# Patient Record
Sex: Female | Born: 1940 | Race: Black or African American | Hispanic: No | Smoking: Current some day smoker
Health system: Southern US, Community
[De-identification: ages and names within clinical notes are randomized; demographics above are authoritative.]

## PROBLEM LIST (undated history)

## (undated) DIAGNOSIS — L97509 Non-pressure chronic ulcer of other part of unspecified foot with unspecified severity: Secondary | ICD-10-CM

## (undated) DIAGNOSIS — I10 Essential (primary) hypertension: Secondary | ICD-10-CM

## (undated) DIAGNOSIS — E11621 Type 2 diabetes mellitus with foot ulcer: Secondary | ICD-10-CM

## (undated) DIAGNOSIS — I739 Peripheral vascular disease, unspecified: Secondary | ICD-10-CM

## (undated) DIAGNOSIS — M1990 Unspecified osteoarthritis, unspecified site: Secondary | ICD-10-CM

## (undated) DIAGNOSIS — E119 Type 2 diabetes mellitus without complications: Secondary | ICD-10-CM

## (undated) HISTORY — PX: FOOT SURGERY: SHX648

---

## 2006-10-11 ENCOUNTER — Emergency Department (HOSPITAL_COMMUNITY): Admission: EM | Admit: 2006-10-11 | Discharge: 2006-10-11 | Payer: Self-pay | Admitting: Emergency Medicine

## 2006-12-28 ENCOUNTER — Emergency Department (HOSPITAL_COMMUNITY): Admission: EM | Admit: 2006-12-28 | Discharge: 2006-12-28 | Payer: Self-pay | Admitting: Emergency Medicine

## 2007-06-02 ENCOUNTER — Ambulatory Visit (HOSPITAL_COMMUNITY): Admission: RE | Admit: 2007-06-02 | Discharge: 2007-06-02 | Payer: Self-pay | Admitting: Family Medicine

## 2008-07-16 ENCOUNTER — Ambulatory Visit (HOSPITAL_COMMUNITY): Admission: RE | Admit: 2008-07-16 | Discharge: 2008-07-16 | Payer: Self-pay | Admitting: Family Medicine

## 2008-09-04 ENCOUNTER — Emergency Department (HOSPITAL_COMMUNITY): Admission: EM | Admit: 2008-09-04 | Discharge: 2008-09-04 | Payer: Self-pay | Admitting: Emergency Medicine

## 2009-03-08 ENCOUNTER — Emergency Department (HOSPITAL_COMMUNITY): Admission: EM | Admit: 2009-03-08 | Discharge: 2009-03-08 | Payer: Self-pay | Admitting: Emergency Medicine

## 2010-03-03 ENCOUNTER — Emergency Department (HOSPITAL_COMMUNITY)
Admission: EM | Admit: 2010-03-03 | Discharge: 2010-03-03 | Payer: Self-pay | Source: Home / Self Care | Admitting: Emergency Medicine

## 2010-11-23 LAB — URINALYSIS, ROUTINE W REFLEX MICROSCOPIC
Glucose, UA: NEGATIVE mg/dL
Leukocytes, UA: NEGATIVE
Nitrite: NEGATIVE
Protein, ur: 100 mg/dL — AB
Specific Gravity, Urine: 1.02 (ref 1.005–1.030)
Urobilinogen, UA: 0.2 mg/dL (ref 0.0–1.0)

## 2010-11-23 LAB — BASIC METABOLIC PANEL
BUN: 15 mg/dL (ref 6–23)
Calcium: 8.7 mg/dL (ref 8.4–10.5)
Chloride: 108 mEq/L (ref 96–112)
Creatinine, Ser: 2 mg/dL — ABNORMAL HIGH (ref 0.4–1.2)
GFR calc Af Amer: 30 mL/min — ABNORMAL LOW (ref >60)
GFR calc non Af Amer: 25 mL/min — ABNORMAL LOW (ref >60)
Sodium: 142 mEq/L (ref 135–145)

## 2010-11-23 LAB — CBC
Hemoglobin: 11 g/dL — ABNORMAL LOW (ref 12.0–15.0)
MCH: 29.1 pg (ref 26.0–34.0)
Platelets: 204 10*3/uL (ref 150–400)
RBC: 3.77 MIL/uL — ABNORMAL LOW (ref 3.87–5.11)
WBC: 8.3 10*3/uL (ref 4.0–10.5)

## 2010-11-23 LAB — HEPATIC FUNCTION PANEL
ALT: 27 U/L (ref 0–35)
Albumin: 3.2 g/dL — ABNORMAL LOW (ref 3.5–5.2)
Alkaline Phosphatase: 172 U/L — ABNORMAL HIGH (ref 39–117)
Total Bilirubin: 0.7 mg/dL (ref 0.3–1.2)

## 2010-11-23 LAB — DIFFERENTIAL
Eosinophils Absolute: 0.1 10*3/uL (ref 0.0–0.7)
Eosinophils Relative: 1 % (ref 0–5)
Monocytes Absolute: 0.5 10*3/uL (ref 0.1–1.0)
Neutro Abs: 4.9 10*3/uL (ref 1.7–7.7)
Neutrophils Relative %: 59 % (ref 43–77)

## 2010-11-23 LAB — URINE MICROSCOPIC-ADD ON

## 2010-12-14 LAB — URINALYSIS, ROUTINE W REFLEX MICROSCOPIC
Bilirubin Urine: NEGATIVE
Protein, ur: 100 mg/dL — AB
Specific Gravity, Urine: 1.02 (ref 1.005–1.030)
Urobilinogen, UA: 0.2 mg/dL (ref 0.0–1.0)
pH: 6.5 (ref 5.0–8.0)

## 2010-12-14 LAB — URINE MICROSCOPIC-ADD ON

## 2011-06-12 LAB — URINE MICROSCOPIC-ADD ON

## 2011-06-12 LAB — COMPREHENSIVE METABOLIC PANEL
ALT: 19 U/L (ref 0–35)
Albumin: 3.6 g/dL (ref 3.5–5.2)
Calcium: 9.1 mg/dL (ref 8.4–10.5)
Chloride: 92 mEq/L — ABNORMAL LOW (ref 96–112)
GFR calc Af Amer: 28 mL/min — ABNORMAL LOW (ref >60)
Total Bilirubin: 0.7 mg/dL (ref 0.3–1.2)
Total Protein: 7.4 g/dL (ref 6.0–8.3)

## 2011-06-12 LAB — URINALYSIS, ROUTINE W REFLEX MICROSCOPIC
Bilirubin Urine: NEGATIVE
Ketones, ur: NEGATIVE mg/dL
Leukocytes, UA: NEGATIVE
Nitrite: NEGATIVE
Specific Gravity, Urine: >1.03 — ABNORMAL HIGH (ref 1.005–1.030)
Urobilinogen, UA: 0.2 mg/dL (ref 0.0–1.0)
pH: 5.5 (ref 5.0–8.0)

## 2011-06-12 LAB — DIFFERENTIAL
Lymphocytes Relative: 22 % (ref 12–46)
Monocytes Absolute: 1 10*3/uL (ref 0.1–1.0)
Neutro Abs: 12.2 10*3/uL — ABNORMAL HIGH (ref 1.7–7.7)
Neutrophils Relative %: 72 % (ref 43–77)

## 2011-06-12 LAB — CBC
HCT: 44.1 % (ref 36.0–46.0)
MCHC: 32.2 g/dL (ref 30.0–36.0)
Platelets: 224 10*3/uL (ref 150–400)
RBC: 4.87 MIL/uL (ref 3.87–5.11)
RDW: 14.8 % (ref 11.5–15.5)

## 2011-06-12 LAB — LIPASE, BLOOD: Lipase: 19 U/L (ref 11–59)

## 2012-11-02 ENCOUNTER — Other Ambulatory Visit: Payer: Self-pay | Admitting: Internal Medicine

## 2012-11-02 DIAGNOSIS — Z1231 Encounter for screening mammogram for malignant neoplasm of breast: Secondary | ICD-10-CM

## 2012-11-29 ENCOUNTER — Ambulatory Visit: Payer: Self-pay

## 2013-04-18 ENCOUNTER — Emergency Department (HOSPITAL_COMMUNITY)
Admission: EM | Admit: 2013-04-18 | Discharge: 2013-04-18 | Disposition: A | Discharge status: Home / Self Care | Payer: Medicare HMO | Attending: Emergency Medicine | Admitting: Emergency Medicine

## 2013-04-18 ENCOUNTER — Encounter (HOSPITAL_COMMUNITY): Payer: Self-pay | Admitting: Nurse Practitioner

## 2013-04-18 DIAGNOSIS — E1169 Type 2 diabetes mellitus with other specified complication: Secondary | ICD-10-CM | POA: Insufficient documentation

## 2013-04-18 DIAGNOSIS — E11621 Type 2 diabetes mellitus with foot ulcer: Secondary | ICD-10-CM

## 2013-04-18 DIAGNOSIS — F17200 Nicotine dependence, unspecified, uncomplicated: Secondary | ICD-10-CM | POA: Insufficient documentation

## 2013-04-18 DIAGNOSIS — G8929 Other chronic pain: Secondary | ICD-10-CM | POA: Insufficient documentation

## 2013-04-18 DIAGNOSIS — L97509 Non-pressure chronic ulcer of other part of unspecified foot with unspecified severity: Secondary | ICD-10-CM | POA: Insufficient documentation

## 2013-04-18 DIAGNOSIS — Z79899 Other long term (current) drug therapy: Secondary | ICD-10-CM | POA: Insufficient documentation

## 2013-04-18 HISTORY — DX: Type 2 diabetes mellitus without complications: E11.9

## 2013-04-18 MED ORDER — HYDROCODONE-ACETAMINOPHEN 5-325 MG PO TABS
2.0000 | ORAL_TABLET | Freq: Once | ORAL | Status: AC
  Administered 2013-04-18: 2 via ORAL
  Filled 2013-04-18: qty 2

## 2013-04-18 MED ORDER — HYDROCODONE-ACETAMINOPHEN 5-325 MG PO TABS: 1.0000 | ORAL_TABLET | ORAL | Status: DC | PRN

## 2013-04-18 MED ORDER — CLINDAMYCIN HCL 150 MG PO CAPS: 450.0000 mg | ORAL_CAPSULE | Freq: Three times a day (TID) | ORAL | Status: DC

## 2013-04-18 NOTE — ED Provider Notes (Signed)
CSN: [id number]     Arrival date & time 04/18/13  1401 History  This chart was scribed for non-physician practitioner, Abigail Butts, PA-C working with Stephanie Shipper, MD by Frederich Balding, ED scribe. This patient was seen in room TR08C/TR08C and the patient's care was started at 3:14 PM.   Chief Complaint  Patient presents with  . Foot Pain   The history is provided by the patient. No language interpreter was used.    HPI Comments: Stephanie Olson is a 72 y.o. female with h/o chronic foot pain who presents to the Emergency Department complaining of gradual onset, constant right foot pain that started 2 days ago. Pt also has a draining chronic wound on her right foot with associated mild swelling. Pt [facility]. She has run out of her medication for her chronic pain and normally goes there but they told her that they can't see her anymore. She states she soaked her foot in Epson salt baths with no relief. Pt states she has no other associated symptoms. Pt states she had foot surgery in 1990 and it has had the black coloring on it since then.    Past Medical History  Diagnosis Date  . Diabetes mellitus without complication    Past Surgical History  Procedure Laterality Date  . Foot surgery     History reviewed. No pertinent family history. History  Substance Use Topics  . Smoking status: Current Every Day Smoker    Types: Cigarettes  . Smokeless tobacco: Not on file  . Alcohol Use: No   OB History   Grav Para Term Preterm Abortions TAB SAB Ect Mult Living                 Review of Systems  Constitutional: Negative for fever, diaphoresis, appetite change, fatigue and unexpected weight change.  HENT: Negative for mouth sores and neck stiffness.   Eyes: Negative for visual disturbance.  Respiratory: Negative for cough, chest tightness, shortness of breath and wheezing.   Cardiovascular: Negative for chest pain.  Gastrointestinal: Negative for  nausea, vomiting, abdominal pain, diarrhea and constipation.  Endocrine: Negative for polydipsia, polyphagia and polyuria.  Genitourinary: Negative for dysuria, urgency, frequency and hematuria.  Musculoskeletal: Negative for back pain, joint swelling and arthralgias.  Skin: Positive for wound. Negative for rash.  Allergic/Immunologic: Negative for immunocompromised state.  Neurological: Negative for syncope, light-headedness and headaches.  Hematological: Does not bruise/bleed easily.  Psychiatric/Behavioral: Negative for sleep disturbance. The patient is not nervous/anxious.   All other systems reviewed and are negative.    Allergies  Review of patient's allergies indicates no known allergies.  Home Medications   Current Outpatient Rx  Name  Route  Sig  Dispense  Refill  . ALPRAZolam (XANAX) 0.5 MG tablet   Oral   Take 0.5 mg by mouth 2 (two) times daily.         Marland Kitchen lisinopril (PRINIVIL,ZESTRIL) 10 MG tablet   Oral   Take 10 mg by mouth daily.         Marland Kitchen omeprazole (PRILOSEC) 20 MG capsule   Oral   Take 20 mg by mouth daily.         . pravastatin (PRAVACHOL) 40 MG tablet   Oral   Take 40 mg by mouth daily.         . clindamycin (CLEOCIN) 150 MG capsule   Oral   Take 3 capsules (450 mg total) by mouth 3 (three) times daily.  90 capsule   0   . HYDROcodone-acetaminophen (NORCO/VICODIN) 5-325 MG per tablet   Oral   Take 1 tablet by mouth every 4 (four) hours as needed for pain.   15 tablet   0    BP 148/91  Pulse 104  Temp(Src) 98.3 F (36.8 C) (Oral)  Resp 17  Ht 5\' 9"  (1.753 m)  Wt 180 lb (81.647 kg)  BMI 26.57 kg/m2  SpO2 96%  Physical Exam  Nursing note and vitals reviewed. Constitutional: She appears well-developed and well-nourished. No distress.  Awake, alert, nontoxic appearance  HENT:  Head: Normocephalic and atraumatic.  Mouth/Throat: Oropharynx is clear and moist. No oropharyngeal exudate.  Eyes: Conjunctivae are normal. No scleral  icterus.  Neck: Normal range of motion. Neck supple.  Cardiovascular: Normal rate, regular rhythm, normal heart sounds and intact distal pulses.   No murmur heard. Pulmonary/Chest: Effort normal and breath sounds normal. No respiratory distress. She has no wheezes.  Abdominal: Soft. Bowel sounds are normal. She exhibits no mass. There is no tenderness. There is no rebound and no guarding.  Musculoskeletal: Normal range of motion. She exhibits no edema.  Neurological: She is alert.  Speech is clear and goal oriented Moves extremities without ataxia  Skin: Skin is warm and dry. She is not diaphoretic.  15 cm x 20 cm area eschar. 3 cm x 3 cm area of ulceration with clean base. No bleeding present.   Psychiatric: She has a normal mood and affect.    ED Course   Procedures (including critical care time)  DIAGNOSTIC STUDIES: Oxygen Saturation is 96% on RA, normal by my interpretation.    COORDINATION OF CARE: 3:36 PM-Discussed treatment plan which includes refilling some of her medications with pt at bedside and pt agreed to plan. Advised pt to find a PCP and go to the wound clinic to get her foot looked at.   Labs Reviewed - No data to display No results found. 1. Chronic diabetic ulcer of right foot determined by examination     MDM  Stephanie Olson presents with chronic diabetic foot ulcer which is causing her pain today.  Pt's main concern is that the UC where she receives pain medication.  Pt wound does appear chronic and does not currently appear infected, but is open and draining.  Will give clindamycin and refer to wound clinic.  Pt has also been instructed to use the resource guide to find a PCP.  Pt is alert, nontoxic, nonseptic appearing.  Pt tachycardic at triage, but with normal HR on exam.    I explained the diagnosis and have given explicit precautions to return to the ER including for any other new or worsening symptoms. The patient understands and accepts the medical plan  as it's been dictated and I have answered their questions. Discharge instructions concerning home care and prescriptions have been given. The patient is STABLE and is discharged to home in good condition.    [name], PA-C 04/18/13 [place], PA-C 04/18/13 2212

## 2013-04-18 NOTE — ED Notes (Signed)
Pt reports she has run out of pain medication for her chronic foot pain and normally goes to [facility] but they said they cant see her anymore.

## 2013-04-19 NOTE — ED Provider Notes (Signed)
Medical screening examination/treatment/procedure(s) were performed by non-physician practitioner and as supervising physician I was immediately available for consultation/collaboration.   Osvaldo Shipper, MD 04/19/13 [phone]

## 2013-05-10 ENCOUNTER — Encounter (HOSPITAL_BASED_OUTPATIENT_CLINIC_OR_DEPARTMENT_OTHER): Payer: Medicare HMO

## 2013-08-17 ENCOUNTER — Ambulatory Visit: Payer: Medicare HMO | Admitting: Internal Medicine

## 2013-10-25 ENCOUNTER — Encounter (HOSPITAL_BASED_OUTPATIENT_CLINIC_OR_DEPARTMENT_OTHER): Payer: Medicare HMO | Attending: General Surgery

## 2013-11-10 ENCOUNTER — Encounter (HOSPITAL_BASED_OUTPATIENT_CLINIC_OR_DEPARTMENT_OTHER): Payer: Medicare HMO | Attending: General Surgery

## 2013-11-10 DIAGNOSIS — J449 Chronic obstructive pulmonary disease, unspecified: Secondary | ICD-10-CM | POA: Insufficient documentation

## 2013-11-10 DIAGNOSIS — L97509 Non-pressure chronic ulcer of other part of unspecified foot with unspecified severity: Secondary | ICD-10-CM | POA: Insufficient documentation

## 2013-11-10 DIAGNOSIS — J4489 Other specified chronic obstructive pulmonary disease: Secondary | ICD-10-CM | POA: Insufficient documentation

## 2013-11-10 DIAGNOSIS — E1169 Type 2 diabetes mellitus with other specified complication: Secondary | ICD-10-CM | POA: Insufficient documentation

## 2013-11-10 DIAGNOSIS — I10 Essential (primary) hypertension: Secondary | ICD-10-CM | POA: Insufficient documentation

## 2013-11-11 NOTE — Progress Notes (Signed)
[facility]  NAME:  Stephanie, Olson NO.:  [id number]  MEDICAL RECORD NO.:  [id number]      DATE OF BIRTH:  [birth year]  PHYSICIAN:  Judene Companion, M.D.           VISIT DATE:                                  OFFICE VISIT   HISTORY:  [name] is a 73 year old female, who comes to [place] with a diabetic foot ulcer on her right foot.  It is about 1 cm in diameter. It is classified as a Wagner 2.  This lady has a history of hypertension in the past and she is on lisinopril for this.  She also has a history of asthma, type 2 diabetes, and also has a history of tobacco abuse.  Her medications include lisinopril, Xanax, Prilosec, Lantus insulin, and Advair.  She has had a similar problem in the past, which required a skin graft to her right lateral ankle about 10 years ago.  Today, I debrided callus from the ulcer and we are going to treat it with silver collagen and we also cut some felt strips to offload so that this would help this ulcer to epithelialize.  She will come back here in a week and we gave her enough of the collagen to change the dressing every other day.  Her vital signs when she was here showed a blood pressure of 143/88, respirations 18, temperature 97, and she weighs 180 pounds.  Her blood glucose was 125 today.  DIAGNOSIS:  Diabetic ulcer Wagner 2, right foot; type 2 diabetes; hypertension; and chronic obstructive pulmonary disease.     Judene Companion, M.D.     PP/MEDQ  D:  11/10/2013  T:  11/11/2013  Job:  [id number]

## 2013-11-24 LAB — GLUCOSE, CAPILLARY: Glucose-Capillary: 88 mg/dL (ref 70–99)

## 2014-12-23 ENCOUNTER — Encounter (HOSPITAL_COMMUNITY): Payer: Self-pay | Admitting: Emergency Medicine

## 2014-12-23 ENCOUNTER — Emergency Department (HOSPITAL_COMMUNITY): Payer: Medicare HMO

## 2014-12-23 ENCOUNTER — Emergency Department (HOSPITAL_COMMUNITY)
Admission: EM | Admit: 2014-12-23 | Discharge: 2014-12-23 | Disposition: A | Discharge status: Other Acute Inpatient Hospital | Payer: Medicare HMO | Source: Home / Self Care | Attending: Family Medicine | Admitting: Family Medicine

## 2014-12-23 ENCOUNTER — Encounter (HOSPITAL_COMMUNITY): Payer: Self-pay | Admitting: *Deleted

## 2014-12-23 ENCOUNTER — Emergency Department (HOSPITAL_COMMUNITY)
Admission: EM | Admit: 2014-12-23 | Discharge: 2014-12-23 | Disposition: A | Discharge status: Home / Self Care | Payer: Medicare HMO | Attending: Emergency Medicine | Admitting: Emergency Medicine

## 2014-12-23 DIAGNOSIS — E11621 Type 2 diabetes mellitus with foot ulcer: Secondary | ICD-10-CM | POA: Diagnosis present

## 2014-12-23 DIAGNOSIS — Z720 Tobacco use: Secondary | ICD-10-CM | POA: Insufficient documentation

## 2014-12-23 DIAGNOSIS — Z79899 Other long term (current) drug therapy: Secondary | ICD-10-CM | POA: Insufficient documentation

## 2014-12-23 DIAGNOSIS — L97419 Non-pressure chronic ulcer of right heel and midfoot with unspecified severity: Secondary | ICD-10-CM | POA: Insufficient documentation

## 2014-12-23 DIAGNOSIS — L97519 Non-pressure chronic ulcer of other part of right foot with unspecified severity: Secondary | ICD-10-CM

## 2014-12-23 DIAGNOSIS — Z792 Long term (current) use of antibiotics: Secondary | ICD-10-CM | POA: Insufficient documentation

## 2014-12-23 DIAGNOSIS — L97512 Non-pressure chronic ulcer of other part of right foot with fat layer exposed: Secondary | ICD-10-CM | POA: Diagnosis not present

## 2014-12-23 LAB — CBC WITH DIFFERENTIAL/PLATELET
Basophils Absolute: 0.1 10*3/uL (ref 0.0–0.1)
Basophils Relative: 1 % (ref 0–1)
EOS PCT: 2 % (ref 0–5)
Eosinophils Absolute: 0.2 10*3/uL (ref 0.0–0.7)
HCT: 40.8 % (ref 36.0–46.0)
HEMOGLOBIN: 12.8 g/dL (ref 12.0–15.0)
LYMPHS ABS: 3.8 10*3/uL (ref 0.7–4.0)
Lymphocytes Relative: 44 % (ref 12–46)
MCH: 28.3 pg (ref 26.0–34.0)
MCHC: 31.4 g/dL (ref 30.0–36.0)
MCV: 90.3 fL (ref 78.0–100.0)
Monocytes Absolute: 0.7 10*3/uL (ref 0.1–1.0)
Monocytes Relative: 8 % (ref 3–12)
Neutro Abs: 3.9 10*3/uL (ref 1.7–7.7)
Neutrophils Relative %: 45 % (ref 43–77)
Platelets: 246 10*3/uL (ref 150–400)
RBC: 4.52 MIL/uL (ref 3.87–5.11)
RDW: 16.3 % — ABNORMAL HIGH (ref 11.5–15.5)
WBC: 8.6 10*3/uL (ref 4.0–10.5)

## 2014-12-23 LAB — BASIC METABOLIC PANEL
ANION GAP: 11 (ref 5–15)
BUN: 17 mg/dL (ref 6–23)
CO2: 19 mmol/L (ref 19–32)
Calcium: 8.8 mg/dL (ref 8.4–10.5)
Chloride: 106 mmol/L (ref 96–112)
Creatinine, Ser: 1.71 mg/dL — ABNORMAL HIGH (ref 0.50–1.10)
GFR calc non Af Amer: 29 mL/min — ABNORMAL LOW (ref >90)
GFR, EST AFRICAN AMERICAN: 33 mL/min — AB (ref >90)
Glucose, Bld: 69 mg/dL — ABNORMAL LOW (ref 70–99)
Potassium: 4.8 mmol/L (ref 3.5–5.1)
SODIUM: 136 mmol/L (ref 135–145)

## 2014-12-23 MED ORDER — CLINDAMYCIN HCL 150 MG PO CAPS: 300.0000 mg | ORAL_CAPSULE | Freq: Four times a day (QID) | ORAL | Status: DC

## 2014-12-23 MED ORDER — CLINDAMYCIN HCL 300 MG PO CAPS
300.0000 mg | ORAL_CAPSULE | Freq: Once | ORAL | Status: AC
  Administered 2014-12-23: 300 mg via ORAL
  Filled 2014-12-23: qty 1

## 2014-12-23 MED ORDER — VANCOMYCIN HCL IN DEXTROSE 1-5 GM/200ML-% IV SOLN
1000.0000 mg | INTRAVENOUS | Status: DC
  Filled 2014-12-23: qty 200

## 2014-12-23 NOTE — Progress Notes (Signed)
12/23/2014 1755 Attempted call to pt's number to arrange [facility] # [phone]. Number disconnected. Attempted call to son's number, Lonzo Cloud [phone] and number is non-working number. Attemtped call to son, Montel Clock (Son)# [phone], does not have voice mail. Will try to reach pt on 4/18 to arrange [facility] Jonnie Finner RN CCM Case Mgmt phone [phone]

## 2014-12-23 NOTE — Progress Notes (Signed)
Received request from emergency room to consider patient for admission. Chart reviewed including EDP note with extensive photographs of patient's foot wounds. Laboratory data and x-ray data also reviewed. Discussed at length with my physician who also reviewed the chart. Felt at this juncture patient did not meet criteria for acute admission and called back emergency room physician in charge and recommended orthopedic review/evaluation to determine if orthopedic specific inpatient stay required or if this long-standing chronic wound could be managed in the outpatient setting and therefore allow patient to establish with an orthopedic physician since in review of the chart it appears she has not seen anyone at the wound clinic since March 2015. Patient's current diabetes is under control and no acute internal medicine reason as well to admit the patient.  Erin Hearing, ANP

## 2014-12-23 NOTE — Discharge Instructions (Signed)
Please read and follow all provided instructions.  Your diagnoses today include:  1. Diabetic ulcer of right foot associated with type 2 diabetes mellitus    Tests performed today include:  Blood counts and electrolytes - are okay  X-ray of foot - shows signs of infection in the skin, does not show definite infection in the bone, also hints at severe disease of the blood vessels in your lower leg  Vital signs. See below for your results today.   Medications prescribed:   Clindamycin - antibiotic  You have been prescribed an antibiotic medicine: take the entire course of medicine even if you are feeling better. Stopping early can cause the antibiotic not to work.  Take any prescribed medications only as directed.  Home care instructions:  Follow any educational materials contained in this packet.  Our nurse care manager is assisting you in having home health set up for you to manage your wounds.   Follow-up instructions: Please follow-up with your primary care provider in the next 3 days for further evaluation of your symptoms. Please have them assit you in getting set up with wound care.   You will also need to see a vascular surgeon doctor to evaluate the blood vessels in your legs to make sure your foot is getting enough blood. Please call Dr. Stephens Shire office and have an appointment scheduled.   Return instructions:   Please return to the Emergency Department if you experience worsening symptoms.   Return with worsening redness, drainage from the wound, or fever.   Please return if you have any other emergent concerns.  Additional Information:  Your vital signs today were: BP 129/72 mmHg   Pulse 84   Temp(Src) 98.6 F (37 C) (Oral)   Resp 18   Ht 5\' 9"  (1.753 m)   Wt 188 lb (85.276 kg)   BMI 27.75 kg/m2   SpO2 100% If your blood pressure (BP) was elevated above 135/85 this visit, please have this repeated by your doctor within one month. --------------

## 2014-12-23 NOTE — ED Notes (Signed)
Pt reports foot ulcer to right foot, sees a wound care nurse for this. Pt reports yesterday it got worse. Malodorous. Pt reports she has only been eating ice cream. Pt also states she has been soaking it in epsom salts and alcohol.

## 2014-12-23 NOTE — ED Notes (Signed)
Waiting on med from pharmacy 

## 2014-12-23 NOTE — ED Provider Notes (Signed)
CSN: [id number]     Arrival date & time 12/23/14  1016 History   First MD Initiated Contact with Patient 12/23/14 1036     Chief Complaint  Patient presents with  . Foot Ulcer     (Consider location/radiation/quality/duration/timing/severity/associated sxs/prior Treatment) HPI Comments: Patient with history of diabetes - presents with complaint of right foot ulcerations worsening over the past 4 months, but with acutely worsening pain and malodorous discharge recently. Patient was being seen for this 4 months ago at the wound care clinic (per family although no notes to support this, most previous note is actually from 11/2013, wound was 1cm at that time). She has not had any care for the wound since that time. History of remote skin grafting in this area. Family noticed the extent of the wound last night and encouraged her to go to urgent care today. Patient was then transferred to the emergency department for further evaluation. Patient denies fever, nausea or vomiting. She has decreased appetite. She otherwise denies medical complaints. She states that her foot is tender. She continues to ambulate on the foot. She has kept it wrapped at home. No treatments other than soaking in Epsom salts and alcohol. There has been some drainage.  The history is provided by the patient.    Past Medical History  Diagnosis Date  . Diabetes mellitus without complication    Past Surgical History  Procedure Laterality Date  . Foot surgery     No family history on file. History  Substance Use Topics  . Smoking status: Current Every Day Smoker    Types: Cigarettes  . Smokeless tobacco: Not on file  . Alcohol Use: No   OB History    No data available     Review of Systems  Constitutional: Positive for appetite change. Negative for fever.  HENT: Negative for rhinorrhea and sore throat.   Eyes: Negative for redness.  Respiratory: Negative for cough.   Cardiovascular: Negative for chest pain.    Gastrointestinal: Negative for nausea, vomiting, abdominal pain and diarrhea.  Genitourinary: Negative for dysuria.  Musculoskeletal: Positive for myalgias.  Skin: Positive for color change and wound. Negative for rash.  Neurological: Negative for headaches.      Allergies  Review of patient's allergies indicates no known allergies.  Home Medications   Prior to Admission medications   Medication Sig Start Date End Date Taking? Authorizing Provider  ALPRAZolam Duanne Moron) 0.5 MG tablet Take 0.5 mg by mouth 2 (two) times daily.    Historical Provider, MD  clindamycin (CLEOCIN) 150 MG capsule Take 3 capsules (450 mg total) by mouth 3 (three) times daily. 04/18/13   Hannah Muthersbaugh, PA-C  HYDROcodone-acetaminophen (NORCO/VICODIN) 5-325 MG per tablet Take 1 tablet by mouth every 4 (four) hours as needed for pain. 04/18/13   Hannah Muthersbaugh, PA-C  lisinopril (PRINIVIL,ZESTRIL) 10 MG tablet Take 10 mg by mouth daily.    Historical Provider, MD  omeprazole (PRILOSEC) 20 MG capsule Take 20 mg by mouth daily.    Historical Provider, MD  pravastatin (PRAVACHOL) 40 MG tablet Take 40 mg by mouth daily.    Historical Provider, MD   BP 118/89 mmHg  Pulse 94  Temp(Src) 98.6 F (37 C) (Oral)  Resp 18  Ht 5\' 9"  (1.753 m)  Wt 188 lb (85.276 kg)  BMI 27.75 kg/m2  SpO2 99%   Physical Exam  Constitutional: She appears well-developed and well-nourished.  HENT:  Head: Normocephalic and atraumatic.  Eyes: Conjunctivae are normal. Right eye exhibits  no discharge. Left eye exhibits no discharge.  Neck: Normal range of motion. Neck supple.  Cardiovascular: Normal rate, regular rhythm and normal heart sounds.   Pulmonary/Chest: Effort normal and breath sounds normal.  Abdominal: Soft. There is no tenderness.  Neurological: She is alert.  Skin: Skin is warm and dry.  Large triangular shaped ulceration through the subcutaneous skin and fat into the lateral aspect of the right foot. Malodorous. There  is also a smaller ulceration into the subcutaneous tissue on the sole at the base of 4th and 5th toes. See pictures. No active drainage however there is crusting suggestive of recent purulent drainage. Surrounding areas are mildly warm and erythematous.  Psychiatric: She has a normal mood and affect.  Nursing note and vitals reviewed.              ED Course  Procedures (including critical care time) Labs Review Labs Reviewed  CBC WITH DIFFERENTIAL/PLATELET - Abnormal; Notable for the following:    RDW 16.3 (*)    All other components within normal limits  BASIC METABOLIC PANEL - Abnormal; Notable for the following:    Glucose, Bld 69 (*)    Creatinine, Ser 1.71 (*)    GFR calc non Af Amer 29 (*)    GFR calc Af Amer 33 (*)    All other components within normal limits    Imaging Review Dg Foot Complete Right  12/23/2014   CLINICAL DATA:  74 year old female with a history of ulcer on the plantar surface right heel.  EXAM: RIGHT FOOT COMPLETE - 3+ VIEW  COMPARISON:  None.  FINDINGS: No acute fracture line identified.  Soft tissue swelling with subcutaneous gas along the lateral aspect of the fifth metatarsal. No bony erosion or destructive changes.  Osteopenia.  Degenerative changes of the midfoot, interphalangeal joints, and hindfoot. Calcifications of the posterior tibial artery.  IMPRESSION: No acute bony abnormality, with no erosive changes of the fifth metatarsal to suggest osteomyelitis.  Soft tissue swelling and subcutaneous gas overlying the fifth metatarsal, compatible with history of soft tissue wound/ulcer.  Calcifications of the posterior tibial artery, compatible with infrapopliteal disease. If the patient has not yet been evaluated for claudication/CLI, noninvasive testing with ABI, segmental duplex, and segmental pulse volume recording may be considered as well as office based evaluation to assess for wound healing capability.  Signed,  Dulcy Fanny. Earleen Newport, DO  [facility]  [facility]   Electronically Signed   By: Corrie Mckusick D.O.   On: 12/23/2014 12:20     EKG Interpretation None      11:07 AM Patient seen and examined. Work-up initiated. Discussed with Dr. Mingo Amber who will see.  Vital signs reviewed and are as follows: BP 118/89 mmHg  Pulse 94  Temp(Src) 98.6 F (37 C) (Oral)  Resp 18  Ht 5\' 9"  (1.753 m)  Wt 188 lb (85.276 kg)  BMI 27.75 kg/m2  SpO2 99%  12:51 PM Feel consult for admission is best plan of action to receive IV abx, PAD work-up, re-start wound care. Patient does not reliably follow-up with PCP.   1:16 PM Discussed patient with [facility]. They do not feel that this patient has indications for inpatient treatment. Will start on clinda. Will ask case manager for assistance.   2:18 PM Case manager has seen. Arrangements made for home health for assistance with wound care. Case manager will notify PCP of situation. Will d/c to home on clinda. I will provide referral for  vascular surgery and strongly encourage follow-up with them as well as PCP. Case manager to also contact wound care center on behalf of patient.    MDM   Final diagnoses:  Diabetic ulcer of right foot associated with type 2 diabetes mellitus   Patient with chronic diabetic ulcer. Outpatient follow-up, home health, vascular surgery referral as described as above. If she is compliant, she should be able to have her needs addressed as an outpatient. After discussed with hospitalist, no indications for acute inpatient admission identified. Do not feel that urgent orthopedic consultation is necessary at this time. No indication of acute severe infection but will cover with clindamycin given possible purulent drainage while wound care plans and close follow-up established.     Carlisle Cater, PA-C 12/23/14 [place], MD 12/24/14 [phone]

## 2014-12-23 NOTE — ED Notes (Signed)
Pt  Is  A  Diabetic    Takes  Insulin      She  Has  Skin  Breakdown  And  Ulceration  Of the  r  Foot      For  About  1  Month      She  Is  Awake  And  Alert  And  Oriented     Skin is  Warm  And  Dry   Pt  Is  Sitting  Upright on the  Exam table  Speaking in  Complete  sentances

## 2014-12-23 NOTE — ED Provider Notes (Addendum)
CSN: [id number]     Arrival date & time 12/23/14  0912 History   First MD Initiated Contact with Patient 12/23/14 [phone]     Chief Complaint  Patient presents with  . Foot Problem   (Consider location/radiation/quality/duration/timing/severity/associated sxs/prior Treatment) Patient is a 74 y.o. female presenting with lower extremity pain. The history is provided by the patient and a relative.  Foot Pain This is a chronic problem. The current episode started more than 1 week ago (has had treatments on right foot prev, worsening skin breakdown.). The problem has been gradually worsening. The symptoms are aggravated by smoking.    Past Medical History  Diagnosis Date  . Diabetes mellitus without complication    Past Surgical History  Procedure Laterality Date  . Foot surgery     History reviewed. No pertinent family history. History  Substance Use Topics  . Smoking status: Current Every Day Smoker    Types: Cigarettes  . Smokeless tobacco: Not on file  . Alcohol Use: No   OB History    No data available     Review of Systems  Constitutional: Negative.   Musculoskeletal: Positive for gait problem.  Skin: Positive for wound.    Allergies  Review of patient's allergies indicates no known allergies.  Home Medications   Prior to Admission medications   Medication Sig Start Date End Date Taking? Authorizing Provider  ALPRAZolam Duanne Moron) 0.5 MG tablet Take 0.5 mg by mouth 2 (two) times daily.    Historical Provider, MD  clindamycin (CLEOCIN) 150 MG capsule Take 3 capsules (450 mg total) by mouth 3 (three) times daily. 04/18/13   Hannah Muthersbaugh, PA-C  HYDROcodone-acetaminophen (NORCO/VICODIN) 5-325 MG per tablet Take 1 tablet by mouth every 4 (four) hours as needed for pain. 04/18/13   Hannah Muthersbaugh, PA-C  lisinopril (PRINIVIL,ZESTRIL) 10 MG tablet Take 10 mg by mouth daily.    Historical Provider, MD  omeprazole (PRILOSEC) 20 MG capsule Take 20 mg by mouth daily.     Historical Provider, MD  pravastatin (PRAVACHOL) 40 MG tablet Take 40 mg by mouth daily.    Historical Provider, MD   BP 144/88 mmHg  Pulse 88  Temp(Src) 98.5 F (36.9 C) (Oral)  Resp 18  SpO2 97% Physical Exam  Constitutional: She is oriented to person, place, and time. She appears well-developed and well-nourished. No distress.  Musculoskeletal: She exhibits tenderness.  Neurological: She is alert and oriented to person, place, and time.  Skin:  Open exposed necrotic ulceration on right lat foot, tender, blackened.  Nursing note and vitals reviewed.   ED Course  Procedures (including critical care time) Labs Review Labs Reviewed - No data to display  Imaging Review No results found.   MDM   1. Foot ulcer, right, with fat layer exposed    Sent for chronic foot ulcer and prob osteomyelitis, worsening breakdown.    Billy Fischer, MD 12/23/14 [place], MD 12/23/14 [phone]

## 2014-12-23 NOTE — Progress Notes (Signed)
ANTIBIOTIC CONSULT NOTE - INITIAL  Pharmacy Consult for vancomycin Indication: foot ulcer/infection  No Known Allergies  Patient Measurements: Height: 5\' 9"  (175.3 cm) Weight: 188 lb (85.276 kg) IBW/kg (Calculated) : 66.2 Adjusted Body Weight:   Vital Signs: Temp: 98.6 F (37 C) (04/17 1049) Temp Source: Oral (04/17 1049) BP: 130/82 mmHg (04/17 1252) Pulse Rate: 88 (04/17 1252) Intake/Output from previous day:   Intake/Output from this shift:    Labs:  Recent Labs  12/23/14 1055  WBC 8.6  HGB 12.8  PLT 246  CREATININE 1.71*   Estimated Creatinine Clearance: 34.6 mL/min (by C-G formula based on Cr of 1.71). No results for input(s): VANCOTROUGH, VANCOPEAK, VANCORANDOM, GENTTROUGH, GENTPEAK, GENTRANDOM, TOBRATROUGH, TOBRAPEAK, TOBRARND, AMIKACINPEAK, AMIKACINTROU, AMIKACIN in the last 72 hours.   Microbiology: No results found for this or any previous visit (from the past 720 hour(s)).  Medical History: Past Medical History  Diagnosis Date  . Diabetes mellitus without complication     Medications:  Anti-infectives    Start     Dose/Rate Route Frequency Ordered Stop   12/23/14 1300  vancomycin (VANCOCIN) IVPB 1000 mg/200 mL premix     1,000 mg 200 mL/hr over 60 Minutes Intravenous Every 24 hours 12/23/14 1257       Assessment: 60 yof with a history of DM presented to the ED with a foot ulcer. To start empiric vancomycin for treatment. Scr is elevated at 1.71. Pt s afebrile and WBC is WNL.   Vanc 4/17>>  Goal of Therapy:  Vancomycin trough level 10-15 mcg/ml  Plan:  - Vancomycin 1gm IV Q24H - F/u renal fxn, C&S, clinical status and trough at [place], Rande Lawman 12/23/2014,1:00 PM

## 2014-12-23 NOTE — ED Notes (Signed)
Patient transported to X-ray 

## 2014-12-23 NOTE — ED Notes (Signed)
Pharmacy sent second note about missing med.

## 2014-12-25 ENCOUNTER — Other Ambulatory Visit: Payer: Self-pay

## 2014-12-25 ENCOUNTER — Other Ambulatory Visit: Payer: Self-pay | Admitting: Physician Assistant

## 2014-12-25 DIAGNOSIS — L97519 Non-pressure chronic ulcer of other part of right foot with unspecified severity: Secondary | ICD-10-CM

## 2014-12-31 ENCOUNTER — Other Ambulatory Visit: Payer: Medicare HMO

## 2015-01-07 ENCOUNTER — Other Ambulatory Visit: Payer: Medicare HMO

## 2015-01-10 ENCOUNTER — Ambulatory Visit
Admission: RE | Admit: 2015-01-10 | Discharge: 2015-01-10 | Disposition: A | Discharge status: Home / Self Care | Payer: Medicare HMO | Source: Ambulatory Visit | Attending: Physician Assistant | Admitting: Physician Assistant

## 2015-01-10 DIAGNOSIS — L97519 Non-pressure chronic ulcer of other part of right foot with unspecified severity: Secondary | ICD-10-CM

## 2015-01-11 ENCOUNTER — Encounter: Payer: Self-pay | Admitting: Surgery

## 2015-01-14 ENCOUNTER — Ambulatory Visit (INDEPENDENT_AMBULATORY_CARE_PROVIDER_SITE_OTHER): Payer: Medicare HMO | Admitting: Surgery

## 2015-01-14 ENCOUNTER — Other Ambulatory Visit: Payer: Self-pay

## 2015-01-14 VITALS — BP 147/86 | HR 92 | Ht 69.0 in | Wt 185.0 lb

## 2015-01-14 DIAGNOSIS — L97519 Non-pressure chronic ulcer of other part of right foot with unspecified severity: Secondary | ICD-10-CM | POA: Diagnosis not present

## 2015-01-14 DIAGNOSIS — E11621 Type 2 diabetes mellitus with foot ulcer: Secondary | ICD-10-CM

## 2015-01-14 NOTE — Progress Notes (Signed)
Patient name: Stephanie Olson MRN: [id number] DOB: 12-Apr-1941 Sex: female   Referred by: ER  Reason for referral:  Chief Complaint  Patient presents with  . New Evaluation    HISTORY OF PRESENT ILLNESS: This is a very pleasant 74 year old female who is referred today for evaluation of a diabetic foot ulcer on the right.  The patient has a history of a skin graft to the right foot in the 1990s.  She has developed a ulcer around her skin graft which has been present for approximately 1-2 months.  She is recently seen in the emergency department and started on antibiotics.  She has been getting follow-up with the wound center.  Home health comes out and performs dressing changes 3 times a week.  She states that this area is occasionally painful.  She denies fevers or chills.  The patient is a diabetic.  She does not know her last hemoglobin A1c.  She has not been monitoring her blood sugars.  She suffers from hypercholesterolemia which is managed with a statin.  Her hypertension is treated with multiple medications including an ACE inhibitor.  She is a nonsmoker.  She recently had ultrasound studies of her legs which revealed segmental disease.  Past Medical History  Diagnosis Date  . Diabetes mellitus without complication     Past Surgical History  Procedure Laterality Date  . Foot surgery      History   Social History  . Marital Status: Divorced    Spouse Name: N/A  . Number of Children: N/A  . Years of Education: N/A   Occupational History  . Not on file.   Social History Main Topics  . Smoking status: Current Every Day Smoker    Types: Cigarettes  . Smokeless tobacco: Not on file  . Alcohol Use: No  . Drug Use: No  . Sexual Activity: Not on file   Other Topics Concern  . Not on file   Social History Narrative    No family history on file.  Allergies as of 01/14/2015  . (No Known Allergies)    Current Outpatient Prescriptions on File Prior to Visit    Medication Sig Dispense Refill  . ADVAIR DISKUS 250-50 MCG/DOSE AEPB Inhale 1 puff into the lungs daily as needed.  0  . ALPRAZolam (XANAX) 1 MG tablet Take 1 mg by mouth at bedtime as needed.    Marland Kitchen amLODipine (NORVASC) 5 MG tablet Take 5 mg by mouth daily.  0  . LANTUS 100 UNIT/ML injection Take 35 Units by mouth every morning.  0  . lisinopril (PRINIVIL,ZESTRIL) 10 MG tablet Take 10 mg by mouth daily.    . Omega-3 Fatty Acids (FISH OIL PO) Take 1 capsule by mouth daily.    Marland Kitchen omeprazole (PRILOSEC) 20 MG capsule Take 20 mg by mouth daily.    . pravastatin (PRAVACHOL) 20 MG tablet Take 20 mg by mouth at bedtime.  0  . PROAIR HFA 108 (90 BASE) MCG/ACT inhaler Inhale 2 puffs into the lungs every 4 (four) hours as needed.  0  . clindamycin (CLEOCIN) 150 MG capsule Take 2 capsules (300 mg total) by mouth every 6 (six) hours. (Patient not taking: Reported on 01/14/2015) 56 capsule 0  . VITAMIN D, ERGOCALCIFEROL, PO Take 1 capsule by mouth daily.     No current facility-administered medications on file prior to visit.     REVIEW OF SYSTEMS: Please see history of present illness for pertinent positives and negatives.  All other systems are negative.  PHYSICAL EXAMINATION:  Filed Vitals:   01/14/15 0849  BP: 147/86  Pulse: 92  Height: 5\' 9"  (1.753 m)  Weight: 185 lb (83.915 kg)  SpO2: 100%   Body mass index is 27.31 kg/(m^2). General: The patient appears their stated age.   HEENT:  No gross abnormalities Pulmonary: Respirations are non-labored Abdomen: Soft and non-tender  Musculoskeletal: There are no major deformities.   Neurologic: No focal weakness or paresthesias are detected, Skin: Large defect with no open areas on the lateral malleolus consistent with prior skin graft.  On the lateral heel there is a 2 x 2 open ulcer with granulation tissue.  No significant drainage or erythema. Psychiatric: The patient has normal affect. Cardiovascular: There is a regular rate and rhythm without  significant murmur appreciated.  Pedal pulses are not palpable.  No carotid bruit  Diagnostic Studies: I have reviewed her outside ultrasound studies.  Ankle-brachial indices were 0.89 bilaterally with segmental disease.   Assessment:  Right diabetic foot ulcer Plan: I discussed our treatment options with the patient.  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  Her wound has not healed over a two-month period.  Therefore, I think the next step is to proceed with angiography to better define her anatomy and to proceed with intervention if there is a lesion amenable to treatment.  I discussed the risks and benefits of the procedure including the risk of bleeding from access.  We also discussed the risk of distal embolization.  She understands that if we cannot get this wound to heal that she is at risk for amputation.  I have scheduled her procedure for Tuesday, May 17.  I wanted to start her on a baby aspirin today, however she states that this does not agree with her.     Eldridge Abrahams, M.D. [facility] [place] Office: [phone] Pager:  [phone]

## 2015-01-21 ENCOUNTER — Telehealth: Payer: Self-pay | Admitting: *Deleted

## 2015-01-21 NOTE — Telephone Encounter (Signed)
"   Granddaugher" called to triage re: cancelling Mrs. Lumley's AGM tomorrow with Dr. Trula Slade. I tried to call her back for more information but had to leave a message for her to call me back [phone]). I did call the patient but she seemed confused about the situation and could not answer my questions. I will keep trying to contact Toinette, the granddaughter.

## 2015-01-25 ENCOUNTER — Telehealth: Payer: Self-pay

## 2015-01-29 ENCOUNTER — Other Ambulatory Visit: Payer: Self-pay | Admitting: *Deleted

## 2015-01-29 ENCOUNTER — Encounter (HOSPITAL_COMMUNITY)
Admission: RE | Disposition: A | Discharge status: Home / Self Care | Payer: Medicare HMO | Source: Ambulatory Visit | Attending: Surgery

## 2015-01-29 ENCOUNTER — Ambulatory Visit (HOSPITAL_COMMUNITY)
Admission: RE | Admit: 2015-01-29 | Discharge: 2015-01-30 | Disposition: A | Discharge status: Home / Self Care | Payer: Medicare Other | Source: Ambulatory Visit | Attending: Surgery | Admitting: Surgery

## 2015-01-29 DIAGNOSIS — I70235 Atherosclerosis of native arteries of right leg with ulceration of other part of foot: Secondary | ICD-10-CM | POA: Diagnosis not present

## 2015-01-29 DIAGNOSIS — I70233 Atherosclerosis of native arteries of right leg with ulceration of ankle: Secondary | ICD-10-CM | POA: Diagnosis present

## 2015-01-29 DIAGNOSIS — E780 Pure hypercholesterolemia: Secondary | ICD-10-CM | POA: Diagnosis not present

## 2015-01-29 DIAGNOSIS — I10 Essential (primary) hypertension: Secondary | ICD-10-CM | POA: Insufficient documentation

## 2015-01-29 DIAGNOSIS — I739 Peripheral vascular disease, unspecified: Secondary | ICD-10-CM

## 2015-01-29 DIAGNOSIS — E11621 Type 2 diabetes mellitus with foot ulcer: Secondary | ICD-10-CM | POA: Insufficient documentation

## 2015-01-29 DIAGNOSIS — L97319 Non-pressure chronic ulcer of right ankle with unspecified severity: Secondary | ICD-10-CM | POA: Insufficient documentation

## 2015-01-29 HISTORY — PX: ATHERECTOMY: SHX47

## 2015-01-29 HISTORY — DX: Type 2 diabetes mellitus with foot ulcer: E11.621

## 2015-01-29 HISTORY — PX: PERIPHERAL VASCULAR CATHETERIZATION: SHX172C

## 2015-01-29 HISTORY — PX: ABDOMINAL AORTAGRAM: SHX5706

## 2015-01-29 HISTORY — DX: Unspecified osteoarthritis, unspecified site: M19.90

## 2015-01-29 HISTORY — PX: BALLOON ANGIOPLASTY, ARTERY: SHX564

## 2015-01-29 HISTORY — DX: Non-pressure chronic ulcer of other part of unspecified foot with unspecified severity: L97.509

## 2015-01-29 HISTORY — DX: Peripheral vascular disease, unspecified: I73.9

## 2015-01-29 HISTORY — DX: Essential (primary) hypertension: I10

## 2015-01-29 LAB — POCT I-STAT, CHEM 8
BUN: 22 mg/dL — AB (ref 6–20)
Calcium, Ion: 1.19 mmol/L (ref 1.13–1.30)
Chloride: 108 mmol/L (ref 101–111)
Creatinine, Ser: 1.6 mg/dL — ABNORMAL HIGH (ref 0.44–1.00)
GLUCOSE: 99 mg/dL (ref 65–99)
HEMATOCRIT: 41 % (ref 36.0–46.0)
HEMOGLOBIN: 13.9 g/dL (ref 12.0–15.0)
Potassium: 5.2 mmol/L — ABNORMAL HIGH (ref 3.5–5.1)
SODIUM: 141 mmol/L (ref 135–145)
TCO2: 21 mmol/L (ref 0–100)

## 2015-01-29 LAB — GLUCOSE, CAPILLARY
GLUCOSE-CAPILLARY: 108 mg/dL — AB (ref 65–99)
Glucose-Capillary: 80 mg/dL (ref 65–99)
Glucose-Capillary: 95 mg/dL (ref 65–99)
Glucose-Capillary: 141 mg/dL — ABNORMAL HIGH (ref 65–99)

## 2015-01-29 LAB — POCT ACTIVATED CLOTTING TIME
ACTIVATED CLOTTING TIME: 245 s
ACTIVATED CLOTTING TIME: 177 s
Activated Clotting Time: 282 seconds
Activated Clotting Time: 208 seconds
Activated Clotting Time: 196 seconds

## 2015-01-29 SURGERY — ABDOMINAL AORTOGRAM W/LOWER EXTREMITY
Anesthesia: LOCAL

## 2015-01-29 MED ORDER — LISINOPRIL 10 MG PO TABS
10.0000 mg | ORAL_TABLET | Freq: Every day | ORAL | Status: DC
  Administered 2015-01-30: 10 mg via ORAL
  Filled 2015-01-29: qty 1

## 2015-01-29 MED ORDER — SODIUM CHLORIDE 0.9 % IV SOLN
INTRAVENOUS | Status: DC
  Administered 2015-01-29: 12:00:00 via INTRAVENOUS

## 2015-01-29 MED ORDER — ACETAMINOPHEN 325 MG PO TABS: 325.0000 mg | ORAL_TABLET | ORAL | Status: DC | PRN

## 2015-01-29 MED ORDER — HEPARIN (PORCINE) IN NACL 2-0.9 UNIT/ML-% IJ SOLN
INTRAMUSCULAR | Status: AC
  Filled 2015-01-29: qty 1000

## 2015-01-29 MED ORDER — ALUM & MAG HYDROXIDE-SIMETH 200-200-20 MG/5ML PO SUSP: 15.0000 mL | ORAL | Status: DC | PRN

## 2015-01-29 MED ORDER — HYDROCODONE-ACETAMINOPHEN 10-325 MG PO TABS: 1.0000 | ORAL_TABLET | Freq: Four times a day (QID) | ORAL | Status: DC | PRN

## 2015-01-29 MED ORDER — MIDAZOLAM HCL 2 MG/2ML IJ SOLN
INTRAMUSCULAR | Status: DC | PRN
  Administered 2015-01-29: 1 mg via INTRAVENOUS

## 2015-01-29 MED ORDER — MIDAZOLAM HCL 2 MG/2ML IJ SOLN
INTRAMUSCULAR | Status: AC
  Filled 2015-01-29: qty 2

## 2015-01-29 MED ORDER — LABETALOL HCL 5 MG/ML IV SOLN
10.0000 mg | INTRAVENOUS | Status: DC | PRN
  Filled 2015-01-29: qty 4

## 2015-01-29 MED ORDER — HEPARIN SODIUM (PORCINE) 1000 UNIT/ML IJ SOLN
INTRAMUSCULAR | Status: AC
  Filled 2015-01-29: qty 1

## 2015-01-29 MED ORDER — INSULIN GLARGINE 100 UNIT/ML ~~LOC~~ SOLN
35.0000 [IU] | Freq: Every morning | SUBCUTANEOUS | Status: DC
  Administered 2015-01-30: 35 [IU] via SUBCUTANEOUS
  Filled 2015-01-29: qty 0.35

## 2015-01-29 MED ORDER — PANTOPRAZOLE SODIUM 40 MG PO TBEC
40.0000 mg | DELAYED_RELEASE_TABLET | Freq: Every day | ORAL | Status: DC
  Administered 2015-01-29 – 2015-01-30 (×2): 40 mg via ORAL
  Filled 2015-01-29 (×2): qty 1

## 2015-01-29 MED ORDER — OMEGA-3-ACID ETHYL ESTERS 1 G PO CAPS
1.0000 g | ORAL_CAPSULE | Freq: Every day | ORAL | Status: DC
  Administered 2015-01-30: 1 g via ORAL
  Filled 2015-01-29: qty 1

## 2015-01-29 MED ORDER — MOMETASONE FURO-FORMOTEROL FUM 100-5 MCG/ACT IN AERO
2.0000 | INHALATION_SPRAY | Freq: Two times a day (BID) | RESPIRATORY_TRACT | Status: DC
  Administered 2015-01-29 – 2015-01-30 (×2): 2 via RESPIRATORY_TRACT
  Filled 2015-01-29: qty 8.8

## 2015-01-29 MED ORDER — MIDAZOLAM HCL 2 MG/2ML IJ SOLN
INTRAMUSCULAR | Status: DC | PRN
  Administered 2015-01-29 (×3): 1 mg via INTRAVENOUS

## 2015-01-29 MED ORDER — AMLODIPINE BESYLATE 5 MG PO TABS
5.0000 mg | ORAL_TABLET | Freq: Every day | ORAL | Status: DC
  Administered 2015-01-30: 5 mg via ORAL
  Filled 2015-01-29: qty 1

## 2015-01-29 MED ORDER — ONDANSETRON HCL 4 MG/2ML IJ SOLN
INTRAMUSCULAR | Status: AC
  Filled 2015-01-29: qty 2

## 2015-01-29 MED ORDER — HEPARIN SODIUM (PORCINE) 1000 UNIT/ML IJ SOLN
INTRAMUSCULAR | Status: DC | PRN
  Administered 2015-01-29: 8000 [IU] via INTRAVENOUS

## 2015-01-29 MED ORDER — ONDANSETRON HCL 4 MG/2ML IJ SOLN: 4.0000 mg | Freq: Four times a day (QID) | INTRAMUSCULAR | Status: DC | PRN

## 2015-01-29 MED ORDER — OXYCODONE HCL 5 MG PO TABS
5.0000 mg | ORAL_TABLET | ORAL | Status: DC | PRN
  Administered 2015-01-30: 10 mg via ORAL
  Filled 2015-01-29: qty 2

## 2015-01-29 MED ORDER — SODIUM CHLORIDE 0.9 % IV SOLN
1.0000 mL/kg/h | INTRAVENOUS | Status: DC
  Administered 2015-01-29: 1 mL/kg/h via INTRAVENOUS

## 2015-01-29 MED ORDER — PHENOL 1.4 % MT LIQD
1.0000 | OROMUCOSAL | Status: DC | PRN
  Filled 2015-01-29: qty 177

## 2015-01-29 MED ORDER — FENTANYL CITRATE (PF) 100 MCG/2ML IJ SOLN
INTRAMUSCULAR | Status: AC
  Filled 2015-01-29: qty 2

## 2015-01-29 MED ORDER — FENTANYL CITRATE (PF) 100 MCG/2ML IJ SOLN
INTRAMUSCULAR | Status: DC | PRN
  Administered 2015-01-29: 50 ug via INTRAVENOUS

## 2015-01-29 MED ORDER — PRAVASTATIN SODIUM 40 MG PO TABS
40.0000 mg | ORAL_TABLET | Freq: Every day | ORAL | Status: DC
  Administered 2015-01-29: 40 mg via ORAL
  Filled 2015-01-29 (×2): qty 1

## 2015-01-29 MED ORDER — METOPROLOL TARTRATE 1 MG/ML IV SOLN: 2.0000 mg | INTRAVENOUS | Status: DC | PRN

## 2015-01-29 MED ORDER — ALPRAZOLAM 0.25 MG PO TABS: 1.0000 mg | ORAL_TABLET | Freq: Every evening | ORAL | Status: DC | PRN

## 2015-01-29 MED ORDER — LIDOCAINE HCL (PF) 1 % IJ SOLN
INTRAMUSCULAR | Status: AC
  Filled 2015-01-29: qty 30

## 2015-01-29 MED ORDER — HYDRALAZINE HCL 20 MG/ML IJ SOLN: 5.0000 mg | INTRAMUSCULAR | Status: DC | PRN

## 2015-01-29 MED ORDER — ACETAMINOPHEN 650 MG RE SUPP: 325.0000 mg | RECTAL | Status: DC | PRN

## 2015-01-29 MED ORDER — MORPHINE SULFATE 2 MG/ML IJ SOLN: 2.0000 mg | INTRAMUSCULAR | Status: DC | PRN

## 2015-01-29 MED ORDER — ONDANSETRON HCL 4 MG/2ML IJ SOLN
4.0000 mg | Freq: Four times a day (QID) | INTRAMUSCULAR | Status: DC | PRN
  Administered 2015-01-29: 4 mg via INTRAVENOUS
  Filled 2015-01-29: qty 2

## 2015-01-29 MED ORDER — ACETAMINOPHEN 325 MG PO TABS: 650.0000 mg | ORAL_TABLET | ORAL | Status: DC | PRN

## 2015-01-29 MED ORDER — GUAIFENESIN-DM 100-10 MG/5ML PO SYRP: 15.0000 mL | ORAL_SOLUTION | ORAL | Status: DC | PRN

## 2015-01-29 MED ORDER — FENTANYL CITRATE (PF) 100 MCG/2ML IJ SOLN
INTRAMUSCULAR | Status: DC | PRN
  Administered 2015-01-29 (×3): 25 ug via INTRAVENOUS

## 2015-01-29 MED ORDER — DOCUSATE SODIUM 100 MG PO CAPS
100.0000 mg | ORAL_CAPSULE | Freq: Every day | ORAL | Status: DC
  Filled 2015-01-29: qty 1

## 2015-01-29 MED ORDER — ALBUTEROL SULFATE (2.5 MG/3ML) 0.083% IN NEBU
3.0000 mL | INHALATION_SOLUTION | RESPIRATORY_TRACT | Status: DC | PRN
Start: 2015-01-29 — End: 2015-01-30

## 2015-01-29 MED ORDER — SODIUM CHLORIDE 0.9 % IV SOLN: 1.0000 mL/kg/h | INTRAVENOUS | Status: DC

## 2015-01-29 SURGICAL SUPPLY — 28 items
BALLN ARMADA 4.0X120X150 (BALLOONS) ×2
BALLN LUTONIX 4X150X130 (BALLOONS) ×2
BALLOON ARMADA 4.0X120X150 (BALLOONS) IMPLANT
BALLOON LUTONIX 4X150X130 (BALLOONS) IMPLANT
CATH CROSS OVER TEMPO 5F (CATHETERS) ×1 IMPLANT
CATH CXI SUPP ANG 2.6FR 150CM (MICROCATHETER) ×1 IMPLANT
CATH OMNI FLUSH 5F 65CM (CATHETERS) ×1 IMPLANT
CATH QUICKCROSS SUPP .018X90CM (MICROCATHETER) ×1 IMPLANT
COVER PRB 48X5XTLSCP FOLD TPE (BAG) IMPLANT
COVER PROBE 5X48 (BAG) ×2
DIAMONDBACK CLASSIC OAS 1.5MM (CATHETERS) ×2
DRAPE ZERO GRAVITY STERILE (DRAPES) ×1 IMPLANT
GUIDEWIRE ANGLED .035X150CM (WIRE) ×1 IMPLANT
KIT ENCORE 26 ADVANTAGE (KITS) ×1 IMPLANT
KIT MICROINTRODUCER 5F 7206 (SHEATH) ×1 IMPLANT
KIT PV (KITS) ×2 IMPLANT
SHEATH PINNACLE 5F 10CM (SHEATH) ×1 IMPLANT
SHEATH PINNACLE ST 7F 45CM (SHEATH) ×1 IMPLANT
SHIELD RADPAD SCOOP 12X17 (MISCELLANEOUS) ×1 IMPLANT
SYR MEDRAD MARK V 150ML (SYRINGE) ×1 IMPLANT
SYSTEM DIMNDBCK CLSC OAS 1.5MM (CATHETERS) IMPLANT
TAPE RADIOPAQUE TURBO (MISCELLANEOUS) ×1 IMPLANT
TRANSDUCER W/STOPCOCK (MISCELLANEOUS) ×2 IMPLANT
TRAY PV CATH (CUSTOM PROCEDURE TRAY) ×2 IMPLANT
WIRE BENTSON .035X145CM (WIRE) ×1 IMPLANT
WIRE ROSEN-J .035X180CM (WIRE) ×1 IMPLANT
WIRE SPARTACORE .014X300CM (WIRE) ×1 IMPLANT
WIRE VIPER WIRECTO 0.014 (WIRE) ×1 IMPLANT

## 2015-01-29 NOTE — Interval H&P Note (Signed)
History and Physical Interval Note:  [id number]  Stephanie Olson  has presented today for surgery, with the diagnosis of PVD  The various methods of treatment have been discussed with the patient and family. After consideration of risks, benefits and other options for treatment, the patient has consented to  Procedure(s): Abdominal Aortogram w/Lower Extremity (N/A) as a surgical intervention .  The patient's history has been reviewed, patient examined, no change in status, stable for surgery.  I have reviewed the patient's chart and labs.  Questions were answered to the patient's satisfaction.     Annamarie Major

## 2015-01-29 NOTE — Op Note (Signed)
Patient name: Stephanie Olson MRN: [id number] DOB: 1941-05-03 Sex: female  01/29/2015 Pre-operative Diagnosis: right lower extremity ulcer Post-operative diagnosis:  Same Surgeon:  Durene Cal Procedure Performed:  1.  Ultrasound-guided access, left femoral artery  2.  Abdominal aortogram  3.  Right lower extremity runoff  4.  Atherectomy with drug coated balloon angioplasty, right superficial femoral artery    Indications:  The patient has a nonhealing wound on her right ulcer.  Ultrasound identified decreased blood flow.  She is here today for further evaluation and possible intervention.  Findings: 10 cm segment of diffuse stenosis.  Several tandem 90% lesions were identified.  Treatment was with CSI atherectomy 1.5 classic device, followed by 4 mm drug coated balloon angioplasty.  Residual stenosis was 5%  Procedure:  The patient was identified in the holding area and taken to room 8.  The patient was then placed supine on the table and prepped and draped in the usual sterile fashion.  A time out was called.  Ultrasound was used to evaluate the left common femoral artery.  It was patent .  A digital ultrasound image was acquired.  A micropuncture needle was used to access the left common femoral artery under ultrasound guidance.  An 018 wire was advanced without resistance and a micropuncture sheath was placed.  The 018 wire was removed and a benson wire was placed.  The micropuncture sheath was exchanged for a 5 french sheath.  An omniflush catheter was advanced over the wire to the level of L-1.  An abdominal angiogram was obtained.  Next, using the omniflush catheter and a benson wire, the aortic bifurcation was crossed and the catheter was placed into theright external iliac artery and right runoff was obtained.    Findings:   Aortogram:  No evidence of renal artery stenosis.  The infrarenal abdominal aorta is widely patent.  Bilateral common and external iliac arteries are widely  patent.  Right Lower Extremity:  The right common femoral and profunda femoral artery are patent throughout it's course.  The superficial femoral artery is patent proximally however in the region of the adductor canal there is a section approximately 10-12 centimeters long with multiple tandem lesions of 90%.  The artery is also very small, measuring 4 mm in diameter.  The artery returns normal caliber approximately the above-knee popliteal artery.  The below-knee popliteal artery is patent.  There is two-vessel runoff via the anterior tibial and peroneal artery.  The posterior tibial artery is occluded.  Left Lower Extremity:  Not evaluated  Intervention:  After the above images were acquired, the decision was made to proceed with intervention.  Over a Rosen wire, a 7 French 45 cm bright tip sheath was advanced into the right common femoral artery.  The patient was fully heparinized.  Using a 035 Glidewire and a quick cross catheter, the lesion was successfully crossed, confirmed by contrast injection through the quick cross catheter in the popliteal artery.  I then placed a Viper wire.  The CSI 1.5 classic device was prepared.  I then made a single pass at low medium and high speeds across the lesion in the adductor canal.  Treatment length was 12 cm.  I made a second pass at high speed.  The device was then removed.  I performed angioplasty using a 4 x 1 20  Abbott 014 balloon.  This was taken up to 2 minutes.  Follow-up imaging revealed a nonflow limiting dissection with excellent result, stenosis was  5%.  I then inserted the Lutonix 4x150 drug coated balloon.  There was at least 1 cm overlap of the atherectomized segment, proximal and distal.  The balloon was taken to nominal pressure or profile which was approximately 8 atm.  It was held up for 2 minutes and 30 seconds.  Completion imaging showed widely patent right superficial femoral artery with stenosis of no more than 5%.  I then imaged the runoff  vessels.  These were unchanged to pre-intervention.  At this point the catheters and wires were withdrawn to the left external iliac artery.  The patient be taken the holding area for a sheath pull once her coagulation profile corrects.  There were no immediate complications.  Impression:  #1  No aortoiliac stenosis identified.  #2  Several tandem lesions within a 10-12 centimeters section of the right adductor canal.  This was treated with atherectomy using a CSI classic 1.5 device, followed by drug coated balloon angioplasty using a 4 x 1 50 Lutonix device.  #3  Two-vessel runoff via the anterior tibial and peroneal artery   V. Annamarie Major, M.D. [facility] Office: [phone] Pager:  [phone]

## 2015-01-29 NOTE — Progress Notes (Signed)
Site area: Left FA Site Prior to Removal:  Level ) Pressure Applied For:20 min Manual:yes    Patient Status During Pull:  stable Post Pull Site:  Level 0 Post Pull Instructions Given:  done Post Pull Pulses Present: palpable Dressing Applied: -clear Bedrest begins @ 1850--2250 Comments:

## 2015-01-29 NOTE — Research (Signed)
SAFE Registry Informed Consent   Subject Name: Stephanie Olson  Subject met inclusion and exclusion criteria.  The informed consent form, study requirements and expectations were reviewed with the subject and questions and concerns were addressed prior to the signing of the consent form.  The subject verbalized understanding of the trail requirements.  The subject agreed to participate in the SAFE trial and signed the informed consent.  The informed consent was obtained prior to performance of any protocol-specific procedures for the subject.  A copy of the signed informed consent was given to the subject and a copy was placed in the subject's medical record.    01/29/2015, 12:07  

## 2015-01-29 NOTE — H&P (View-Only) (Signed)
Patient name: Stephanie Olson MRN: [id number] DOB: 09-01-1941 Sex: female   Referred by: ER  Reason for referral:  Chief Complaint  Patient presents with  . New Evaluation    HISTORY OF PRESENT ILLNESS: This is a very pleasant 74 year old female who is referred today for evaluation of a diabetic foot ulcer on the right.  The patient has a history of a skin graft to the right foot in the 1990s.  She has developed a ulcer around her skin graft which has been present for approximately 1-2 months.  She is recently seen in the emergency department and started on antibiotics.  She has been getting follow-up with the wound center.  Home health comes out and performs dressing changes 3 times a week.  She states that this area is occasionally painful.  She denies fevers or chills.  The patient is a diabetic.  She does not know her last hemoglobin A1c.  She has not been monitoring her blood sugars.  She suffers from hypercholesterolemia which is managed with a statin.  Her hypertension is treated with multiple medications including an ACE inhibitor.  She is a nonsmoker.  She recently had ultrasound studies of her legs which revealed segmental disease.  Past Medical History  Diagnosis Date  . Diabetes mellitus without complication     Past Surgical History  Procedure Laterality Date  . Foot surgery      History   Social History  . Marital Status: Divorced    Spouse Name: N/A  . Number of Children: N/A  . Years of Education: N/A   Occupational History  . Not on file.   Social History Main Topics  . Smoking status: Current Every Day Smoker    Types: Cigarettes  . Smokeless tobacco: Not on file  . Alcohol Use: No  . Drug Use: No  . Sexual Activity: Not on file   Other Topics Concern  . Not on file   Social History Narrative    No family history on file.  Allergies as of 01/14/2015  . (No Known Allergies)    Current Outpatient Prescriptions on File Prior to Visit    Medication Sig Dispense Refill  . ADVAIR DISKUS 250-50 MCG/DOSE AEPB Inhale 1 puff into the lungs daily as needed.  0  . ALPRAZolam (XANAX) 1 MG tablet Take 1 mg by mouth at bedtime as needed.    Marland Kitchen amLODipine (NORVASC) 5 MG tablet Take 5 mg by mouth daily.  0  . LANTUS 100 UNIT/ML injection Take 35 Units by mouth every morning.  0  . lisinopril (PRINIVIL,ZESTRIL) 10 MG tablet Take 10 mg by mouth daily.    . Omega-3 Fatty Acids (FISH OIL PO) Take 1 capsule by mouth daily.    Marland Kitchen omeprazole (PRILOSEC) 20 MG capsule Take 20 mg by mouth daily.    . pravastatin (PRAVACHOL) 20 MG tablet Take 20 mg by mouth at bedtime.  0  . PROAIR HFA 108 (90 BASE) MCG/ACT inhaler Inhale 2 puffs into the lungs every 4 (four) hours as needed.  0  . clindamycin (CLEOCIN) 150 MG capsule Take 2 capsules (300 mg total) by mouth every 6 (six) hours. (Patient not taking: Reported on 01/14/2015) 56 capsule 0  . VITAMIN D, ERGOCALCIFEROL, PO Take 1 capsule by mouth daily.     No current facility-administered medications on file prior to visit.     REVIEW OF SYSTEMS: Please see history of present illness for pertinent positives and negatives.  All other systems are negative.  PHYSICAL EXAMINATION:  Filed Vitals:   01/14/15 0849  BP: 147/86  Pulse: 92  Height: 5\' 9"  (1.753 m)  Weight: 185 lb (83.915 kg)  SpO2: 100%   Body mass index is 27.31 kg/(m^2). General: The patient appears their stated age.   HEENT:  No gross abnormalities Pulmonary: Respirations are non-labored Abdomen: Soft and non-tender  Musculoskeletal: There are no major deformities.   Neurologic: No focal weakness or paresthesias are detected, Skin: Large defect with no open areas on the lateral malleolus consistent with prior skin graft.  On the lateral heel there is a 2 x 2 open ulcer with granulation tissue.  No significant drainage or erythema. Psychiatric: The patient has normal affect. Cardiovascular: There is a regular rate and rhythm without  significant murmur appreciated.  Pedal pulses are not palpable.  No carotid bruit  Diagnostic Studies: I have reviewed her outside ultrasound studies.  Ankle-brachial indices were 0.89 bilaterally with segmental disease.   Assessment:  Right diabetic foot ulcer Plan: I discussed our treatment options with the patient.  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  Her wound has not healed over a two-month period.  Therefore, I think the next step is to proceed with angiography to better define her anatomy and to proceed with intervention if there is a lesion amenable to treatment.  I discussed the risks and benefits of the procedure including the risk of bleeding from access.  We also discussed the risk of distal embolization.  She understands that if we cannot get this wound to heal that she is at risk for amputation.  I have scheduled her procedure for Tuesday, May 17.  I wanted to start her on a baby aspirin today, however she states that this does not agree with her.     Eldridge Abrahams, M.D. [facility] [place] Office: [phone] Pager:  [phone]

## 2015-01-29 NOTE — Significant Event (Signed)
Received report from [facility] in Cath lab.  Patient to room at 1915.  Dry dressing in place to left groin no bleeding noted, small area harder to touch at base of dressing.  No bruising noted.  VS on admission to unit:  BP 136/79, HR 97, Temp 98.5 oral, O2 sat 99% on RA.  Patient denies pain, only states that she is "cold"  Bedrest started at 1850, patient instructed to lie flat & keep leg straight for 4 hours.  Bed left in lowest position, pt instructed on how to use call bell and it was left in her left hand.

## 2015-01-30 ENCOUNTER — Encounter (HOSPITAL_COMMUNITY): Payer: Self-pay | Admitting: Surgery

## 2015-01-30 DIAGNOSIS — E11621 Type 2 diabetes mellitus with foot ulcer: Secondary | ICD-10-CM | POA: Diagnosis not present

## 2015-01-30 LAB — GLUCOSE, CAPILLARY
Glucose-Capillary: 180 mg/dL — ABNORMAL HIGH (ref 65–99)
Glucose-Capillary: 190 mg/dL — ABNORMAL HIGH (ref 65–99)
Glucose-Capillary: 165 mg/dL — ABNORMAL HIGH (ref 65–99)

## 2015-01-30 LAB — CLOSTRIDIUM DIFFICILE BY PCR: CDIFFPCR: NEGATIVE

## 2015-01-30 MED FILL — Heparin Sodium (Porcine) 2 Unit/ML in Sodium Chloride 0.9%: INTRAMUSCULAR | Qty: 1000 | Status: AC

## 2015-01-30 MED FILL — Lidocaine HCl Local Preservative Free (PF) Inj 1%: INTRAMUSCULAR | Qty: 30 | Status: AC

## 2015-01-30 NOTE — Progress Notes (Signed)
  Progress Note    01/30/2015 8:37 AM 1 Day Post-Op  Subjective:  No complaints  Tm 99.2 HR 80's-100's NSR [id number] systolic [id number] RA  Filed Vitals:   01/30/15 0633  BP: 157/83  Pulse: 93  Temp: 99.2 F (37.3 C)  Resp: 18    Physical Exam: Cardiac:  regular Lungs:  Non labored Incisions:  Left groin is soft without hematoma Extremities:  Right foot with audible doppler signals right PT/DP; monophasic peroneal doppler signal  CBC    Component Value Date/Time   WBC 8.6 12/23/2014 1055   RBC 4.52 12/23/2014 1055   HGB 13.9 01/29/2015 1152   HCT 41.0 01/29/2015 1152   PLT 246 12/23/2014 1055   MCV 90.3 12/23/2014 1055   MCH 28.3 12/23/2014 1055   MCHC 31.4 12/23/2014 1055   RDW 16.3* 12/23/2014 1055   LYMPHSABS 3.8 12/23/2014 1055   MONOABS 0.7 12/23/2014 1055   EOSABS 0.2 12/23/2014 1055   BASOSABS 0.1 12/23/2014 1055    BMET    Component Value Date/Time   NA 141 01/29/2015 1152   K 5.2* 01/29/2015 1152   CL 108 01/29/2015 1152   CO2 19 12/23/2014 1055   GLUCOSE 99 01/29/2015 1152   BUN 22* 01/29/2015 1152   CREATININE 1.60* 01/29/2015 1152   CALCIUM 8.8 12/23/2014 1055   GFRNONAA 29* 12/23/2014 1055   GFRAA 33* 12/23/2014 1055    INR No results found for: INR   Intake/Output Summary (Last 24 hours) at 01/30/15 0837 Last data filed at 01/29/15 1800  Gross per 24 hour  Intake    240 ml  Output      0 ml  Net    240 ml     Assessment:  73 y.o. female is s/p:  1. Ultrasound-guided access, left femoral artery 2. Abdominal aortogram 3. Right lower extremity runoff 4. Atherectomy with drug coated balloon angioplasty, right superficial femoral artery  1 Day Post-Op  Plan: -pt with audible doppler signals right foot (PT > DP) and palpable left DP -pt with diarrhea with 3 BM's last night-pt placed on precautions and C diff is being sent. -await results of C diff before possible  dc    Leontine Locket, PA-C [facility] [phone] 01/30/2015 8:37 AM    Agree Plan for d/c today Has f/u in 1 month with duplex and ABI (already scheduled)  Annamarie Major

## 2015-01-30 NOTE — Progress Notes (Addendum)
Patient has had 3 bms since onset of shift at 2300. Stools noted to be clear mucoid, with slight brown color. Patient placed on enteric precautions and stool sample sent to test for c. Diff. Will continue to monitor.

## 2015-01-30 NOTE — Progress Notes (Signed)
Pt discharge home with family to transport her home. Pt IV and telemetry removed prior to pt discharge. Pt granddaughter wheeled pt off unit per unit secretary before RN had the chance to go over discharge instructions with pt and family. As RN was busy in a new admit room during time. Granddaughter called x3, unable to reach; pt son [name] called and notified of mother leaving before discharge instructions he said OK. Pt transported off unit via wheelchair with belongings to the side by granddaughter. Francis Gaines Saprina Chuong RN.

## 2015-01-31 NOTE — Discharge Summary (Signed)
[facility] Discharge Summary  Stephanie Olson [id number] 74 y.o. female  [id number]  Admission Date: 01/29/2015  Discharge Date: 01/30/2015  Physician: Stephanie Olson  Admission Diagnosis: PVD, right lower extremity ulcer  HPI:   This is a 74 y.o. female who was referred for evaluation of a diabetic foot ulcer on the right. The patient has a history of a skin graft to the right foot in the 1990s. She has developed a ulcer around her skin graft which has been present for approximately 1-2 months. She is recently seen in the emergency department and started on antibiotics. She has been getting follow-up with the wound center. Home health comes out and performs dressing changes 3 times a week. She states that this area is occasionally painful. She denies fevers or chills.  The patient is a diabetic. She does not know her last hemoglobin A1c. She has not been monitoring her blood sugars.  She suffers from hypercholesterolemia which is managed with a statin. Her hypertension is treated with multiple medications including an ACE inhibitor. She is a nonsmoker. She recently had ultrasound studies of her legs which revealed segmental disease.  Hospital Course:  The patient was admitted to the hospital and taken to the [facility] lab on 01/29/2015 and underwent: 1. Ultrasound-guided access, left femoral artery 2. Abdominal aortogram 3. Right lower extremity runoff 4. Atherectomy with drug coated balloon angioplasty, right superficial femoral artery  The patient tolerated the procedure well and was transported to the recovery room in stable condition.   The patient developed diarrhea and had three bowel movements that evening and was placed on enteric precaution. She was admitted for observation and C. Diff was ordered. Her results came back negative. On POD 1, she had audible doppler signals to her right foot and palpable left dorsalis pedis. Her right groin sheath  site was without hematoma. She was discharged home on POD 1 in good condition. She will follow up in one month with duplex and ABIs.   CBC    Component Value Date/Time   WBC 8.6 12/23/2014 1055   RBC 4.52 12/23/2014 1055   HGB 13.9 01/29/2015 1152   HCT 41.0 01/29/2015 1152   PLT 246 12/23/2014 1055   MCV 90.3 12/23/2014 1055   MCH 28.3 12/23/2014 1055   MCHC 31.4 12/23/2014 1055   RDW 16.3* 12/23/2014 1055   LYMPHSABS 3.8 12/23/2014 1055   MONOABS 0.7 12/23/2014 1055   EOSABS 0.2 12/23/2014 1055   BASOSABS 0.1 12/23/2014 1055    BMET    Component Value Date/Time   NA 141 01/29/2015 1152   K 5.2* 01/29/2015 1152   CL 108 01/29/2015 1152   CO2 19 12/23/2014 1055   GLUCOSE 99 01/29/2015 1152   BUN 22* 01/29/2015 1152   CREATININE 1.60* 01/29/2015 1152   CALCIUM 8.8 12/23/2014 1055   GFRNONAA 29* 12/23/2014 1055   GFRAA 33* 12/23/2014 1055     Discharge Instructions:   The patient is discharged to home with extensive instructions on wound care and progressive ambulation.  They are instructed not to drive or perform any heavy lifting until returning to see the physician in his office.  Discharge Instructions    Call MD for:  redness, tenderness, or signs of infection (pain, swelling, bleeding, redness, odor or green/yellow discharge around incision site)    Complete by:  As directed      Call MD for:  severe or increased pain, loss or decreased feeling  in affected limb(s)  Complete by:  As directed      Call MD for:  temperature >100.5    Complete by:  As directed      Driving Restrictions    Complete by:  As directed   No driving for 24 hours and while taking pain medication.     Lifting restrictions    Complete by:  As directed   No lifting for 2 weeks     Resume previous diet    Complete by:  As directed      may wash over wound with mild soap and water    Complete by:  As directed            Discharge Diagnosis:  PVD  Secondary Diagnosis: Patient  Active Problem List   Diagnosis Date Noted  . PVD (peripheral vascular disease) 01/29/2015   Past Medical History  Diagnosis Date  . Diabetes mellitus without complication   . Peripheral vascular disease   . Hypertension   . Diabetic foot ulcers     RIGHT   . Arthritis        Medication List    STOP taking these medications        clindamycin 150 MG capsule  Commonly known as:  CLEOCIN      TAKE these medications        ADVAIR DISKUS 250-50 MCG/DOSE Aepb  Generic drug:  Fluticasone-Salmeterol  Inhale 1 puff into the lungs daily as needed.     ALPRAZolam 1 MG tablet  Commonly known as:  XANAX  Take 1 mg by mouth at bedtime as needed.     amLODipine 5 MG tablet  Commonly known as:  NORVASC  Take 5 mg by mouth daily.     FISH OIL PO  Take 1 capsule by mouth daily.     HYDROcodone-acetaminophen 10-325 MG per tablet  Commonly known as:  NORCO  Take 1 tablet by mouth every 6 (six) hours as needed.     LANTUS 100 UNIT/ML injection  Generic drug:  insulin glargine  Take 35 Units by mouth every morning.     lisinopril 10 MG tablet  Commonly known as:  PRINIVIL,ZESTRIL  Take 10 mg by mouth daily.     omeprazole 20 MG capsule  Commonly known as:  PRILOSEC  Take 20 mg by mouth daily.     pravastatin 40 MG tablet  Commonly known as:  PRAVACHOL  Take 40 mg by mouth at bedtime.     PROAIR HFA 108 (90 BASE) MCG/ACT inhaler  Generic drug:  albuterol  Inhale 2 puffs into the lungs every 4 (four) hours as needed.     Vitamin B-12 5000 MCG Subl  Place under the tongue.        Disposition: Home  Patient's condition: is Good  Follow up: 1. Dr. Trula Slade in 2 weeks   Stephanie Jock, PA-C [facility] [phone] 01/31/2015  4:32 PM

## 2015-02-01 ENCOUNTER — Telehealth: Payer: Self-pay | Admitting: Surgery

## 2015-02-01 NOTE — Telephone Encounter (Signed)
LM for patient re appts, dpm

## 2015-02-01 NOTE — Telephone Encounter (Signed)
-----   Message from Mena Goes, RN sent at 01/29/2015  4:17 PM EDT ----- Regarding: Schedule   ----- Message -----    From: Serafina Mitchell, MD    Sent: 01/29/2015   3:35 PM      To: [facility] Charge Pool  01/29/2015:  Surgeon:  Annamarie Major Procedure Performed:  1.  Ultrasound-guided access, left femoral artery  2.  Abdominal aortogram  3.  Right lower extremity runoff  4.  Atherectomy with drug coated balloon angioplasty, right superficial femoral artery   Follow-up one month with duplex of the right lower extremity and ankle brachial indices

## 2015-02-05 NOTE — Telephone Encounter (Signed)
See phone message of 01/25/15.  Janathan Bribiesca Eldridge-Lewis, RMA-AMT

## 2015-02-11 ENCOUNTER — Telehealth: Payer: Self-pay

## 2015-02-11 DIAGNOSIS — Z9889 Other specified postprocedural states: Secondary | ICD-10-CM

## 2015-02-11 DIAGNOSIS — L97919 Non-pressure chronic ulcer of unspecified part of right lower leg with unspecified severity: Secondary | ICD-10-CM

## 2015-02-11 NOTE — Telephone Encounter (Signed)
rec'd call from Adv. [place] RN.  Reported that the right foot diabetic ulcer.  Reported it measures 2.5/2.5 cm, and is not improving.  Stated she is using Calcium Alginate, nonadherent dressing and cover dressing over the diabetic ulcer.  Also, voiced concern of right lateral foot having black eschar along the side of the foot.  Stated the pt. has not been followed by her PCP, and hasn't followed-up with Dr. Trula Slade for continued wound management.  Is requesting a Wound Care Ctr. Referral.  Discussed with Dr. Trula Slade.  Reported that the pt. needs to have a right LE Art. Duplex and ABI's, and see the Nurse Practitioner this week.  Will have scheduling call pt. for appt.

## 2015-02-11 NOTE — Telephone Encounter (Signed)
Spoke with patients son, [name] to schedule, dpm

## 2015-02-13 ENCOUNTER — Encounter: Payer: Self-pay | Admitting: Family

## 2015-02-14 ENCOUNTER — Telehealth: Payer: Self-pay | Admitting: Surgery

## 2015-02-14 ENCOUNTER — Ambulatory Visit (INDEPENDENT_AMBULATORY_CARE_PROVIDER_SITE_OTHER): Payer: Self-pay | Admitting: Family

## 2015-02-14 ENCOUNTER — Encounter (HOSPITAL_COMMUNITY): Payer: Medicare HMO

## 2015-02-14 ENCOUNTER — Ambulatory Visit (HOSPITAL_COMMUNITY)
Admission: RE | Admit: 2015-02-14 | Discharge: 2015-02-14 | Disposition: A | Discharge status: Home / Self Care | Payer: Medicare HMO | Source: Ambulatory Visit | Attending: Family | Admitting: Family

## 2015-02-14 ENCOUNTER — Encounter: Payer: Self-pay | Admitting: Family

## 2015-02-14 ENCOUNTER — Ambulatory Visit: Payer: Medicare HMO | Admitting: Family

## 2015-02-14 ENCOUNTER — Ambulatory Visit (INDEPENDENT_AMBULATORY_CARE_PROVIDER_SITE_OTHER)
Admission: RE | Admit: 2015-02-14 | Discharge: 2015-02-14 | Disposition: A | Discharge status: Home / Self Care | Payer: Medicare HMO | Source: Ambulatory Visit | Attending: Family | Admitting: Family

## 2015-02-14 VITALS — BP 106/64 | HR 84 | Resp 16 | Ht 69.0 in | Wt 180.0 lb

## 2015-02-14 DIAGNOSIS — M79672 Pain in left foot: Secondary | ICD-10-CM | POA: Insufficient documentation

## 2015-02-14 DIAGNOSIS — Z9889 Other specified postprocedural states: Secondary | ICD-10-CM

## 2015-02-14 DIAGNOSIS — L97919 Non-pressure chronic ulcer of unspecified part of right lower leg with unspecified severity: Secondary | ICD-10-CM

## 2015-02-14 DIAGNOSIS — I739 Peripheral vascular disease, unspecified: Secondary | ICD-10-CM

## 2015-02-14 DIAGNOSIS — E1151 Type 2 diabetes mellitus with diabetic peripheral angiopathy without gangrene: Secondary | ICD-10-CM

## 2015-02-14 DIAGNOSIS — E1159 Type 2 diabetes mellitus with other circulatory complications: Secondary | ICD-10-CM

## 2015-02-14 NOTE — Telephone Encounter (Signed)
Spoke with pt. Stephanie Olson [facility] appt 03/18/15 at 2:15 pm. [facility] to continue wound care until 03/17/15.  Follow up with [facility] in September. Pt verbalized understanding.

## 2015-02-14 NOTE — Patient Instructions (Addendum)

## 2015-02-14 NOTE — Progress Notes (Signed)
Postoperative Visit   History of Present Illness  Stephanie Olson is a 74 y.o. female patient of Dr. Trula Slade who was initially referred in May 2016 for evaluation of a diabetic foot ulcer on the right. The patient has a history of a skin graft to the right foot in the 1990s. She has developed a ulcer around her skin graft which has been present for approximately 1-2 months at the time of her May 2016 visit with Dr. Trula Slade. She  recently seen in the emergency department and started on antibiotics. She has been getting follow-up with the wound center. Home health comes out and performs dressing changes 3 times a week. She states that this area is occasionally painful. She denies fevers or chills. Pt home phone: [phone] Stephanie Olson, grand daughter - [phone] - accompanies pt to all of her medical appointments.  The patient is a diabetic. She does not know her last hemoglobin A1c. She has not been monitoring her blood sugars.  She suffers from hypercholesterolemia which is managed with a statin. Her hypertension is treated with multiple medications including an ACE inhibitor. She is a nonsmoker. She recently had ultrasound studies of her legs which revealed segmental disease.  She returns today with c/o right foot pain that is no worse than before the atherectomy. She is s/p right SFA atherectomy with angioplasty on 01/29/15.  [place] daughter with pt states that the right foot ulcer is improving since the atherectomy, her grandmother has better sensation in her right foot; also states that [place] advised appointment here for evaluation of right foot ulcer. [place] daughter states that [place] is changing her right foot dressing twice/week. Pt states she ran out of pain medication several days ago, is asking for more; grand daughter states that pt has been taking prescription medication for the pain in her right foot for over a year. She is taking Aleve  instead of ASA as she states that ASA makes her itch. She was taking Plavix before the atherectomy and has not resumed, is not taking any other blood thinners. Pt denies any history of bleeding problems.  [place] daughter states pt is taking an antibiotic for 30 days.   Past Medical History, Past Surgical History, Social History, Family History, Medications, Allergies, and Review of Systems are unchanged from previous evaluation on 01/14/15.  Pt is seated in a wheelchair.   Physical Examination  Filed Vitals:   02/14/15 1109  BP: 106/64  Pulse: 84  Resp: 16  Height: 5\' 9"  (1.753 m)  Weight: 180 lb (81.647 kg)  SpO2: 100%   Body mass index is 26.57 kg/(m^2).  PHYSICAL EXAMINATION: General: The patient appears their stated age.   HEENT:  No gross abnormalities Pulmonary: Respirations are non-labored, occasional dry cough Abdomen: Soft and non-tender. Musculoskeletal: There are no major deformities.   Neurologic: No focal weakness or paresthesias are detected, Skin: Large ulcer lateral aspect right foot, foul smelling, no drainage. No erythema. Psychiatric: The patient is agitated, does not seem to know much about her medical history, grand daughter answers questions. Cardiovascular: There is a regular rate and rhythm.  Vascular: Vessel Right Left  Radial Palpable Palpable  Brachial Palpable Palpable  Aorta Not palpable N/A  Femoral Palpable Not Palpable  Popliteal Not palpable Not palpable  PT Not  Palpable not Palpable  DP 2+ Palpable faintly Palpable    Medical Decision Making  Stephanie Olson is a 74 y.o. female who  Has a history  of a skin graft to the right foot in the 1990s. She has developed a ulcer around her skin graft which has been present for approximately 1-2 months at the time of her May 2016 visit with Dr. Trula Slade. She is s/p right SFA atherectomy with angioplasty on 01/29/15.  [place] daughter with pt states that the right foot ulcer is improving since the  atherectomy, her grandmother has better sensation in her right foot; also states that [place] advised appointment here for evaluation of right foot ulcer. [place] daughter states that [place] is changing her right foot dressing twice/week. Discussed pt HPI, physical exam results with Dr. Oneida Alar.  Spoke with pharmacist at [facility] [address], [place], the pharmacy on file for pt.; Ronalee Belts said that prescription for clopidogrel 75 mg was sent on 01/29/15 by Dr. Trula Slade, then transferred to a [place] on 02/07/15 but has not yet been picked up. [place] daughter informed that pt needs to start taking this medication ASAP and take daily as prescribed to help prevent blockages from getting worse in her arteries.   Pt's grand daughter states that patient's primary care provider had been prescribing patient's analgesics and states that patient has an appointment with her PCP next week.  Continue [place] dressing changes to right foot ulcer until pt can be seen and managed by [facility] wound care center which appears to be mid July 2016. The patient is currently on a statin: pravastatin.   The patient is not currently not on an anti-platelet: she "itches" with ASA use. The patient will be started on Plavix 75 mg PO daily.  Thank you for allowing [place] to participate in this patient's care.  NICKEL, Sharmon Leyden, RN, MSN, FNP-C [facility] [place] Office: [phone]  02/14/2015, 11:40 AM  Clinic MD: Oneida Alar

## 2015-02-15 NOTE — Addendum Note (Signed)
Addended by: Dorthula Rue L on: 02/15/2015 04:09 PM   Modules accepted: Orders

## 2015-02-20 ENCOUNTER — Encounter (HOSPITAL_COMMUNITY): Payer: Medicare HMO

## 2015-02-25 ENCOUNTER — Encounter: Payer: Medicare HMO | Admitting: Surgery

## 2015-02-27 ENCOUNTER — Telehealth: Payer: Self-pay | Admitting: *Deleted

## 2015-02-27 NOTE — Telephone Encounter (Signed)
Called Stephanie Olson for her 30 day follow-up for the [facility]. She states she has been doing well since her intervention in May. She has had no additional procedures or admissions.

## 2015-03-02 ENCOUNTER — Emergency Department (HOSPITAL_COMMUNITY)
Admission: EM | Admit: 2015-03-02 | Discharge: 2015-03-02 | Disposition: A | Discharge status: Home / Self Care | Payer: Medicare PPO | Attending: Emergency Medicine | Admitting: Emergency Medicine

## 2015-03-02 ENCOUNTER — Encounter (HOSPITAL_COMMUNITY): Payer: Self-pay | Admitting: Emergency Medicine

## 2015-03-02 DIAGNOSIS — D649 Anemia, unspecified: Secondary | ICD-10-CM

## 2015-03-02 DIAGNOSIS — E872 Acidosis, unspecified: Secondary | ICD-10-CM

## 2015-03-02 DIAGNOSIS — Z4800 Encounter for change or removal of nonsurgical wound dressing: Secondary | ICD-10-CM | POA: Diagnosis not present

## 2015-03-02 DIAGNOSIS — E11621 Type 2 diabetes mellitus with foot ulcer: Secondary | ICD-10-CM | POA: Insufficient documentation

## 2015-03-02 DIAGNOSIS — L97512 Non-pressure chronic ulcer of other part of right foot with fat layer exposed: Secondary | ICD-10-CM | POA: Diagnosis not present

## 2015-03-02 DIAGNOSIS — M1990 Unspecified osteoarthritis, unspecified site: Secondary | ICD-10-CM | POA: Diagnosis not present

## 2015-03-02 DIAGNOSIS — R7989 Other specified abnormal findings of blood chemistry: Secondary | ICD-10-CM | POA: Diagnosis present

## 2015-03-02 DIAGNOSIS — Z87891 Personal history of nicotine dependence: Secondary | ICD-10-CM | POA: Insufficient documentation

## 2015-03-02 DIAGNOSIS — Z79899 Other long term (current) drug therapy: Secondary | ICD-10-CM | POA: Diagnosis not present

## 2015-03-02 DIAGNOSIS — I10 Essential (primary) hypertension: Secondary | ICD-10-CM | POA: Insufficient documentation

## 2015-03-02 DIAGNOSIS — Z7902 Long term (current) use of antithrombotics/antiplatelets: Secondary | ICD-10-CM | POA: Diagnosis not present

## 2015-03-02 DIAGNOSIS — L97519 Non-pressure chronic ulcer of other part of right foot with unspecified severity: Secondary | ICD-10-CM

## 2015-03-02 LAB — CBC WITH DIFFERENTIAL/PLATELET
Basophils Absolute: 0 10*3/uL (ref 0.0–0.1)
Basophils Relative: 1 % (ref 0–1)
Eosinophils Absolute: 0.2 10*3/uL (ref 0.0–0.7)
Eosinophils Relative: 3 % (ref 0–5)
HEMATOCRIT: 32.5 % — AB (ref 36.0–46.0)
HEMOGLOBIN: 9.9 g/dL — AB (ref 12.0–15.0)
LYMPHS PCT: 41 % (ref 12–46)
Lymphs Abs: 2.6 10*3/uL (ref 0.7–4.0)
MCH: 28.7 pg (ref 26.0–34.0)
MCHC: 30.5 g/dL (ref 30.0–36.0)
MCV: 94.2 fL (ref 78.0–100.0)
Monocytes Absolute: 0.3 10*3/uL (ref 0.1–1.0)
Monocytes Relative: 5 % (ref 3–12)
NEUTROS PCT: 50 % (ref 43–77)
Neutro Abs: 3.3 10*3/uL (ref 1.7–7.7)
PLATELETS: 280 10*3/uL (ref 150–400)
RBC: 3.45 MIL/uL — AB (ref 3.87–5.11)
RDW: 17.3 % — AB (ref 11.5–15.5)
WBC: 6.4 10*3/uL (ref 4.0–10.5)

## 2015-03-02 LAB — COMPREHENSIVE METABOLIC PANEL
ALT: 17 U/L (ref 14–54)
ANION GAP: 8 (ref 5–15)
AST: 22 U/L (ref 15–41)
Albumin: 3.2 g/dL — ABNORMAL LOW (ref 3.5–5.0)
Alkaline Phosphatase: 189 U/L — ABNORMAL HIGH (ref 38–126)
BUN: 17 mg/dL (ref 6–20)
CALCIUM: 7.3 mg/dL — AB (ref 8.9–10.3)
CHLORIDE: 116 mmol/L — AB (ref 101–111)
CO2: 16 mmol/L — ABNORMAL LOW (ref 22–32)
Creatinine, Ser: 1.74 mg/dL — ABNORMAL HIGH (ref 0.44–1.00)
GFR calc non Af Amer: 28 mL/min — ABNORMAL LOW (ref >60)
GFR, EST AFRICAN AMERICAN: 33 mL/min — AB (ref >60)
GLUCOSE: 67 mg/dL (ref 65–99)
POTASSIUM: 5.1 mmol/L (ref 3.5–5.1)
Sodium: 140 mmol/L (ref 135–145)
TOTAL PROTEIN: 6 g/dL — AB (ref 6.5–8.1)
Total Bilirubin: 0.2 mg/dL — ABNORMAL LOW (ref 0.3–1.2)

## 2015-03-02 LAB — MAGNESIUM: Magnesium: 2 mg/dL (ref 1.7–2.4)

## 2015-03-02 LAB — LACTIC ACID, PLASMA: Lactic Acid, Venous: 0.6 mmol/L (ref 0.5–2.0)

## 2015-03-02 MED ORDER — MORPHINE SULFATE 4 MG/ML IJ SOLN
6.0000 mg | Freq: Once | INTRAMUSCULAR | Status: AC
  Administered 2015-03-02: 6 mg via INTRAVENOUS
  Filled 2015-03-02: qty 2

## 2015-03-02 MED ORDER — OXYCODONE-ACETAMINOPHEN 5-325 MG PO TABS
1.0000 | ORAL_TABLET | Freq: Once | ORAL | Status: AC
  Administered 2015-03-02: 1 via ORAL
  Filled 2015-03-02: qty 1

## 2015-03-02 MED ORDER — IBUPROFEN 200 MG PO TABS
600.0000 mg | ORAL_TABLET | Freq: Once | ORAL | Status: AC
  Administered 2015-03-02: 600 mg via ORAL
  Filled 2015-03-02 (×2): qty 1

## 2015-03-02 MED ORDER — SODIUM CHLORIDE 0.9 % IV BOLUS (SEPSIS)
1000.0000 mL | Freq: Once | INTRAVENOUS | Status: AC
  Administered 2015-03-02: 1000 mL via INTRAVENOUS

## 2015-03-02 NOTE — Discharge Instructions (Signed)
Skin Ulcer  A skin ulcer is an open sore that can be shallow or deep. Skin ulcers sometimes become infected and are difficult to treat. It may be 1 month or longer before real healing progress is made.  CAUSES    Injury.   Problems with the veins or arteries.   Diabetes.   Insect bites.   Bedsores.   Inflammatory conditions.  SYMPTOMS    Pain, redness, swelling, and tenderness around the ulcer.   Fever.   Bleeding from the ulcer.   Yellow or clear fluid coming from the ulcer.  DIAGNOSIS   There are many types of skin ulcers. Any open sores will be examined. Certain tests will be done to determine the kind of ulcer you have. The right treatment depends on the type of ulcer you have.  TREATMENT   Treatment is a long-term challenge. It may include:   Wearing an elastic wrap, compression stockings, or gel cast over the ulcer area.   Taking antibiotic medicines or putting antibiotic creams on the affected area if there is an infection.  HOME CARE INSTRUCTIONS   Put on your bandages (dressings), wraps, or casts over the ulcer as directed by your caregiver.   Change all dressings as directed by your caregiver.   Take all medicines as directed by your caregiver.   Keep the affected area clean and dry.   Avoid injuries to the affected area.   Eat a well-balanced, healthy diet that includes plenty of fruit and vegetables.   If you smoke, consider quitting or decreasing the amount of cigarettes you smoke.   Once the ulcer heals, get regular exercise as directed by your caregiver.   Work with your caregiver to make sure your blood pressure, cholesterol, and diabetes are well-controlled.   Keep your skin moisturized. Dry skin can crack and lead to skin ulcers.  SEEK IMMEDIATE MEDICAL CARE IF:    Your pain gets worse.   You have swelling, redness, or fluids around the ulcer.   You have chills.   You have a fever.  MAKE SURE YOU:    Understand these instructions.   Will watch your condition.   Will get  help right away if you are not doing well or get worse.  Document Released: 10/01/2004 Document Revised: 11/16/2011 Document Reviewed: 04/10/2011  ExitCare Patient Information 2015 [employer]. This information is not intended to replace advice given to you by your health care provider. Make sure you discuss any questions you have with your health care provider.

## 2015-03-02 NOTE — ED Notes (Signed)
Pt had blood work done on Thursday and was called on Friday to say that her potassium level was low and that she needed to come to the ED for IV medication. Pt also reports that she has a wound on right foot x 5 months. Pt has a nurse that comes to the house 3 days a week and the nurse is requesting that the Dr look at her wound because it is not getting any better.

## 2015-03-06 NOTE — ED Provider Notes (Signed)
CSN: [id number]     Arrival date & time 03/02/15  [id number] History   First MD Initiated Contact with Patient 03/02/15 [phone]     Chief Complaint  Patient presents with  . Abnormal Lab  . Wound Check     (Consider location/radiation/quality/duration/timing/severity/associated sxs/prior Treatment) HPI   74 year old female presenting to the emergency room after being told she had a low potassium level. Peristalsis was done on routine blood work. Only complaint is of right foot wound which is chronic. Has home nursing come 3 times a week for it. Has also been referred to the wound center but has not had her first appointment yet. She says that this wound is not improving. Denies any acute change in pain, any fevers or chills or any drainage.  Past Medical History  Diagnosis Date  . Diabetes mellitus without complication   . Peripheral vascular disease   . Hypertension   . Diabetic foot ulcers     RIGHT   . Arthritis    Past Surgical History  Procedure Laterality Date  . Foot surgery    . Peripheral vascular catheterization N/A 01/29/2015    Procedure: Abdominal Aortogram w/Lower Extremity;  Surgeon: Serafina Mitchell, MD;  Location: [facility];  Service: Cardiovascular;  Laterality: N/A;  . Abdominal aortagram  01/29/2015  . Atherectomy Right 01/29/2015    FEMORAL ARTERY   . Balloon angioplasty, artery Right 01/29/2015    RT FEMORAL    Family History  Problem Relation Age of Onset  . Hypertension Mother   . Hypertension Father    History  Substance Use Topics  . Smoking status: Former Smoker    Types: Cigarettes    Quit date: 02/13/2014  . Smokeless tobacco: Never Used     Comment: " I quit smoking along time ago "  . Alcohol Use: No   OB History    No data available     Review of Systems  All systems reviewed and negative, other than as noted in HPI.   Allergies  Review of patient's allergies indicates no known allergies.  Home Medications   Prior to Admission  medications   Medication Sig Start Date End Date Taking? Authorizing Provider  ADVAIR DISKUS 250-50 MCG/DOSE AEPB Inhale 1 puff into the lungs daily as needed (Allergies).  11/02/14  Yes Historical Provider, MD  amLODipine (NORVASC) 5 MG tablet Take 5 mg by mouth daily. 11/02/14  Yes Historical Provider, MD  clopidogrel (PLAVIX) 75 MG tablet Take 75 mg by mouth daily.  02/07/15  Yes Historical Provider, MD  Cyanocobalamin (VITAMIN B-12) 5000 MCG SUBL Place 5,000 mcg under the tongue daily.    Yes Historical Provider, MD  LANTUS 100 UNIT/ML injection Take 35 Units by mouth every morning. 12/17/14  Yes Historical Provider, MD  lisinopril (PRINIVIL,ZESTRIL) 10 MG tablet Take 10 mg by mouth daily.   Yes Historical Provider, MD  naproxen sodium (ANAPROX) 220 MG tablet Take 220 mg by mouth 2 (two) times daily as needed (Pain).   Yes Historical Provider, MD  Omega-3 Fatty Acids (FISH OIL PO) Take 1 capsule by mouth daily.   Yes Historical Provider, MD  omeprazole (PRILOSEC) 20 MG capsule Take 20 mg by mouth daily.   Yes Historical Provider, MD  OVER THE COUNTER MEDICATION Place 1 spray into the nose daily as needed (Allergies). OTC nasal spray   Yes Historical Provider, MD  pravastatin (PRAVACHOL) 40 MG tablet Take 40 mg by mouth at bedtime. 12/25/14  Yes Historical  Provider, MD  PROAIR HFA 108 (90 BASE) MCG/ACT inhaler Inhale 2 puffs into the lungs every 4 (four) hours as needed. 11/02/14  Yes Historical Provider, MD  tetrahydrozoline 0.05 % ophthalmic solution Place 1 drop into both eyes 2 (two) times daily as needed (Eye drops).   Yes Historical Provider, MD  ALPRAZolam Duanne Moron) 1 MG tablet Take 1 mg by mouth at bedtime as needed for sleep.  11/02/14   Historical Provider, MD  HYDROcodone-acetaminophen (NORCO) 10-325 MG per tablet Take 1 tablet by mouth every 6 (six) hours as needed.  11/20/14   Historical Provider, MD   BP 145/77 mmHg  Pulse 95  Temp(Src) 98.4 F (36.9 C) (Oral)  Resp 16  Ht 5\' 9"  (1.753  m)  Wt 188 lb (85.276 kg)  BMI 27.75 kg/m2  SpO2 100% Physical Exam  Constitutional: She appears well-developed and well-nourished. No distress.  HENT:  Head: Normocephalic and atraumatic.  Eyes: Conjunctivae are normal. Right eye exhibits no discharge. Left eye exhibits no discharge.  Neck: Neck supple.  Cardiovascular: Normal rate, regular rhythm and normal heart sounds.  Exam reveals no gallop and no friction rub.   No murmur heard. Pulmonary/Chest: Effort normal and breath sounds normal. No respiratory distress.  Abdominal: Soft. She exhibits no distension. There is no tenderness.  Musculoskeletal: She exhibits no edema or tenderness.  Prior grafting? to right lateral foot. Just posterior to this there is an ulceration. No drainage. No surrounding cellulitis.  Neurological: She is alert.  Skin: Skin is warm and dry.  Psychiatric: She has a normal mood and affect. Her behavior is normal. Thought content normal.  Nursing note and vitals reviewed.   ED Course  Procedures (including critical care time) Labs Review Labs Reviewed  COMPREHENSIVE METABOLIC PANEL - Abnormal; Notable for the following:    Chloride 116 (*)    CO2 16 (*)    Creatinine, Ser 1.74 (*)    Calcium 7.3 (*)    Total Protein 6.0 (*)    Albumin 3.2 (*)    Alkaline Phosphatase 189 (*)    Total Bilirubin 0.2 (*)    GFR calc non Af Amer 28 (*)    GFR calc Af Amer 33 (*)    All other components within normal limits  CBC WITH DIFFERENTIAL/PLATELET - Abnormal; Notable for the following:    RBC 3.45 (*)    Hemoglobin 9.9 (*)    HCT 32.5 (*)    RDW 17.3 (*)    All other components within normal limits  MAGNESIUM  LACTIC ACID, PLASMA    Imaging Review No results found.   EKG Interpretation   Date/Time:  Saturday March 02 2015 09:59:04 EDT Ventricular Rate:  92 PR Interval:  205 QRS Duration: 79 QT Interval:  371 QTC Calculation: 459 R Axis:   30 Text Interpretation:  Sinus rhythm Low voltage,  precordial leads ED  PHYSICIAN INTERPRETATION AVAILABLE IN [facility] Confirmed by TEST,  Record ([id number]) on 03/04/2015 7:00:12 AM      MDM   Final diagnoses:  Right foot ulcer  Anemia, unspecified anemia type  Metabolic acidosis        Virgel Manifold, MD 03/06/15 0403

## 2015-03-18 ENCOUNTER — Encounter (HOSPITAL_BASED_OUTPATIENT_CLINIC_OR_DEPARTMENT_OTHER): Payer: Medicare HMO | Attending: Plastic Surgery

## 2015-03-18 DIAGNOSIS — Z794 Long term (current) use of insulin: Secondary | ICD-10-CM | POA: Insufficient documentation

## 2015-03-18 DIAGNOSIS — Z7902 Long term (current) use of antithrombotics/antiplatelets: Secondary | ICD-10-CM | POA: Insufficient documentation

## 2015-03-18 DIAGNOSIS — Z7951 Long term (current) use of inhaled steroids: Secondary | ICD-10-CM | POA: Insufficient documentation

## 2015-03-18 DIAGNOSIS — I10 Essential (primary) hypertension: Secondary | ICD-10-CM | POA: Insufficient documentation

## 2015-03-18 DIAGNOSIS — Z79891 Long term (current) use of opiate analgesic: Secondary | ICD-10-CM | POA: Insufficient documentation

## 2015-03-18 DIAGNOSIS — E11621 Type 2 diabetes mellitus with foot ulcer: Secondary | ICD-10-CM | POA: Insufficient documentation

## 2015-03-18 DIAGNOSIS — L97311 Non-pressure chronic ulcer of right ankle limited to breakdown of skin: Secondary | ICD-10-CM | POA: Insufficient documentation

## 2015-03-18 DIAGNOSIS — Z792 Long term (current) use of antibiotics: Secondary | ICD-10-CM | POA: Insufficient documentation

## 2015-03-18 DIAGNOSIS — Z79899 Other long term (current) drug therapy: Secondary | ICD-10-CM | POA: Insufficient documentation

## 2015-03-18 DIAGNOSIS — E11622 Type 2 diabetes mellitus with other skin ulcer: Secondary | ICD-10-CM | POA: Insufficient documentation

## 2015-03-18 DIAGNOSIS — E1151 Type 2 diabetes mellitus with diabetic peripheral angiopathy without gangrene: Secondary | ICD-10-CM | POA: Insufficient documentation

## 2015-03-18 DIAGNOSIS — M1990 Unspecified osteoarthritis, unspecified site: Secondary | ICD-10-CM | POA: Insufficient documentation

## 2015-03-18 DIAGNOSIS — L97411 Non-pressure chronic ulcer of right heel and midfoot limited to breakdown of skin: Secondary | ICD-10-CM | POA: Insufficient documentation

## 2015-04-03 DIAGNOSIS — L97311 Non-pressure chronic ulcer of right ankle limited to breakdown of skin: Secondary | ICD-10-CM | POA: Diagnosis not present

## 2015-04-03 DIAGNOSIS — Z7951 Long term (current) use of inhaled steroids: Secondary | ICD-10-CM | POA: Diagnosis not present

## 2015-04-03 DIAGNOSIS — L97411 Non-pressure chronic ulcer of right heel and midfoot limited to breakdown of skin: Secondary | ICD-10-CM | POA: Diagnosis not present

## 2015-04-03 DIAGNOSIS — I10 Essential (primary) hypertension: Secondary | ICD-10-CM | POA: Diagnosis not present

## 2015-04-03 DIAGNOSIS — Z792 Long term (current) use of antibiotics: Secondary | ICD-10-CM | POA: Diagnosis not present

## 2015-04-03 DIAGNOSIS — M1990 Unspecified osteoarthritis, unspecified site: Secondary | ICD-10-CM | POA: Diagnosis not present

## 2015-04-03 DIAGNOSIS — Z794 Long term (current) use of insulin: Secondary | ICD-10-CM | POA: Diagnosis not present

## 2015-04-03 DIAGNOSIS — E1151 Type 2 diabetes mellitus with diabetic peripheral angiopathy without gangrene: Secondary | ICD-10-CM | POA: Diagnosis not present

## 2015-04-03 DIAGNOSIS — E11622 Type 2 diabetes mellitus with other skin ulcer: Secondary | ICD-10-CM | POA: Diagnosis not present

## 2015-04-03 DIAGNOSIS — Z7902 Long term (current) use of antithrombotics/antiplatelets: Secondary | ICD-10-CM | POA: Diagnosis not present

## 2015-04-03 DIAGNOSIS — Z79891 Long term (current) use of opiate analgesic: Secondary | ICD-10-CM | POA: Diagnosis not present

## 2015-04-03 DIAGNOSIS — Z79899 Other long term (current) drug therapy: Secondary | ICD-10-CM | POA: Diagnosis not present

## 2015-04-03 DIAGNOSIS — E11621 Type 2 diabetes mellitus with foot ulcer: Secondary | ICD-10-CM | POA: Diagnosis present

## 2015-04-06 ENCOUNTER — Ambulatory Visit (HOSPITAL_COMMUNITY)
Admission: RE | Admit: 2015-04-06 | Discharge: 2015-04-06 | Disposition: A | Discharge status: Home / Self Care | Payer: Medicare HMO | Source: Ambulatory Visit | Attending: Surgery | Admitting: Surgery

## 2015-04-06 ENCOUNTER — Emergency Department (HOSPITAL_COMMUNITY)
Admission: EM | Admit: 2015-04-06 | Discharge: 2015-04-06 | Disposition: A | Discharge status: Home / Self Care | Payer: Medicare HMO

## 2015-04-06 ENCOUNTER — Other Ambulatory Visit: Payer: Self-pay | Admitting: Surgery

## 2015-04-06 DIAGNOSIS — L97511 Non-pressure chronic ulcer of other part of right foot limited to breakdown of skin: Secondary | ICD-10-CM | POA: Insufficient documentation

## 2015-04-06 DIAGNOSIS — M2141 Flat foot [pes planus] (acquired), right foot: Secondary | ICD-10-CM | POA: Diagnosis not present

## 2015-04-06 DIAGNOSIS — M8580 Other specified disorders of bone density and structure, unspecified site: Secondary | ICD-10-CM | POA: Diagnosis not present

## 2015-04-10 ENCOUNTER — Encounter (HOSPITAL_BASED_OUTPATIENT_CLINIC_OR_DEPARTMENT_OTHER): Payer: Medicare HMO | Attending: Surgery

## 2015-04-10 DIAGNOSIS — E11621 Type 2 diabetes mellitus with foot ulcer: Secondary | ICD-10-CM | POA: Insufficient documentation

## 2015-04-10 DIAGNOSIS — M85871 Other specified disorders of bone density and structure, right ankle and foot: Secondary | ICD-10-CM | POA: Insufficient documentation

## 2015-04-10 DIAGNOSIS — L97311 Non-pressure chronic ulcer of right ankle limited to breakdown of skin: Secondary | ICD-10-CM | POA: Insufficient documentation

## 2015-04-10 DIAGNOSIS — I10 Essential (primary) hypertension: Secondary | ICD-10-CM | POA: Insufficient documentation

## 2015-04-10 DIAGNOSIS — L84 Corns and callosities: Secondary | ICD-10-CM | POA: Insufficient documentation

## 2015-04-10 DIAGNOSIS — L97411 Non-pressure chronic ulcer of right heel and midfoot limited to breakdown of skin: Secondary | ICD-10-CM | POA: Insufficient documentation

## 2015-04-10 DIAGNOSIS — M1990 Unspecified osteoarthritis, unspecified site: Secondary | ICD-10-CM | POA: Insufficient documentation

## 2015-04-10 DIAGNOSIS — E11622 Type 2 diabetes mellitus with other skin ulcer: Secondary | ICD-10-CM | POA: Insufficient documentation

## 2015-05-01 DIAGNOSIS — M1990 Unspecified osteoarthritis, unspecified site: Secondary | ICD-10-CM | POA: Diagnosis not present

## 2015-05-01 DIAGNOSIS — L97311 Non-pressure chronic ulcer of right ankle limited to breakdown of skin: Secondary | ICD-10-CM | POA: Diagnosis not present

## 2015-05-01 DIAGNOSIS — M85871 Other specified disorders of bone density and structure, right ankle and foot: Secondary | ICD-10-CM | POA: Diagnosis not present

## 2015-05-01 DIAGNOSIS — E11622 Type 2 diabetes mellitus with other skin ulcer: Secondary | ICD-10-CM | POA: Diagnosis not present

## 2015-05-01 DIAGNOSIS — E11621 Type 2 diabetes mellitus with foot ulcer: Secondary | ICD-10-CM | POA: Diagnosis present

## 2015-05-01 DIAGNOSIS — I10 Essential (primary) hypertension: Secondary | ICD-10-CM | POA: Diagnosis not present

## 2015-05-01 DIAGNOSIS — L84 Corns and callosities: Secondary | ICD-10-CM | POA: Diagnosis not present

## 2015-05-01 DIAGNOSIS — L97411 Non-pressure chronic ulcer of right heel and midfoot limited to breakdown of skin: Secondary | ICD-10-CM | POA: Diagnosis not present

## 2015-05-08 DIAGNOSIS — E11621 Type 2 diabetes mellitus with foot ulcer: Secondary | ICD-10-CM | POA: Diagnosis not present

## 2015-05-15 ENCOUNTER — Encounter (HOSPITAL_BASED_OUTPATIENT_CLINIC_OR_DEPARTMENT_OTHER): Payer: Medicare HMO | Attending: Surgery

## 2015-05-15 DIAGNOSIS — I10 Essential (primary) hypertension: Secondary | ICD-10-CM | POA: Insufficient documentation

## 2015-05-15 DIAGNOSIS — L97412 Non-pressure chronic ulcer of right heel and midfoot with fat layer exposed: Secondary | ICD-10-CM | POA: Diagnosis not present

## 2015-05-15 DIAGNOSIS — L97512 Non-pressure chronic ulcer of other part of right foot with fat layer exposed: Secondary | ICD-10-CM | POA: Insufficient documentation

## 2015-05-15 DIAGNOSIS — M1990 Unspecified osteoarthritis, unspecified site: Secondary | ICD-10-CM | POA: Diagnosis not present

## 2015-05-15 DIAGNOSIS — E11621 Type 2 diabetes mellitus with foot ulcer: Secondary | ICD-10-CM | POA: Insufficient documentation

## 2015-05-15 DIAGNOSIS — I70233 Atherosclerosis of native arteries of right leg with ulceration of ankle: Secondary | ICD-10-CM | POA: Diagnosis not present

## 2015-05-17 ENCOUNTER — Encounter: Payer: Self-pay | Admitting: Surgery

## 2015-05-20 ENCOUNTER — Ambulatory Visit: Payer: Medicare HMO | Admitting: Surgery

## 2015-05-20 ENCOUNTER — Encounter (HOSPITAL_COMMUNITY): Payer: Medicare HMO

## 2015-05-20 ENCOUNTER — Inpatient Hospital Stay (HOSPITAL_COMMUNITY): Admission: RE | Admit: 2015-05-20 | Payer: Medicare HMO | Source: Ambulatory Visit

## 2015-05-22 DIAGNOSIS — L97512 Non-pressure chronic ulcer of other part of right foot with fat layer exposed: Secondary | ICD-10-CM | POA: Diagnosis not present

## 2015-05-24 ENCOUNTER — Ambulatory Visit (INDEPENDENT_AMBULATORY_CARE_PROVIDER_SITE_OTHER)
Admission: RE | Admit: 2015-05-24 | Discharge: 2015-05-24 | Disposition: A | Discharge status: Home / Self Care | Payer: Medicare HMO | Source: Ambulatory Visit | Attending: Vascular Surgery | Admitting: Vascular Surgery

## 2015-05-24 ENCOUNTER — Ambulatory Visit (HOSPITAL_COMMUNITY)
Admission: RE | Admit: 2015-05-24 | Discharge: 2015-05-24 | Disposition: A | Discharge status: Home / Self Care | Payer: Medicare HMO | Source: Ambulatory Visit | Attending: Vascular Surgery | Admitting: Vascular Surgery

## 2015-05-24 DIAGNOSIS — Z9862 Peripheral vascular angioplasty status: Secondary | ICD-10-CM | POA: Insufficient documentation

## 2015-05-24 DIAGNOSIS — I739 Peripheral vascular disease, unspecified: Secondary | ICD-10-CM

## 2015-05-24 DIAGNOSIS — I10 Essential (primary) hypertension: Secondary | ICD-10-CM | POA: Insufficient documentation

## 2015-05-24 DIAGNOSIS — E119 Type 2 diabetes mellitus without complications: Secondary | ICD-10-CM | POA: Insufficient documentation

## 2015-05-24 DIAGNOSIS — Z87891 Personal history of nicotine dependence: Secondary | ICD-10-CM | POA: Insufficient documentation

## 2015-05-29 ENCOUNTER — Encounter: Payer: Self-pay | Admitting: Surgery

## 2015-05-31 ENCOUNTER — Encounter (HOSPITAL_COMMUNITY): Payer: Medicare HMO

## 2015-06-03 ENCOUNTER — Ambulatory Visit (INDEPENDENT_AMBULATORY_CARE_PROVIDER_SITE_OTHER): Payer: Medicare HMO | Admitting: Surgery

## 2015-06-03 ENCOUNTER — Encounter: Payer: Self-pay | Admitting: Surgery

## 2015-06-03 VITALS — BP 122/66 | HR 80 | Temp 98.9°F | Ht 69.0 in | Wt 178.0 lb

## 2015-06-03 DIAGNOSIS — I70299 Other atherosclerosis of native arteries of extremities, unspecified extremity: Secondary | ICD-10-CM

## 2015-06-03 DIAGNOSIS — L97909 Non-pressure chronic ulcer of unspecified part of unspecified lower leg with unspecified severity: Secondary | ICD-10-CM

## 2015-06-03 NOTE — Progress Notes (Signed)
Patient name: Stephanie Olson MRN: [id number] DOB: [birth year] Sex: female     Chief Complaint  Patient presents with  . Re-evaluation    3 month f/u - c/o rt foot pain but has improved since last OV    HISTORY OF PRESENT ILLNESS:  The patient is back for follow-up. On 01/29/2015, she underwent atherectomy and drug coated balloon angioplasty to the right superficial femoral artery.  This was done for a nonhealing wound on her right lateral ankle.  She continues to be evaluated at the wound center with performing weekly wound care.  She still has significant pain in this area.  Past Medical History  Diagnosis Date  . Diabetes mellitus without complication   . Peripheral vascular disease   . Hypertension   . Diabetic foot ulcers     RIGHT   . Arthritis     Past Surgical History  Procedure Laterality Date  . Foot surgery    . Peripheral vascular catheterization N/A 01/29/2015    Procedure: Abdominal Aortogram w/Lower Extremity;  Surgeon: Serafina Mitchell, MD;  Location: [facility];  Service: Cardiovascular;  Laterality: N/A;  . Abdominal aortagram  01/29/2015  . Atherectomy Right 01/29/2015    FEMORAL ARTERY   . Balloon angioplasty, artery Right 01/29/2015    RT FEMORAL     Social History   Social History  . Marital Status: Divorced    Spouse Name: N/A  . Number of Children: N/A  . Years of Education: N/A   Occupational History  . Not on file.   Social History Main Topics  . Smoking status: Former Smoker    Types: Cigarettes    Quit date: 02/13/2014  . Smokeless tobacco: Never Used     Comment: " I quit smoking along time ago "  . Alcohol Use: No  . Drug Use: No  . Sexual Activity: Not on file   Other Topics Concern  . Not on file   Social History Narrative    Family History  Problem Relation Age of Onset  . Hypertension Mother   . Hypertension Father     Allergies as of 06/03/2015  . (No Known Allergies)    Current Outpatient Prescriptions  on File Prior to Visit  Medication Sig Dispense Refill  . ADVAIR DISKUS 250-50 MCG/DOSE AEPB Inhale 1 puff into the lungs daily as needed (Allergies).   0  . ALPRAZolam (XANAX) 1 MG tablet Take 1 mg by mouth at bedtime as needed for sleep.     Marland Kitchen amLODipine (NORVASC) 5 MG tablet Take 5 mg by mouth daily.  0  . LANTUS 100 UNIT/ML injection Take 35 Units by mouth every morning.  0  . naproxen sodium (ANAPROX) 220 MG tablet Take 220 mg by mouth 2 (two) times daily as needed (Pain).    . Omega-3 Fatty Acids (FISH OIL PO) Take 1 capsule by mouth daily.    Marland Kitchen omeprazole (PRILOSEC) 20 MG capsule Take 20 mg by mouth daily.    Marland Kitchen OVER THE COUNTER MEDICATION Place 1 spray into the nose daily as needed (Allergies). OTC nasal spray    . pravastatin (PRAVACHOL) 40 MG tablet Take 40 mg by mouth at bedtime.  0  . PROAIR HFA 108 (90 BASE) MCG/ACT inhaler Inhale 2 puffs into the lungs every 4 (four) hours as needed.  0  . clopidogrel (PLAVIX) 75 MG tablet Take 75 mg by mouth daily.     . Cyanocobalamin (VITAMIN B-12) 5000  MCG SUBL Place 5,000 mcg under the tongue daily.     Marland Kitchen HYDROcodone-acetaminophen (NORCO) 10-325 MG per tablet Take 1 tablet by mouth every 6 (six) hours as needed.   0  . lisinopril (PRINIVIL,ZESTRIL) 10 MG tablet Take 10 mg by mouth daily.    Marland Kitchen tetrahydrozoline 0.05 % ophthalmic solution Place 1 drop into both eyes 2 (two) times daily as needed (Eye drops).     No current facility-administered medications on file prior to visit.     REVIEW OF SYSTEMS:  see history of present illness, otherwise all negative  PHYSICAL EXAMINATION:   Vital signs are  Filed Vitals:   06/03/15 1035  BP: 122/66  Pulse: 80  Temp: 98.9 F (37.2 C)  TempSrc: Oral  Height: 5\' 9"  (1.753 m)  Weight: 178 lb (80.74 kg)  SpO2: 100%   Body mass index is 26.27 kg/(m^2). General: The patient appears their stated age. HEENT:  No gross abnormalities Pulmonary:  Non labored breathing Musculoskeletal: There are no  major deformities. Neurologic: No focal weakness or paresthesias are detected, Skin:  2 x 2 centimeters superficial ulcer with pink granulation tissue at the base.  No significant erythema. Psychiatric: The patient has normal affect. Cardiovascular: There is a regular rate and rhythm without significant murmur appreciated.   Diagnostic Studies  I have reviewed her ultrasound.  This shows a patent right lower extremity with less than 50% stenosis.  Ankle-brachial index is 1.0.  No velocity elevations and treated area.  Assessment:  right lower extremity atherosclerosis with ulcer. Plan:  ultrasound shows that her site of intervention remains widely patent.  She has normal ankle-brachial indices.  She should continue with wound care as this is progressing , however relatively slowly.  The wound actually looks very healthy.  I'm going to have her come back in 6 months for repeat ultrasound to evaluate the patency of her  Intervention.  Eldridge Abrahams, M.D. [facility] Office: [phone] Pager:  [phone]

## 2015-06-05 ENCOUNTER — Telehealth: Payer: Self-pay

## 2015-06-05 DIAGNOSIS — I739 Peripheral vascular disease, unspecified: Secondary | ICD-10-CM

## 2015-06-05 DIAGNOSIS — L97512 Non-pressure chronic ulcer of other part of right foot with fat layer exposed: Secondary | ICD-10-CM | POA: Diagnosis not present

## 2015-06-05 DIAGNOSIS — Z9862 Peripheral vascular angioplasty status: Secondary | ICD-10-CM

## 2015-06-05 MED ORDER — CLOPIDOGREL BISULFATE 75 MG PO TABS: 75.0000 mg | ORAL_TABLET | Freq: Every day | ORAL | Status: DC

## 2015-06-05 NOTE — Telephone Encounter (Signed)
Phone call from [facility] RN.  Reported the pt. was of the understanding that she should be taking a blood thinner.  Noted on progress note of 02/14/15, Plavix was ordered on 5/24 at the [facility] and then the Rx was transferred to [facility], and pt's Grandaughter was made aware of this.  Per Larena Glassman, [facility] RN, the pt. hasn't been taking Plavix, as it never was picked up.   Also, pt's [facility] RN requested the pt's record be edited to reflect change in pharmacy to [facility]; noted this in the EMR.  Advised the [facility] RN, this nurse will send Rx electronically to the [facility].  [place] RN verb. Understanding; stated she will follow-up, and make sure pt. starts taking Plavix 75 mg daily.

## 2015-06-12 ENCOUNTER — Encounter (HOSPITAL_BASED_OUTPATIENT_CLINIC_OR_DEPARTMENT_OTHER): Payer: Medicare HMO | Attending: Surgery

## 2015-06-12 DIAGNOSIS — E11621 Type 2 diabetes mellitus with foot ulcer: Secondary | ICD-10-CM | POA: Insufficient documentation

## 2015-06-12 DIAGNOSIS — E11622 Type 2 diabetes mellitus with other skin ulcer: Secondary | ICD-10-CM | POA: Diagnosis present

## 2015-06-12 DIAGNOSIS — L97311 Non-pressure chronic ulcer of right ankle limited to breakdown of skin: Secondary | ICD-10-CM | POA: Diagnosis not present

## 2015-06-12 DIAGNOSIS — I10 Essential (primary) hypertension: Secondary | ICD-10-CM | POA: Insufficient documentation

## 2015-06-12 DIAGNOSIS — M1990 Unspecified osteoarthritis, unspecified site: Secondary | ICD-10-CM | POA: Insufficient documentation

## 2015-06-12 DIAGNOSIS — L84 Corns and callosities: Secondary | ICD-10-CM | POA: Insufficient documentation

## 2015-06-12 DIAGNOSIS — L97411 Non-pressure chronic ulcer of right heel and midfoot limited to breakdown of skin: Secondary | ICD-10-CM | POA: Insufficient documentation

## 2015-06-19 DIAGNOSIS — E11622 Type 2 diabetes mellitus with other skin ulcer: Secondary | ICD-10-CM | POA: Diagnosis not present

## 2015-07-03 DIAGNOSIS — E11622 Type 2 diabetes mellitus with other skin ulcer: Secondary | ICD-10-CM | POA: Diagnosis not present

## 2015-07-10 ENCOUNTER — Encounter (HOSPITAL_BASED_OUTPATIENT_CLINIC_OR_DEPARTMENT_OTHER): Payer: Medicare HMO | Attending: Surgery

## 2015-07-10 DIAGNOSIS — E11621 Type 2 diabetes mellitus with foot ulcer: Secondary | ICD-10-CM | POA: Diagnosis present

## 2015-07-10 DIAGNOSIS — I70233 Atherosclerosis of native arteries of right leg with ulceration of ankle: Secondary | ICD-10-CM | POA: Insufficient documentation

## 2015-07-10 DIAGNOSIS — L97311 Non-pressure chronic ulcer of right ankle limited to breakdown of skin: Secondary | ICD-10-CM | POA: Insufficient documentation

## 2015-07-10 DIAGNOSIS — M1990 Unspecified osteoarthritis, unspecified site: Secondary | ICD-10-CM | POA: Insufficient documentation

## 2015-07-10 DIAGNOSIS — I10 Essential (primary) hypertension: Secondary | ICD-10-CM | POA: Insufficient documentation

## 2015-07-10 DIAGNOSIS — L97411 Non-pressure chronic ulcer of right heel and midfoot limited to breakdown of skin: Secondary | ICD-10-CM | POA: Diagnosis not present

## 2015-07-17 DIAGNOSIS — E11621 Type 2 diabetes mellitus with foot ulcer: Secondary | ICD-10-CM | POA: Diagnosis not present

## 2015-07-30 ENCOUNTER — Encounter: Payer: Self-pay | Admitting: *Deleted

## 2015-07-30 DIAGNOSIS — Z006 Encounter for examination for normal comparison and control in clinical research program: Secondary | ICD-10-CM

## 2015-07-30 NOTE — Progress Notes (Signed)
Called Stephanie Olson for her 6 month SAFE-DCB registry follow-up. She has had no additional interventions right lower extremity. I will follow-up again in 6 months.

## 2015-07-31 DIAGNOSIS — E11621 Type 2 diabetes mellitus with foot ulcer: Secondary | ICD-10-CM | POA: Diagnosis not present

## 2015-08-14 ENCOUNTER — Encounter (HOSPITAL_BASED_OUTPATIENT_CLINIC_OR_DEPARTMENT_OTHER): Payer: Medicare HMO | Attending: Surgery

## 2015-08-14 DIAGNOSIS — L97311 Non-pressure chronic ulcer of right ankle limited to breakdown of skin: Secondary | ICD-10-CM | POA: Insufficient documentation

## 2015-08-14 DIAGNOSIS — I70233 Atherosclerosis of native arteries of right leg with ulceration of ankle: Secondary | ICD-10-CM | POA: Insufficient documentation

## 2015-08-14 DIAGNOSIS — M1990 Unspecified osteoarthritis, unspecified site: Secondary | ICD-10-CM | POA: Insufficient documentation

## 2015-08-14 DIAGNOSIS — E11621 Type 2 diabetes mellitus with foot ulcer: Secondary | ICD-10-CM | POA: Insufficient documentation

## 2015-08-14 DIAGNOSIS — E11622 Type 2 diabetes mellitus with other skin ulcer: Secondary | ICD-10-CM | POA: Diagnosis not present

## 2015-08-14 DIAGNOSIS — I10 Essential (primary) hypertension: Secondary | ICD-10-CM | POA: Insufficient documentation

## 2015-08-14 DIAGNOSIS — L97421 Non-pressure chronic ulcer of left heel and midfoot limited to breakdown of skin: Secondary | ICD-10-CM | POA: Insufficient documentation

## 2015-09-11 ENCOUNTER — Encounter (HOSPITAL_BASED_OUTPATIENT_CLINIC_OR_DEPARTMENT_OTHER): Payer: Medicare HMO | Attending: Surgery

## 2015-09-11 DIAGNOSIS — M1990 Unspecified osteoarthritis, unspecified site: Secondary | ICD-10-CM | POA: Diagnosis not present

## 2015-09-11 DIAGNOSIS — L97311 Non-pressure chronic ulcer of right ankle limited to breakdown of skin: Secondary | ICD-10-CM | POA: Diagnosis not present

## 2015-09-11 DIAGNOSIS — E11621 Type 2 diabetes mellitus with foot ulcer: Secondary | ICD-10-CM | POA: Diagnosis present

## 2015-09-11 DIAGNOSIS — I10 Essential (primary) hypertension: Secondary | ICD-10-CM | POA: Diagnosis not present

## 2015-09-11 DIAGNOSIS — L97511 Non-pressure chronic ulcer of other part of right foot limited to breakdown of skin: Secondary | ICD-10-CM | POA: Insufficient documentation

## 2015-09-11 DIAGNOSIS — L84 Corns and callosities: Secondary | ICD-10-CM | POA: Insufficient documentation

## 2015-09-11 DIAGNOSIS — I70233 Atherosclerosis of native arteries of right leg with ulceration of ankle: Secondary | ICD-10-CM | POA: Insufficient documentation

## 2015-10-02 ENCOUNTER — Other Ambulatory Visit: Payer: Self-pay | Admitting: Internal Medicine

## 2015-10-02 ENCOUNTER — Ambulatory Visit (HOSPITAL_COMMUNITY)
Admission: RE | Admit: 2015-10-02 | Discharge: 2015-10-02 | Disposition: A | Discharge status: Home / Self Care | Payer: Medicare HMO | Source: Ambulatory Visit | Attending: Internal Medicine | Admitting: Internal Medicine

## 2015-10-02 DIAGNOSIS — L84 Corns and callosities: Secondary | ICD-10-CM | POA: Insufficient documentation

## 2015-10-02 DIAGNOSIS — L97512 Non-pressure chronic ulcer of other part of right foot with fat layer exposed: Secondary | ICD-10-CM | POA: Insufficient documentation

## 2015-10-02 DIAGNOSIS — M869 Osteomyelitis, unspecified: Secondary | ICD-10-CM

## 2015-10-02 DIAGNOSIS — E11621 Type 2 diabetes mellitus with foot ulcer: Secondary | ICD-10-CM | POA: Diagnosis present

## 2015-10-02 DIAGNOSIS — L97412 Non-pressure chronic ulcer of right heel and midfoot with fat layer exposed: Secondary | ICD-10-CM | POA: Diagnosis not present

## 2015-10-02 DIAGNOSIS — I70233 Atherosclerosis of native arteries of right leg with ulceration of ankle: Secondary | ICD-10-CM | POA: Diagnosis not present

## 2015-10-09 ENCOUNTER — Encounter (HOSPITAL_BASED_OUTPATIENT_CLINIC_OR_DEPARTMENT_OTHER): Payer: Medicare HMO | Attending: Surgery

## 2015-10-09 DIAGNOSIS — M1990 Unspecified osteoarthritis, unspecified site: Secondary | ICD-10-CM | POA: Diagnosis not present

## 2015-10-09 DIAGNOSIS — L97512 Non-pressure chronic ulcer of other part of right foot with fat layer exposed: Secondary | ICD-10-CM | POA: Insufficient documentation

## 2015-10-09 DIAGNOSIS — E11621 Type 2 diabetes mellitus with foot ulcer: Secondary | ICD-10-CM | POA: Insufficient documentation

## 2015-10-09 DIAGNOSIS — L84 Corns and callosities: Secondary | ICD-10-CM | POA: Insufficient documentation

## 2015-10-09 DIAGNOSIS — I10 Essential (primary) hypertension: Secondary | ICD-10-CM | POA: Diagnosis not present

## 2015-10-16 ENCOUNTER — Encounter (HOSPITAL_BASED_OUTPATIENT_CLINIC_OR_DEPARTMENT_OTHER): Payer: Medicare HMO

## 2015-10-16 DIAGNOSIS — E11621 Type 2 diabetes mellitus with foot ulcer: Secondary | ICD-10-CM | POA: Diagnosis not present

## 2015-10-30 DIAGNOSIS — E11621 Type 2 diabetes mellitus with foot ulcer: Secondary | ICD-10-CM | POA: Diagnosis not present

## 2015-11-21 ENCOUNTER — Encounter: Payer: Self-pay | Admitting: Podiatry

## 2015-11-21 ENCOUNTER — Ambulatory Visit (INDEPENDENT_AMBULATORY_CARE_PROVIDER_SITE_OTHER): Payer: Medicare HMO | Admitting: Podiatry

## 2015-11-21 VITALS — BP 157/74 | HR 81 | Ht 69.0 in | Wt 188.0 lb

## 2015-11-21 DIAGNOSIS — M79673 Pain in unspecified foot: Secondary | ICD-10-CM

## 2015-11-21 DIAGNOSIS — E11621 Type 2 diabetes mellitus with foot ulcer: Secondary | ICD-10-CM | POA: Diagnosis not present

## 2015-11-21 DIAGNOSIS — E0842 Diabetes mellitus due to underlying condition with diabetic polyneuropathy: Secondary | ICD-10-CM | POA: Diagnosis not present

## 2015-11-21 DIAGNOSIS — B351 Tinea unguium: Secondary | ICD-10-CM | POA: Diagnosis not present

## 2015-11-21 DIAGNOSIS — L97501 Non-pressure chronic ulcer of other part of unspecified foot limited to breakdown of skin: Secondary | ICD-10-CM | POA: Diagnosis not present

## 2015-11-21 DIAGNOSIS — E1140 Type 2 diabetes mellitus with diabetic neuropathy, unspecified: Secondary | ICD-10-CM | POA: Insufficient documentation

## 2015-11-21 DIAGNOSIS — M21961 Unspecified acquired deformity of right lower leg: Secondary | ICD-10-CM

## 2015-11-21 DIAGNOSIS — M216X9 Other acquired deformities of unspecified foot: Secondary | ICD-10-CM | POA: Diagnosis not present

## 2015-11-21 DIAGNOSIS — M21969 Unspecified acquired deformity of unspecified lower leg: Secondary | ICD-10-CM | POA: Insufficient documentation

## 2015-11-21 NOTE — Progress Notes (Signed)
SUBJECTIVE: 75 y.o. year old female presents for diabetic foot care. Patient is ambulatory assisted by wheeled walker. Referred by Dr. Vista Lawman.  Patient stated that she has had foot surgery and skin graft in 1990. She was followed by [facility] foot center up to last 2 weeks ago before she was released to this facility.  Her last blood sugar level was in 90 yesterday.   REVIEW OF SYSTEMS: A comprehensive review of systems was negative except for: Diabetic, history of foot ulcer, and new lesion under right foot.   OBJECTIVE: DERMATOLOGIC EXAMINATION: Localized pre ulcerative callus with intradermal bleeding under 5th MPJ, 2 cm in diameter, breakdown limited to skin. No drainage or associated cellulitis noted.  Hyperpigmented old skin graft visible at lateral proximal 1/2 of right foot covering about 5 x 10 cm. No acute issued with the grafted skin.  VASCULAR EXAMINATION OF LOWER LIMBS: Pedal pulses: All pedal pulses are palpable with normal pulsation.  No edema or erythema noted. NEUROLOGIC EXAMINATION OF THE LOWER LIMBS: Achilles DTR is present and within normal. Failed to response to Monofilament (Semmes-Weinstein 10-gm) sensory testing bilateral. Vibratory sensations(128Hz  turning fork) intact at medial and lateral forefoot bilateral.  Sharp and Dull discriminatory sensations at the plantar ball of hallux is intact bilateral.   MUSCULOSKELETAL EXAMINATION: Plantar flexed 5th Metatarsal right with skin lesion.   ASSESSMENT: 1. Diabetic foot ulcer under 5th MPJ right. 2. S/P skin graft right posterior lateral ankle. 3. Plantar flexed 5th Metatarsal right. 4. Contracted lesser digits bilateral. 5. Onychomycosis x 10.  6. Diabetic Neuropathy bilateral.   PLAN: Ulcerative lesion debrided and padded with 1/4" felt pad. All mycotic nails debrided. Both feet measured for diabetic shoes.

## 2015-11-21 NOTE — Patient Instructions (Signed)
Seen for diabetic foot care. Both feet measured for diabetic shoes. Ulcerative lesion debrided and padded. Return in 2 weeks.

## 2015-11-28 ENCOUNTER — Encounter: Payer: Self-pay | Admitting: Family

## 2015-11-30 ENCOUNTER — Other Ambulatory Visit: Payer: Self-pay | Admitting: *Deleted

## 2015-11-30 DIAGNOSIS — Z95820 Peripheral vascular angioplasty status with implants and grafts: Secondary | ICD-10-CM

## 2015-11-30 DIAGNOSIS — I739 Peripheral vascular disease, unspecified: Secondary | ICD-10-CM

## 2015-12-02 ENCOUNTER — Encounter (HOSPITAL_COMMUNITY): Payer: Medicare HMO

## 2015-12-02 ENCOUNTER — Ambulatory Visit: Payer: Medicare HMO | Admitting: Family

## 2015-12-05 ENCOUNTER — Ambulatory Visit (INDEPENDENT_AMBULATORY_CARE_PROVIDER_SITE_OTHER): Payer: Medicare HMO | Admitting: Podiatry

## 2015-12-05 ENCOUNTER — Encounter: Payer: Self-pay | Admitting: Podiatry

## 2015-12-05 DIAGNOSIS — E0842 Diabetes mellitus due to underlying condition with diabetic polyneuropathy: Secondary | ICD-10-CM

## 2015-12-05 DIAGNOSIS — E11621 Type 2 diabetes mellitus with foot ulcer: Secondary | ICD-10-CM | POA: Diagnosis not present

## 2015-12-05 DIAGNOSIS — L97501 Non-pressure chronic ulcer of other part of unspecified foot limited to breakdown of skin: Secondary | ICD-10-CM | POA: Diagnosis not present

## 2015-12-05 MED ORDER — HYDROCODONE-ACETAMINOPHEN 5-325 MG PO TABS: 1.0000 | ORAL_TABLET | Freq: Four times a day (QID) | ORAL | Status: DC | PRN

## 2015-12-05 NOTE — Progress Notes (Signed)
SUBJECTIVE: 75 y.o. year old female presents for diabetic foot care. Patient is ambulatory assisted by wheeled walker. Referred by Dr. Vista Lawman.  Patient stated that she has had foot surgery and skin graft in 1990. She was followed by [facility] foot center up to last 2 weeks ago before she was released to this facility.  Her last blood sugar level was in 90 yesterday.   REVIEW OF SYSTEMS: A comprehensive review of systems was negative except for: Diabetic, history of foot ulcer, and new lesion under right foot.   OBJECTIVE: DERMATOLOGIC EXAMINATION: Localized pre ulcerative callus with intradermal bleeding under 5th MPJ, 2 cm in diameter, breakdown limited to skin. No drainage or associated cellulitis noted.  Hyperpigmented old skin graft visible at lateral proximal 1/2 of right foot covering about 5 x 10 cm. No acute issued with the grafted skin.  VASCULAR EXAMINATION OF LOWER LIMBS: Pedal pulses: All pedal pulses are palpable with normal pulsation.  No edema or erythema noted. NEUROLOGIC EXAMINATION OF THE LOWER LIMBS: Achilles DTR is present and within normal. Failed to response to Monofilament (Semmes-Weinstein 10-gm) sensory testing bilateral. Vibratory sensations(128Hz  turning fork) intact at medial and lateral forefoot bilateral.  Sharp and Dull discriminatory sensations at the plantar ball of hallux is intact bilateral.   MUSCULOSKELETAL EXAMINATION: Plantar flexed 5th Metatarsal right with skin lesion.   ASSESSMENT: 1. Diabetic foot ulcer under 5th MPJ right. 2. S/P skin graft right posterior lateral ankle. 3. Plantar flexed 5th Metatarsal right. 4. Contracted lesser digits bilateral. 5. Onychomycosis x 10.  6. Diabetic Neuropathy bilateral.   PLAN: Ulcerative lesion debrided and padded with 1/4" felt pad. Diabetic shoes dispensed.  Pain medication prescribed as per request.

## 2015-12-05 NOTE — Patient Instructions (Signed)
Ulcer under the 5th MPJ right is progressing well.  Build up callus debrided. Aperture pad placed with Mefix tape. Diabetic shoes dispensed with instruction. Return in 3 weeks.

## 2015-12-06 ENCOUNTER — Encounter (HOSPITAL_COMMUNITY): Payer: Medicare HMO

## 2015-12-06 ENCOUNTER — Ambulatory Visit: Payer: Medicare HMO | Admitting: Family

## 2015-12-06 ENCOUNTER — Encounter (HOSPITAL_COMMUNITY): Payer: Self-pay

## 2015-12-12 ENCOUNTER — Other Ambulatory Visit: Payer: Self-pay | Admitting: Surgery

## 2015-12-12 DIAGNOSIS — I739 Peripheral vascular disease, unspecified: Secondary | ICD-10-CM

## 2015-12-26 ENCOUNTER — Ambulatory Visit: Payer: Medicare HMO | Admitting: Podiatry

## 2016-03-11 ENCOUNTER — Ambulatory Visit: Payer: Medicare HMO | Admitting: Family

## 2016-03-11 ENCOUNTER — Encounter (HOSPITAL_COMMUNITY): Payer: Medicare HMO

## 2016-03-11 ENCOUNTER — Ambulatory Visit (HOSPITAL_COMMUNITY): Payer: Medicare HMO | Attending: Surgery

## 2016-03-13 ENCOUNTER — Encounter: Payer: Self-pay | Admitting: Family

## 2016-03-19 ENCOUNTER — Ambulatory Visit: Payer: Medicare HMO | Admitting: Family

## 2016-04-07 ENCOUNTER — Telehealth: Payer: Self-pay | Admitting: *Deleted

## 2016-04-07 NOTE — Telephone Encounter (Signed)
Called Stephanie Olson for her 1 year follow-up for the SAFE-DCB trial. She states she has been doing well and has had no additional interventions to her right leg. I stressed the importance of her continuing her follow-up with the Vascular office.

## 2016-06-06 DIAGNOSIS — Z23 Encounter for immunization: Secondary | ICD-10-CM | POA: Diagnosis not present

## 2016-08-07 ENCOUNTER — Other Ambulatory Visit: Payer: Self-pay | Admitting: Surgery

## 2016-08-07 DIAGNOSIS — I739 Peripheral vascular disease, unspecified: Secondary | ICD-10-CM

## 2017-01-14 IMAGING — DX DG FOOT COMPLETE 3+V*R*
3 series · 3 of 3 positions shown · non-contrast
Comparison: None.

CLINICAL DATA: 73-year-old female with a history of ulcer on the
plantar surface right heel.

EXAM:
RIGHT FOOT COMPLETE - 3+ VIEW

[foot ap]
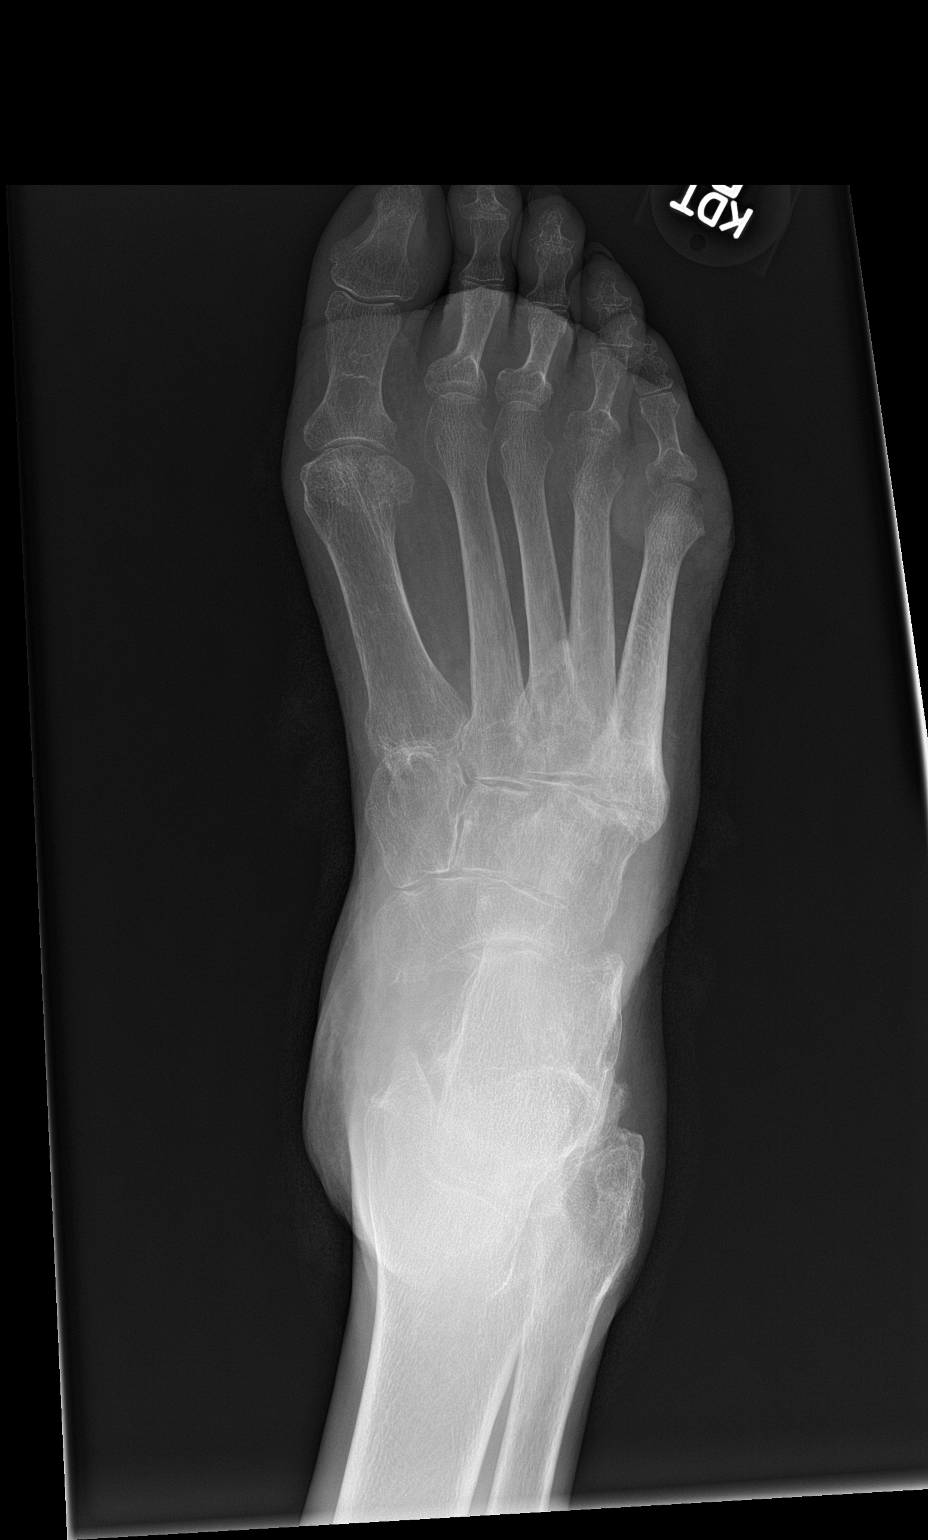

[foot obl]
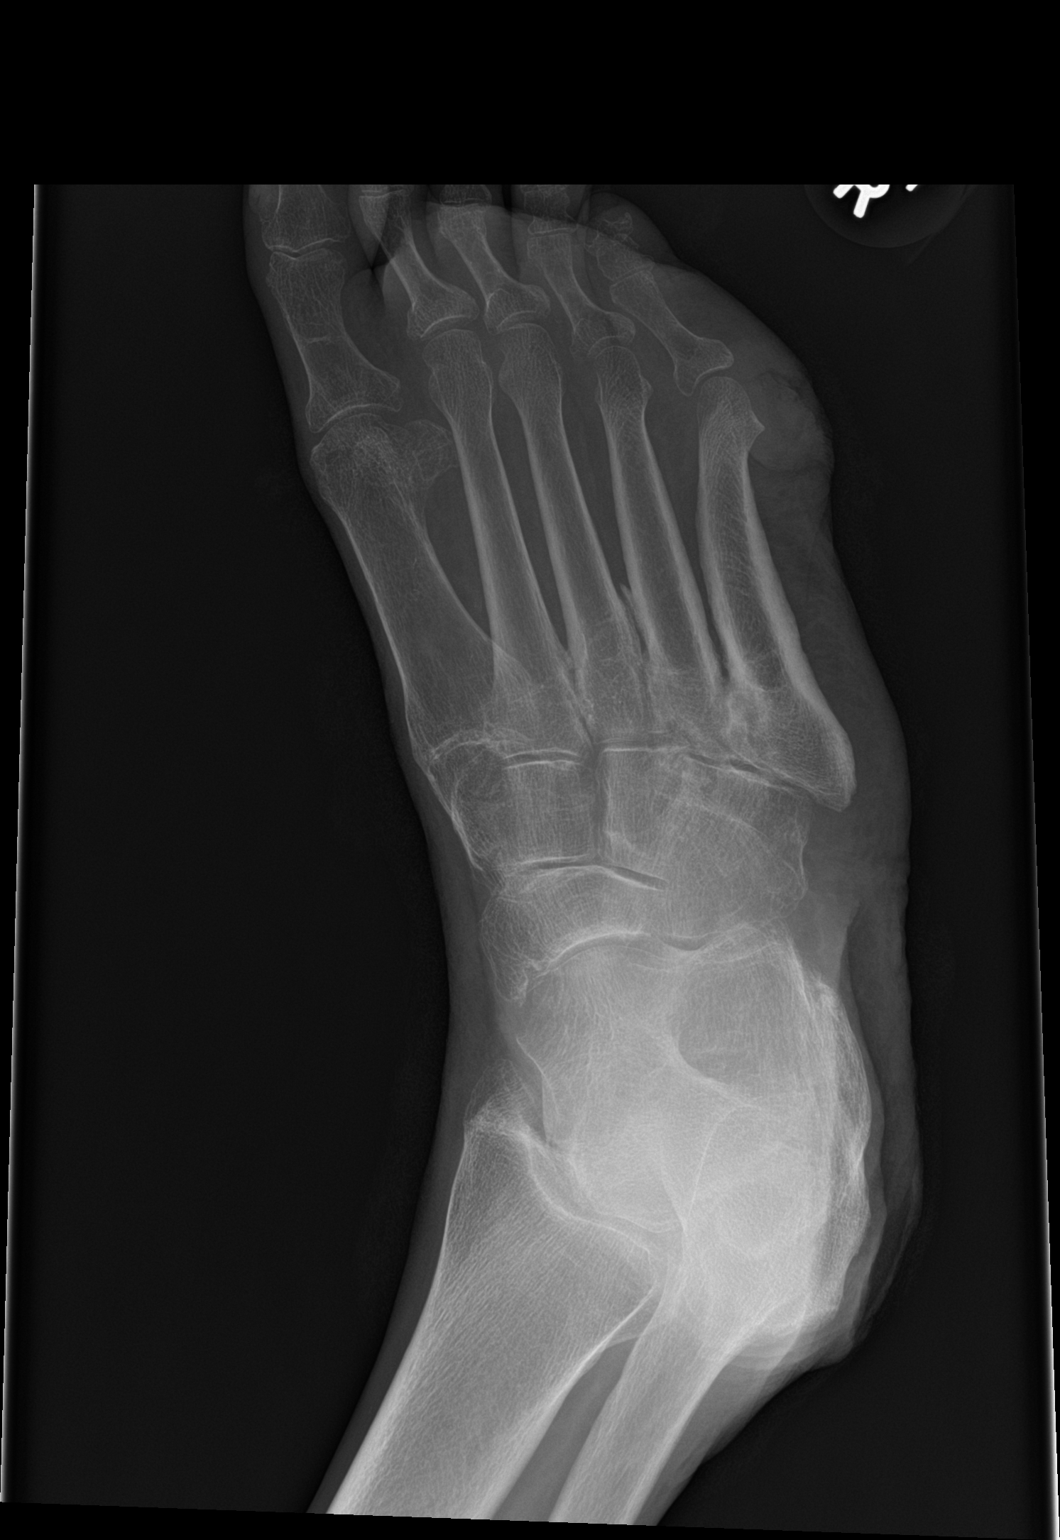

[foot lat]
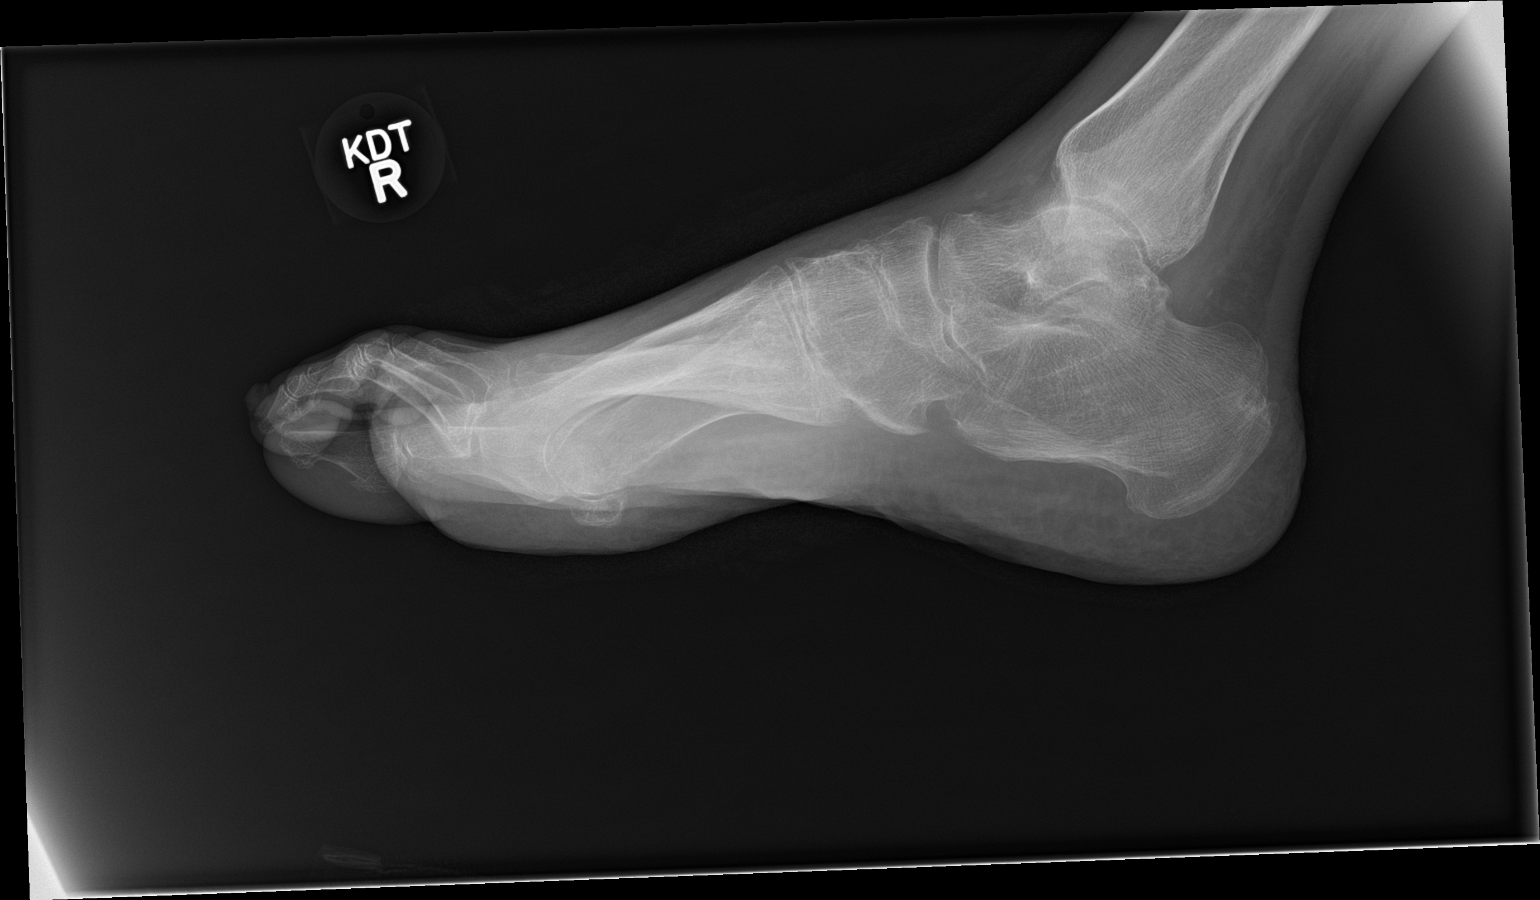

[3 of 3 positions shown; findings below may reference images not displayed]

FINDINGS: No acute fracture line identified.

Soft tissue swelling with subcutaneous gas along the lateral aspect
of the fifth metatarsal. No bony erosion or destructive changes.

Osteopenia.

Degenerative changes of the midfoot, interphalangeal joints, and
hindfoot. Calcifications of the posterior tibial artery.
IMPRESSION: No acute bony abnormality, with no erosive changes of the fifth
metatarsal to suggest osteomyelitis.

Soft tissue swelling and subcutaneous gas overlying the fifth
metatarsal, compatible with history of soft tissue wound/ulcer.

Calcifications of the posterior tibial artery, compatible with
infrapopliteal disease. If the patient has not yet been evaluated
for claudication/CLI, noninvasive testing with ABI, segmental
duplex, and segmental pulse volume recording may be considered as
well as office based evaluation to assess for wound healing
capability.

## 2017-01-22 ENCOUNTER — Telehealth: Payer: Self-pay | Admitting: *Deleted

## 2017-01-22 NOTE — Telephone Encounter (Signed)
Attempted to call [name] for [facility] registry follow-up. Was unable to leave message.

## 2017-01-26 ENCOUNTER — Other Ambulatory Visit: Payer: Self-pay | Admitting: Vascular Surgery

## 2017-01-26 DIAGNOSIS — I739 Peripheral vascular disease, unspecified: Secondary | ICD-10-CM

## 2017-04-12 DIAGNOSIS — N390 Urinary tract infection, site not specified: Secondary | ICD-10-CM | POA: Diagnosis not present

## 2017-04-12 DIAGNOSIS — Z9181 History of falling: Secondary | ICD-10-CM | POA: Diagnosis not present

## 2017-04-12 DIAGNOSIS — L97419 Non-pressure chronic ulcer of right heel and midfoot with unspecified severity: Secondary | ICD-10-CM | POA: Diagnosis not present

## 2017-04-12 DIAGNOSIS — I10 Essential (primary) hypertension: Secondary | ICD-10-CM | POA: Diagnosis not present

## 2017-04-12 DIAGNOSIS — M542 Cervicalgia: Secondary | ICD-10-CM | POA: Diagnosis not present

## 2017-04-12 DIAGNOSIS — M129 Arthropathy, unspecified: Secondary | ICD-10-CM | POA: Diagnosis not present

## 2017-04-12 DIAGNOSIS — R21 Rash and other nonspecific skin eruption: Secondary | ICD-10-CM | POA: Diagnosis not present

## 2017-04-12 DIAGNOSIS — E1122 Type 2 diabetes mellitus with diabetic chronic kidney disease: Secondary | ICD-10-CM | POA: Diagnosis not present

## 2017-04-12 DIAGNOSIS — R55 Syncope and collapse: Secondary | ICD-10-CM | POA: Diagnosis not present

## 2017-04-12 DIAGNOSIS — L97519 Non-pressure chronic ulcer of other part of right foot with unspecified severity: Secondary | ICD-10-CM | POA: Diagnosis not present

## 2017-04-12 DIAGNOSIS — E11621 Type 2 diabetes mellitus with foot ulcer: Secondary | ICD-10-CM | POA: Diagnosis not present

## 2017-04-12 DIAGNOSIS — M6281 Muscle weakness (generalized): Secondary | ICD-10-CM | POA: Diagnosis not present

## 2017-04-12 DIAGNOSIS — E119 Type 2 diabetes mellitus without complications: Secondary | ICD-10-CM | POA: Diagnosis not present

## 2017-04-12 DIAGNOSIS — L97313 Non-pressure chronic ulcer of right ankle with necrosis of muscle: Secondary | ICD-10-CM | POA: Diagnosis not present

## 2017-04-15 DIAGNOSIS — L97419 Non-pressure chronic ulcer of right heel and midfoot with unspecified severity: Secondary | ICD-10-CM | POA: Diagnosis not present

## 2017-04-15 DIAGNOSIS — L97313 Non-pressure chronic ulcer of right ankle with necrosis of muscle: Secondary | ICD-10-CM | POA: Diagnosis not present

## 2017-04-16 DIAGNOSIS — E1122 Type 2 diabetes mellitus with diabetic chronic kidney disease: Secondary | ICD-10-CM | POA: Diagnosis not present

## 2017-04-16 DIAGNOSIS — E11621 Type 2 diabetes mellitus with foot ulcer: Secondary | ICD-10-CM | POA: Diagnosis not present

## 2017-04-16 DIAGNOSIS — M542 Cervicalgia: Secondary | ICD-10-CM | POA: Diagnosis not present

## 2017-04-16 DIAGNOSIS — I10 Essential (primary) hypertension: Secondary | ICD-10-CM | POA: Diagnosis not present

## 2017-04-23 DIAGNOSIS — L97419 Non-pressure chronic ulcer of right heel and midfoot with unspecified severity: Secondary | ICD-10-CM | POA: Diagnosis not present

## 2017-04-28 IMAGING — DX DG FOOT COMPLETE 3+V*R*
3 series · 3 of 3 positions shown · non-contrast
Comparison: Radiograph 12/23/2014

CLINICAL DATA: None. Ulcer of the RIGHT foot. Plantar surface near
the arch.

EXAM:
RIGHT FOOT COMPLETE - 3+ VIEW

[x foot ap right]
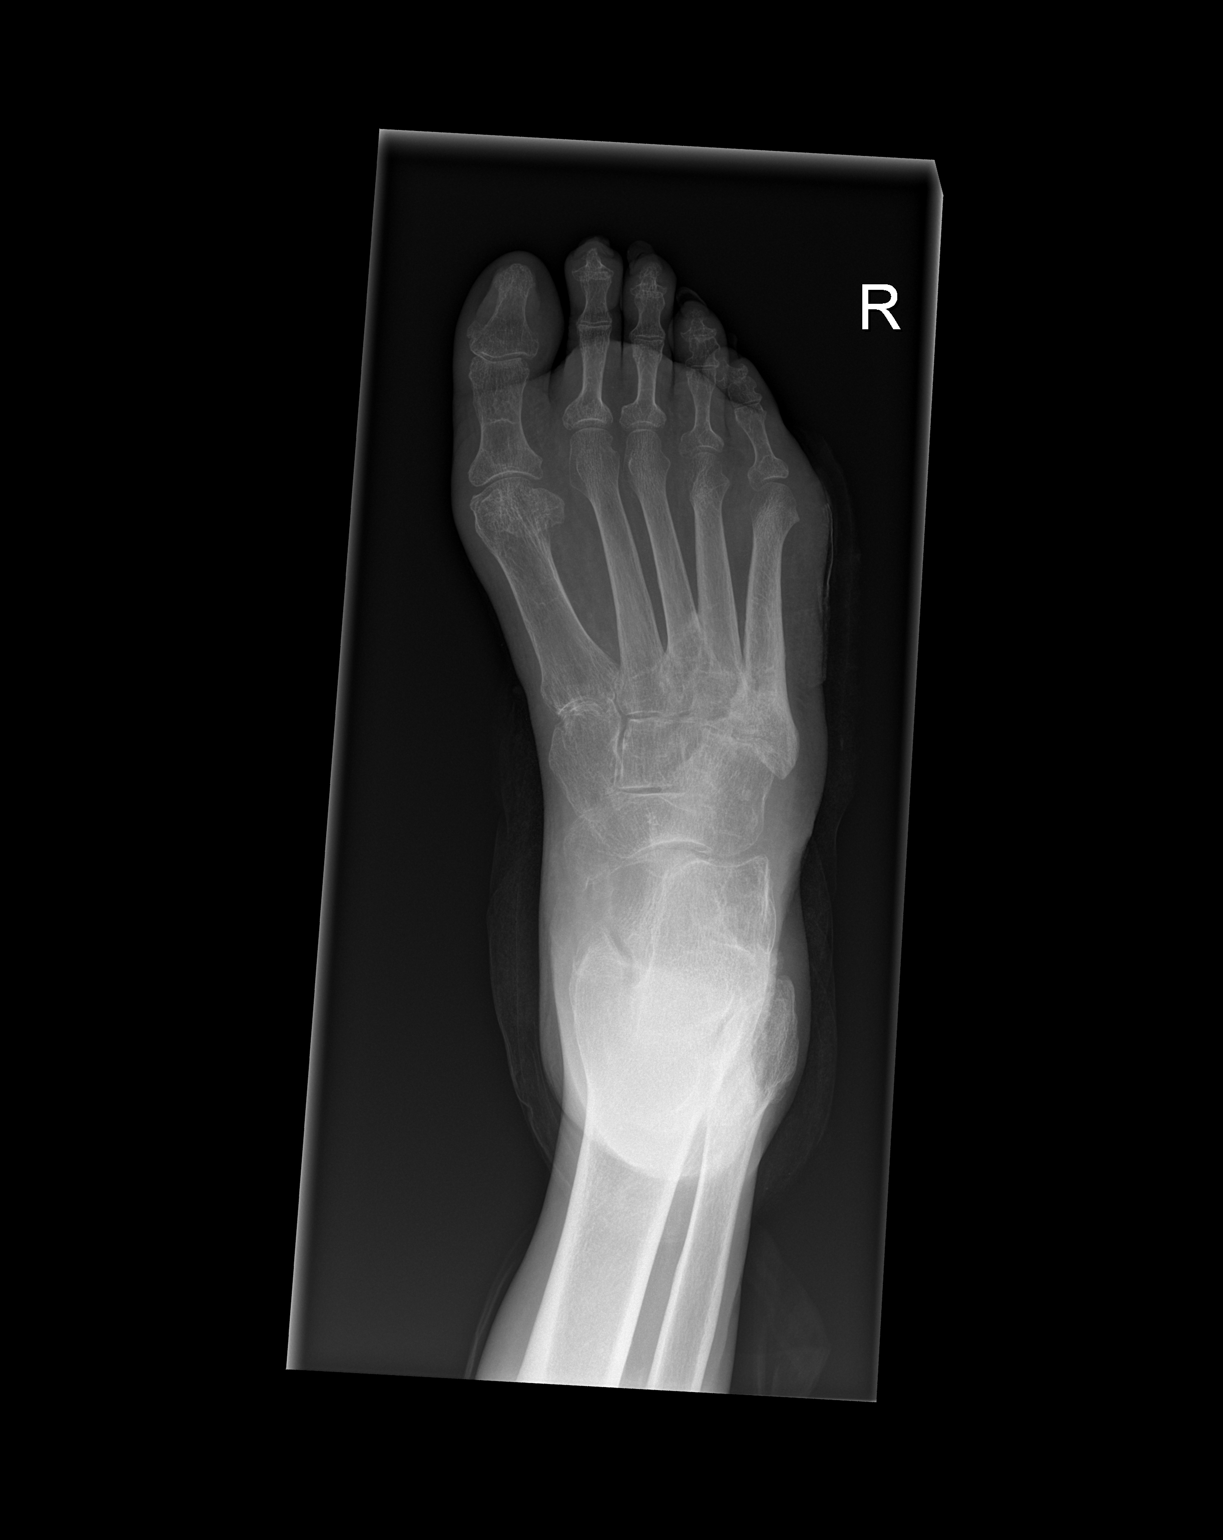

[x foot obl right]
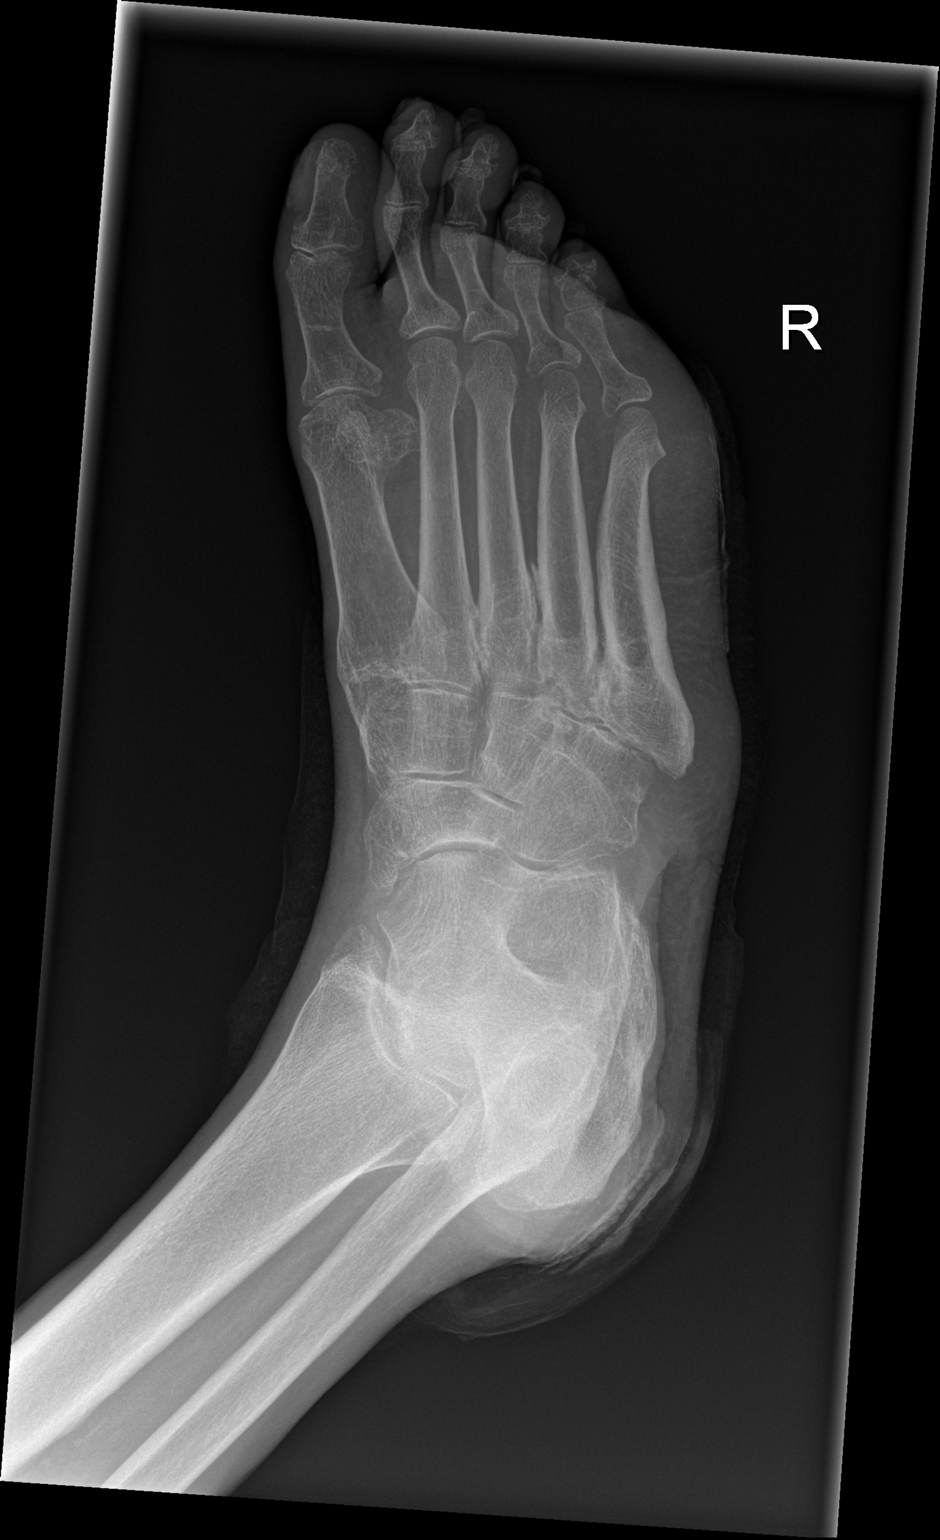

[x foot lat right]
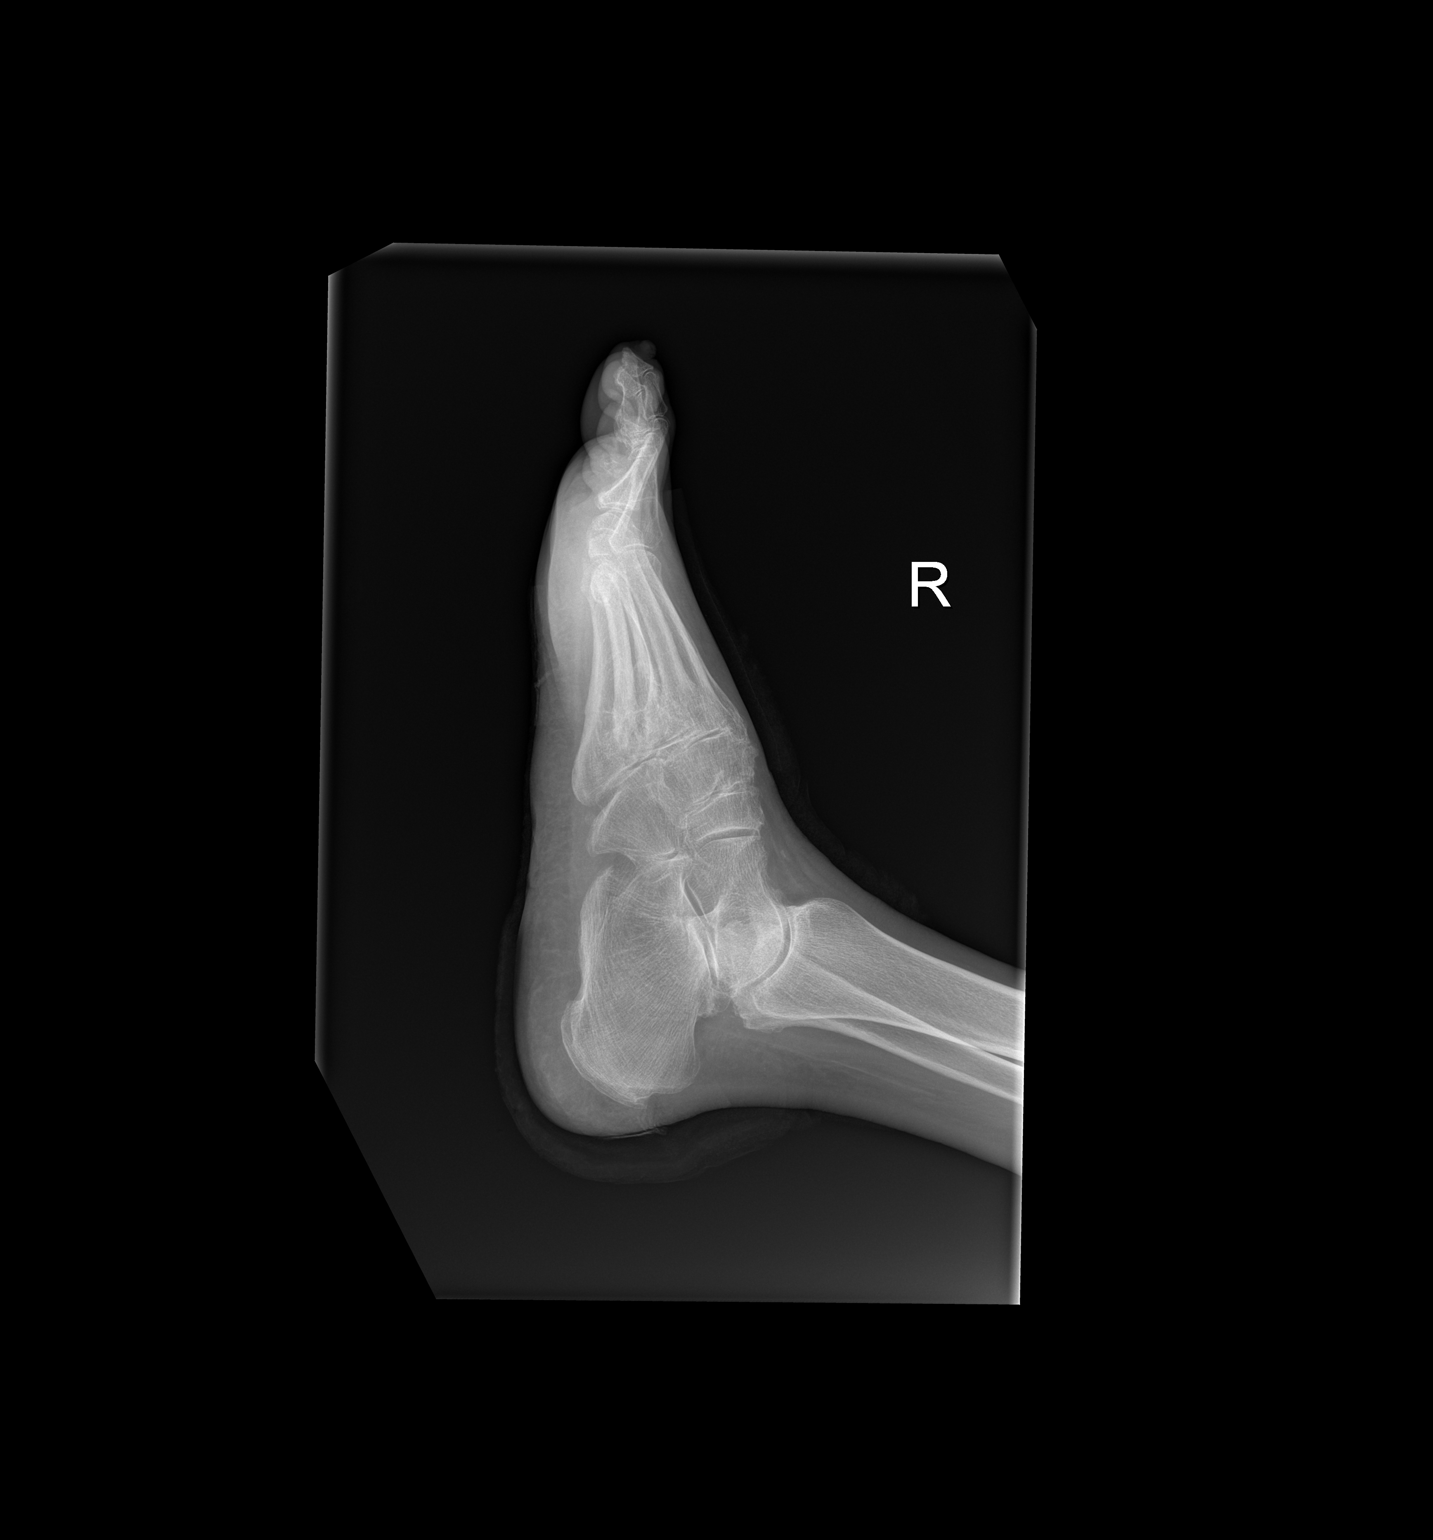

[3 of 3 positions shown; findings below may reference images not displayed]

FINDINGS: No cortical erosion of the metatarsals or phalanges. Bone
demineralization noted. Pes planus deformity.
IMPRESSION: 1. No evidence of osteomyelitis.
2. Osteopenia.

## 2017-04-29 DIAGNOSIS — L97419 Non-pressure chronic ulcer of right heel and midfoot with unspecified severity: Secondary | ICD-10-CM | POA: Diagnosis not present

## 2017-04-30 DIAGNOSIS — R21 Rash and other nonspecific skin eruption: Secondary | ICD-10-CM | POA: Diagnosis not present

## 2017-04-30 DIAGNOSIS — E119 Type 2 diabetes mellitus without complications: Secondary | ICD-10-CM | POA: Diagnosis not present

## 2017-04-30 DIAGNOSIS — M6281 Muscle weakness (generalized): Secondary | ICD-10-CM | POA: Diagnosis not present

## 2017-05-04 DIAGNOSIS — L97519 Non-pressure chronic ulcer of other part of right foot with unspecified severity: Secondary | ICD-10-CM | POA: Diagnosis not present

## 2017-05-04 DIAGNOSIS — Z9181 History of falling: Secondary | ICD-10-CM | POA: Diagnosis not present

## 2017-05-04 DIAGNOSIS — I10 Essential (primary) hypertension: Secondary | ICD-10-CM | POA: Diagnosis not present

## 2017-05-04 DIAGNOSIS — E119 Type 2 diabetes mellitus without complications: Secondary | ICD-10-CM | POA: Diagnosis not present

## 2017-05-05 DIAGNOSIS — E11621 Type 2 diabetes mellitus with foot ulcer: Secondary | ICD-10-CM | POA: Diagnosis not present

## 2017-05-05 DIAGNOSIS — I10 Essential (primary) hypertension: Secondary | ICD-10-CM | POA: Diagnosis not present

## 2017-05-05 DIAGNOSIS — L97412 Non-pressure chronic ulcer of right heel and midfoot with fat layer exposed: Secondary | ICD-10-CM | POA: Diagnosis not present

## 2017-05-05 DIAGNOSIS — E1140 Type 2 diabetes mellitus with diabetic neuropathy, unspecified: Secondary | ICD-10-CM | POA: Diagnosis not present

## 2017-05-05 DIAGNOSIS — L97909 Non-pressure chronic ulcer of unspecified part of unspecified lower leg with unspecified severity: Secondary | ICD-10-CM | POA: Diagnosis not present

## 2017-05-05 DIAGNOSIS — R531 Weakness: Secondary | ICD-10-CM | POA: Diagnosis not present

## 2017-05-05 DIAGNOSIS — L97302 Non-pressure chronic ulcer of unspecified ankle with fat layer exposed: Secondary | ICD-10-CM | POA: Diagnosis not present

## 2017-05-05 DIAGNOSIS — M6281 Muscle weakness (generalized): Secondary | ICD-10-CM | POA: Diagnosis not present

## 2017-05-07 DIAGNOSIS — I10 Essential (primary) hypertension: Secondary | ICD-10-CM | POA: Diagnosis not present

## 2017-05-07 DIAGNOSIS — E1140 Type 2 diabetes mellitus with diabetic neuropathy, unspecified: Secondary | ICD-10-CM | POA: Diagnosis not present

## 2017-05-07 DIAGNOSIS — M6281 Muscle weakness (generalized): Secondary | ICD-10-CM | POA: Diagnosis not present

## 2017-05-07 DIAGNOSIS — L97909 Non-pressure chronic ulcer of unspecified part of unspecified lower leg with unspecified severity: Secondary | ICD-10-CM | POA: Diagnosis not present

## 2017-05-09 DIAGNOSIS — Z4800 Encounter for change or removal of nonsurgical wound dressing: Secondary | ICD-10-CM | POA: Diagnosis not present

## 2017-05-09 DIAGNOSIS — M1990 Unspecified osteoarthritis, unspecified site: Secondary | ICD-10-CM | POA: Diagnosis not present

## 2017-05-09 DIAGNOSIS — L97302 Non-pressure chronic ulcer of unspecified ankle with fat layer exposed: Secondary | ICD-10-CM | POA: Diagnosis not present

## 2017-05-09 DIAGNOSIS — E11621 Type 2 diabetes mellitus with foot ulcer: Secondary | ICD-10-CM | POA: Diagnosis not present

## 2017-05-09 DIAGNOSIS — E1122 Type 2 diabetes mellitus with diabetic chronic kidney disease: Secondary | ICD-10-CM | POA: Diagnosis not present

## 2017-05-09 DIAGNOSIS — Z87440 Personal history of urinary (tract) infections: Secondary | ICD-10-CM | POA: Diagnosis not present

## 2017-05-09 DIAGNOSIS — Z9181 History of falling: Secondary | ICD-10-CM | POA: Diagnosis not present

## 2017-05-09 DIAGNOSIS — R531 Weakness: Secondary | ICD-10-CM | POA: Diagnosis not present

## 2017-05-09 DIAGNOSIS — L97412 Non-pressure chronic ulcer of right heel and midfoot with fat layer exposed: Secondary | ICD-10-CM | POA: Diagnosis not present

## 2017-05-09 DIAGNOSIS — Z79891 Long term (current) use of opiate analgesic: Secondary | ICD-10-CM | POA: Diagnosis not present

## 2017-05-09 DIAGNOSIS — I129 Hypertensive chronic kidney disease with stage 1 through stage 4 chronic kidney disease, or unspecified chronic kidney disease: Secondary | ICD-10-CM | POA: Diagnosis not present

## 2017-05-09 DIAGNOSIS — N183 Chronic kidney disease, stage 3 (moderate): Secondary | ICD-10-CM | POA: Diagnosis not present

## 2017-05-11 DIAGNOSIS — Z87440 Personal history of urinary (tract) infections: Secondary | ICD-10-CM | POA: Diagnosis not present

## 2017-05-11 DIAGNOSIS — I129 Hypertensive chronic kidney disease with stage 1 through stage 4 chronic kidney disease, or unspecified chronic kidney disease: Secondary | ICD-10-CM | POA: Diagnosis not present

## 2017-05-11 DIAGNOSIS — L97412 Non-pressure chronic ulcer of right heel and midfoot with fat layer exposed: Secondary | ICD-10-CM | POA: Diagnosis not present

## 2017-05-11 DIAGNOSIS — E1122 Type 2 diabetes mellitus with diabetic chronic kidney disease: Secondary | ICD-10-CM | POA: Diagnosis not present

## 2017-05-11 DIAGNOSIS — R531 Weakness: Secondary | ICD-10-CM | POA: Diagnosis not present

## 2017-05-11 DIAGNOSIS — M1990 Unspecified osteoarthritis, unspecified site: Secondary | ICD-10-CM | POA: Diagnosis not present

## 2017-05-11 DIAGNOSIS — Z79891 Long term (current) use of opiate analgesic: Secondary | ICD-10-CM | POA: Diagnosis not present

## 2017-05-11 DIAGNOSIS — Z9181 History of falling: Secondary | ICD-10-CM | POA: Diagnosis not present

## 2017-05-11 DIAGNOSIS — Z4800 Encounter for change or removal of nonsurgical wound dressing: Secondary | ICD-10-CM | POA: Diagnosis not present

## 2017-05-11 DIAGNOSIS — E11621 Type 2 diabetes mellitus with foot ulcer: Secondary | ICD-10-CM | POA: Diagnosis not present

## 2017-05-11 DIAGNOSIS — L97302 Non-pressure chronic ulcer of unspecified ankle with fat layer exposed: Secondary | ICD-10-CM | POA: Diagnosis not present

## 2017-05-11 DIAGNOSIS — N183 Chronic kidney disease, stage 3 (moderate): Secondary | ICD-10-CM | POA: Diagnosis not present

## 2017-05-12 DIAGNOSIS — Z9181 History of falling: Secondary | ICD-10-CM | POA: Diagnosis not present

## 2017-05-12 DIAGNOSIS — M1990 Unspecified osteoarthritis, unspecified site: Secondary | ICD-10-CM | POA: Diagnosis not present

## 2017-05-12 DIAGNOSIS — E1122 Type 2 diabetes mellitus with diabetic chronic kidney disease: Secondary | ICD-10-CM | POA: Diagnosis not present

## 2017-05-12 DIAGNOSIS — Z87440 Personal history of urinary (tract) infections: Secondary | ICD-10-CM | POA: Diagnosis not present

## 2017-05-12 DIAGNOSIS — E11621 Type 2 diabetes mellitus with foot ulcer: Secondary | ICD-10-CM | POA: Diagnosis not present

## 2017-05-12 DIAGNOSIS — L97412 Non-pressure chronic ulcer of right heel and midfoot with fat layer exposed: Secondary | ICD-10-CM | POA: Diagnosis not present

## 2017-05-12 DIAGNOSIS — Z4800 Encounter for change or removal of nonsurgical wound dressing: Secondary | ICD-10-CM | POA: Diagnosis not present

## 2017-05-12 DIAGNOSIS — Z79891 Long term (current) use of opiate analgesic: Secondary | ICD-10-CM | POA: Diagnosis not present

## 2017-05-12 DIAGNOSIS — N183 Chronic kidney disease, stage 3 (moderate): Secondary | ICD-10-CM | POA: Diagnosis not present

## 2017-05-12 DIAGNOSIS — I129 Hypertensive chronic kidney disease with stage 1 through stage 4 chronic kidney disease, or unspecified chronic kidney disease: Secondary | ICD-10-CM | POA: Diagnosis not present

## 2017-05-12 DIAGNOSIS — R531 Weakness: Secondary | ICD-10-CM | POA: Diagnosis not present

## 2017-05-12 DIAGNOSIS — L97302 Non-pressure chronic ulcer of unspecified ankle with fat layer exposed: Secondary | ICD-10-CM | POA: Diagnosis not present

## 2017-05-13 DIAGNOSIS — Z4800 Encounter for change or removal of nonsurgical wound dressing: Secondary | ICD-10-CM | POA: Diagnosis not present

## 2017-05-13 DIAGNOSIS — Z87440 Personal history of urinary (tract) infections: Secondary | ICD-10-CM | POA: Diagnosis not present

## 2017-05-13 DIAGNOSIS — R531 Weakness: Secondary | ICD-10-CM | POA: Diagnosis not present

## 2017-05-13 DIAGNOSIS — L97412 Non-pressure chronic ulcer of right heel and midfoot with fat layer exposed: Secondary | ICD-10-CM | POA: Diagnosis not present

## 2017-05-13 DIAGNOSIS — L97302 Non-pressure chronic ulcer of unspecified ankle with fat layer exposed: Secondary | ICD-10-CM | POA: Diagnosis not present

## 2017-05-13 DIAGNOSIS — E11621 Type 2 diabetes mellitus with foot ulcer: Secondary | ICD-10-CM | POA: Diagnosis not present

## 2017-05-13 DIAGNOSIS — N183 Chronic kidney disease, stage 3 (moderate): Secondary | ICD-10-CM | POA: Diagnosis not present

## 2017-05-13 DIAGNOSIS — E1122 Type 2 diabetes mellitus with diabetic chronic kidney disease: Secondary | ICD-10-CM | POA: Diagnosis not present

## 2017-05-13 DIAGNOSIS — Z79891 Long term (current) use of opiate analgesic: Secondary | ICD-10-CM | POA: Diagnosis not present

## 2017-05-13 DIAGNOSIS — I129 Hypertensive chronic kidney disease with stage 1 through stage 4 chronic kidney disease, or unspecified chronic kidney disease: Secondary | ICD-10-CM | POA: Diagnosis not present

## 2017-05-13 DIAGNOSIS — M1990 Unspecified osteoarthritis, unspecified site: Secondary | ICD-10-CM | POA: Diagnosis not present

## 2017-05-13 DIAGNOSIS — Z9181 History of falling: Secondary | ICD-10-CM | POA: Diagnosis not present

## 2017-05-14 DIAGNOSIS — Z79891 Long term (current) use of opiate analgesic: Secondary | ICD-10-CM | POA: Diagnosis not present

## 2017-05-14 DIAGNOSIS — R531 Weakness: Secondary | ICD-10-CM | POA: Diagnosis not present

## 2017-05-14 DIAGNOSIS — L97412 Non-pressure chronic ulcer of right heel and midfoot with fat layer exposed: Secondary | ICD-10-CM | POA: Diagnosis not present

## 2017-05-14 DIAGNOSIS — K5900 Constipation, unspecified: Secondary | ICD-10-CM | POA: Diagnosis not present

## 2017-05-14 DIAGNOSIS — M1990 Unspecified osteoarthritis, unspecified site: Secondary | ICD-10-CM | POA: Diagnosis not present

## 2017-05-14 DIAGNOSIS — L97302 Non-pressure chronic ulcer of unspecified ankle with fat layer exposed: Secondary | ICD-10-CM | POA: Diagnosis not present

## 2017-05-14 DIAGNOSIS — N183 Chronic kidney disease, stage 3 (moderate): Secondary | ICD-10-CM | POA: Diagnosis not present

## 2017-05-14 DIAGNOSIS — L89892 Pressure ulcer of other site, stage 2: Secondary | ICD-10-CM | POA: Diagnosis not present

## 2017-05-14 DIAGNOSIS — E1122 Type 2 diabetes mellitus with diabetic chronic kidney disease: Secondary | ICD-10-CM | POA: Diagnosis not present

## 2017-05-14 DIAGNOSIS — I10 Essential (primary) hypertension: Secondary | ICD-10-CM | POA: Diagnosis not present

## 2017-05-14 DIAGNOSIS — E11621 Type 2 diabetes mellitus with foot ulcer: Secondary | ICD-10-CM | POA: Diagnosis not present

## 2017-05-14 DIAGNOSIS — Z87440 Personal history of urinary (tract) infections: Secondary | ICD-10-CM | POA: Diagnosis not present

## 2017-05-14 DIAGNOSIS — Z4800 Encounter for change or removal of nonsurgical wound dressing: Secondary | ICD-10-CM | POA: Diagnosis not present

## 2017-05-14 DIAGNOSIS — I129 Hypertensive chronic kidney disease with stage 1 through stage 4 chronic kidney disease, or unspecified chronic kidney disease: Secondary | ICD-10-CM | POA: Diagnosis not present

## 2017-05-14 DIAGNOSIS — E119 Type 2 diabetes mellitus without complications: Secondary | ICD-10-CM | POA: Diagnosis not present

## 2017-05-14 DIAGNOSIS — Z9181 History of falling: Secondary | ICD-10-CM | POA: Diagnosis not present

## 2017-05-14 DIAGNOSIS — R112 Nausea with vomiting, unspecified: Secondary | ICD-10-CM | POA: Diagnosis not present

## 2017-05-15 DIAGNOSIS — N183 Chronic kidney disease, stage 3 (moderate): Secondary | ICD-10-CM | POA: Diagnosis not present

## 2017-05-15 DIAGNOSIS — Z79891 Long term (current) use of opiate analgesic: Secondary | ICD-10-CM | POA: Diagnosis not present

## 2017-05-15 DIAGNOSIS — L97412 Non-pressure chronic ulcer of right heel and midfoot with fat layer exposed: Secondary | ICD-10-CM | POA: Diagnosis not present

## 2017-05-15 DIAGNOSIS — L97302 Non-pressure chronic ulcer of unspecified ankle with fat layer exposed: Secondary | ICD-10-CM | POA: Diagnosis not present

## 2017-05-15 DIAGNOSIS — Z4800 Encounter for change or removal of nonsurgical wound dressing: Secondary | ICD-10-CM | POA: Diagnosis not present

## 2017-05-15 DIAGNOSIS — E1122 Type 2 diabetes mellitus with diabetic chronic kidney disease: Secondary | ICD-10-CM | POA: Diagnosis not present

## 2017-05-15 DIAGNOSIS — M1990 Unspecified osteoarthritis, unspecified site: Secondary | ICD-10-CM | POA: Diagnosis not present

## 2017-05-15 DIAGNOSIS — R531 Weakness: Secondary | ICD-10-CM | POA: Diagnosis not present

## 2017-05-15 DIAGNOSIS — Z87440 Personal history of urinary (tract) infections: Secondary | ICD-10-CM | POA: Diagnosis not present

## 2017-05-15 DIAGNOSIS — Z9181 History of falling: Secondary | ICD-10-CM | POA: Diagnosis not present

## 2017-05-15 DIAGNOSIS — I129 Hypertensive chronic kidney disease with stage 1 through stage 4 chronic kidney disease, or unspecified chronic kidney disease: Secondary | ICD-10-CM | POA: Diagnosis not present

## 2017-05-15 DIAGNOSIS — E11621 Type 2 diabetes mellitus with foot ulcer: Secondary | ICD-10-CM | POA: Diagnosis not present

## 2017-05-17 DIAGNOSIS — E1122 Type 2 diabetes mellitus with diabetic chronic kidney disease: Secondary | ICD-10-CM | POA: Diagnosis not present

## 2017-05-17 DIAGNOSIS — E11621 Type 2 diabetes mellitus with foot ulcer: Secondary | ICD-10-CM | POA: Diagnosis not present

## 2017-05-17 DIAGNOSIS — I129 Hypertensive chronic kidney disease with stage 1 through stage 4 chronic kidney disease, or unspecified chronic kidney disease: Secondary | ICD-10-CM | POA: Diagnosis not present

## 2017-05-17 DIAGNOSIS — L97302 Non-pressure chronic ulcer of unspecified ankle with fat layer exposed: Secondary | ICD-10-CM | POA: Diagnosis not present

## 2017-05-17 DIAGNOSIS — L97412 Non-pressure chronic ulcer of right heel and midfoot with fat layer exposed: Secondary | ICD-10-CM | POA: Diagnosis not present

## 2017-05-17 DIAGNOSIS — M1990 Unspecified osteoarthritis, unspecified site: Secondary | ICD-10-CM | POA: Diagnosis not present

## 2017-05-17 DIAGNOSIS — Z9181 History of falling: Secondary | ICD-10-CM | POA: Diagnosis not present

## 2017-05-17 DIAGNOSIS — N183 Chronic kidney disease, stage 3 (moderate): Secondary | ICD-10-CM | POA: Diagnosis not present

## 2017-05-17 DIAGNOSIS — R531 Weakness: Secondary | ICD-10-CM | POA: Diagnosis not present

## 2017-05-17 DIAGNOSIS — Z4800 Encounter for change or removal of nonsurgical wound dressing: Secondary | ICD-10-CM | POA: Diagnosis not present

## 2017-05-17 DIAGNOSIS — Z79891 Long term (current) use of opiate analgesic: Secondary | ICD-10-CM | POA: Diagnosis not present

## 2017-05-17 DIAGNOSIS — Z87440 Personal history of urinary (tract) infections: Secondary | ICD-10-CM | POA: Diagnosis not present

## 2017-05-18 DIAGNOSIS — E1122 Type 2 diabetes mellitus with diabetic chronic kidney disease: Secondary | ICD-10-CM | POA: Diagnosis not present

## 2017-05-18 DIAGNOSIS — M1990 Unspecified osteoarthritis, unspecified site: Secondary | ICD-10-CM | POA: Diagnosis not present

## 2017-05-18 DIAGNOSIS — Z9181 History of falling: Secondary | ICD-10-CM | POA: Diagnosis not present

## 2017-05-18 DIAGNOSIS — Z79891 Long term (current) use of opiate analgesic: Secondary | ICD-10-CM | POA: Diagnosis not present

## 2017-05-18 DIAGNOSIS — R531 Weakness: Secondary | ICD-10-CM | POA: Diagnosis not present

## 2017-05-18 DIAGNOSIS — I129 Hypertensive chronic kidney disease with stage 1 through stage 4 chronic kidney disease, or unspecified chronic kidney disease: Secondary | ICD-10-CM | POA: Diagnosis not present

## 2017-05-18 DIAGNOSIS — E11621 Type 2 diabetes mellitus with foot ulcer: Secondary | ICD-10-CM | POA: Diagnosis not present

## 2017-05-18 DIAGNOSIS — Z4800 Encounter for change or removal of nonsurgical wound dressing: Secondary | ICD-10-CM | POA: Diagnosis not present

## 2017-05-18 DIAGNOSIS — N183 Chronic kidney disease, stage 3 (moderate): Secondary | ICD-10-CM | POA: Diagnosis not present

## 2017-05-18 DIAGNOSIS — Z87440 Personal history of urinary (tract) infections: Secondary | ICD-10-CM | POA: Diagnosis not present

## 2017-05-18 DIAGNOSIS — L97412 Non-pressure chronic ulcer of right heel and midfoot with fat layer exposed: Secondary | ICD-10-CM | POA: Diagnosis not present

## 2017-05-18 DIAGNOSIS — L97302 Non-pressure chronic ulcer of unspecified ankle with fat layer exposed: Secondary | ICD-10-CM | POA: Diagnosis not present

## 2017-05-19 DIAGNOSIS — N183 Chronic kidney disease, stage 3 (moderate): Secondary | ICD-10-CM | POA: Diagnosis not present

## 2017-05-19 DIAGNOSIS — E559 Vitamin D deficiency, unspecified: Secondary | ICD-10-CM | POA: Diagnosis not present

## 2017-05-19 DIAGNOSIS — Z79891 Long term (current) use of opiate analgesic: Secondary | ICD-10-CM | POA: Diagnosis not present

## 2017-05-19 DIAGNOSIS — I129 Hypertensive chronic kidney disease with stage 1 through stage 4 chronic kidney disease, or unspecified chronic kidney disease: Secondary | ICD-10-CM | POA: Diagnosis not present

## 2017-05-19 DIAGNOSIS — L97302 Non-pressure chronic ulcer of unspecified ankle with fat layer exposed: Secondary | ICD-10-CM | POA: Diagnosis not present

## 2017-05-19 DIAGNOSIS — M1990 Unspecified osteoarthritis, unspecified site: Secondary | ICD-10-CM | POA: Diagnosis not present

## 2017-05-19 DIAGNOSIS — Z87440 Personal history of urinary (tract) infections: Secondary | ICD-10-CM | POA: Diagnosis not present

## 2017-05-19 DIAGNOSIS — L97412 Non-pressure chronic ulcer of right heel and midfoot with fat layer exposed: Secondary | ICD-10-CM | POA: Diagnosis not present

## 2017-05-19 DIAGNOSIS — E119 Type 2 diabetes mellitus without complications: Secondary | ICD-10-CM | POA: Diagnosis not present

## 2017-05-19 DIAGNOSIS — I10 Essential (primary) hypertension: Secondary | ICD-10-CM | POA: Diagnosis not present

## 2017-05-19 DIAGNOSIS — Z9181 History of falling: Secondary | ICD-10-CM | POA: Diagnosis not present

## 2017-05-19 DIAGNOSIS — R531 Weakness: Secondary | ICD-10-CM | POA: Diagnosis not present

## 2017-05-19 DIAGNOSIS — Z4800 Encounter for change or removal of nonsurgical wound dressing: Secondary | ICD-10-CM | POA: Diagnosis not present

## 2017-05-19 DIAGNOSIS — E11621 Type 2 diabetes mellitus with foot ulcer: Secondary | ICD-10-CM | POA: Diagnosis not present

## 2017-05-19 DIAGNOSIS — E1122 Type 2 diabetes mellitus with diabetic chronic kidney disease: Secondary | ICD-10-CM | POA: Diagnosis not present

## 2017-05-20 DIAGNOSIS — R531 Weakness: Secondary | ICD-10-CM | POA: Diagnosis not present

## 2017-05-20 DIAGNOSIS — L97302 Non-pressure chronic ulcer of unspecified ankle with fat layer exposed: Secondary | ICD-10-CM | POA: Diagnosis not present

## 2017-05-20 DIAGNOSIS — M1990 Unspecified osteoarthritis, unspecified site: Secondary | ICD-10-CM | POA: Diagnosis not present

## 2017-05-20 DIAGNOSIS — Z9181 History of falling: Secondary | ICD-10-CM | POA: Diagnosis not present

## 2017-05-20 DIAGNOSIS — Z87440 Personal history of urinary (tract) infections: Secondary | ICD-10-CM | POA: Diagnosis not present

## 2017-05-20 DIAGNOSIS — I129 Hypertensive chronic kidney disease with stage 1 through stage 4 chronic kidney disease, or unspecified chronic kidney disease: Secondary | ICD-10-CM | POA: Diagnosis not present

## 2017-05-20 DIAGNOSIS — Z79891 Long term (current) use of opiate analgesic: Secondary | ICD-10-CM | POA: Diagnosis not present

## 2017-05-20 DIAGNOSIS — E11621 Type 2 diabetes mellitus with foot ulcer: Secondary | ICD-10-CM | POA: Diagnosis not present

## 2017-05-20 DIAGNOSIS — Z4800 Encounter for change or removal of nonsurgical wound dressing: Secondary | ICD-10-CM | POA: Diagnosis not present

## 2017-05-20 DIAGNOSIS — N183 Chronic kidney disease, stage 3 (moderate): Secondary | ICD-10-CM | POA: Diagnosis not present

## 2017-05-20 DIAGNOSIS — E1122 Type 2 diabetes mellitus with diabetic chronic kidney disease: Secondary | ICD-10-CM | POA: Diagnosis not present

## 2017-05-20 DIAGNOSIS — L97412 Non-pressure chronic ulcer of right heel and midfoot with fat layer exposed: Secondary | ICD-10-CM | POA: Diagnosis not present

## 2017-05-22 DIAGNOSIS — L97302 Non-pressure chronic ulcer of unspecified ankle with fat layer exposed: Secondary | ICD-10-CM | POA: Diagnosis not present

## 2017-05-22 DIAGNOSIS — R531 Weakness: Secondary | ICD-10-CM | POA: Diagnosis not present

## 2017-05-22 DIAGNOSIS — E1122 Type 2 diabetes mellitus with diabetic chronic kidney disease: Secondary | ICD-10-CM | POA: Diagnosis not present

## 2017-05-22 DIAGNOSIS — E11621 Type 2 diabetes mellitus with foot ulcer: Secondary | ICD-10-CM | POA: Diagnosis not present

## 2017-05-22 DIAGNOSIS — L97412 Non-pressure chronic ulcer of right heel and midfoot with fat layer exposed: Secondary | ICD-10-CM | POA: Diagnosis not present

## 2017-05-22 DIAGNOSIS — M1990 Unspecified osteoarthritis, unspecified site: Secondary | ICD-10-CM | POA: Diagnosis not present

## 2017-05-22 DIAGNOSIS — N183 Chronic kidney disease, stage 3 (moderate): Secondary | ICD-10-CM | POA: Diagnosis not present

## 2017-05-22 DIAGNOSIS — Z9181 History of falling: Secondary | ICD-10-CM | POA: Diagnosis not present

## 2017-05-22 DIAGNOSIS — I129 Hypertensive chronic kidney disease with stage 1 through stage 4 chronic kidney disease, or unspecified chronic kidney disease: Secondary | ICD-10-CM | POA: Diagnosis not present

## 2017-05-22 DIAGNOSIS — Z79891 Long term (current) use of opiate analgesic: Secondary | ICD-10-CM | POA: Diagnosis not present

## 2017-05-22 DIAGNOSIS — Z87440 Personal history of urinary (tract) infections: Secondary | ICD-10-CM | POA: Diagnosis not present

## 2017-05-22 DIAGNOSIS — Z4800 Encounter for change or removal of nonsurgical wound dressing: Secondary | ICD-10-CM | POA: Diagnosis not present

## 2017-05-24 DIAGNOSIS — Z87440 Personal history of urinary (tract) infections: Secondary | ICD-10-CM | POA: Diagnosis not present

## 2017-05-24 DIAGNOSIS — M1990 Unspecified osteoarthritis, unspecified site: Secondary | ICD-10-CM | POA: Diagnosis not present

## 2017-05-24 DIAGNOSIS — E11621 Type 2 diabetes mellitus with foot ulcer: Secondary | ICD-10-CM | POA: Diagnosis not present

## 2017-05-24 DIAGNOSIS — Z79891 Long term (current) use of opiate analgesic: Secondary | ICD-10-CM | POA: Diagnosis not present

## 2017-05-24 DIAGNOSIS — R531 Weakness: Secondary | ICD-10-CM | POA: Diagnosis not present

## 2017-05-24 DIAGNOSIS — N183 Chronic kidney disease, stage 3 (moderate): Secondary | ICD-10-CM | POA: Diagnosis not present

## 2017-05-24 DIAGNOSIS — E1122 Type 2 diabetes mellitus with diabetic chronic kidney disease: Secondary | ICD-10-CM | POA: Diagnosis not present

## 2017-05-24 DIAGNOSIS — E1159 Type 2 diabetes mellitus with other circulatory complications: Secondary | ICD-10-CM | POA: Diagnosis not present

## 2017-05-24 DIAGNOSIS — L97302 Non-pressure chronic ulcer of unspecified ankle with fat layer exposed: Secondary | ICD-10-CM | POA: Diagnosis not present

## 2017-05-24 DIAGNOSIS — L97412 Non-pressure chronic ulcer of right heel and midfoot with fat layer exposed: Secondary | ICD-10-CM | POA: Diagnosis not present

## 2017-05-24 DIAGNOSIS — B351 Tinea unguium: Secondary | ICD-10-CM | POA: Diagnosis not present

## 2017-05-24 DIAGNOSIS — L603 Nail dystrophy: Secondary | ICD-10-CM | POA: Diagnosis not present

## 2017-05-24 DIAGNOSIS — Q845 Enlarged and hypertrophic nails: Secondary | ICD-10-CM | POA: Diagnosis not present

## 2017-05-24 DIAGNOSIS — I129 Hypertensive chronic kidney disease with stage 1 through stage 4 chronic kidney disease, or unspecified chronic kidney disease: Secondary | ICD-10-CM | POA: Diagnosis not present

## 2017-05-24 DIAGNOSIS — Z9181 History of falling: Secondary | ICD-10-CM | POA: Diagnosis not present

## 2017-05-24 DIAGNOSIS — M2010 Hallux valgus (acquired), unspecified foot: Secondary | ICD-10-CM | POA: Diagnosis not present

## 2017-05-24 DIAGNOSIS — Z4800 Encounter for change or removal of nonsurgical wound dressing: Secondary | ICD-10-CM | POA: Diagnosis not present

## 2017-05-26 DIAGNOSIS — L97302 Non-pressure chronic ulcer of unspecified ankle with fat layer exposed: Secondary | ICD-10-CM | POA: Diagnosis not present

## 2017-05-26 DIAGNOSIS — M1990 Unspecified osteoarthritis, unspecified site: Secondary | ICD-10-CM | POA: Diagnosis not present

## 2017-05-26 DIAGNOSIS — R531 Weakness: Secondary | ICD-10-CM | POA: Diagnosis not present

## 2017-05-26 DIAGNOSIS — Z87440 Personal history of urinary (tract) infections: Secondary | ICD-10-CM | POA: Diagnosis not present

## 2017-05-26 DIAGNOSIS — Z9181 History of falling: Secondary | ICD-10-CM | POA: Diagnosis not present

## 2017-05-26 DIAGNOSIS — N183 Chronic kidney disease, stage 3 (moderate): Secondary | ICD-10-CM | POA: Diagnosis not present

## 2017-05-26 DIAGNOSIS — E1122 Type 2 diabetes mellitus with diabetic chronic kidney disease: Secondary | ICD-10-CM | POA: Diagnosis not present

## 2017-05-26 DIAGNOSIS — I129 Hypertensive chronic kidney disease with stage 1 through stage 4 chronic kidney disease, or unspecified chronic kidney disease: Secondary | ICD-10-CM | POA: Diagnosis not present

## 2017-05-26 DIAGNOSIS — Z79891 Long term (current) use of opiate analgesic: Secondary | ICD-10-CM | POA: Diagnosis not present

## 2017-05-26 DIAGNOSIS — L97412 Non-pressure chronic ulcer of right heel and midfoot with fat layer exposed: Secondary | ICD-10-CM | POA: Diagnosis not present

## 2017-05-26 DIAGNOSIS — E11621 Type 2 diabetes mellitus with foot ulcer: Secondary | ICD-10-CM | POA: Diagnosis not present

## 2017-05-26 DIAGNOSIS — Z4800 Encounter for change or removal of nonsurgical wound dressing: Secondary | ICD-10-CM | POA: Diagnosis not present

## 2017-05-28 DIAGNOSIS — L97302 Non-pressure chronic ulcer of unspecified ankle with fat layer exposed: Secondary | ICD-10-CM | POA: Diagnosis not present

## 2017-05-28 DIAGNOSIS — Z9181 History of falling: Secondary | ICD-10-CM | POA: Diagnosis not present

## 2017-05-28 DIAGNOSIS — L97412 Non-pressure chronic ulcer of right heel and midfoot with fat layer exposed: Secondary | ICD-10-CM | POA: Diagnosis not present

## 2017-05-28 DIAGNOSIS — Z87440 Personal history of urinary (tract) infections: Secondary | ICD-10-CM | POA: Diagnosis not present

## 2017-05-28 DIAGNOSIS — Z79891 Long term (current) use of opiate analgesic: Secondary | ICD-10-CM | POA: Diagnosis not present

## 2017-05-28 DIAGNOSIS — R531 Weakness: Secondary | ICD-10-CM | POA: Diagnosis not present

## 2017-05-28 DIAGNOSIS — N183 Chronic kidney disease, stage 3 (moderate): Secondary | ICD-10-CM | POA: Diagnosis not present

## 2017-05-28 DIAGNOSIS — I129 Hypertensive chronic kidney disease with stage 1 through stage 4 chronic kidney disease, or unspecified chronic kidney disease: Secondary | ICD-10-CM | POA: Diagnosis not present

## 2017-05-28 DIAGNOSIS — E11621 Type 2 diabetes mellitus with foot ulcer: Secondary | ICD-10-CM | POA: Diagnosis not present

## 2017-05-28 DIAGNOSIS — Z4800 Encounter for change or removal of nonsurgical wound dressing: Secondary | ICD-10-CM | POA: Diagnosis not present

## 2017-05-28 DIAGNOSIS — E1122 Type 2 diabetes mellitus with diabetic chronic kidney disease: Secondary | ICD-10-CM | POA: Diagnosis not present

## 2017-05-28 DIAGNOSIS — M1990 Unspecified osteoarthritis, unspecified site: Secondary | ICD-10-CM | POA: Diagnosis not present

## 2017-05-30 DIAGNOSIS — L97412 Non-pressure chronic ulcer of right heel and midfoot with fat layer exposed: Secondary | ICD-10-CM | POA: Diagnosis not present

## 2017-05-30 DIAGNOSIS — Z4800 Encounter for change or removal of nonsurgical wound dressing: Secondary | ICD-10-CM | POA: Diagnosis not present

## 2017-05-30 DIAGNOSIS — N183 Chronic kidney disease, stage 3 (moderate): Secondary | ICD-10-CM | POA: Diagnosis not present

## 2017-05-30 DIAGNOSIS — E11621 Type 2 diabetes mellitus with foot ulcer: Secondary | ICD-10-CM | POA: Diagnosis not present

## 2017-05-30 DIAGNOSIS — Z9181 History of falling: Secondary | ICD-10-CM | POA: Diagnosis not present

## 2017-05-30 DIAGNOSIS — I129 Hypertensive chronic kidney disease with stage 1 through stage 4 chronic kidney disease, or unspecified chronic kidney disease: Secondary | ICD-10-CM | POA: Diagnosis not present

## 2017-05-30 DIAGNOSIS — M1990 Unspecified osteoarthritis, unspecified site: Secondary | ICD-10-CM | POA: Diagnosis not present

## 2017-05-30 DIAGNOSIS — L97302 Non-pressure chronic ulcer of unspecified ankle with fat layer exposed: Secondary | ICD-10-CM | POA: Diagnosis not present

## 2017-05-30 DIAGNOSIS — Z87440 Personal history of urinary (tract) infections: Secondary | ICD-10-CM | POA: Diagnosis not present

## 2017-05-30 DIAGNOSIS — E1122 Type 2 diabetes mellitus with diabetic chronic kidney disease: Secondary | ICD-10-CM | POA: Diagnosis not present

## 2017-05-30 DIAGNOSIS — R531 Weakness: Secondary | ICD-10-CM | POA: Diagnosis not present

## 2017-05-30 DIAGNOSIS — Z79891 Long term (current) use of opiate analgesic: Secondary | ICD-10-CM | POA: Diagnosis not present

## 2017-05-31 DIAGNOSIS — Z9181 History of falling: Secondary | ICD-10-CM | POA: Diagnosis not present

## 2017-05-31 DIAGNOSIS — Z4800 Encounter for change or removal of nonsurgical wound dressing: Secondary | ICD-10-CM | POA: Diagnosis not present

## 2017-05-31 DIAGNOSIS — E1122 Type 2 diabetes mellitus with diabetic chronic kidney disease: Secondary | ICD-10-CM | POA: Diagnosis not present

## 2017-05-31 DIAGNOSIS — N183 Chronic kidney disease, stage 3 (moderate): Secondary | ICD-10-CM | POA: Diagnosis not present

## 2017-05-31 DIAGNOSIS — I129 Hypertensive chronic kidney disease with stage 1 through stage 4 chronic kidney disease, or unspecified chronic kidney disease: Secondary | ICD-10-CM | POA: Diagnosis not present

## 2017-05-31 DIAGNOSIS — R531 Weakness: Secondary | ICD-10-CM | POA: Diagnosis not present

## 2017-05-31 DIAGNOSIS — Z79891 Long term (current) use of opiate analgesic: Secondary | ICD-10-CM | POA: Diagnosis not present

## 2017-05-31 DIAGNOSIS — L97412 Non-pressure chronic ulcer of right heel and midfoot with fat layer exposed: Secondary | ICD-10-CM | POA: Diagnosis not present

## 2017-05-31 DIAGNOSIS — Z87440 Personal history of urinary (tract) infections: Secondary | ICD-10-CM | POA: Diagnosis not present

## 2017-05-31 DIAGNOSIS — M1990 Unspecified osteoarthritis, unspecified site: Secondary | ICD-10-CM | POA: Diagnosis not present

## 2017-05-31 DIAGNOSIS — E11621 Type 2 diabetes mellitus with foot ulcer: Secondary | ICD-10-CM | POA: Diagnosis not present

## 2017-05-31 DIAGNOSIS — L97302 Non-pressure chronic ulcer of unspecified ankle with fat layer exposed: Secondary | ICD-10-CM | POA: Diagnosis not present

## 2017-06-01 DIAGNOSIS — L97302 Non-pressure chronic ulcer of unspecified ankle with fat layer exposed: Secondary | ICD-10-CM | POA: Diagnosis not present

## 2017-06-01 DIAGNOSIS — Z79891 Long term (current) use of opiate analgesic: Secondary | ICD-10-CM | POA: Diagnosis not present

## 2017-06-01 DIAGNOSIS — Z9181 History of falling: Secondary | ICD-10-CM | POA: Diagnosis not present

## 2017-06-01 DIAGNOSIS — N183 Chronic kidney disease, stage 3 (moderate): Secondary | ICD-10-CM | POA: Diagnosis not present

## 2017-06-01 DIAGNOSIS — E1122 Type 2 diabetes mellitus with diabetic chronic kidney disease: Secondary | ICD-10-CM | POA: Diagnosis not present

## 2017-06-01 DIAGNOSIS — Z4800 Encounter for change or removal of nonsurgical wound dressing: Secondary | ICD-10-CM | POA: Diagnosis not present

## 2017-06-01 DIAGNOSIS — E11621 Type 2 diabetes mellitus with foot ulcer: Secondary | ICD-10-CM | POA: Diagnosis not present

## 2017-06-01 DIAGNOSIS — Z87440 Personal history of urinary (tract) infections: Secondary | ICD-10-CM | POA: Diagnosis not present

## 2017-06-01 DIAGNOSIS — L97412 Non-pressure chronic ulcer of right heel and midfoot with fat layer exposed: Secondary | ICD-10-CM | POA: Diagnosis not present

## 2017-06-01 DIAGNOSIS — M1990 Unspecified osteoarthritis, unspecified site: Secondary | ICD-10-CM | POA: Diagnosis not present

## 2017-06-01 DIAGNOSIS — R531 Weakness: Secondary | ICD-10-CM | POA: Diagnosis not present

## 2017-06-01 DIAGNOSIS — I129 Hypertensive chronic kidney disease with stage 1 through stage 4 chronic kidney disease, or unspecified chronic kidney disease: Secondary | ICD-10-CM | POA: Diagnosis not present

## 2017-06-02 DIAGNOSIS — L97302 Non-pressure chronic ulcer of unspecified ankle with fat layer exposed: Secondary | ICD-10-CM | POA: Diagnosis not present

## 2017-06-02 DIAGNOSIS — N183 Chronic kidney disease, stage 3 (moderate): Secondary | ICD-10-CM | POA: Diagnosis not present

## 2017-06-02 DIAGNOSIS — L97412 Non-pressure chronic ulcer of right heel and midfoot with fat layer exposed: Secondary | ICD-10-CM | POA: Diagnosis not present

## 2017-06-02 DIAGNOSIS — Z87440 Personal history of urinary (tract) infections: Secondary | ICD-10-CM | POA: Diagnosis not present

## 2017-06-02 DIAGNOSIS — Z4800 Encounter for change or removal of nonsurgical wound dressing: Secondary | ICD-10-CM | POA: Diagnosis not present

## 2017-06-02 DIAGNOSIS — R531 Weakness: Secondary | ICD-10-CM | POA: Diagnosis not present

## 2017-06-02 DIAGNOSIS — Z79891 Long term (current) use of opiate analgesic: Secondary | ICD-10-CM | POA: Diagnosis not present

## 2017-06-02 DIAGNOSIS — Z9181 History of falling: Secondary | ICD-10-CM | POA: Diagnosis not present

## 2017-06-02 DIAGNOSIS — M1990 Unspecified osteoarthritis, unspecified site: Secondary | ICD-10-CM | POA: Diagnosis not present

## 2017-06-02 DIAGNOSIS — E1122 Type 2 diabetes mellitus with diabetic chronic kidney disease: Secondary | ICD-10-CM | POA: Diagnosis not present

## 2017-06-02 DIAGNOSIS — E11621 Type 2 diabetes mellitus with foot ulcer: Secondary | ICD-10-CM | POA: Diagnosis not present

## 2017-06-02 DIAGNOSIS — I129 Hypertensive chronic kidney disease with stage 1 through stage 4 chronic kidney disease, or unspecified chronic kidney disease: Secondary | ICD-10-CM | POA: Diagnosis not present

## 2017-06-03 DIAGNOSIS — Z87440 Personal history of urinary (tract) infections: Secondary | ICD-10-CM | POA: Diagnosis not present

## 2017-06-03 DIAGNOSIS — E1122 Type 2 diabetes mellitus with diabetic chronic kidney disease: Secondary | ICD-10-CM | POA: Diagnosis not present

## 2017-06-03 DIAGNOSIS — Z9181 History of falling: Secondary | ICD-10-CM | POA: Diagnosis not present

## 2017-06-03 DIAGNOSIS — Z79891 Long term (current) use of opiate analgesic: Secondary | ICD-10-CM | POA: Diagnosis not present

## 2017-06-03 DIAGNOSIS — M1990 Unspecified osteoarthritis, unspecified site: Secondary | ICD-10-CM | POA: Diagnosis not present

## 2017-06-03 DIAGNOSIS — E11621 Type 2 diabetes mellitus with foot ulcer: Secondary | ICD-10-CM | POA: Diagnosis not present

## 2017-06-03 DIAGNOSIS — N183 Chronic kidney disease, stage 3 (moderate): Secondary | ICD-10-CM | POA: Diagnosis not present

## 2017-06-03 DIAGNOSIS — L97302 Non-pressure chronic ulcer of unspecified ankle with fat layer exposed: Secondary | ICD-10-CM | POA: Diagnosis not present

## 2017-06-03 DIAGNOSIS — Z4800 Encounter for change or removal of nonsurgical wound dressing: Secondary | ICD-10-CM | POA: Diagnosis not present

## 2017-06-03 DIAGNOSIS — I129 Hypertensive chronic kidney disease with stage 1 through stage 4 chronic kidney disease, or unspecified chronic kidney disease: Secondary | ICD-10-CM | POA: Diagnosis not present

## 2017-06-03 DIAGNOSIS — L97412 Non-pressure chronic ulcer of right heel and midfoot with fat layer exposed: Secondary | ICD-10-CM | POA: Diagnosis not present

## 2017-06-03 DIAGNOSIS — R531 Weakness: Secondary | ICD-10-CM | POA: Diagnosis not present

## 2017-06-04 DIAGNOSIS — M79671 Pain in right foot: Secondary | ICD-10-CM | POA: Diagnosis not present

## 2017-06-04 DIAGNOSIS — Z4800 Encounter for change or removal of nonsurgical wound dressing: Secondary | ICD-10-CM | POA: Diagnosis not present

## 2017-06-04 DIAGNOSIS — I129 Hypertensive chronic kidney disease with stage 1 through stage 4 chronic kidney disease, or unspecified chronic kidney disease: Secondary | ICD-10-CM | POA: Diagnosis not present

## 2017-06-04 DIAGNOSIS — L97302 Non-pressure chronic ulcer of unspecified ankle with fat layer exposed: Secondary | ICD-10-CM | POA: Diagnosis not present

## 2017-06-04 DIAGNOSIS — R531 Weakness: Secondary | ICD-10-CM | POA: Diagnosis not present

## 2017-06-04 DIAGNOSIS — E11621 Type 2 diabetes mellitus with foot ulcer: Secondary | ICD-10-CM | POA: Diagnosis not present

## 2017-06-04 DIAGNOSIS — L97412 Non-pressure chronic ulcer of right heel and midfoot with fat layer exposed: Secondary | ICD-10-CM | POA: Diagnosis not present

## 2017-06-04 DIAGNOSIS — G894 Chronic pain syndrome: Secondary | ICD-10-CM | POA: Diagnosis not present

## 2017-06-04 DIAGNOSIS — M1990 Unspecified osteoarthritis, unspecified site: Secondary | ICD-10-CM | POA: Diagnosis not present

## 2017-06-04 DIAGNOSIS — N183 Chronic kidney disease, stage 3 (moderate): Secondary | ICD-10-CM | POA: Diagnosis not present

## 2017-06-04 DIAGNOSIS — Z87440 Personal history of urinary (tract) infections: Secondary | ICD-10-CM | POA: Diagnosis not present

## 2017-06-04 DIAGNOSIS — E1122 Type 2 diabetes mellitus with diabetic chronic kidney disease: Secondary | ICD-10-CM | POA: Diagnosis not present

## 2017-06-04 DIAGNOSIS — Z9181 History of falling: Secondary | ICD-10-CM | POA: Diagnosis not present

## 2017-06-04 DIAGNOSIS — Z79891 Long term (current) use of opiate analgesic: Secondary | ICD-10-CM | POA: Diagnosis not present

## 2017-06-07 DIAGNOSIS — L97302 Non-pressure chronic ulcer of unspecified ankle with fat layer exposed: Secondary | ICD-10-CM | POA: Diagnosis not present

## 2017-06-07 DIAGNOSIS — I129 Hypertensive chronic kidney disease with stage 1 through stage 4 chronic kidney disease, or unspecified chronic kidney disease: Secondary | ICD-10-CM | POA: Diagnosis not present

## 2017-06-07 DIAGNOSIS — R531 Weakness: Secondary | ICD-10-CM | POA: Diagnosis not present

## 2017-06-07 DIAGNOSIS — M1990 Unspecified osteoarthritis, unspecified site: Secondary | ICD-10-CM | POA: Diagnosis not present

## 2017-06-07 DIAGNOSIS — Z79891 Long term (current) use of opiate analgesic: Secondary | ICD-10-CM | POA: Diagnosis not present

## 2017-06-07 DIAGNOSIS — L97412 Non-pressure chronic ulcer of right heel and midfoot with fat layer exposed: Secondary | ICD-10-CM | POA: Diagnosis not present

## 2017-06-07 DIAGNOSIS — N183 Chronic kidney disease, stage 3 (moderate): Secondary | ICD-10-CM | POA: Diagnosis not present

## 2017-06-07 DIAGNOSIS — E1122 Type 2 diabetes mellitus with diabetic chronic kidney disease: Secondary | ICD-10-CM | POA: Diagnosis not present

## 2017-06-07 DIAGNOSIS — Z87440 Personal history of urinary (tract) infections: Secondary | ICD-10-CM | POA: Diagnosis not present

## 2017-06-07 DIAGNOSIS — Z4800 Encounter for change or removal of nonsurgical wound dressing: Secondary | ICD-10-CM | POA: Diagnosis not present

## 2017-06-07 DIAGNOSIS — Z9181 History of falling: Secondary | ICD-10-CM | POA: Diagnosis not present

## 2017-06-07 DIAGNOSIS — E11621 Type 2 diabetes mellitus with foot ulcer: Secondary | ICD-10-CM | POA: Diagnosis not present

## 2017-06-08 DIAGNOSIS — Z87440 Personal history of urinary (tract) infections: Secondary | ICD-10-CM | POA: Diagnosis not present

## 2017-06-08 DIAGNOSIS — N183 Chronic kidney disease, stage 3 (moderate): Secondary | ICD-10-CM | POA: Diagnosis not present

## 2017-06-08 DIAGNOSIS — Z4800 Encounter for change or removal of nonsurgical wound dressing: Secondary | ICD-10-CM | POA: Diagnosis not present

## 2017-06-08 DIAGNOSIS — L97412 Non-pressure chronic ulcer of right heel and midfoot with fat layer exposed: Secondary | ICD-10-CM | POA: Diagnosis not present

## 2017-06-08 DIAGNOSIS — Z79891 Long term (current) use of opiate analgesic: Secondary | ICD-10-CM | POA: Diagnosis not present

## 2017-06-08 DIAGNOSIS — E11621 Type 2 diabetes mellitus with foot ulcer: Secondary | ICD-10-CM | POA: Diagnosis not present

## 2017-06-08 DIAGNOSIS — L97302 Non-pressure chronic ulcer of unspecified ankle with fat layer exposed: Secondary | ICD-10-CM | POA: Diagnosis not present

## 2017-06-08 DIAGNOSIS — Z9181 History of falling: Secondary | ICD-10-CM | POA: Diagnosis not present

## 2017-06-08 DIAGNOSIS — R531 Weakness: Secondary | ICD-10-CM | POA: Diagnosis not present

## 2017-06-08 DIAGNOSIS — E1122 Type 2 diabetes mellitus with diabetic chronic kidney disease: Secondary | ICD-10-CM | POA: Diagnosis not present

## 2017-06-08 DIAGNOSIS — I129 Hypertensive chronic kidney disease with stage 1 through stage 4 chronic kidney disease, or unspecified chronic kidney disease: Secondary | ICD-10-CM | POA: Diagnosis not present

## 2017-06-08 DIAGNOSIS — M1990 Unspecified osteoarthritis, unspecified site: Secondary | ICD-10-CM | POA: Diagnosis not present

## 2017-06-10 DIAGNOSIS — R531 Weakness: Secondary | ICD-10-CM | POA: Diagnosis not present

## 2017-06-10 DIAGNOSIS — I129 Hypertensive chronic kidney disease with stage 1 through stage 4 chronic kidney disease, or unspecified chronic kidney disease: Secondary | ICD-10-CM | POA: Diagnosis not present

## 2017-06-10 DIAGNOSIS — Z79891 Long term (current) use of opiate analgesic: Secondary | ICD-10-CM | POA: Diagnosis not present

## 2017-06-10 DIAGNOSIS — E11621 Type 2 diabetes mellitus with foot ulcer: Secondary | ICD-10-CM | POA: Diagnosis not present

## 2017-06-10 DIAGNOSIS — M1990 Unspecified osteoarthritis, unspecified site: Secondary | ICD-10-CM | POA: Diagnosis not present

## 2017-06-10 DIAGNOSIS — Z9181 History of falling: Secondary | ICD-10-CM | POA: Diagnosis not present

## 2017-06-10 DIAGNOSIS — L97302 Non-pressure chronic ulcer of unspecified ankle with fat layer exposed: Secondary | ICD-10-CM | POA: Diagnosis not present

## 2017-06-10 DIAGNOSIS — E1122 Type 2 diabetes mellitus with diabetic chronic kidney disease: Secondary | ICD-10-CM | POA: Diagnosis not present

## 2017-06-10 DIAGNOSIS — Z4800 Encounter for change or removal of nonsurgical wound dressing: Secondary | ICD-10-CM | POA: Diagnosis not present

## 2017-06-10 DIAGNOSIS — N183 Chronic kidney disease, stage 3 (moderate): Secondary | ICD-10-CM | POA: Diagnosis not present

## 2017-06-10 DIAGNOSIS — L97412 Non-pressure chronic ulcer of right heel and midfoot with fat layer exposed: Secondary | ICD-10-CM | POA: Diagnosis not present

## 2017-06-10 DIAGNOSIS — Z87440 Personal history of urinary (tract) infections: Secondary | ICD-10-CM | POA: Diagnosis not present

## 2017-06-15 DIAGNOSIS — R531 Weakness: Secondary | ICD-10-CM | POA: Diagnosis not present

## 2017-06-15 DIAGNOSIS — N183 Chronic kidney disease, stage 3 (moderate): Secondary | ICD-10-CM | POA: Diagnosis not present

## 2017-06-15 DIAGNOSIS — Z4800 Encounter for change or removal of nonsurgical wound dressing: Secondary | ICD-10-CM | POA: Diagnosis not present

## 2017-06-15 DIAGNOSIS — E1122 Type 2 diabetes mellitus with diabetic chronic kidney disease: Secondary | ICD-10-CM | POA: Diagnosis not present

## 2017-06-15 DIAGNOSIS — Z87440 Personal history of urinary (tract) infections: Secondary | ICD-10-CM | POA: Diagnosis not present

## 2017-06-15 DIAGNOSIS — L97302 Non-pressure chronic ulcer of unspecified ankle with fat layer exposed: Secondary | ICD-10-CM | POA: Diagnosis not present

## 2017-06-15 DIAGNOSIS — E11621 Type 2 diabetes mellitus with foot ulcer: Secondary | ICD-10-CM | POA: Diagnosis not present

## 2017-06-15 DIAGNOSIS — I129 Hypertensive chronic kidney disease with stage 1 through stage 4 chronic kidney disease, or unspecified chronic kidney disease: Secondary | ICD-10-CM | POA: Diagnosis not present

## 2017-06-15 DIAGNOSIS — Z79891 Long term (current) use of opiate analgesic: Secondary | ICD-10-CM | POA: Diagnosis not present

## 2017-06-15 DIAGNOSIS — M1990 Unspecified osteoarthritis, unspecified site: Secondary | ICD-10-CM | POA: Diagnosis not present

## 2017-06-15 DIAGNOSIS — Z9181 History of falling: Secondary | ICD-10-CM | POA: Diagnosis not present

## 2017-06-15 DIAGNOSIS — L97412 Non-pressure chronic ulcer of right heel and midfoot with fat layer exposed: Secondary | ICD-10-CM | POA: Diagnosis not present

## 2017-06-18 DIAGNOSIS — Z87440 Personal history of urinary (tract) infections: Secondary | ICD-10-CM | POA: Diagnosis not present

## 2017-06-18 DIAGNOSIS — L97412 Non-pressure chronic ulcer of right heel and midfoot with fat layer exposed: Secondary | ICD-10-CM | POA: Diagnosis not present

## 2017-06-18 DIAGNOSIS — Z4800 Encounter for change or removal of nonsurgical wound dressing: Secondary | ICD-10-CM | POA: Diagnosis not present

## 2017-06-18 DIAGNOSIS — N183 Chronic kidney disease, stage 3 (moderate): Secondary | ICD-10-CM | POA: Diagnosis not present

## 2017-06-18 DIAGNOSIS — E11621 Type 2 diabetes mellitus with foot ulcer: Secondary | ICD-10-CM | POA: Diagnosis not present

## 2017-06-18 DIAGNOSIS — M1990 Unspecified osteoarthritis, unspecified site: Secondary | ICD-10-CM | POA: Diagnosis not present

## 2017-06-18 DIAGNOSIS — Z79891 Long term (current) use of opiate analgesic: Secondary | ICD-10-CM | POA: Diagnosis not present

## 2017-06-18 DIAGNOSIS — R531 Weakness: Secondary | ICD-10-CM | POA: Diagnosis not present

## 2017-06-18 DIAGNOSIS — E1122 Type 2 diabetes mellitus with diabetic chronic kidney disease: Secondary | ICD-10-CM | POA: Diagnosis not present

## 2017-06-18 DIAGNOSIS — L97302 Non-pressure chronic ulcer of unspecified ankle with fat layer exposed: Secondary | ICD-10-CM | POA: Diagnosis not present

## 2017-06-18 DIAGNOSIS — Z9181 History of falling: Secondary | ICD-10-CM | POA: Diagnosis not present

## 2017-06-18 DIAGNOSIS — I129 Hypertensive chronic kidney disease with stage 1 through stage 4 chronic kidney disease, or unspecified chronic kidney disease: Secondary | ICD-10-CM | POA: Diagnosis not present

## 2017-06-19 DIAGNOSIS — E1159 Type 2 diabetes mellitus with other circulatory complications: Secondary | ICD-10-CM | POA: Diagnosis not present

## 2017-06-25 DIAGNOSIS — K5900 Constipation, unspecified: Secondary | ICD-10-CM | POA: Diagnosis not present

## 2017-06-25 DIAGNOSIS — L89892 Pressure ulcer of other site, stage 2: Secondary | ICD-10-CM | POA: Diagnosis not present

## 2017-06-25 DIAGNOSIS — R112 Nausea with vomiting, unspecified: Secondary | ICD-10-CM | POA: Diagnosis not present

## 2017-06-25 DIAGNOSIS — I10 Essential (primary) hypertension: Secondary | ICD-10-CM | POA: Diagnosis not present

## 2017-06-25 DIAGNOSIS — E119 Type 2 diabetes mellitus without complications: Secondary | ICD-10-CM | POA: Diagnosis not present

## 2017-07-06 DIAGNOSIS — G894 Chronic pain syndrome: Secondary | ICD-10-CM | POA: Diagnosis not present

## 2017-07-06 DIAGNOSIS — M79671 Pain in right foot: Secondary | ICD-10-CM | POA: Diagnosis not present

## 2017-07-15 DIAGNOSIS — M6281 Muscle weakness (generalized): Secondary | ICD-10-CM | POA: Diagnosis not present

## 2017-07-15 DIAGNOSIS — E119 Type 2 diabetes mellitus without complications: Secondary | ICD-10-CM | POA: Diagnosis not present

## 2017-07-15 DIAGNOSIS — Z87440 Personal history of urinary (tract) infections: Secondary | ICD-10-CM | POA: Diagnosis not present

## 2017-07-15 DIAGNOSIS — R269 Unspecified abnormalities of gait and mobility: Secondary | ICD-10-CM | POA: Diagnosis not present

## 2017-07-15 DIAGNOSIS — Z9181 History of falling: Secondary | ICD-10-CM | POA: Diagnosis not present

## 2017-07-15 DIAGNOSIS — L97519 Non-pressure chronic ulcer of other part of right foot with unspecified severity: Secondary | ICD-10-CM | POA: Diagnosis not present

## 2017-07-19 DIAGNOSIS — Z9181 History of falling: Secondary | ICD-10-CM | POA: Diagnosis not present

## 2017-07-19 DIAGNOSIS — M6281 Muscle weakness (generalized): Secondary | ICD-10-CM | POA: Diagnosis not present

## 2017-07-19 DIAGNOSIS — E119 Type 2 diabetes mellitus without complications: Secondary | ICD-10-CM | POA: Diagnosis not present

## 2017-07-19 DIAGNOSIS — L97519 Non-pressure chronic ulcer of other part of right foot with unspecified severity: Secondary | ICD-10-CM | POA: Diagnosis not present

## 2017-07-19 DIAGNOSIS — Z87440 Personal history of urinary (tract) infections: Secondary | ICD-10-CM | POA: Diagnosis not present

## 2017-07-19 DIAGNOSIS — R269 Unspecified abnormalities of gait and mobility: Secondary | ICD-10-CM | POA: Diagnosis not present

## 2017-07-21 DIAGNOSIS — L97519 Non-pressure chronic ulcer of other part of right foot with unspecified severity: Secondary | ICD-10-CM | POA: Diagnosis not present

## 2017-07-21 DIAGNOSIS — E119 Type 2 diabetes mellitus without complications: Secondary | ICD-10-CM | POA: Diagnosis not present

## 2017-07-21 DIAGNOSIS — M6281 Muscle weakness (generalized): Secondary | ICD-10-CM | POA: Diagnosis not present

## 2017-07-21 DIAGNOSIS — Z9181 History of falling: Secondary | ICD-10-CM | POA: Diagnosis not present

## 2017-07-21 DIAGNOSIS — Z87440 Personal history of urinary (tract) infections: Secondary | ICD-10-CM | POA: Diagnosis not present

## 2017-07-21 DIAGNOSIS — R269 Unspecified abnormalities of gait and mobility: Secondary | ICD-10-CM | POA: Diagnosis not present

## 2017-07-26 DIAGNOSIS — E119 Type 2 diabetes mellitus without complications: Secondary | ICD-10-CM | POA: Diagnosis not present

## 2017-07-26 DIAGNOSIS — M6281 Muscle weakness (generalized): Secondary | ICD-10-CM | POA: Diagnosis not present

## 2017-07-26 DIAGNOSIS — Z9181 History of falling: Secondary | ICD-10-CM | POA: Diagnosis not present

## 2017-07-26 DIAGNOSIS — R269 Unspecified abnormalities of gait and mobility: Secondary | ICD-10-CM | POA: Diagnosis not present

## 2017-07-26 DIAGNOSIS — Z87440 Personal history of urinary (tract) infections: Secondary | ICD-10-CM | POA: Diagnosis not present

## 2017-07-26 DIAGNOSIS — L97519 Non-pressure chronic ulcer of other part of right foot with unspecified severity: Secondary | ICD-10-CM | POA: Diagnosis not present

## 2017-07-28 DIAGNOSIS — Z9181 History of falling: Secondary | ICD-10-CM | POA: Diagnosis not present

## 2017-07-28 DIAGNOSIS — Z87440 Personal history of urinary (tract) infections: Secondary | ICD-10-CM | POA: Diagnosis not present

## 2017-07-28 DIAGNOSIS — M6281 Muscle weakness (generalized): Secondary | ICD-10-CM | POA: Diagnosis not present

## 2017-07-28 DIAGNOSIS — R269 Unspecified abnormalities of gait and mobility: Secondary | ICD-10-CM | POA: Diagnosis not present

## 2017-07-28 DIAGNOSIS — E119 Type 2 diabetes mellitus without complications: Secondary | ICD-10-CM | POA: Diagnosis not present

## 2017-07-28 DIAGNOSIS — L97519 Non-pressure chronic ulcer of other part of right foot with unspecified severity: Secondary | ICD-10-CM | POA: Diagnosis not present

## 2017-07-30 DIAGNOSIS — G894 Chronic pain syndrome: Secondary | ICD-10-CM | POA: Diagnosis not present

## 2017-07-30 DIAGNOSIS — M79671 Pain in right foot: Secondary | ICD-10-CM | POA: Diagnosis not present

## 2017-08-02 DIAGNOSIS — Z9181 History of falling: Secondary | ICD-10-CM | POA: Diagnosis not present

## 2017-08-02 DIAGNOSIS — Z87440 Personal history of urinary (tract) infections: Secondary | ICD-10-CM | POA: Diagnosis not present

## 2017-08-02 DIAGNOSIS — L97519 Non-pressure chronic ulcer of other part of right foot with unspecified severity: Secondary | ICD-10-CM | POA: Diagnosis not present

## 2017-08-02 DIAGNOSIS — R269 Unspecified abnormalities of gait and mobility: Secondary | ICD-10-CM | POA: Diagnosis not present

## 2017-08-02 DIAGNOSIS — E119 Type 2 diabetes mellitus without complications: Secondary | ICD-10-CM | POA: Diagnosis not present

## 2017-08-02 DIAGNOSIS — M6281 Muscle weakness (generalized): Secondary | ICD-10-CM | POA: Diagnosis not present

## 2017-08-04 DIAGNOSIS — E119 Type 2 diabetes mellitus without complications: Secondary | ICD-10-CM | POA: Diagnosis not present

## 2017-08-04 DIAGNOSIS — Z9181 History of falling: Secondary | ICD-10-CM | POA: Diagnosis not present

## 2017-08-04 DIAGNOSIS — M6281 Muscle weakness (generalized): Secondary | ICD-10-CM | POA: Diagnosis not present

## 2017-08-04 DIAGNOSIS — Z87440 Personal history of urinary (tract) infections: Secondary | ICD-10-CM | POA: Diagnosis not present

## 2017-08-04 DIAGNOSIS — R269 Unspecified abnormalities of gait and mobility: Secondary | ICD-10-CM | POA: Diagnosis not present

## 2017-08-04 DIAGNOSIS — L97519 Non-pressure chronic ulcer of other part of right foot with unspecified severity: Secondary | ICD-10-CM | POA: Diagnosis not present

## 2017-08-06 DIAGNOSIS — B379 Candidiasis, unspecified: Secondary | ICD-10-CM | POA: Diagnosis not present

## 2017-08-06 DIAGNOSIS — K5900 Constipation, unspecified: Secondary | ICD-10-CM | POA: Diagnosis not present

## 2017-08-06 DIAGNOSIS — L89892 Pressure ulcer of other site, stage 2: Secondary | ICD-10-CM | POA: Diagnosis not present

## 2017-08-06 DIAGNOSIS — G8929 Other chronic pain: Secondary | ICD-10-CM | POA: Diagnosis not present

## 2017-08-06 DIAGNOSIS — E119 Type 2 diabetes mellitus without complications: Secondary | ICD-10-CM | POA: Diagnosis not present

## 2017-08-10 DIAGNOSIS — E119 Type 2 diabetes mellitus without complications: Secondary | ICD-10-CM | POA: Diagnosis not present

## 2017-08-10 DIAGNOSIS — L97519 Non-pressure chronic ulcer of other part of right foot with unspecified severity: Secondary | ICD-10-CM | POA: Diagnosis not present

## 2017-08-10 DIAGNOSIS — Z9181 History of falling: Secondary | ICD-10-CM | POA: Diagnosis not present

## 2017-08-10 DIAGNOSIS — R269 Unspecified abnormalities of gait and mobility: Secondary | ICD-10-CM | POA: Diagnosis not present

## 2017-08-10 DIAGNOSIS — Z87440 Personal history of urinary (tract) infections: Secondary | ICD-10-CM | POA: Diagnosis not present

## 2017-08-10 DIAGNOSIS — M6281 Muscle weakness (generalized): Secondary | ICD-10-CM | POA: Diagnosis not present

## 2017-08-12 DIAGNOSIS — E119 Type 2 diabetes mellitus without complications: Secondary | ICD-10-CM | POA: Diagnosis not present

## 2017-08-12 DIAGNOSIS — R269 Unspecified abnormalities of gait and mobility: Secondary | ICD-10-CM | POA: Diagnosis not present

## 2017-08-12 DIAGNOSIS — Z87440 Personal history of urinary (tract) infections: Secondary | ICD-10-CM | POA: Diagnosis not present

## 2017-08-12 DIAGNOSIS — L97519 Non-pressure chronic ulcer of other part of right foot with unspecified severity: Secondary | ICD-10-CM | POA: Diagnosis not present

## 2017-08-12 DIAGNOSIS — Z9181 History of falling: Secondary | ICD-10-CM | POA: Diagnosis not present

## 2017-08-12 DIAGNOSIS — M6281 Muscle weakness (generalized): Secondary | ICD-10-CM | POA: Diagnosis not present

## 2017-08-17 DIAGNOSIS — E119 Type 2 diabetes mellitus without complications: Secondary | ICD-10-CM | POA: Diagnosis not present

## 2017-08-17 DIAGNOSIS — L97519 Non-pressure chronic ulcer of other part of right foot with unspecified severity: Secondary | ICD-10-CM | POA: Diagnosis not present

## 2017-08-17 DIAGNOSIS — Z9181 History of falling: Secondary | ICD-10-CM | POA: Diagnosis not present

## 2017-08-17 DIAGNOSIS — M6281 Muscle weakness (generalized): Secondary | ICD-10-CM | POA: Diagnosis not present

## 2017-08-17 DIAGNOSIS — Z87440 Personal history of urinary (tract) infections: Secondary | ICD-10-CM | POA: Diagnosis not present

## 2017-08-17 DIAGNOSIS — R269 Unspecified abnormalities of gait and mobility: Secondary | ICD-10-CM | POA: Diagnosis not present

## 2017-08-19 DIAGNOSIS — Z87440 Personal history of urinary (tract) infections: Secondary | ICD-10-CM | POA: Diagnosis not present

## 2017-08-19 DIAGNOSIS — Z9181 History of falling: Secondary | ICD-10-CM | POA: Diagnosis not present

## 2017-08-19 DIAGNOSIS — E119 Type 2 diabetes mellitus without complications: Secondary | ICD-10-CM | POA: Diagnosis not present

## 2017-08-19 DIAGNOSIS — M6281 Muscle weakness (generalized): Secondary | ICD-10-CM | POA: Diagnosis not present

## 2017-08-19 DIAGNOSIS — L97519 Non-pressure chronic ulcer of other part of right foot with unspecified severity: Secondary | ICD-10-CM | POA: Diagnosis not present

## 2017-08-19 DIAGNOSIS — R269 Unspecified abnormalities of gait and mobility: Secondary | ICD-10-CM | POA: Diagnosis not present

## 2017-08-23 DIAGNOSIS — E119 Type 2 diabetes mellitus without complications: Secondary | ICD-10-CM | POA: Diagnosis not present

## 2017-08-23 DIAGNOSIS — M2010 Hallux valgus (acquired), unspecified foot: Secondary | ICD-10-CM | POA: Diagnosis not present

## 2017-08-23 DIAGNOSIS — B351 Tinea unguium: Secondary | ICD-10-CM | POA: Diagnosis not present

## 2017-08-23 DIAGNOSIS — R600 Localized edema: Secondary | ICD-10-CM | POA: Diagnosis not present

## 2017-08-23 DIAGNOSIS — L603 Nail dystrophy: Secondary | ICD-10-CM | POA: Diagnosis not present

## 2017-09-03 DIAGNOSIS — G894 Chronic pain syndrome: Secondary | ICD-10-CM | POA: Diagnosis not present

## 2017-09-03 DIAGNOSIS — M79671 Pain in right foot: Secondary | ICD-10-CM | POA: Diagnosis not present

## 2017-09-10 DIAGNOSIS — Z0000 Encounter for general adult medical examination without abnormal findings: Secondary | ICD-10-CM | POA: Diagnosis not present

## 2017-09-23 DIAGNOSIS — Z79899 Other long term (current) drug therapy: Secondary | ICD-10-CM | POA: Diagnosis not present

## 2017-10-01 DIAGNOSIS — M79671 Pain in right foot: Secondary | ICD-10-CM | POA: Diagnosis not present

## 2017-10-01 DIAGNOSIS — G894 Chronic pain syndrome: Secondary | ICD-10-CM | POA: Diagnosis not present

## 2017-10-13 ENCOUNTER — Ambulatory Visit (INDEPENDENT_AMBULATORY_CARE_PROVIDER_SITE_OTHER): Payer: Medicare Other | Admitting: Podiatry

## 2017-10-13 ENCOUNTER — Ambulatory Visit (INDEPENDENT_AMBULATORY_CARE_PROVIDER_SITE_OTHER): Payer: Medicare Other

## 2017-10-13 ENCOUNTER — Encounter: Payer: Self-pay | Admitting: Podiatry

## 2017-10-13 DIAGNOSIS — L97401 Non-pressure chronic ulcer of unspecified heel and midfoot limited to breakdown of skin: Secondary | ICD-10-CM | POA: Diagnosis not present

## 2017-10-13 DIAGNOSIS — E0842 Diabetes mellitus due to underlying condition with diabetic polyneuropathy: Secondary | ICD-10-CM

## 2017-10-13 DIAGNOSIS — M79671 Pain in right foot: Secondary | ICD-10-CM

## 2017-10-13 DIAGNOSIS — L84 Corns and callosities: Secondary | ICD-10-CM

## 2017-10-13 DIAGNOSIS — E08621 Diabetes mellitus due to underlying condition with foot ulcer: Secondary | ICD-10-CM | POA: Diagnosis not present

## 2017-10-13 NOTE — Progress Notes (Signed)
   Subjective:    Patient ID: Stephanie Olson, female    DOB: 10-08-1940, 77 y.o.   MRN: [id number]  HPI    Review of Systems  All other systems reviewed and are negative.      Objective:   Physical Exam        Assessment & Plan:

## 2017-10-13 NOTE — Progress Notes (Signed)
Subjective:   Patient ID: Stephanie Olson, female   DOB: 77 y.o.   MRN: [id number]   HPI Patient presents with problems in the outside of the right foot heel and presents with caregiver with patient not being very good historian as to her condition.  Apparently did have a skin graft done in the early 1990s.  Patient does not smoke and likes to be active but is not able to   Review of Systems  All other systems reviewed and are negative.       Objective:  Physical Exam  Constitutional: She appears well-developed and well-nourished.  Cardiovascular: Intact distal pulses.  Pulmonary/Chest: Effort normal.  Musculoskeletal: Normal range of motion.  Neurological: She is alert.  Skin: Skin is warm.  Nursing note and vitals reviewed. Neurovascular status was found to be diminished but intact with diminished sharp dull vibratory and patient was noted to have diminished muscle strength and range of motion.  There is a large graft on the right lateral heel measuring about 8 x 6 cm that is intact and there is a small area of breakdown of tissue plantar proximal to this area measuring about 1 cm x 1 cm that is superficial with no subcutaneous exposure.  Patient does not have any proximal erythema edema or drainage with no odor noted     Assessment:  Appears to be a slight breakdown local in nature with no indications currently of deep breakdown it is not related to the graft     Plan:  H&P education rendered to patient and caregiver and sharp sterile debridement of the tissue was accomplished today.  I then flushed the tissue and applied Iodosorb with sterile dressing and gave instructions on home soaks and Neosporin usage.  This should heal uneventfully but if any issues were to occur patient is to be seen immediately

## 2017-10-22 DIAGNOSIS — E119 Type 2 diabetes mellitus without complications: Secondary | ICD-10-CM | POA: Diagnosis not present

## 2017-10-22 DIAGNOSIS — L84 Corns and callosities: Secondary | ICD-10-CM | POA: Diagnosis not present

## 2017-10-22 DIAGNOSIS — R296 Repeated falls: Secondary | ICD-10-CM | POA: Diagnosis not present

## 2017-10-22 DIAGNOSIS — B379 Candidiasis, unspecified: Secondary | ICD-10-CM | POA: Diagnosis not present

## 2017-10-22 DIAGNOSIS — G8929 Other chronic pain: Secondary | ICD-10-CM | POA: Diagnosis not present

## 2017-10-29 ENCOUNTER — Telehealth: Payer: Self-pay | Admitting: *Deleted

## 2017-10-29 NOTE — Telephone Encounter (Signed)
Stephanie Olson states she would like a stop date for the 10/13/2017 orders.

## 2017-10-29 NOTE — Telephone Encounter (Signed)
Dr. Paulla Dolly states if the area is closed without redness, swelling or drainage the soaks and antibiotic ointment may be stopped. I informed Dyanne Iha of the orders and she request faxed orders to [phone].

## 2017-10-29 NOTE — Telephone Encounter (Signed)
Faxed orders to [facility]: Delcie Roch.

## 2017-11-05 DIAGNOSIS — M79671 Pain in right foot: Secondary | ICD-10-CM | POA: Diagnosis not present

## 2017-11-05 DIAGNOSIS — G894 Chronic pain syndrome: Secondary | ICD-10-CM | POA: Diagnosis not present

## 2017-11-17 ENCOUNTER — Ambulatory Visit (INDEPENDENT_AMBULATORY_CARE_PROVIDER_SITE_OTHER): Payer: Medicare Other | Admitting: Podiatry

## 2017-11-17 ENCOUNTER — Encounter: Payer: Self-pay | Admitting: Podiatry

## 2017-11-17 DIAGNOSIS — E08621 Diabetes mellitus due to underlying condition with foot ulcer: Secondary | ICD-10-CM

## 2017-11-17 DIAGNOSIS — L97401 Non-pressure chronic ulcer of unspecified heel and midfoot limited to breakdown of skin: Secondary | ICD-10-CM | POA: Diagnosis not present

## 2017-11-17 NOTE — Progress Notes (Signed)
Subjective:   Patient ID: Stephanie Olson, female   DOB: 77 y.o.   MRN: [id number]   HPI Patient presents stating she still getting discomfort in the outside of her right foot she wanted it rechecked and she is had a history of 30 years ago of having had a skin graft of the right lateral side of the foot.  Presents with caregiver   ROS      Objective:  Physical Exam  Neurovascular status intact with no other physical changes noted with crusted tissue in the proximal portion where she had the previous skin graft done.  There is an area that could be irritative with sitting or laying down     Assessment:  Localized area with no breakdown of skin no significant nature with no subcutaneous exposure noted and measuring approximately 5 x 5 mm of superficial irritation     Plan:  I debrided the area and did not note any drainage and applied dressing to it to try to dry it out with a small amount of Iodosorb.  I then advised on Epsom salt soaks and patient will be seen back in 4 weeks and may possibly require further grafting of this area if it should not heal.  She will see Dr. March Rummage and I will discuss with him this particular case

## 2017-11-22 DIAGNOSIS — L603 Nail dystrophy: Secondary | ICD-10-CM | POA: Diagnosis not present

## 2017-11-22 DIAGNOSIS — Q845 Enlarged and hypertrophic nails: Secondary | ICD-10-CM | POA: Diagnosis not present

## 2017-11-22 DIAGNOSIS — B351 Tinea unguium: Secondary | ICD-10-CM | POA: Diagnosis not present

## 2017-11-22 DIAGNOSIS — I739 Peripheral vascular disease, unspecified: Secondary | ICD-10-CM | POA: Diagnosis not present

## 2017-11-26 DIAGNOSIS — L84 Corns and callosities: Secondary | ICD-10-CM | POA: Diagnosis not present

## 2017-11-26 DIAGNOSIS — G894 Chronic pain syndrome: Secondary | ICD-10-CM | POA: Diagnosis not present

## 2017-11-26 DIAGNOSIS — R296 Repeated falls: Secondary | ICD-10-CM | POA: Diagnosis not present

## 2017-11-26 DIAGNOSIS — L97509 Non-pressure chronic ulcer of other part of unspecified foot with unspecified severity: Secondary | ICD-10-CM | POA: Diagnosis not present

## 2017-11-26 DIAGNOSIS — I10 Essential (primary) hypertension: Secondary | ICD-10-CM | POA: Diagnosis not present

## 2017-11-30 DIAGNOSIS — E1140 Type 2 diabetes mellitus with diabetic neuropathy, unspecified: Secondary | ICD-10-CM | POA: Diagnosis not present

## 2017-11-30 DIAGNOSIS — I10 Essential (primary) hypertension: Secondary | ICD-10-CM | POA: Diagnosis not present

## 2017-11-30 DIAGNOSIS — M129 Arthropathy, unspecified: Secondary | ICD-10-CM | POA: Diagnosis not present

## 2017-11-30 DIAGNOSIS — S91301A Unspecified open wound, right foot, initial encounter: Secondary | ICD-10-CM | POA: Diagnosis not present

## 2017-11-30 DIAGNOSIS — L97929 Non-pressure chronic ulcer of unspecified part of left lower leg with unspecified severity: Secondary | ICD-10-CM | POA: Diagnosis not present

## 2017-12-03 DIAGNOSIS — M129 Arthropathy, unspecified: Secondary | ICD-10-CM | POA: Diagnosis not present

## 2017-12-03 DIAGNOSIS — I10 Essential (primary) hypertension: Secondary | ICD-10-CM | POA: Diagnosis not present

## 2017-12-03 DIAGNOSIS — L97929 Non-pressure chronic ulcer of unspecified part of left lower leg with unspecified severity: Secondary | ICD-10-CM | POA: Diagnosis not present

## 2017-12-03 DIAGNOSIS — S91301A Unspecified open wound, right foot, initial encounter: Secondary | ICD-10-CM | POA: Diagnosis not present

## 2017-12-03 DIAGNOSIS — E1140 Type 2 diabetes mellitus with diabetic neuropathy, unspecified: Secondary | ICD-10-CM | POA: Diagnosis not present

## 2017-12-07 DIAGNOSIS — L89513 Pressure ulcer of right ankle, stage 3: Secondary | ICD-10-CM | POA: Diagnosis not present

## 2017-12-07 DIAGNOSIS — Z4800 Encounter for change or removal of nonsurgical wound dressing: Secondary | ICD-10-CM | POA: Diagnosis not present

## 2017-12-07 DIAGNOSIS — E1142 Type 2 diabetes mellitus with diabetic polyneuropathy: Secondary | ICD-10-CM | POA: Diagnosis not present

## 2017-12-07 DIAGNOSIS — Z87440 Personal history of urinary (tract) infections: Secondary | ICD-10-CM | POA: Diagnosis not present

## 2017-12-07 DIAGNOSIS — I10 Essential (primary) hypertension: Secondary | ICD-10-CM | POA: Diagnosis not present

## 2017-12-10 DIAGNOSIS — Z4800 Encounter for change or removal of nonsurgical wound dressing: Secondary | ICD-10-CM | POA: Diagnosis not present

## 2017-12-10 DIAGNOSIS — I10 Essential (primary) hypertension: Secondary | ICD-10-CM | POA: Diagnosis not present

## 2017-12-10 DIAGNOSIS — E1142 Type 2 diabetes mellitus with diabetic polyneuropathy: Secondary | ICD-10-CM | POA: Diagnosis not present

## 2017-12-10 DIAGNOSIS — L89513 Pressure ulcer of right ankle, stage 3: Secondary | ICD-10-CM | POA: Diagnosis not present

## 2017-12-10 DIAGNOSIS — Z87440 Personal history of urinary (tract) infections: Secondary | ICD-10-CM | POA: Diagnosis not present

## 2017-12-14 DIAGNOSIS — L89513 Pressure ulcer of right ankle, stage 3: Secondary | ICD-10-CM | POA: Diagnosis not present

## 2017-12-14 DIAGNOSIS — E1142 Type 2 diabetes mellitus with diabetic polyneuropathy: Secondary | ICD-10-CM | POA: Diagnosis not present

## 2017-12-14 DIAGNOSIS — Z79899 Other long term (current) drug therapy: Secondary | ICD-10-CM | POA: Diagnosis not present

## 2017-12-14 DIAGNOSIS — I10 Essential (primary) hypertension: Secondary | ICD-10-CM | POA: Diagnosis not present

## 2017-12-14 DIAGNOSIS — Z87440 Personal history of urinary (tract) infections: Secondary | ICD-10-CM | POA: Diagnosis not present

## 2017-12-14 DIAGNOSIS — Z4800 Encounter for change or removal of nonsurgical wound dressing: Secondary | ICD-10-CM | POA: Diagnosis not present

## 2017-12-16 ENCOUNTER — Ambulatory Visit (INDEPENDENT_AMBULATORY_CARE_PROVIDER_SITE_OTHER): Payer: Medicare Other | Admitting: Podiatry

## 2017-12-16 ENCOUNTER — Ambulatory Visit (INDEPENDENT_AMBULATORY_CARE_PROVIDER_SITE_OTHER): Payer: Medicare Other

## 2017-12-16 DIAGNOSIS — E08621 Diabetes mellitus due to underlying condition with foot ulcer: Secondary | ICD-10-CM

## 2017-12-16 DIAGNOSIS — L97401 Non-pressure chronic ulcer of unspecified heel and midfoot limited to breakdown of skin: Secondary | ICD-10-CM

## 2017-12-17 DIAGNOSIS — L89513 Pressure ulcer of right ankle, stage 3: Secondary | ICD-10-CM | POA: Diagnosis not present

## 2017-12-17 DIAGNOSIS — Z4800 Encounter for change or removal of nonsurgical wound dressing: Secondary | ICD-10-CM | POA: Diagnosis not present

## 2017-12-17 DIAGNOSIS — I10 Essential (primary) hypertension: Secondary | ICD-10-CM | POA: Diagnosis not present

## 2017-12-17 DIAGNOSIS — E1142 Type 2 diabetes mellitus with diabetic polyneuropathy: Secondary | ICD-10-CM | POA: Diagnosis not present

## 2017-12-17 DIAGNOSIS — Z87440 Personal history of urinary (tract) infections: Secondary | ICD-10-CM | POA: Diagnosis not present

## 2017-12-18 DIAGNOSIS — E1142 Type 2 diabetes mellitus with diabetic polyneuropathy: Secondary | ICD-10-CM | POA: Diagnosis not present

## 2017-12-18 DIAGNOSIS — I10 Essential (primary) hypertension: Secondary | ICD-10-CM | POA: Diagnosis not present

## 2017-12-18 DIAGNOSIS — Z87440 Personal history of urinary (tract) infections: Secondary | ICD-10-CM | POA: Diagnosis not present

## 2017-12-18 DIAGNOSIS — Z4800 Encounter for change or removal of nonsurgical wound dressing: Secondary | ICD-10-CM | POA: Diagnosis not present

## 2017-12-18 DIAGNOSIS — L89513 Pressure ulcer of right ankle, stage 3: Secondary | ICD-10-CM | POA: Diagnosis not present

## 2017-12-19 DIAGNOSIS — E1142 Type 2 diabetes mellitus with diabetic polyneuropathy: Secondary | ICD-10-CM | POA: Diagnosis not present

## 2017-12-19 DIAGNOSIS — Z87440 Personal history of urinary (tract) infections: Secondary | ICD-10-CM | POA: Diagnosis not present

## 2017-12-19 DIAGNOSIS — Z4800 Encounter for change or removal of nonsurgical wound dressing: Secondary | ICD-10-CM | POA: Diagnosis not present

## 2017-12-19 DIAGNOSIS — I10 Essential (primary) hypertension: Secondary | ICD-10-CM | POA: Diagnosis not present

## 2017-12-19 DIAGNOSIS — L89513 Pressure ulcer of right ankle, stage 3: Secondary | ICD-10-CM | POA: Diagnosis not present

## 2017-12-20 DIAGNOSIS — E1142 Type 2 diabetes mellitus with diabetic polyneuropathy: Secondary | ICD-10-CM | POA: Diagnosis not present

## 2017-12-20 DIAGNOSIS — I10 Essential (primary) hypertension: Secondary | ICD-10-CM | POA: Diagnosis not present

## 2017-12-20 DIAGNOSIS — L89513 Pressure ulcer of right ankle, stage 3: Secondary | ICD-10-CM | POA: Diagnosis not present

## 2017-12-20 DIAGNOSIS — Z4800 Encounter for change or removal of nonsurgical wound dressing: Secondary | ICD-10-CM | POA: Diagnosis not present

## 2017-12-20 DIAGNOSIS — Z87440 Personal history of urinary (tract) infections: Secondary | ICD-10-CM | POA: Diagnosis not present

## 2017-12-21 DIAGNOSIS — Z4800 Encounter for change or removal of nonsurgical wound dressing: Secondary | ICD-10-CM | POA: Diagnosis not present

## 2017-12-21 DIAGNOSIS — L89513 Pressure ulcer of right ankle, stage 3: Secondary | ICD-10-CM | POA: Diagnosis not present

## 2017-12-21 DIAGNOSIS — I10 Essential (primary) hypertension: Secondary | ICD-10-CM | POA: Diagnosis not present

## 2017-12-21 DIAGNOSIS — Z87440 Personal history of urinary (tract) infections: Secondary | ICD-10-CM | POA: Diagnosis not present

## 2017-12-21 DIAGNOSIS — E1142 Type 2 diabetes mellitus with diabetic polyneuropathy: Secondary | ICD-10-CM | POA: Diagnosis not present

## 2017-12-22 DIAGNOSIS — Z4800 Encounter for change or removal of nonsurgical wound dressing: Secondary | ICD-10-CM | POA: Diagnosis not present

## 2017-12-22 DIAGNOSIS — Z87440 Personal history of urinary (tract) infections: Secondary | ICD-10-CM | POA: Diagnosis not present

## 2017-12-22 DIAGNOSIS — E1142 Type 2 diabetes mellitus with diabetic polyneuropathy: Secondary | ICD-10-CM | POA: Diagnosis not present

## 2017-12-22 DIAGNOSIS — L89513 Pressure ulcer of right ankle, stage 3: Secondary | ICD-10-CM | POA: Diagnosis not present

## 2017-12-22 DIAGNOSIS — I10 Essential (primary) hypertension: Secondary | ICD-10-CM | POA: Diagnosis not present

## 2017-12-23 ENCOUNTER — Ambulatory Visit (INDEPENDENT_AMBULATORY_CARE_PROVIDER_SITE_OTHER): Payer: Medicare Other | Admitting: Podiatry

## 2017-12-23 DIAGNOSIS — L97401 Non-pressure chronic ulcer of unspecified heel and midfoot limited to breakdown of skin: Secondary | ICD-10-CM

## 2017-12-23 DIAGNOSIS — E08621 Diabetes mellitus due to underlying condition with foot ulcer: Secondary | ICD-10-CM

## 2017-12-24 DIAGNOSIS — E1142 Type 2 diabetes mellitus with diabetic polyneuropathy: Secondary | ICD-10-CM | POA: Diagnosis not present

## 2017-12-24 DIAGNOSIS — I10 Essential (primary) hypertension: Secondary | ICD-10-CM | POA: Diagnosis not present

## 2017-12-24 DIAGNOSIS — Z4800 Encounter for change or removal of nonsurgical wound dressing: Secondary | ICD-10-CM | POA: Diagnosis not present

## 2017-12-24 DIAGNOSIS — Z87440 Personal history of urinary (tract) infections: Secondary | ICD-10-CM | POA: Diagnosis not present

## 2017-12-24 DIAGNOSIS — L89513 Pressure ulcer of right ankle, stage 3: Secondary | ICD-10-CM | POA: Diagnosis not present

## 2017-12-25 DIAGNOSIS — I10 Essential (primary) hypertension: Secondary | ICD-10-CM | POA: Diagnosis not present

## 2017-12-25 DIAGNOSIS — Z4800 Encounter for change or removal of nonsurgical wound dressing: Secondary | ICD-10-CM | POA: Diagnosis not present

## 2017-12-25 DIAGNOSIS — E1142 Type 2 diabetes mellitus with diabetic polyneuropathy: Secondary | ICD-10-CM | POA: Diagnosis not present

## 2017-12-25 DIAGNOSIS — Z87440 Personal history of urinary (tract) infections: Secondary | ICD-10-CM | POA: Diagnosis not present

## 2017-12-25 DIAGNOSIS — L89513 Pressure ulcer of right ankle, stage 3: Secondary | ICD-10-CM | POA: Diagnosis not present

## 2017-12-26 DIAGNOSIS — I10 Essential (primary) hypertension: Secondary | ICD-10-CM | POA: Diagnosis not present

## 2017-12-26 DIAGNOSIS — Z4800 Encounter for change or removal of nonsurgical wound dressing: Secondary | ICD-10-CM | POA: Diagnosis not present

## 2017-12-26 DIAGNOSIS — Z87440 Personal history of urinary (tract) infections: Secondary | ICD-10-CM | POA: Diagnosis not present

## 2017-12-26 DIAGNOSIS — L89513 Pressure ulcer of right ankle, stage 3: Secondary | ICD-10-CM | POA: Diagnosis not present

## 2017-12-26 DIAGNOSIS — E1142 Type 2 diabetes mellitus with diabetic polyneuropathy: Secondary | ICD-10-CM | POA: Diagnosis not present

## 2017-12-27 DIAGNOSIS — Z87440 Personal history of urinary (tract) infections: Secondary | ICD-10-CM | POA: Diagnosis not present

## 2017-12-27 DIAGNOSIS — E1142 Type 2 diabetes mellitus with diabetic polyneuropathy: Secondary | ICD-10-CM | POA: Diagnosis not present

## 2017-12-27 DIAGNOSIS — L89513 Pressure ulcer of right ankle, stage 3: Secondary | ICD-10-CM | POA: Diagnosis not present

## 2017-12-27 DIAGNOSIS — I10 Essential (primary) hypertension: Secondary | ICD-10-CM | POA: Diagnosis not present

## 2017-12-27 DIAGNOSIS — Z4800 Encounter for change or removal of nonsurgical wound dressing: Secondary | ICD-10-CM | POA: Diagnosis not present

## 2017-12-28 DIAGNOSIS — Z4800 Encounter for change or removal of nonsurgical wound dressing: Secondary | ICD-10-CM | POA: Diagnosis not present

## 2017-12-28 DIAGNOSIS — Z87440 Personal history of urinary (tract) infections: Secondary | ICD-10-CM | POA: Diagnosis not present

## 2017-12-28 DIAGNOSIS — I10 Essential (primary) hypertension: Secondary | ICD-10-CM | POA: Diagnosis not present

## 2017-12-28 DIAGNOSIS — L89513 Pressure ulcer of right ankle, stage 3: Secondary | ICD-10-CM | POA: Diagnosis not present

## 2017-12-28 DIAGNOSIS — E1142 Type 2 diabetes mellitus with diabetic polyneuropathy: Secondary | ICD-10-CM | POA: Diagnosis not present

## 2017-12-29 DIAGNOSIS — Z87440 Personal history of urinary (tract) infections: Secondary | ICD-10-CM | POA: Diagnosis not present

## 2017-12-29 DIAGNOSIS — L89513 Pressure ulcer of right ankle, stage 3: Secondary | ICD-10-CM | POA: Diagnosis not present

## 2017-12-29 DIAGNOSIS — I10 Essential (primary) hypertension: Secondary | ICD-10-CM | POA: Diagnosis not present

## 2017-12-29 DIAGNOSIS — E1142 Type 2 diabetes mellitus with diabetic polyneuropathy: Secondary | ICD-10-CM | POA: Diagnosis not present

## 2017-12-29 DIAGNOSIS — Z4800 Encounter for change or removal of nonsurgical wound dressing: Secondary | ICD-10-CM | POA: Diagnosis not present

## 2017-12-30 DIAGNOSIS — L89513 Pressure ulcer of right ankle, stage 3: Secondary | ICD-10-CM | POA: Diagnosis not present

## 2017-12-30 DIAGNOSIS — Z4800 Encounter for change or removal of nonsurgical wound dressing: Secondary | ICD-10-CM | POA: Diagnosis not present

## 2017-12-30 DIAGNOSIS — E1142 Type 2 diabetes mellitus with diabetic polyneuropathy: Secondary | ICD-10-CM | POA: Diagnosis not present

## 2017-12-30 DIAGNOSIS — Z87440 Personal history of urinary (tract) infections: Secondary | ICD-10-CM | POA: Diagnosis not present

## 2017-12-30 DIAGNOSIS — I10 Essential (primary) hypertension: Secondary | ICD-10-CM | POA: Diagnosis not present

## 2017-12-31 DIAGNOSIS — L97509 Non-pressure chronic ulcer of other part of unspecified foot with unspecified severity: Secondary | ICD-10-CM | POA: Diagnosis not present

## 2017-12-31 DIAGNOSIS — G894 Chronic pain syndrome: Secondary | ICD-10-CM | POA: Diagnosis not present

## 2017-12-31 DIAGNOSIS — I10 Essential (primary) hypertension: Secondary | ICD-10-CM | POA: Diagnosis not present

## 2017-12-31 DIAGNOSIS — Z87440 Personal history of urinary (tract) infections: Secondary | ICD-10-CM | POA: Diagnosis not present

## 2017-12-31 DIAGNOSIS — E1142 Type 2 diabetes mellitus with diabetic polyneuropathy: Secondary | ICD-10-CM | POA: Diagnosis not present

## 2017-12-31 DIAGNOSIS — Z4800 Encounter for change or removal of nonsurgical wound dressing: Secondary | ICD-10-CM | POA: Diagnosis not present

## 2017-12-31 DIAGNOSIS — R51 Headache: Secondary | ICD-10-CM | POA: Diagnosis not present

## 2017-12-31 DIAGNOSIS — M62838 Other muscle spasm: Secondary | ICD-10-CM | POA: Diagnosis not present

## 2017-12-31 DIAGNOSIS — L89513 Pressure ulcer of right ankle, stage 3: Secondary | ICD-10-CM | POA: Diagnosis not present

## 2018-01-01 DIAGNOSIS — Z87440 Personal history of urinary (tract) infections: Secondary | ICD-10-CM | POA: Diagnosis not present

## 2018-01-01 DIAGNOSIS — Z4800 Encounter for change or removal of nonsurgical wound dressing: Secondary | ICD-10-CM | POA: Diagnosis not present

## 2018-01-01 DIAGNOSIS — E1142 Type 2 diabetes mellitus with diabetic polyneuropathy: Secondary | ICD-10-CM | POA: Diagnosis not present

## 2018-01-01 DIAGNOSIS — L89513 Pressure ulcer of right ankle, stage 3: Secondary | ICD-10-CM | POA: Diagnosis not present

## 2018-01-01 DIAGNOSIS — I10 Essential (primary) hypertension: Secondary | ICD-10-CM | POA: Diagnosis not present

## 2018-01-02 DIAGNOSIS — Z87440 Personal history of urinary (tract) infections: Secondary | ICD-10-CM | POA: Diagnosis not present

## 2018-01-02 DIAGNOSIS — L89513 Pressure ulcer of right ankle, stage 3: Secondary | ICD-10-CM | POA: Diagnosis not present

## 2018-01-02 DIAGNOSIS — Z4800 Encounter for change or removal of nonsurgical wound dressing: Secondary | ICD-10-CM | POA: Diagnosis not present

## 2018-01-02 DIAGNOSIS — E1142 Type 2 diabetes mellitus with diabetic polyneuropathy: Secondary | ICD-10-CM | POA: Diagnosis not present

## 2018-01-02 DIAGNOSIS — I10 Essential (primary) hypertension: Secondary | ICD-10-CM | POA: Diagnosis not present

## 2018-01-03 DIAGNOSIS — L89513 Pressure ulcer of right ankle, stage 3: Secondary | ICD-10-CM | POA: Diagnosis not present

## 2018-01-03 DIAGNOSIS — Z4800 Encounter for change or removal of nonsurgical wound dressing: Secondary | ICD-10-CM | POA: Diagnosis not present

## 2018-01-03 DIAGNOSIS — Z87440 Personal history of urinary (tract) infections: Secondary | ICD-10-CM | POA: Diagnosis not present

## 2018-01-03 DIAGNOSIS — I10 Essential (primary) hypertension: Secondary | ICD-10-CM | POA: Diagnosis not present

## 2018-01-03 DIAGNOSIS — E1142 Type 2 diabetes mellitus with diabetic polyneuropathy: Secondary | ICD-10-CM | POA: Diagnosis not present

## 2018-01-03 NOTE — Progress Notes (Signed)
  Subjective:  Patient ID: Stephanie Olson, female    DOB: March 23, 1941,  MRN: [id number]  No chief complaint on file.  77 y.o. female returns for wound care. No new complaints. Wound doing well.  Objective:  There were no vitals filed for this visit. General AA&O x3. Normal mood and affect.  Vascular Foot warm to touch.  Neurologic Sensation grossly diminished.  Dermatologic (Wound) Wound Location: R ankle lateral Wound Measurement: 3x1 Wound Base: granular Peri-wound: Normal Exudate: None: wound tissue dry  Orthopedic: No pain to palpation either foot.   Assessment & Plan:  Patient was evaluated and treated and all questions answered.  Ulcer R lateral ankle -Debridement as below. -Dressed with silvadene, DSD.  Procedure: Selective Debridement of Wound Rationale: Removal of devitalized tissue from the wound to promote healing.  Pre-Debridement Wound Measurements: 3 cm x 1 cm x 0.1 cm  Post-Debridement Wound Measurements: same as pre-debridement. Type of Debridement: Selective Tissue Removed: Devitalized soft-tissue Instrumentation: 3-0 mm dermal curette Dressing: Dry, sterile, compression dressing. Disposition: Patient tolerated procedure well. Patient to return in 1 week for follow-up.   Return in about 1 week (around 12/30/2017) for Wound Care.

## 2018-01-03 NOTE — Progress Notes (Signed)
  Subjective:  Patient ID: Stephanie Olson, female    DOB: [birth year],  MRN: [id number]  Chief Complaint  Patient presents with  . Foot Ulcer    right lateral ankle and heel - has been soaking in epsom salts and was told to stop   77 y.o. female returns for wound care. Believes the wound to be doing ok.. Denies N/V/F/Ch.  Objective:  There were no vitals filed for this visit. General AA&O x3. Normal mood and affect.  Vascular Foot warm to touch.  Neurologic Sensation grossly diminished.  Dermatologic (Wound) Wound Location: R ankle lateral Wound Measurement: 3x1 Wound Base: mixed fibrotic/granular Peri-wound: Normal Exudate: None: wound tissue dry   Orthopedic: No pain to palpation either foot.   Assessment & Plan:  Patient was evaluated and treated and all questions answered.  Ulcer R lateral ankle -Debridement as below. -Dressed with silvadene, DSD.  Procedure: Excisional Debridement of Wound Rationale: Removal of non-viable soft tissue from the wound to promote healing.  Anesthesia: topical lidocaine cream Pre-Debridement Wound Measurements: 3 cm x 1 cm x 0.1 cm  Post-Debridement Wound Measurements: 3 cm x 1 cm x 0.1 cm  Type of Debridement: Excisional Tissue Removed: Non-viable soft tissue Depth of Debridement: subq Instrumentation: 3-0 mm dermal curette Technique: Sharp excisional debridement to bleeding, viable wound base.  Dressing: Dry, sterile, compression dressing. Disposition: Patient tolerated procedure well. Patient to return in 1 week for follow-up.  Return in about 1 week (around 12/23/2017).

## 2018-01-04 DIAGNOSIS — Z87440 Personal history of urinary (tract) infections: Secondary | ICD-10-CM | POA: Diagnosis not present

## 2018-01-04 DIAGNOSIS — E1142 Type 2 diabetes mellitus with diabetic polyneuropathy: Secondary | ICD-10-CM | POA: Diagnosis not present

## 2018-01-04 DIAGNOSIS — L89513 Pressure ulcer of right ankle, stage 3: Secondary | ICD-10-CM | POA: Diagnosis not present

## 2018-01-04 DIAGNOSIS — I10 Essential (primary) hypertension: Secondary | ICD-10-CM | POA: Diagnosis not present

## 2018-01-04 DIAGNOSIS — Z4800 Encounter for change or removal of nonsurgical wound dressing: Secondary | ICD-10-CM | POA: Diagnosis not present

## 2018-01-05 DIAGNOSIS — I10 Essential (primary) hypertension: Secondary | ICD-10-CM | POA: Diagnosis not present

## 2018-01-05 DIAGNOSIS — Z4800 Encounter for change or removal of nonsurgical wound dressing: Secondary | ICD-10-CM | POA: Diagnosis not present

## 2018-01-05 DIAGNOSIS — Z87440 Personal history of urinary (tract) infections: Secondary | ICD-10-CM | POA: Diagnosis not present

## 2018-01-05 DIAGNOSIS — L89513 Pressure ulcer of right ankle, stage 3: Secondary | ICD-10-CM | POA: Diagnosis not present

## 2018-01-05 DIAGNOSIS — Z993 Dependence on wheelchair: Secondary | ICD-10-CM | POA: Diagnosis not present

## 2018-01-05 DIAGNOSIS — E1142 Type 2 diabetes mellitus with diabetic polyneuropathy: Secondary | ICD-10-CM | POA: Diagnosis not present

## 2018-01-06 ENCOUNTER — Ambulatory Visit (INDEPENDENT_AMBULATORY_CARE_PROVIDER_SITE_OTHER): Payer: Medicare Other | Admitting: Podiatry

## 2018-01-06 DIAGNOSIS — E08621 Diabetes mellitus due to underlying condition with foot ulcer: Secondary | ICD-10-CM | POA: Diagnosis not present

## 2018-01-06 DIAGNOSIS — L97401 Non-pressure chronic ulcer of unspecified heel and midfoot limited to breakdown of skin: Secondary | ICD-10-CM

## 2018-01-06 NOTE — Progress Notes (Signed)
  Subjective:  Patient ID: Stephanie Olson, female    DOB: 1941-02-06,  MRN: [id number]  Chief Complaint  Patient presents with  . Diabetic Ulcer        77 y.o. female returns for wound care.  Believes the wound to be doing okay still receiving Santyl therapy from her facility  Objective:  There were no vitals filed for this visit. General AA&O x3. Normal mood and affect.  Vascular Foot warm to touch.  Neurologic Sensation grossly diminished.  Dermatologic (Wound) Wound Location: R ankle lateral Wound Measurement: 2x1 Wound Base: granular Peri-wound: Normal Exudate: None: wound tissue dry   Orthopedic: No pain to palpation either foot.   Assessment & Plan:  Patient was evaluated and treated and all questions answered.  Ulcer R lateral ankle -No debridement today  -Wound dressed with medihoney and DSD.   -Continue Santyl.  Wound appears to be improving with this regimen  Return in about 2 weeks (around 01/20/2018) for Wound Care.

## 2018-01-07 DIAGNOSIS — Z4800 Encounter for change or removal of nonsurgical wound dressing: Secondary | ICD-10-CM | POA: Diagnosis not present

## 2018-01-07 DIAGNOSIS — L89513 Pressure ulcer of right ankle, stage 3: Secondary | ICD-10-CM | POA: Diagnosis not present

## 2018-01-07 DIAGNOSIS — M79671 Pain in right foot: Secondary | ICD-10-CM | POA: Diagnosis not present

## 2018-01-07 DIAGNOSIS — I10 Essential (primary) hypertension: Secondary | ICD-10-CM | POA: Diagnosis not present

## 2018-01-07 DIAGNOSIS — Z993 Dependence on wheelchair: Secondary | ICD-10-CM | POA: Diagnosis not present

## 2018-01-07 DIAGNOSIS — G894 Chronic pain syndrome: Secondary | ICD-10-CM | POA: Diagnosis not present

## 2018-01-07 DIAGNOSIS — E1142 Type 2 diabetes mellitus with diabetic polyneuropathy: Secondary | ICD-10-CM | POA: Diagnosis not present

## 2018-01-07 DIAGNOSIS — Z87440 Personal history of urinary (tract) infections: Secondary | ICD-10-CM | POA: Diagnosis not present

## 2018-01-08 DIAGNOSIS — E1142 Type 2 diabetes mellitus with diabetic polyneuropathy: Secondary | ICD-10-CM | POA: Diagnosis not present

## 2018-01-08 DIAGNOSIS — Z87440 Personal history of urinary (tract) infections: Secondary | ICD-10-CM | POA: Diagnosis not present

## 2018-01-08 DIAGNOSIS — Z4800 Encounter for change or removal of nonsurgical wound dressing: Secondary | ICD-10-CM | POA: Diagnosis not present

## 2018-01-08 DIAGNOSIS — L89513 Pressure ulcer of right ankle, stage 3: Secondary | ICD-10-CM | POA: Diagnosis not present

## 2018-01-08 DIAGNOSIS — Z993 Dependence on wheelchair: Secondary | ICD-10-CM | POA: Diagnosis not present

## 2018-01-08 DIAGNOSIS — I10 Essential (primary) hypertension: Secondary | ICD-10-CM | POA: Diagnosis not present

## 2018-01-09 DIAGNOSIS — E1142 Type 2 diabetes mellitus with diabetic polyneuropathy: Secondary | ICD-10-CM | POA: Diagnosis not present

## 2018-01-09 DIAGNOSIS — L89513 Pressure ulcer of right ankle, stage 3: Secondary | ICD-10-CM | POA: Diagnosis not present

## 2018-01-09 DIAGNOSIS — Z87440 Personal history of urinary (tract) infections: Secondary | ICD-10-CM | POA: Diagnosis not present

## 2018-01-09 DIAGNOSIS — Z993 Dependence on wheelchair: Secondary | ICD-10-CM | POA: Diagnosis not present

## 2018-01-09 DIAGNOSIS — I10 Essential (primary) hypertension: Secondary | ICD-10-CM | POA: Diagnosis not present

## 2018-01-09 DIAGNOSIS — Z4800 Encounter for change or removal of nonsurgical wound dressing: Secondary | ICD-10-CM | POA: Diagnosis not present

## 2018-01-10 DIAGNOSIS — Z4800 Encounter for change or removal of nonsurgical wound dressing: Secondary | ICD-10-CM | POA: Diagnosis not present

## 2018-01-10 DIAGNOSIS — Z993 Dependence on wheelchair: Secondary | ICD-10-CM | POA: Diagnosis not present

## 2018-01-10 DIAGNOSIS — E1142 Type 2 diabetes mellitus with diabetic polyneuropathy: Secondary | ICD-10-CM | POA: Diagnosis not present

## 2018-01-10 DIAGNOSIS — I10 Essential (primary) hypertension: Secondary | ICD-10-CM | POA: Diagnosis not present

## 2018-01-10 DIAGNOSIS — L89513 Pressure ulcer of right ankle, stage 3: Secondary | ICD-10-CM | POA: Diagnosis not present

## 2018-01-10 DIAGNOSIS — Z87440 Personal history of urinary (tract) infections: Secondary | ICD-10-CM | POA: Diagnosis not present

## 2018-01-12 DIAGNOSIS — Z993 Dependence on wheelchair: Secondary | ICD-10-CM | POA: Diagnosis not present

## 2018-01-12 DIAGNOSIS — Z4800 Encounter for change or removal of nonsurgical wound dressing: Secondary | ICD-10-CM | POA: Diagnosis not present

## 2018-01-12 DIAGNOSIS — Z87440 Personal history of urinary (tract) infections: Secondary | ICD-10-CM | POA: Diagnosis not present

## 2018-01-12 DIAGNOSIS — I10 Essential (primary) hypertension: Secondary | ICD-10-CM | POA: Diagnosis not present

## 2018-01-12 DIAGNOSIS — E1142 Type 2 diabetes mellitus with diabetic polyneuropathy: Secondary | ICD-10-CM | POA: Diagnosis not present

## 2018-01-12 DIAGNOSIS — L89513 Pressure ulcer of right ankle, stage 3: Secondary | ICD-10-CM | POA: Diagnosis not present

## 2018-01-14 DIAGNOSIS — Z87440 Personal history of urinary (tract) infections: Secondary | ICD-10-CM | POA: Diagnosis not present

## 2018-01-14 DIAGNOSIS — Z993 Dependence on wheelchair: Secondary | ICD-10-CM | POA: Diagnosis not present

## 2018-01-14 DIAGNOSIS — E1142 Type 2 diabetes mellitus with diabetic polyneuropathy: Secondary | ICD-10-CM | POA: Diagnosis not present

## 2018-01-14 DIAGNOSIS — L89513 Pressure ulcer of right ankle, stage 3: Secondary | ICD-10-CM | POA: Diagnosis not present

## 2018-01-14 DIAGNOSIS — Z4800 Encounter for change or removal of nonsurgical wound dressing: Secondary | ICD-10-CM | POA: Diagnosis not present

## 2018-01-14 DIAGNOSIS — I10 Essential (primary) hypertension: Secondary | ICD-10-CM | POA: Diagnosis not present

## 2018-01-17 DIAGNOSIS — Z4800 Encounter for change or removal of nonsurgical wound dressing: Secondary | ICD-10-CM | POA: Diagnosis not present

## 2018-01-17 DIAGNOSIS — E1142 Type 2 diabetes mellitus with diabetic polyneuropathy: Secondary | ICD-10-CM | POA: Diagnosis not present

## 2018-01-17 DIAGNOSIS — L89513 Pressure ulcer of right ankle, stage 3: Secondary | ICD-10-CM | POA: Diagnosis not present

## 2018-01-17 DIAGNOSIS — Z87440 Personal history of urinary (tract) infections: Secondary | ICD-10-CM | POA: Diagnosis not present

## 2018-01-17 DIAGNOSIS — I10 Essential (primary) hypertension: Secondary | ICD-10-CM | POA: Diagnosis not present

## 2018-01-17 DIAGNOSIS — Z993 Dependence on wheelchair: Secondary | ICD-10-CM | POA: Diagnosis not present

## 2018-01-19 DIAGNOSIS — L89513 Pressure ulcer of right ankle, stage 3: Secondary | ICD-10-CM | POA: Diagnosis not present

## 2018-01-19 DIAGNOSIS — I10 Essential (primary) hypertension: Secondary | ICD-10-CM | POA: Diagnosis not present

## 2018-01-19 DIAGNOSIS — Z4800 Encounter for change or removal of nonsurgical wound dressing: Secondary | ICD-10-CM | POA: Diagnosis not present

## 2018-01-19 DIAGNOSIS — Z993 Dependence on wheelchair: Secondary | ICD-10-CM | POA: Diagnosis not present

## 2018-01-19 DIAGNOSIS — Z87440 Personal history of urinary (tract) infections: Secondary | ICD-10-CM | POA: Diagnosis not present

## 2018-01-19 DIAGNOSIS — E1142 Type 2 diabetes mellitus with diabetic polyneuropathy: Secondary | ICD-10-CM | POA: Diagnosis not present

## 2018-01-20 ENCOUNTER — Ambulatory Visit: Payer: Medicare Other | Admitting: Podiatry

## 2018-01-21 DIAGNOSIS — Z4800 Encounter for change or removal of nonsurgical wound dressing: Secondary | ICD-10-CM | POA: Diagnosis not present

## 2018-01-21 DIAGNOSIS — Z993 Dependence on wheelchair: Secondary | ICD-10-CM | POA: Diagnosis not present

## 2018-01-21 DIAGNOSIS — Z87440 Personal history of urinary (tract) infections: Secondary | ICD-10-CM | POA: Diagnosis not present

## 2018-01-21 DIAGNOSIS — I10 Essential (primary) hypertension: Secondary | ICD-10-CM | POA: Diagnosis not present

## 2018-01-21 DIAGNOSIS — E1142 Type 2 diabetes mellitus with diabetic polyneuropathy: Secondary | ICD-10-CM | POA: Diagnosis not present

## 2018-01-21 DIAGNOSIS — L89513 Pressure ulcer of right ankle, stage 3: Secondary | ICD-10-CM | POA: Diagnosis not present

## 2018-01-24 DIAGNOSIS — I10 Essential (primary) hypertension: Secondary | ICD-10-CM | POA: Diagnosis not present

## 2018-01-24 DIAGNOSIS — L89513 Pressure ulcer of right ankle, stage 3: Secondary | ICD-10-CM | POA: Diagnosis not present

## 2018-01-24 DIAGNOSIS — Z4800 Encounter for change or removal of nonsurgical wound dressing: Secondary | ICD-10-CM | POA: Diagnosis not present

## 2018-01-24 DIAGNOSIS — Z993 Dependence on wheelchair: Secondary | ICD-10-CM | POA: Diagnosis not present

## 2018-01-24 DIAGNOSIS — E1142 Type 2 diabetes mellitus with diabetic polyneuropathy: Secondary | ICD-10-CM | POA: Diagnosis not present

## 2018-01-24 DIAGNOSIS — Z87440 Personal history of urinary (tract) infections: Secondary | ICD-10-CM | POA: Diagnosis not present

## 2018-01-26 DIAGNOSIS — I10 Essential (primary) hypertension: Secondary | ICD-10-CM | POA: Diagnosis not present

## 2018-01-26 DIAGNOSIS — Z87440 Personal history of urinary (tract) infections: Secondary | ICD-10-CM | POA: Diagnosis not present

## 2018-01-26 DIAGNOSIS — L89513 Pressure ulcer of right ankle, stage 3: Secondary | ICD-10-CM | POA: Diagnosis not present

## 2018-01-26 DIAGNOSIS — Z993 Dependence on wheelchair: Secondary | ICD-10-CM | POA: Diagnosis not present

## 2018-01-26 DIAGNOSIS — E1142 Type 2 diabetes mellitus with diabetic polyneuropathy: Secondary | ICD-10-CM | POA: Diagnosis not present

## 2018-01-26 DIAGNOSIS — Z4800 Encounter for change or removal of nonsurgical wound dressing: Secondary | ICD-10-CM | POA: Diagnosis not present

## 2018-01-28 DIAGNOSIS — E119 Type 2 diabetes mellitus without complications: Secondary | ICD-10-CM | POA: Diagnosis not present

## 2018-01-28 DIAGNOSIS — Z87440 Personal history of urinary (tract) infections: Secondary | ICD-10-CM | POA: Diagnosis not present

## 2018-01-28 DIAGNOSIS — Z993 Dependence on wheelchair: Secondary | ICD-10-CM | POA: Diagnosis not present

## 2018-01-28 DIAGNOSIS — K5900 Constipation, unspecified: Secondary | ICD-10-CM | POA: Diagnosis not present

## 2018-01-28 DIAGNOSIS — I10 Essential (primary) hypertension: Secondary | ICD-10-CM | POA: Diagnosis not present

## 2018-01-28 DIAGNOSIS — E1142 Type 2 diabetes mellitus with diabetic polyneuropathy: Secondary | ICD-10-CM | POA: Diagnosis not present

## 2018-01-28 DIAGNOSIS — G894 Chronic pain syndrome: Secondary | ICD-10-CM | POA: Diagnosis not present

## 2018-01-28 DIAGNOSIS — R112 Nausea with vomiting, unspecified: Secondary | ICD-10-CM | POA: Diagnosis not present

## 2018-01-28 DIAGNOSIS — Z4800 Encounter for change or removal of nonsurgical wound dressing: Secondary | ICD-10-CM | POA: Diagnosis not present

## 2018-01-28 DIAGNOSIS — R51 Headache: Secondary | ICD-10-CM | POA: Diagnosis not present

## 2018-01-28 DIAGNOSIS — L89513 Pressure ulcer of right ankle, stage 3: Secondary | ICD-10-CM | POA: Diagnosis not present

## 2018-01-30 DIAGNOSIS — E1142 Type 2 diabetes mellitus with diabetic polyneuropathy: Secondary | ICD-10-CM | POA: Diagnosis not present

## 2018-01-30 DIAGNOSIS — I10 Essential (primary) hypertension: Secondary | ICD-10-CM | POA: Diagnosis not present

## 2018-01-30 DIAGNOSIS — L89513 Pressure ulcer of right ankle, stage 3: Secondary | ICD-10-CM | POA: Diagnosis not present

## 2018-01-30 DIAGNOSIS — Z993 Dependence on wheelchair: Secondary | ICD-10-CM | POA: Diagnosis not present

## 2018-01-30 DIAGNOSIS — Z87440 Personal history of urinary (tract) infections: Secondary | ICD-10-CM | POA: Diagnosis not present

## 2018-01-30 DIAGNOSIS — Z4800 Encounter for change or removal of nonsurgical wound dressing: Secondary | ICD-10-CM | POA: Diagnosis not present

## 2018-02-02 DIAGNOSIS — Z4800 Encounter for change or removal of nonsurgical wound dressing: Secondary | ICD-10-CM | POA: Diagnosis not present

## 2018-02-02 DIAGNOSIS — Z993 Dependence on wheelchair: Secondary | ICD-10-CM | POA: Diagnosis not present

## 2018-02-02 DIAGNOSIS — L89513 Pressure ulcer of right ankle, stage 3: Secondary | ICD-10-CM | POA: Diagnosis not present

## 2018-02-02 DIAGNOSIS — I10 Essential (primary) hypertension: Secondary | ICD-10-CM | POA: Diagnosis not present

## 2018-02-02 DIAGNOSIS — Z87440 Personal history of urinary (tract) infections: Secondary | ICD-10-CM | POA: Diagnosis not present

## 2018-02-02 DIAGNOSIS — E1142 Type 2 diabetes mellitus with diabetic polyneuropathy: Secondary | ICD-10-CM | POA: Diagnosis not present

## 2018-02-04 DIAGNOSIS — R112 Nausea with vomiting, unspecified: Secondary | ICD-10-CM | POA: Diagnosis not present

## 2018-02-04 DIAGNOSIS — K5900 Constipation, unspecified: Secondary | ICD-10-CM | POA: Diagnosis not present

## 2018-02-04 DIAGNOSIS — Z993 Dependence on wheelchair: Secondary | ICD-10-CM | POA: Diagnosis not present

## 2018-02-04 DIAGNOSIS — R51 Headache: Secondary | ICD-10-CM | POA: Diagnosis not present

## 2018-02-04 DIAGNOSIS — E119 Type 2 diabetes mellitus without complications: Secondary | ICD-10-CM | POA: Diagnosis not present

## 2018-02-04 DIAGNOSIS — Z4800 Encounter for change or removal of nonsurgical wound dressing: Secondary | ICD-10-CM | POA: Diagnosis not present

## 2018-02-04 DIAGNOSIS — E1142 Type 2 diabetes mellitus with diabetic polyneuropathy: Secondary | ICD-10-CM | POA: Diagnosis not present

## 2018-02-04 DIAGNOSIS — I10 Essential (primary) hypertension: Secondary | ICD-10-CM | POA: Diagnosis not present

## 2018-02-04 DIAGNOSIS — L89513 Pressure ulcer of right ankle, stage 3: Secondary | ICD-10-CM | POA: Diagnosis not present

## 2018-02-04 DIAGNOSIS — Z87440 Personal history of urinary (tract) infections: Secondary | ICD-10-CM | POA: Diagnosis not present

## 2018-02-04 DIAGNOSIS — G894 Chronic pain syndrome: Secondary | ICD-10-CM | POA: Diagnosis not present

## 2018-02-07 DIAGNOSIS — I10 Essential (primary) hypertension: Secondary | ICD-10-CM | POA: Diagnosis not present

## 2018-02-07 DIAGNOSIS — Z87440 Personal history of urinary (tract) infections: Secondary | ICD-10-CM | POA: Diagnosis not present

## 2018-02-07 DIAGNOSIS — Z4800 Encounter for change or removal of nonsurgical wound dressing: Secondary | ICD-10-CM | POA: Diagnosis not present

## 2018-02-07 DIAGNOSIS — L89513 Pressure ulcer of right ankle, stage 3: Secondary | ICD-10-CM | POA: Diagnosis not present

## 2018-02-07 DIAGNOSIS — E1142 Type 2 diabetes mellitus with diabetic polyneuropathy: Secondary | ICD-10-CM | POA: Diagnosis not present

## 2018-02-07 DIAGNOSIS — Z993 Dependence on wheelchair: Secondary | ICD-10-CM | POA: Diagnosis not present

## 2018-02-09 DIAGNOSIS — E1142 Type 2 diabetes mellitus with diabetic polyneuropathy: Secondary | ICD-10-CM | POA: Diagnosis not present

## 2018-02-09 DIAGNOSIS — Z87440 Personal history of urinary (tract) infections: Secondary | ICD-10-CM | POA: Diagnosis not present

## 2018-02-09 DIAGNOSIS — Z4800 Encounter for change or removal of nonsurgical wound dressing: Secondary | ICD-10-CM | POA: Diagnosis not present

## 2018-02-09 DIAGNOSIS — L89513 Pressure ulcer of right ankle, stage 3: Secondary | ICD-10-CM | POA: Diagnosis not present

## 2018-02-09 DIAGNOSIS — I10 Essential (primary) hypertension: Secondary | ICD-10-CM | POA: Diagnosis not present

## 2018-02-09 DIAGNOSIS — Z993 Dependence on wheelchair: Secondary | ICD-10-CM | POA: Diagnosis not present

## 2018-02-11 DIAGNOSIS — I10 Essential (primary) hypertension: Secondary | ICD-10-CM | POA: Diagnosis not present

## 2018-02-11 DIAGNOSIS — L89513 Pressure ulcer of right ankle, stage 3: Secondary | ICD-10-CM | POA: Diagnosis not present

## 2018-02-11 DIAGNOSIS — Z4800 Encounter for change or removal of nonsurgical wound dressing: Secondary | ICD-10-CM | POA: Diagnosis not present

## 2018-02-11 DIAGNOSIS — Z993 Dependence on wheelchair: Secondary | ICD-10-CM | POA: Diagnosis not present

## 2018-02-11 DIAGNOSIS — E1142 Type 2 diabetes mellitus with diabetic polyneuropathy: Secondary | ICD-10-CM | POA: Diagnosis not present

## 2018-02-11 DIAGNOSIS — Z87440 Personal history of urinary (tract) infections: Secondary | ICD-10-CM | POA: Diagnosis not present

## 2018-02-14 DIAGNOSIS — L89513 Pressure ulcer of right ankle, stage 3: Secondary | ICD-10-CM | POA: Diagnosis not present

## 2018-02-14 DIAGNOSIS — Z993 Dependence on wheelchair: Secondary | ICD-10-CM | POA: Diagnosis not present

## 2018-02-14 DIAGNOSIS — B351 Tinea unguium: Secondary | ICD-10-CM | POA: Diagnosis not present

## 2018-02-14 DIAGNOSIS — E119 Type 2 diabetes mellitus without complications: Secondary | ICD-10-CM | POA: Diagnosis not present

## 2018-02-14 DIAGNOSIS — Z4800 Encounter for change or removal of nonsurgical wound dressing: Secondary | ICD-10-CM | POA: Diagnosis not present

## 2018-02-14 DIAGNOSIS — I10 Essential (primary) hypertension: Secondary | ICD-10-CM | POA: Diagnosis not present

## 2018-02-14 DIAGNOSIS — I739 Peripheral vascular disease, unspecified: Secondary | ICD-10-CM | POA: Diagnosis not present

## 2018-02-14 DIAGNOSIS — E1142 Type 2 diabetes mellitus with diabetic polyneuropathy: Secondary | ICD-10-CM | POA: Diagnosis not present

## 2018-02-14 DIAGNOSIS — Z87440 Personal history of urinary (tract) infections: Secondary | ICD-10-CM | POA: Diagnosis not present

## 2018-02-14 DIAGNOSIS — L603 Nail dystrophy: Secondary | ICD-10-CM | POA: Diagnosis not present

## 2018-02-16 DIAGNOSIS — Z87440 Personal history of urinary (tract) infections: Secondary | ICD-10-CM | POA: Diagnosis not present

## 2018-02-16 DIAGNOSIS — I10 Essential (primary) hypertension: Secondary | ICD-10-CM | POA: Diagnosis not present

## 2018-02-16 DIAGNOSIS — L89513 Pressure ulcer of right ankle, stage 3: Secondary | ICD-10-CM | POA: Diagnosis not present

## 2018-02-16 DIAGNOSIS — Z4800 Encounter for change or removal of nonsurgical wound dressing: Secondary | ICD-10-CM | POA: Diagnosis not present

## 2018-02-16 DIAGNOSIS — Z993 Dependence on wheelchair: Secondary | ICD-10-CM | POA: Diagnosis not present

## 2018-02-16 DIAGNOSIS — E1142 Type 2 diabetes mellitus with diabetic polyneuropathy: Secondary | ICD-10-CM | POA: Diagnosis not present

## 2018-02-18 DIAGNOSIS — Z993 Dependence on wheelchair: Secondary | ICD-10-CM | POA: Diagnosis not present

## 2018-02-18 DIAGNOSIS — L89513 Pressure ulcer of right ankle, stage 3: Secondary | ICD-10-CM | POA: Diagnosis not present

## 2018-02-18 DIAGNOSIS — E1142 Type 2 diabetes mellitus with diabetic polyneuropathy: Secondary | ICD-10-CM | POA: Diagnosis not present

## 2018-02-18 DIAGNOSIS — I10 Essential (primary) hypertension: Secondary | ICD-10-CM | POA: Diagnosis not present

## 2018-02-18 DIAGNOSIS — Z87440 Personal history of urinary (tract) infections: Secondary | ICD-10-CM | POA: Diagnosis not present

## 2018-02-18 DIAGNOSIS — Z4800 Encounter for change or removal of nonsurgical wound dressing: Secondary | ICD-10-CM | POA: Diagnosis not present

## 2018-02-21 DIAGNOSIS — E1142 Type 2 diabetes mellitus with diabetic polyneuropathy: Secondary | ICD-10-CM | POA: Diagnosis not present

## 2018-02-21 DIAGNOSIS — Z993 Dependence on wheelchair: Secondary | ICD-10-CM | POA: Diagnosis not present

## 2018-02-21 DIAGNOSIS — L89513 Pressure ulcer of right ankle, stage 3: Secondary | ICD-10-CM | POA: Diagnosis not present

## 2018-02-21 DIAGNOSIS — Z4800 Encounter for change or removal of nonsurgical wound dressing: Secondary | ICD-10-CM | POA: Diagnosis not present

## 2018-02-21 DIAGNOSIS — Z87440 Personal history of urinary (tract) infections: Secondary | ICD-10-CM | POA: Diagnosis not present

## 2018-02-21 DIAGNOSIS — I10 Essential (primary) hypertension: Secondary | ICD-10-CM | POA: Diagnosis not present

## 2018-02-23 DIAGNOSIS — Z87440 Personal history of urinary (tract) infections: Secondary | ICD-10-CM | POA: Diagnosis not present

## 2018-02-23 DIAGNOSIS — I10 Essential (primary) hypertension: Secondary | ICD-10-CM | POA: Diagnosis not present

## 2018-02-23 DIAGNOSIS — Z993 Dependence on wheelchair: Secondary | ICD-10-CM | POA: Diagnosis not present

## 2018-02-23 DIAGNOSIS — L89513 Pressure ulcer of right ankle, stage 3: Secondary | ICD-10-CM | POA: Diagnosis not present

## 2018-02-23 DIAGNOSIS — Z4800 Encounter for change or removal of nonsurgical wound dressing: Secondary | ICD-10-CM | POA: Diagnosis not present

## 2018-02-23 DIAGNOSIS — E119 Type 2 diabetes mellitus without complications: Secondary | ICD-10-CM | POA: Diagnosis not present

## 2018-02-23 DIAGNOSIS — E1142 Type 2 diabetes mellitus with diabetic polyneuropathy: Secondary | ICD-10-CM | POA: Diagnosis not present

## 2018-02-25 DIAGNOSIS — L89513 Pressure ulcer of right ankle, stage 3: Secondary | ICD-10-CM | POA: Diagnosis not present

## 2018-02-25 DIAGNOSIS — Z993 Dependence on wheelchair: Secondary | ICD-10-CM | POA: Diagnosis not present

## 2018-02-25 DIAGNOSIS — I10 Essential (primary) hypertension: Secondary | ICD-10-CM | POA: Diagnosis not present

## 2018-02-25 DIAGNOSIS — Z4800 Encounter for change or removal of nonsurgical wound dressing: Secondary | ICD-10-CM | POA: Diagnosis not present

## 2018-02-25 DIAGNOSIS — Z87440 Personal history of urinary (tract) infections: Secondary | ICD-10-CM | POA: Diagnosis not present

## 2018-02-25 DIAGNOSIS — K5900 Constipation, unspecified: Secondary | ICD-10-CM | POA: Diagnosis not present

## 2018-02-25 DIAGNOSIS — R51 Headache: Secondary | ICD-10-CM | POA: Diagnosis not present

## 2018-02-25 DIAGNOSIS — M62838 Other muscle spasm: Secondary | ICD-10-CM | POA: Diagnosis not present

## 2018-02-25 DIAGNOSIS — E1142 Type 2 diabetes mellitus with diabetic polyneuropathy: Secondary | ICD-10-CM | POA: Diagnosis not present

## 2018-02-25 DIAGNOSIS — R112 Nausea with vomiting, unspecified: Secondary | ICD-10-CM | POA: Diagnosis not present

## 2018-02-28 DIAGNOSIS — Z87440 Personal history of urinary (tract) infections: Secondary | ICD-10-CM | POA: Diagnosis not present

## 2018-02-28 DIAGNOSIS — Z993 Dependence on wheelchair: Secondary | ICD-10-CM | POA: Diagnosis not present

## 2018-02-28 DIAGNOSIS — L89513 Pressure ulcer of right ankle, stage 3: Secondary | ICD-10-CM | POA: Diagnosis not present

## 2018-02-28 DIAGNOSIS — E1142 Type 2 diabetes mellitus with diabetic polyneuropathy: Secondary | ICD-10-CM | POA: Diagnosis not present

## 2018-02-28 DIAGNOSIS — Z4800 Encounter for change or removal of nonsurgical wound dressing: Secondary | ICD-10-CM | POA: Diagnosis not present

## 2018-02-28 DIAGNOSIS — I10 Essential (primary) hypertension: Secondary | ICD-10-CM | POA: Diagnosis not present

## 2018-03-02 DIAGNOSIS — Z4800 Encounter for change or removal of nonsurgical wound dressing: Secondary | ICD-10-CM | POA: Diagnosis not present

## 2018-03-02 DIAGNOSIS — Z87440 Personal history of urinary (tract) infections: Secondary | ICD-10-CM | POA: Diagnosis not present

## 2018-03-02 DIAGNOSIS — L89513 Pressure ulcer of right ankle, stage 3: Secondary | ICD-10-CM | POA: Diagnosis not present

## 2018-03-02 DIAGNOSIS — Z993 Dependence on wheelchair: Secondary | ICD-10-CM | POA: Diagnosis not present

## 2018-03-02 DIAGNOSIS — E1142 Type 2 diabetes mellitus with diabetic polyneuropathy: Secondary | ICD-10-CM | POA: Diagnosis not present

## 2018-03-02 DIAGNOSIS — I10 Essential (primary) hypertension: Secondary | ICD-10-CM | POA: Diagnosis not present

## 2018-03-04 DIAGNOSIS — Z87440 Personal history of urinary (tract) infections: Secondary | ICD-10-CM | POA: Diagnosis not present

## 2018-03-04 DIAGNOSIS — Z79899 Other long term (current) drug therapy: Secondary | ICD-10-CM | POA: Diagnosis not present

## 2018-03-04 DIAGNOSIS — L89513 Pressure ulcer of right ankle, stage 3: Secondary | ICD-10-CM | POA: Diagnosis not present

## 2018-03-04 DIAGNOSIS — E1142 Type 2 diabetes mellitus with diabetic polyneuropathy: Secondary | ICD-10-CM | POA: Diagnosis not present

## 2018-03-04 DIAGNOSIS — M79671 Pain in right foot: Secondary | ICD-10-CM | POA: Diagnosis not present

## 2018-03-04 DIAGNOSIS — Z993 Dependence on wheelchair: Secondary | ICD-10-CM | POA: Diagnosis not present

## 2018-03-04 DIAGNOSIS — I10 Essential (primary) hypertension: Secondary | ICD-10-CM | POA: Diagnosis not present

## 2018-03-04 DIAGNOSIS — G894 Chronic pain syndrome: Secondary | ICD-10-CM | POA: Diagnosis not present

## 2018-03-04 DIAGNOSIS — Z4800 Encounter for change or removal of nonsurgical wound dressing: Secondary | ICD-10-CM | POA: Diagnosis not present

## 2018-03-07 DIAGNOSIS — Z87440 Personal history of urinary (tract) infections: Secondary | ICD-10-CM | POA: Diagnosis not present

## 2018-03-07 DIAGNOSIS — Z993 Dependence on wheelchair: Secondary | ICD-10-CM | POA: Diagnosis not present

## 2018-03-07 DIAGNOSIS — Z4800 Encounter for change or removal of nonsurgical wound dressing: Secondary | ICD-10-CM | POA: Diagnosis not present

## 2018-03-07 DIAGNOSIS — E1142 Type 2 diabetes mellitus with diabetic polyneuropathy: Secondary | ICD-10-CM | POA: Diagnosis not present

## 2018-03-07 DIAGNOSIS — L89513 Pressure ulcer of right ankle, stage 3: Secondary | ICD-10-CM | POA: Diagnosis not present

## 2018-03-07 DIAGNOSIS — I10 Essential (primary) hypertension: Secondary | ICD-10-CM | POA: Diagnosis not present

## 2018-03-09 DIAGNOSIS — Z87440 Personal history of urinary (tract) infections: Secondary | ICD-10-CM | POA: Diagnosis not present

## 2018-03-09 DIAGNOSIS — L89513 Pressure ulcer of right ankle, stage 3: Secondary | ICD-10-CM | POA: Diagnosis not present

## 2018-03-09 DIAGNOSIS — I10 Essential (primary) hypertension: Secondary | ICD-10-CM | POA: Diagnosis not present

## 2018-03-09 DIAGNOSIS — Z993 Dependence on wheelchair: Secondary | ICD-10-CM | POA: Diagnosis not present

## 2018-03-09 DIAGNOSIS — E1142 Type 2 diabetes mellitus with diabetic polyneuropathy: Secondary | ICD-10-CM | POA: Diagnosis not present

## 2018-03-09 DIAGNOSIS — Z4800 Encounter for change or removal of nonsurgical wound dressing: Secondary | ICD-10-CM | POA: Diagnosis not present

## 2018-03-11 DIAGNOSIS — Z87440 Personal history of urinary (tract) infections: Secondary | ICD-10-CM | POA: Diagnosis not present

## 2018-03-11 DIAGNOSIS — I10 Essential (primary) hypertension: Secondary | ICD-10-CM | POA: Diagnosis not present

## 2018-03-11 DIAGNOSIS — Z993 Dependence on wheelchair: Secondary | ICD-10-CM | POA: Diagnosis not present

## 2018-03-11 DIAGNOSIS — E1142 Type 2 diabetes mellitus with diabetic polyneuropathy: Secondary | ICD-10-CM | POA: Diagnosis not present

## 2018-03-11 DIAGNOSIS — L89513 Pressure ulcer of right ankle, stage 3: Secondary | ICD-10-CM | POA: Diagnosis not present

## 2018-03-11 DIAGNOSIS — Z4800 Encounter for change or removal of nonsurgical wound dressing: Secondary | ICD-10-CM | POA: Diagnosis not present

## 2018-03-14 DIAGNOSIS — Z993 Dependence on wheelchair: Secondary | ICD-10-CM | POA: Diagnosis not present

## 2018-03-14 DIAGNOSIS — Z87440 Personal history of urinary (tract) infections: Secondary | ICD-10-CM | POA: Diagnosis not present

## 2018-03-14 DIAGNOSIS — L89513 Pressure ulcer of right ankle, stage 3: Secondary | ICD-10-CM | POA: Diagnosis not present

## 2018-03-14 DIAGNOSIS — Z4800 Encounter for change or removal of nonsurgical wound dressing: Secondary | ICD-10-CM | POA: Diagnosis not present

## 2018-03-14 DIAGNOSIS — E1142 Type 2 diabetes mellitus with diabetic polyneuropathy: Secondary | ICD-10-CM | POA: Diagnosis not present

## 2018-03-14 DIAGNOSIS — I10 Essential (primary) hypertension: Secondary | ICD-10-CM | POA: Diagnosis not present

## 2018-03-16 DIAGNOSIS — L89513 Pressure ulcer of right ankle, stage 3: Secondary | ICD-10-CM | POA: Diagnosis not present

## 2018-03-16 DIAGNOSIS — I10 Essential (primary) hypertension: Secondary | ICD-10-CM | POA: Diagnosis not present

## 2018-03-16 DIAGNOSIS — Z87440 Personal history of urinary (tract) infections: Secondary | ICD-10-CM | POA: Diagnosis not present

## 2018-03-16 DIAGNOSIS — Z993 Dependence on wheelchair: Secondary | ICD-10-CM | POA: Diagnosis not present

## 2018-03-16 DIAGNOSIS — Z4800 Encounter for change or removal of nonsurgical wound dressing: Secondary | ICD-10-CM | POA: Diagnosis not present

## 2018-03-16 DIAGNOSIS — E1142 Type 2 diabetes mellitus with diabetic polyneuropathy: Secondary | ICD-10-CM | POA: Diagnosis not present

## 2018-03-18 DIAGNOSIS — I10 Essential (primary) hypertension: Secondary | ICD-10-CM | POA: Diagnosis not present

## 2018-03-18 DIAGNOSIS — Z4800 Encounter for change or removal of nonsurgical wound dressing: Secondary | ICD-10-CM | POA: Diagnosis not present

## 2018-03-18 DIAGNOSIS — Z87440 Personal history of urinary (tract) infections: Secondary | ICD-10-CM | POA: Diagnosis not present

## 2018-03-18 DIAGNOSIS — L89513 Pressure ulcer of right ankle, stage 3: Secondary | ICD-10-CM | POA: Diagnosis not present

## 2018-03-18 DIAGNOSIS — Z993 Dependence on wheelchair: Secondary | ICD-10-CM | POA: Diagnosis not present

## 2018-03-18 DIAGNOSIS — E1142 Type 2 diabetes mellitus with diabetic polyneuropathy: Secondary | ICD-10-CM | POA: Diagnosis not present

## 2018-03-23 DIAGNOSIS — Z4800 Encounter for change or removal of nonsurgical wound dressing: Secondary | ICD-10-CM | POA: Diagnosis not present

## 2018-03-23 DIAGNOSIS — E1142 Type 2 diabetes mellitus with diabetic polyneuropathy: Secondary | ICD-10-CM | POA: Diagnosis not present

## 2018-03-23 DIAGNOSIS — Z87440 Personal history of urinary (tract) infections: Secondary | ICD-10-CM | POA: Diagnosis not present

## 2018-03-23 DIAGNOSIS — L89513 Pressure ulcer of right ankle, stage 3: Secondary | ICD-10-CM | POA: Diagnosis not present

## 2018-03-23 DIAGNOSIS — I10 Essential (primary) hypertension: Secondary | ICD-10-CM | POA: Diagnosis not present

## 2018-03-23 DIAGNOSIS — Z993 Dependence on wheelchair: Secondary | ICD-10-CM | POA: Diagnosis not present

## 2018-03-28 DIAGNOSIS — Z4800 Encounter for change or removal of nonsurgical wound dressing: Secondary | ICD-10-CM | POA: Diagnosis not present

## 2018-03-28 DIAGNOSIS — E1142 Type 2 diabetes mellitus with diabetic polyneuropathy: Secondary | ICD-10-CM | POA: Diagnosis not present

## 2018-03-28 DIAGNOSIS — L89513 Pressure ulcer of right ankle, stage 3: Secondary | ICD-10-CM | POA: Diagnosis not present

## 2018-03-28 DIAGNOSIS — Z87440 Personal history of urinary (tract) infections: Secondary | ICD-10-CM | POA: Diagnosis not present

## 2018-03-28 DIAGNOSIS — I10 Essential (primary) hypertension: Secondary | ICD-10-CM | POA: Diagnosis not present

## 2018-03-28 DIAGNOSIS — Z993 Dependence on wheelchair: Secondary | ICD-10-CM | POA: Diagnosis not present

## 2018-04-08 DIAGNOSIS — M79671 Pain in right foot: Secondary | ICD-10-CM | POA: Diagnosis not present

## 2018-04-08 DIAGNOSIS — G894 Chronic pain syndrome: Secondary | ICD-10-CM | POA: Diagnosis not present

## 2018-04-11 ENCOUNTER — Emergency Department (HOSPITAL_COMMUNITY): Payer: Medicare Other

## 2018-04-11 ENCOUNTER — Other Ambulatory Visit: Payer: Self-pay

## 2018-04-11 ENCOUNTER — Encounter (HOSPITAL_COMMUNITY): Payer: Self-pay

## 2018-04-11 ENCOUNTER — Emergency Department (HOSPITAL_COMMUNITY)
Admission: EM | Admit: 2018-04-11 | Discharge: 2018-04-12 | Disposition: A | Discharge status: Home / Self Care | Payer: Medicare Other | Attending: Emergency Medicine | Admitting: Emergency Medicine

## 2018-04-11 DIAGNOSIS — Y939 Activity, unspecified: Secondary | ICD-10-CM | POA: Insufficient documentation

## 2018-04-11 DIAGNOSIS — S0990XA Unspecified injury of head, initial encounter: Secondary | ICD-10-CM | POA: Diagnosis not present

## 2018-04-11 DIAGNOSIS — Y999 Unspecified external cause status: Secondary | ICD-10-CM | POA: Insufficient documentation

## 2018-04-11 DIAGNOSIS — Z7902 Long term (current) use of antithrombotics/antiplatelets: Secondary | ICD-10-CM | POA: Insufficient documentation

## 2018-04-11 DIAGNOSIS — R630 Anorexia: Secondary | ICD-10-CM

## 2018-04-11 DIAGNOSIS — Z79899 Other long term (current) drug therapy: Secondary | ICD-10-CM | POA: Diagnosis not present

## 2018-04-11 DIAGNOSIS — I10 Essential (primary) hypertension: Secondary | ICD-10-CM | POA: Insufficient documentation

## 2018-04-11 DIAGNOSIS — W06XXXA Fall from bed, initial encounter: Secondary | ICD-10-CM | POA: Diagnosis not present

## 2018-04-11 DIAGNOSIS — Y92122 Bedroom in nursing home as the place of occurrence of the external cause: Secondary | ICD-10-CM | POA: Insufficient documentation

## 2018-04-11 DIAGNOSIS — E1140 Type 2 diabetes mellitus with diabetic neuropathy, unspecified: Secondary | ICD-10-CM | POA: Insufficient documentation

## 2018-04-11 DIAGNOSIS — J9811 Atelectasis: Secondary | ICD-10-CM | POA: Diagnosis not present

## 2018-04-11 DIAGNOSIS — R5383 Other fatigue: Secondary | ICD-10-CM | POA: Diagnosis not present

## 2018-04-11 DIAGNOSIS — F17210 Nicotine dependence, cigarettes, uncomplicated: Secondary | ICD-10-CM | POA: Diagnosis not present

## 2018-04-11 DIAGNOSIS — R531 Weakness: Secondary | ICD-10-CM | POA: Diagnosis not present

## 2018-04-11 DIAGNOSIS — R627 Adult failure to thrive: Secondary | ICD-10-CM | POA: Diagnosis not present

## 2018-04-11 LAB — COMPREHENSIVE METABOLIC PANEL
ALBUMIN: 3.1 g/dL — AB (ref 3.5–5.0)
ALK PHOS: 110 U/L (ref 38–126)
ALT: 13 U/L (ref 0–44)
AST: 24 U/L (ref 15–41)
Anion gap: 7 (ref 5–15)
BILIRUBIN TOTAL: 0.3 mg/dL (ref 0.3–1.2)
BUN: 31 mg/dL — ABNORMAL HIGH (ref 8–23)
CALCIUM: 8.5 mg/dL — AB (ref 8.9–10.3)
CO2: 24 mmol/L (ref 22–32)
CREATININE: 1.52 mg/dL — AB (ref 0.44–1.00)
Chloride: 106 mmol/L (ref 98–111)
GFR calc non Af Amer: 32 mL/min — ABNORMAL LOW (ref >60)
GFR, EST AFRICAN AMERICAN: 38 mL/min — AB (ref >60)
GLUCOSE: 137 mg/dL — AB (ref 70–99)
Potassium: 5.1 mmol/L (ref 3.5–5.1)
Sodium: 137 mmol/L (ref 135–145)
TOTAL PROTEIN: 6.7 g/dL (ref 6.5–8.1)

## 2018-04-11 LAB — CBC WITH DIFFERENTIAL/PLATELET
Basophils Absolute: 0 10*3/uL (ref 0.0–0.1)
Basophils Relative: 0 %
EOS PCT: 0 %
Eosinophils Absolute: 0 10*3/uL (ref 0.0–0.7)
HCT: 36.6 % (ref 36.0–46.0)
Hemoglobin: 11.2 g/dL — ABNORMAL LOW (ref 12.0–15.0)
LYMPHS ABS: 4.2 10*3/uL — AB (ref 0.7–4.0)
Lymphocytes Relative: 44 %
MCH: 27.1 pg (ref 26.0–34.0)
MCHC: 30.6 g/dL (ref 30.0–36.0)
MCV: 88.4 fL (ref 78.0–100.0)
MONOS PCT: 7 %
Monocytes Absolute: 0.7 10*3/uL (ref 0.1–1.0)
Neutro Abs: 4.7 10*3/uL (ref 1.7–7.7)
Neutrophils Relative %: 49 %
Platelets: 335 10*3/uL (ref 150–400)
RBC: 4.14 MIL/uL (ref 3.87–5.11)
RDW: 17.3 % — ABNORMAL HIGH (ref 11.5–15.5)
WBC: 9.7 10*3/uL (ref 4.0–10.5)

## 2018-04-11 LAB — URINALYSIS, ROUTINE W REFLEX MICROSCOPIC
BACTERIA UA: NONE SEEN
BILIRUBIN URINE: NEGATIVE
GLUCOSE, UA: NEGATIVE mg/dL
Hgb urine dipstick: NEGATIVE
Ketones, ur: 5 mg/dL — AB
LEUKOCYTES UA: NEGATIVE
NITRITE: NEGATIVE
Protein, ur: 100 mg/dL — AB
SPECIFIC GRAVITY, URINE: 1.024 (ref 1.005–1.030)
pH: 5 (ref 5.0–8.0)

## 2018-04-11 LAB — I-STAT TROPONIN, ED: TROPONIN I, POC: 0.02 ng/mL (ref 0.00–0.08)

## 2018-04-11 LAB — LIPASE, BLOOD: Lipase: 23 U/L (ref 11–51)

## 2018-04-11 MED ORDER — SODIUM CHLORIDE 0.9 % IV BOLUS
1000.0000 mL | Freq: Once | INTRAVENOUS | Status: AC
  Administered 2018-04-11: 1000 mL via INTRAVENOUS

## 2018-04-11 NOTE — Discharge Instructions (Addendum)
You were evaluated in the emergency department for decreased appetite.  You had blood work chest x-ray urinalysis and CAT scan of your head that did not show an obvious cause of your symptoms.  Please follow-up with your primary care doctor.

## 2018-04-11 NOTE — ED Notes (Signed)
[facility] called to transport pt 

## 2018-04-11 NOTE — ED Notes (Signed)
Bed: SW97 Expected date:  Expected time:  Means of arrival:  Comments: 77 yo FTT

## 2018-04-11 NOTE — ED Triage Notes (Signed)
Arrived via [facility] for not eating and weight loss.

## 2018-04-11 NOTE — ED Provider Notes (Signed)
[place] DEPT Provider Note   CSN: [id number] Arrival date & time: 04/11/18  1550     History   Chief Complaint Chief Complaint  Patient presents with  . Failure To Thrive    HPI Stephanie Olson is a 77 y.o. female.  She is transferred here from [facility] for evaluation of weight loss and decreased appetite over the last few weeks.  She states she has been there for about 10 months.  She does not like the food there.  She said she is been losing weight but is unable to provide a number.  She said she fell out of bed a week ago and struck her head and still has some head pain.  She was not evaluated for this and refused transport at that time.  She denies any chest pain or shortness of breath or abdominal pain.  No diarrhea constipation or urinary symptoms.  No numbness or focal weakness.  The history is provided by the patient.  Weakness  Primary symptoms include no focal weakness, no speech change, no visual change, no auditory change. This is a new problem. The current episode started more than 1 week ago. The problem has not changed since onset.There was no focality noted. There has been no fever. Associated symptoms include headaches. Pertinent negatives include no shortness of breath, no chest pain, no vomiting, no altered mental status and no confusion. There were no medications administered prior to arrival. Associated medical issues include trauma.    Past Medical History:  Diagnosis Date  . Arthritis   . Diabetes mellitus without complication ([place])   . Diabetic foot ulcers (HCC)    RIGHT   . Hypertension   . Peripheral vascular disease [facility])     Patient Active Problem List   Diagnosis Date Noted  . Diabetic neuropathy ([place]) 11/21/2015  . 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/2015  . Diabetic ulcer of foot, limited to breakdown of skin ([place]) 11/21/2015  . Foot pain, left 02/14/2015  . PVD (peripheral vascular disease) ([place]) 01/29/2015    Past  Surgical History:  Procedure Laterality Date  . ABDOMINAL AORTAGRAM  01/29/2015  . ATHERECTOMY Right 01/29/2015   FEMORAL ARTERY   . BALLOON ANGIOPLASTY, ARTERY Right 01/29/2015   RT FEMORAL   . FOOT SURGERY    . PERIPHERAL VASCULAR CATHETERIZATION N/A 01/29/2015   Procedure: Abdominal Aortogram w/Lower Extremity;  Surgeon: Serafina Mitchell, MD;  Location: [place] CV LAB;  Service: Cardiovascular;  Laterality: N/A;     OB History   None      Home Medications    Prior to Admission medications   Medication Sig Start Date End Date Taking? Authorizing Provider  ADVAIR DISKUS 250-50 MCG/DOSE AEPB Inhale 1 puff into the lungs daily as needed (Allergies).  11/02/14   [provider]  ALPRAZolam Duanne Moron) 1 MG tablet Take 1 mg by mouth at bedtime as needed for sleep.  11/02/14   [provider]  amLODipine (NORVASC) 5 MG tablet Take 5 mg by mouth daily. 11/02/14   [provider]  BISACODYL PO Take 10 mg by mouth as needed.    [provider]  clopidogrel (PLAVIX) 75 MG tablet TAKE 1 TABLET (75 MG TOTAL) BY MOUTH DAILY. 01/26/17   Angelia Mould, MD  Cyanocobalamin (VITAMIN B-12) 5000 MCG SUBL Place 5,000 mcg under the tongue daily.     [provider]  cyclobenzaprine (FLEXERIL) 5 MG tablet Take 5 mg by mouth 3 (three) times daily as  needed for muscle spasms.    [provider]  HYDROcodone-acetaminophen (NORCO/VICODIN) 5-325 MG tablet Take 1 tablet by mouth every 6 (six) hours as needed for moderate pain.    [provider]  LANTUS 100 UNIT/ML injection Take 35 Units by mouth every morning. 12/17/14   [provider]  lisinopril (PRINIVIL,ZESTRIL) 10 MG tablet Take 10 mg by mouth daily.    [provider]  naproxen sodium (ANAPROX) 220 MG tablet Take 220 mg by mouth 2 (two) times daily as needed (Pain).    [provider]  Omega-3 Fatty Acids (FISH OIL PO) Take 1 capsule by mouth daily.    [provider]  omeprazole (PRILOSEC) 20 MG capsule Take 20 mg by mouth daily.    [provider]  OVER THE COUNTER MEDICATION Place 1 spray into the nose daily as needed (Allergies). OTC nasal spray    [provider]  pravastatin (PRAVACHOL) 40 MG tablet Take 40 mg by mouth at bedtime. 12/25/14   [provider]  PROAIR HFA 108 (90 BASE) MCG/ACT inhaler Inhale 2 puffs into the lungs every 4 (four) hours as needed. 11/02/14   [provider]  sertraline (ZOLOFT) 50 MG tablet Take 50 mg by mouth daily.    [provider]  tetrahydrozoline 0.05 % ophthalmic solution Place 1 drop into both eyes 2 (two) times daily as needed (Eye drops).    [provider]  vitamin C (ASCORBIC ACID) 500 MG tablet Take 500 mg by mouth daily.    [provider]  Vitamin D, Cholecalciferol, 400 UNITS TABS Take by mouth daily.    [provider]    Family History Family History  Problem Relation Age of Onset  . Hypertension Mother   . Hypertension Father     Social History Social History   Tobacco Use  . Smoking status: Current Some Day Smoker    Types: Cigarettes    Last attempt to quit: 02/13/2014    Years since quitting: 4.1  . Smokeless tobacco: Never Used  . Tobacco comment: " I quit smoking along time ago "  Substance Use Topics  . Alcohol use: No    Alcohol/week: 0.0 oz  . Drug use: No     Allergies   Patient has no known allergies.   Review of Systems Review of Systems  Constitutional: Positive for appetite change and fatigue. Negative for fever.  HENT: Negative for sore throat.   Eyes: Negative for visual disturbance.  Respiratory: Negative for shortness of breath.   Cardiovascular: Negative for chest pain.  Gastrointestinal: Negative for abdominal pain and vomiting.  Genitourinary: Negative for dysuria and frequency.  Musculoskeletal: Negative for back pain and neck pain.  Skin: Negative for rash.  Neurological:  Positive for weakness and headaches. Negative for speech change and focal weakness.  Psychiatric/Behavioral: Negative for confusion.     Physical Exam Updated Vital Signs BP 111/71 (BP Location: Left Arm)   Pulse (!) 101   Temp 98.7 F (37.1 C) (Oral)   Resp 18   SpO2 98%   Physical Exam  Constitutional: She appears well-developed and well-nourished. No distress.  HENT:  Head: Normocephalic and atraumatic.  Mouth/Throat: No oropharyngeal exudate.  Eyes: Conjunctivae are normal.  Neck: Neck supple.  Cardiovascular: Regular rhythm, normal heart sounds and intact distal pulses. Tachycardia present.  No murmur heard. Pulmonary/Chest: Effort normal and breath sounds normal. No respiratory distress.  Abdominal: Soft. She exhibits no mass. There is no tenderness.  There is no guarding.  Musculoskeletal: Normal range of motion. She exhibits no edema, tenderness or deformity.  Neurological: She is alert.  Skin: Skin is warm and dry.  Psychiatric: She has a normal mood and affect.  Nursing note and vitals reviewed.    ED Treatments / Results  Labs (all labs ordered are listed, but only abnormal results are displayed) Labs Reviewed  COMPREHENSIVE METABOLIC PANEL - Abnormal; Notable for the following components:      Result Value   Glucose, Bld 137 (*)    BUN 31 (*)    Creatinine, Ser 1.52 (*)    Calcium 8.5 (*)    Albumin 3.1 (*)    GFR calc non Af Amer 32 (*)    GFR calc Af Amer 38 (*)    All other components within normal limits  CBC WITH DIFFERENTIAL/PLATELET - Abnormal; Notable for the following components:   Hemoglobin 11.2 (*)    RDW 17.3 (*)    Lymphs Abs 4.2 (*)    All other components within normal limits  URINALYSIS, ROUTINE W REFLEX MICROSCOPIC - Abnormal; Notable for the following components:   Ketones, ur 5 (*)    Protein, ur 100 (*)    All other components within normal limits  LIPASE, BLOOD  I-STAT TROPONIN, ED    EKG None  Radiology Dg Chest 2  View  Result Date: 04/11/2018 CLINICAL DATA:  Weakness. EXAM: CHEST - 2 VIEW COMPARISON:  None. FINDINGS: The heart size and mediastinal contours are within normal limits. Both lungs are clear except for a tiny area of atelectasis at the left base laterally. No effusions. No acute bone abnormality. 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 IMPRESSION: Minimal atelectasis at the left lung base. Electronically Signed   By: Lorriane Shire M.D.   On: 04/11/2018 17:01   Ct Head Wo Contrast  Result Date: 04/11/2018 CLINICAL DATA:  Fall with head trauma EXAM: CT HEAD WITHOUT CONTRAST TECHNIQUE: Contiguous axial images were obtained from the base of the skull through the vertex without intravenous contrast. COMPARISON:  None. FINDINGS: Brain: There is no mass, hemorrhage or extra-axial collection. The size and configuration of the ventricles and extra-axial CSF spaces are normal. There is an old left basal ganglia lacunar infarct. There is hypoattenuation of the periventricular white matter, most commonly indicating chronic ischemic microangiopathy. Vascular: No abnormal hyperdensity of the major intracranial arteries or dural venous sinuses. No intracranial atherosclerosis. Skull: The visualized skull base, calvarium and extracranial soft tissues are normal. Sinuses/Orbits: No fluid levels or advanced mucosal thickening of the visualized paranasal sinuses. No mastoid or middle ear effusion. The orbits are normal. IMPRESSION: 1. No acute intracranial abnormality. 2. 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 Electronically Signed   By: Ulyses Jarred M.D.   On: 04/11/2018 17:18    Procedures Procedures (including critical care time)  Medications Ordered in ED Medications  sodium chloride 0.9 % bolus 1,000 mL (has no administration in time range)     Initial Impression / Assessment and Plan / ED Course  I  have reviewed the triage vital signs and the nursing notes.  Pertinent labs & imaging results that were available during my care of the patient were reviewed by me and considered in my medical decision making (see chart for details).  Clinical Course as of Apr 12 1925  Mon Apr 11, 3520  [id number] 77 year old female sent in from  her facility here for evaluation of decreased appetite weight loss and weakness.  Patient states she just has no appetite.  There was a remote fall but she was not imaged for so I think is reasonable to get a head CT and make sure she does not have a subdural.  Is also mildly tachycardic here in a little dehydrated.  She is getting some screening labs EKG chest x-ray urinalysis.  IV fluids.   [MB]  0076 EKG is normal sinus rhythm with a rate of 97.  No acute ST-T changes and normal intervals.   [MB]  2263 Patient's work-up here has been fairly unremarkable.  Her creatinine is elevated but is better than her baseline.  She has no white count and no obvious urine infection, pneumonia or anything acute on the CT head.   [MB]  3354 I do not see any obvious reason the patient needs to be admitted to the hospital so she will be returned to her facility.   [MB]    Clinical Course User Index [MB] Hayden Rasmussen, MD      Final Clinical Impressions(s) / ED Diagnoses   Final diagnoses:  Failure to thrive in adult  Decreased appetite    ED Discharge Orders    None       Hayden Rasmussen, MD 04/12/18 [id number]

## 2018-04-12 DIAGNOSIS — Z7401 Bed confinement status: Secondary | ICD-10-CM | POA: Diagnosis not present

## 2018-04-12 DIAGNOSIS — M2550 Pain in unspecified joint: Secondary | ICD-10-CM | POA: Diagnosis not present

## 2018-04-12 NOTE — ED Notes (Signed)
Called [facility] again to check on patient, said she was second on the list

## 2018-04-12 NOTE — ED Notes (Signed)
Recalled [facility], pt was taken off list somehow, and now put her back on the list to go back home to [facility]

## 2018-04-15 DIAGNOSIS — R112 Nausea with vomiting, unspecified: Secondary | ICD-10-CM | POA: Diagnosis not present

## 2018-04-15 DIAGNOSIS — R51 Headache: Secondary | ICD-10-CM | POA: Diagnosis not present

## 2018-04-15 DIAGNOSIS — G894 Chronic pain syndrome: Secondary | ICD-10-CM | POA: Diagnosis not present

## 2018-04-15 DIAGNOSIS — L97509 Non-pressure chronic ulcer of other part of unspecified foot with unspecified severity: Secondary | ICD-10-CM | POA: Diagnosis not present

## 2018-04-15 DIAGNOSIS — I10 Essential (primary) hypertension: Secondary | ICD-10-CM | POA: Diagnosis not present

## 2018-04-17 DIAGNOSIS — R627 Adult failure to thrive: Secondary | ICD-10-CM | POA: Diagnosis not present

## 2018-04-17 DIAGNOSIS — M129 Arthropathy, unspecified: Secondary | ICD-10-CM | POA: Diagnosis not present

## 2018-04-17 DIAGNOSIS — I10 Essential (primary) hypertension: Secondary | ICD-10-CM | POA: Diagnosis not present

## 2018-04-17 DIAGNOSIS — E119 Type 2 diabetes mellitus without complications: Secondary | ICD-10-CM | POA: Diagnosis not present

## 2018-04-17 DIAGNOSIS — M6281 Muscle weakness (generalized): Secondary | ICD-10-CM | POA: Diagnosis not present

## 2018-04-17 DIAGNOSIS — Z9181 History of falling: Secondary | ICD-10-CM | POA: Diagnosis not present

## 2018-04-18 DIAGNOSIS — R627 Adult failure to thrive: Secondary | ICD-10-CM | POA: Diagnosis not present

## 2018-04-18 DIAGNOSIS — M6281 Muscle weakness (generalized): Secondary | ICD-10-CM | POA: Diagnosis not present

## 2018-04-18 DIAGNOSIS — Z9181 History of falling: Secondary | ICD-10-CM | POA: Diagnosis not present

## 2018-04-18 DIAGNOSIS — E119 Type 2 diabetes mellitus without complications: Secondary | ICD-10-CM | POA: Diagnosis not present

## 2018-04-18 DIAGNOSIS — I10 Essential (primary) hypertension: Secondary | ICD-10-CM | POA: Diagnosis not present

## 2018-04-18 DIAGNOSIS — M129 Arthropathy, unspecified: Secondary | ICD-10-CM | POA: Diagnosis not present

## 2018-04-21 DIAGNOSIS — R627 Adult failure to thrive: Secondary | ICD-10-CM | POA: Diagnosis not present

## 2018-04-21 DIAGNOSIS — E119 Type 2 diabetes mellitus without complications: Secondary | ICD-10-CM | POA: Diagnosis not present

## 2018-04-21 DIAGNOSIS — Z9181 History of falling: Secondary | ICD-10-CM | POA: Diagnosis not present

## 2018-04-21 DIAGNOSIS — M129 Arthropathy, unspecified: Secondary | ICD-10-CM | POA: Diagnosis not present

## 2018-04-21 DIAGNOSIS — M6281 Muscle weakness (generalized): Secondary | ICD-10-CM | POA: Diagnosis not present

## 2018-04-21 DIAGNOSIS — I10 Essential (primary) hypertension: Secondary | ICD-10-CM | POA: Diagnosis not present

## 2018-04-22 ENCOUNTER — Other Ambulatory Visit: Payer: Self-pay

## 2018-04-22 ENCOUNTER — Emergency Department (HOSPITAL_COMMUNITY): Payer: Medicare Other

## 2018-04-22 ENCOUNTER — Inpatient Hospital Stay (HOSPITAL_COMMUNITY): Payer: Medicare Other

## 2018-04-22 ENCOUNTER — Encounter (HOSPITAL_COMMUNITY): Payer: Self-pay | Admitting: *Deleted

## 2018-04-22 ENCOUNTER — Inpatient Hospital Stay (HOSPITAL_COMMUNITY)
Admission: EM | Admit: 2018-04-22 | Discharge: 2018-04-27 | DRG: 682 | Disposition: A | Discharge status: Skilled Nursing Facility | Payer: Medicare Other | Attending: Family Medicine | Admitting: Family Medicine

## 2018-04-22 DIAGNOSIS — I739 Peripheral vascular disease, unspecified: Secondary | ICD-10-CM

## 2018-04-22 DIAGNOSIS — E872 Acidosis, unspecified: Secondary | ICD-10-CM

## 2018-04-22 DIAGNOSIS — Z681 Body mass index (BMI) 19 or less, adult: Secondary | ICD-10-CM | POA: Diagnosis not present

## 2018-04-22 DIAGNOSIS — R5383 Other fatigue: Secondary | ICD-10-CM | POA: Diagnosis not present

## 2018-04-22 DIAGNOSIS — Z7401 Bed confinement status: Secondary | ICD-10-CM | POA: Diagnosis not present

## 2018-04-22 DIAGNOSIS — J690 Pneumonitis due to inhalation of food and vomit: Secondary | ICD-10-CM | POA: Diagnosis not present

## 2018-04-22 DIAGNOSIS — N390 Urinary tract infection, site not specified: Secondary | ICD-10-CM | POA: Diagnosis present

## 2018-04-22 DIAGNOSIS — R339 Retention of urine, unspecified: Secondary | ICD-10-CM

## 2018-04-22 DIAGNOSIS — L8990 Pressure ulcer of unspecified site, unspecified stage: Secondary | ICD-10-CM

## 2018-04-22 DIAGNOSIS — N1330 Unspecified hydronephrosis: Secondary | ICD-10-CM | POA: Diagnosis present

## 2018-04-22 DIAGNOSIS — E440 Moderate protein-calorie malnutrition: Secondary | ICD-10-CM | POA: Diagnosis present

## 2018-04-22 DIAGNOSIS — J189 Pneumonia, unspecified organism: Secondary | ICD-10-CM | POA: Diagnosis not present

## 2018-04-22 DIAGNOSIS — R110 Nausea: Secondary | ICD-10-CM | POA: Diagnosis not present

## 2018-04-22 DIAGNOSIS — L89152 Pressure ulcer of sacral region, stage 2: Secondary | ICD-10-CM | POA: Diagnosis not present

## 2018-04-22 DIAGNOSIS — Z79890 Hormone replacement therapy (postmenopausal): Secondary | ICD-10-CM | POA: Diagnosis not present

## 2018-04-22 DIAGNOSIS — N179 Acute kidney failure, unspecified: Secondary | ICD-10-CM | POA: Diagnosis not present

## 2018-04-22 DIAGNOSIS — Z79899 Other long term (current) drug therapy: Secondary | ICD-10-CM

## 2018-04-22 DIAGNOSIS — M2550 Pain in unspecified joint: Secondary | ICD-10-CM | POA: Diagnosis not present

## 2018-04-22 DIAGNOSIS — D649 Anemia, unspecified: Secondary | ICD-10-CM | POA: Diagnosis not present

## 2018-04-22 DIAGNOSIS — K8020 Calculus of gallbladder without cholecystitis without obstruction: Secondary | ICD-10-CM | POA: Diagnosis not present

## 2018-04-22 DIAGNOSIS — Z7902 Long term (current) use of antithrombotics/antiplatelets: Secondary | ICD-10-CM

## 2018-04-22 DIAGNOSIS — F17210 Nicotine dependence, cigarettes, uncomplicated: Secondary | ICD-10-CM | POA: Diagnosis present

## 2018-04-22 DIAGNOSIS — E875 Hyperkalemia: Secondary | ICD-10-CM

## 2018-04-22 DIAGNOSIS — E1151 Type 2 diabetes mellitus with diabetic peripheral angiopathy without gangrene: Secondary | ICD-10-CM | POA: Diagnosis not present

## 2018-04-22 DIAGNOSIS — N319 Neuromuscular dysfunction of bladder, unspecified: Secondary | ICD-10-CM | POA: Diagnosis present

## 2018-04-22 DIAGNOSIS — R627 Adult failure to thrive: Secondary | ICD-10-CM | POA: Diagnosis not present

## 2018-04-22 DIAGNOSIS — N189 Chronic kidney disease, unspecified: Secondary | ICD-10-CM

## 2018-04-22 DIAGNOSIS — L89510 Pressure ulcer of right ankle, unstageable: Secondary | ICD-10-CM | POA: Diagnosis not present

## 2018-04-22 DIAGNOSIS — R531 Weakness: Secondary | ICD-10-CM

## 2018-04-22 DIAGNOSIS — E0842 Diabetes mellitus due to underlying condition with diabetic polyneuropathy: Secondary | ICD-10-CM

## 2018-04-22 DIAGNOSIS — E1122 Type 2 diabetes mellitus with diabetic chronic kidney disease: Secondary | ICD-10-CM | POA: Diagnosis not present

## 2018-04-22 DIAGNOSIS — T17908A Unspecified foreign body in respiratory tract, part unspecified causing other injury, initial encounter: Secondary | ICD-10-CM | POA: Diagnosis not present

## 2018-04-22 DIAGNOSIS — R338 Other retention of urine: Secondary | ICD-10-CM

## 2018-04-22 DIAGNOSIS — R630 Anorexia: Secondary | ICD-10-CM | POA: Diagnosis not present

## 2018-04-22 DIAGNOSIS — D638 Anemia in other chronic diseases classified elsewhere: Secondary | ICD-10-CM | POA: Diagnosis present

## 2018-04-22 DIAGNOSIS — N183 Chronic kidney disease, stage 3 (moderate): Secondary | ICD-10-CM

## 2018-04-22 DIAGNOSIS — E1140 Type 2 diabetes mellitus with diabetic neuropathy, unspecified: Secondary | ICD-10-CM | POA: Diagnosis present

## 2018-04-22 DIAGNOSIS — I10 Essential (primary) hypertension: Secondary | ICD-10-CM

## 2018-04-22 DIAGNOSIS — J9811 Atelectasis: Secondary | ICD-10-CM | POA: Diagnosis not present

## 2018-04-22 DIAGNOSIS — R1111 Vomiting without nausea: Secondary | ICD-10-CM | POA: Diagnosis not present

## 2018-04-22 DIAGNOSIS — I129 Hypertensive chronic kidney disease with stage 1 through stage 4 chronic kidney disease, or unspecified chronic kidney disease: Secondary | ICD-10-CM | POA: Diagnosis present

## 2018-04-22 DIAGNOSIS — R000 Tachycardia, unspecified: Secondary | ICD-10-CM | POA: Diagnosis not present

## 2018-04-22 DIAGNOSIS — B961 Klebsiella pneumoniae [K. pneumoniae] as the cause of diseases classified elsewhere: Secondary | ICD-10-CM | POA: Diagnosis present

## 2018-04-22 DIAGNOSIS — N138 Other obstructive and reflux uropathy: Secondary | ICD-10-CM | POA: Diagnosis present

## 2018-04-22 LAB — I-STAT TROPONIN, ED: Troponin i, poc: 0.02 ng/mL (ref 0.00–0.08)

## 2018-04-22 LAB — COMPREHENSIVE METABOLIC PANEL
ALBUMIN: 2.6 g/dL — AB (ref 3.5–5.0)
ALK PHOS: 80 U/L (ref 38–126)
ALT: 13 U/L (ref 0–44)
AST: 15 U/L (ref 15–41)
Anion gap: 15 (ref 5–15)
BUN: 109 mg/dL — ABNORMAL HIGH (ref 8–23)
CALCIUM: 8.2 mg/dL — AB (ref 8.9–10.3)
CO2: 15 mmol/L — ABNORMAL LOW (ref 22–32)
Chloride: 106 mmol/L (ref 98–111)
Creatinine, Ser: 8.15 mg/dL — ABNORMAL HIGH (ref 0.44–1.00)
GFR, EST AFRICAN AMERICAN: 5 mL/min — AB (ref >60)
GFR, EST NON AFRICAN AMERICAN: 4 mL/min — AB (ref >60)
GLUCOSE: 92 mg/dL (ref 70–99)
POTASSIUM: 5.8 mmol/L — AB (ref 3.5–5.1)
Sodium: 136 mmol/L (ref 135–145)
Total Bilirubin: 0.6 mg/dL (ref 0.3–1.2)
Total Protein: 6.4 g/dL — ABNORMAL LOW (ref 6.5–8.1)

## 2018-04-22 LAB — URINALYSIS, ROUTINE W REFLEX MICROSCOPIC
Bilirubin Urine: NEGATIVE
GLUCOSE, UA: NEGATIVE mg/dL
Ketones, ur: NEGATIVE mg/dL
NITRITE: NEGATIVE
PH: 5 (ref 5.0–8.0)
Protein, ur: 100 mg/dL — AB
Renal Epithelial: 11
SPECIFIC GRAVITY, URINE: 1.013 (ref 1.005–1.030)

## 2018-04-22 LAB — CBC WITH DIFFERENTIAL/PLATELET
BASOS PCT: 0 %
Basophils Absolute: 0 10*3/uL (ref 0.0–0.1)
Eosinophils Absolute: 0 10*3/uL (ref 0.0–0.7)
Eosinophils Relative: 0 %
HEMATOCRIT: 32.6 % — AB (ref 36.0–46.0)
HEMOGLOBIN: 10.2 g/dL — AB (ref 12.0–15.0)
LYMPHS ABS: 2.8 10*3/uL (ref 0.7–4.0)
Lymphocytes Relative: 22 %
MCH: 27.3 pg (ref 26.0–34.0)
MCHC: 31.3 g/dL (ref 30.0–36.0)
MCV: 87.2 fL (ref 78.0–100.0)
MONO ABS: 1.1 10*3/uL — AB (ref 0.1–1.0)
MONOS PCT: 9 %
NEUTROS ABS: 8.7 10*3/uL — AB (ref 1.7–7.7)
NEUTROS PCT: 69 %
Platelets: 455 10*3/uL — ABNORMAL HIGH (ref 150–400)
RBC: 3.74 MIL/uL — ABNORMAL LOW (ref 3.87–5.11)
RDW: 18.7 % — AB (ref 11.5–15.5)
WBC: 12.7 10*3/uL — ABNORMAL HIGH (ref 4.0–10.5)

## 2018-04-22 LAB — I-STAT CHEM 8, ED
BUN: 100 mg/dL — ABNORMAL HIGH (ref 8–23)
CHLORIDE: 108 mmol/L (ref 98–111)
Calcium, Ion: 1.05 mmol/L — ABNORMAL LOW (ref 1.15–1.40)
Creatinine, Ser: 8.3 mg/dL — ABNORMAL HIGH (ref 0.44–1.00)
GLUCOSE: 90 mg/dL (ref 70–99)
HEMATOCRIT: 32 % — AB (ref 36.0–46.0)
HEMOGLOBIN: 10.9 g/dL — AB (ref 12.0–15.0)
POTASSIUM: 5.7 mmol/L — AB (ref 3.5–5.1)
SODIUM: 133 mmol/L — AB (ref 135–145)
TCO2: 16 mmol/L — ABNORMAL LOW (ref 22–32)

## 2018-04-22 LAB — STREP PNEUMONIAE URINARY ANTIGEN: STREP PNEUMO URINARY ANTIGEN: NEGATIVE

## 2018-04-22 LAB — I-STAT CG4 LACTIC ACID, ED: LACTIC ACID, VENOUS: 0.55 mmol/L (ref 0.5–1.9)

## 2018-04-22 LAB — MAGNESIUM: Magnesium: 2.5 mg/dL — ABNORMAL HIGH (ref 1.7–2.4)

## 2018-04-22 LAB — CBG MONITORING, ED: GLUCOSE-CAPILLARY: 104 mg/dL — AB (ref 70–99)

## 2018-04-22 LAB — PHOSPHORUS: Phosphorus: 6.1 mg/dL — ABNORMAL HIGH (ref 2.5–4.6)

## 2018-04-22 MED ORDER — INSULIN ASPART 100 UNIT/ML ~~LOC~~ SOLN
0.0000 [IU] | Freq: Three times a day (TID) | SUBCUTANEOUS | Status: DC
  Administered 2018-04-23 (×2): 2 [IU] via SUBCUTANEOUS
  Administered 2018-04-24 – 2018-04-25 (×2): 1 [IU] via SUBCUTANEOUS
  Administered 2018-04-26: 2 [IU] via SUBCUTANEOUS

## 2018-04-22 MED ORDER — SERTRALINE HCL 50 MG PO TABS
75.0000 mg | ORAL_TABLET | Freq: Every day | ORAL | Status: DC
  Administered 2018-04-23 – 2018-04-27 (×5): 75 mg via ORAL
  Filled 2018-04-22 (×5): qty 2

## 2018-04-22 MED ORDER — VANCOMYCIN HCL IN DEXTROSE 1-5 GM/200ML-% IV SOLN
1000.0000 mg | Freq: Once | INTRAVENOUS | Status: AC
  Administered 2018-04-22: 1000 mg via INTRAVENOUS
  Filled 2018-04-22: qty 200

## 2018-04-22 MED ORDER — AMLODIPINE BESYLATE 5 MG PO TABS
5.0000 mg | ORAL_TABLET | Freq: Every day | ORAL | Status: DC
  Administered 2018-04-23: 5 mg via ORAL
  Filled 2018-04-22 (×2): qty 1

## 2018-04-22 MED ORDER — ONDANSETRON HCL 4 MG/2ML IJ SOLN: 4.0000 mg | Freq: Four times a day (QID) | INTRAMUSCULAR | Status: DC | PRN

## 2018-04-22 MED ORDER — SODIUM CHLORIDE 0.9 % IV SOLN
INTRAVENOUS | Status: AC
  Administered 2018-04-22 – 2018-04-23 (×2): via INTRAVENOUS

## 2018-04-22 MED ORDER — INSULIN ASPART 100 UNIT/ML ~~LOC~~ SOLN: 0.0000 [IU] | Freq: Three times a day (TID) | SUBCUTANEOUS | Status: DC

## 2018-04-22 MED ORDER — MIRTAZAPINE 7.5 MG PO TABS
7.5000 mg | ORAL_TABLET | Freq: Every day | ORAL | Status: DC
  Administered 2018-04-22 – 2018-04-27 (×6): 7.5 mg via ORAL
  Filled 2018-04-22 (×6): qty 1

## 2018-04-22 MED ORDER — ENSURE ENLIVE PO LIQD
237.0000 mL | Freq: Two times a day (BID) | ORAL | Status: DC
  Administered 2018-04-23 – 2018-04-27 (×4): 237 mL via ORAL

## 2018-04-22 MED ORDER — SODIUM BICARBONATE 8.4 % IV SOLN
50.0000 meq | Freq: Once | INTRAVENOUS | Status: AC
  Administered 2018-04-22: 50 meq via INTRAVENOUS
  Filled 2018-04-22: qty 50

## 2018-04-22 MED ORDER — PIPERACILLIN-TAZOBACTAM IN DEX 2-0.25 GM/50ML IV SOLN
2.2500 g | Freq: Three times a day (TID) | INTRAVENOUS | Status: DC
  Administered 2018-04-22: 2.25 g via INTRAVENOUS
  Filled 2018-04-22 (×4): qty 50

## 2018-04-22 MED ORDER — SODIUM CHLORIDE 0.9 % IV SOLN
1.0000 g | Freq: Once | INTRAVENOUS | Status: AC
  Administered 2018-04-22: 1 g via INTRAVENOUS
  Filled 2018-04-22: qty 1

## 2018-04-22 MED ORDER — ACETAMINOPHEN 325 MG PO TABS
650.0000 mg | ORAL_TABLET | Freq: Four times a day (QID) | ORAL | Status: DC | PRN
  Administered 2018-04-26 – 2018-04-27 (×3): 650 mg via ORAL
  Filled 2018-04-22 (×3): qty 2

## 2018-04-22 MED ORDER — ACETAMINOPHEN 650 MG RE SUPP: 650.0000 mg | Freq: Four times a day (QID) | RECTAL | Status: DC | PRN

## 2018-04-22 MED ORDER — ONDANSETRON HCL 4 MG PO TABS: 4.0000 mg | ORAL_TABLET | Freq: Four times a day (QID) | ORAL | Status: DC | PRN

## 2018-04-22 MED ORDER — VITAMINS A & D EX OINT
TOPICAL_OINTMENT | CUTANEOUS | Status: AC
  Administered 2018-04-22: 22:00:00
  Filled 2018-04-22: qty 5

## 2018-04-22 MED ORDER — SODIUM CHLORIDE 0.9 % IV SOLN
1.0000 g | Freq: Once | INTRAVENOUS | Status: AC
  Administered 2018-04-22: 1 g via INTRAVENOUS
  Filled 2018-04-22: qty 10

## 2018-04-22 MED ORDER — BISACODYL 10 MG RE SUPP: 10.0000 mg | Freq: Every day | RECTAL | Status: DC | PRN

## 2018-04-22 MED ORDER — HEPARIN SODIUM (PORCINE) 5000 UNIT/ML IJ SOLN
5000.0000 [IU] | Freq: Three times a day (TID) | INTRAMUSCULAR | Status: DC
  Administered 2018-04-22 – 2018-04-27 (×14): 5000 [IU] via SUBCUTANEOUS
  Filled 2018-04-22 (×15): qty 1

## 2018-04-22 MED ORDER — INSULIN ASPART 100 UNIT/ML ~~LOC~~ SOLN: 0.0000 [IU] | Freq: Every day | SUBCUTANEOUS | Status: DC

## 2018-04-22 MED ORDER — ONDANSETRON HCL 4 MG/2ML IJ SOLN
4.0000 mg | Freq: Once | INTRAMUSCULAR | Status: AC
  Administered 2018-04-22: 4 mg via INTRAVENOUS
  Filled 2018-04-22: qty 2

## 2018-04-22 MED ORDER — SODIUM CHLORIDE 0.9 % IV BOLUS
1000.0000 mL | Freq: Once | INTRAVENOUS | Status: AC
  Administered 2018-04-22: 1000 mL via INTRAVENOUS

## 2018-04-22 MED ORDER — HYDROCODONE-ACETAMINOPHEN 5-325 MG PO TABS
1.0000 | ORAL_TABLET | Freq: Four times a day (QID) | ORAL | Status: DC | PRN
  Administered 2018-04-22 – 2018-04-27 (×8): 1 via ORAL
  Filled 2018-04-22 (×8): qty 1

## 2018-04-22 NOTE — ED Notes (Signed)
Bed: GT36 Expected date:  Expected time:  Means of arrival:  Comments: 77 yo weakness, anorexia

## 2018-04-22 NOTE — H&P (Signed)
History and Physical    Stephanie Olson TOI:[id number] DOB: 01-17-1941 DOA: 04/22/2018  PCP: Benito Mccreedy, MD   Patient coming from: [facility]  Chief Complaint: Poor Appetite, Generalized Weakness and Fatigue  HPI: Stephanie Olson is a 77 y.o. female with medical history significant of HTN, DM, PVD, Normocytic Anemia and other comorbids who presents with along with generalized weakness and fatigue.  Patient has been feeling more nauseous and vomiting and refusing to eat for the last week and states that since she fell last Friday she has not been well.  Denies any fevers or chills.  Her main complaint is her very poor appetite.  Denies chest pain, lightheadedness or dizziness.  Did admit to having some lower abdominal pain.  Also stated that she is has extreme difficulty urinating and states that she has not been urinating as much.  No other concerns or complaints at this time [facility] was called to admit this patient for generalized weakness and fatigue along with acute kidney injury on chronic kidney disease.  ED Course: The ED basic blood work was done patient was given 1 L normal saline bolus along with IV Zofran and started on IV vancomycin IV cefepime for suspected aspiration pneumonia.  Review of Systems: As per HPI otherwise 10 point review of systems negative.   Past Medical History:  Diagnosis Date  . Arthritis   . Diabetes mellitus without complication ([place])   . Diabetic foot ulcers (HCC)    RIGHT   . Hypertension   . Peripheral vascular disease [facility])    Past Surgical History:  Procedure Laterality Date  . ABDOMINAL AORTAGRAM  01/29/2015  . ATHERECTOMY Right 01/29/2015   FEMORAL ARTERY   . BALLOON ANGIOPLASTY, ARTERY Right 01/29/2015   RT FEMORAL   . FOOT SURGERY    . PERIPHERAL VASCULAR CATHETERIZATION N/A 01/29/2015   Procedure: Abdominal Aortogram w/Lower Extremity;  Surgeon: Serafina Mitchell, MD;  Location: [place] [facility];  Service: Cardiovascular;  Laterality:  N/A;   SOCIAL HISTORY  reports that she has been smoking cigarettes. She has never used smokeless tobacco. She reports that she does not drink alcohol or use drugs.  ALLERGIES No Known Allergies  Family History  Problem Relation Age of Onset  . Hypertension Mother   . Hypertension Father    Prior to Admission medications   Medication Sig Start Date End Date Taking? Authorizing Provider  acetaminophen (TYLENOL) 500 MG tablet Take 500 mg by mouth every 6 (six) hours as needed.   Yes [provider]  amLODipine (NORVASC) 5 MG tablet Take 5 mg by mouth daily. 11/02/14  Yes [provider]  BISACODYL PO Take 10 mg by mouth as needed (constipation).    Yes [provider]  cyclobenzaprine (FLEXERIL) 5 MG tablet Take 5 mg by mouth 3 (three) times daily as needed for muscle spasms.   Yes [provider]  ENSURE PLUS (ENSURE PLUS) LIQD Take 237 mLs by mouth 2 (two) times daily.   Yes [provider]  HYDROcodone-acetaminophen (NORCO/VICODIN) 5-325 MG tablet Take 1 tablet by mouth every 6 (six) hours as needed for moderate pain.   Yes [provider]  Melatonin 3 MG TABS Take 3 mg by mouth at bedtime.    Yes [provider]  mirtazapine (REMERON) 7.5 MG tablet Take 7.5 mg by mouth at bedtime.   Yes [provider]  promethazine (PHENERGAN) 25 MG tablet Take 25 mg by mouth every 6 (six) hours as needed  for nausea or vomiting.   Yes [provider]  sertraline (ZOLOFT) 50 MG tablet Take 75 mg by mouth daily.    Yes [provider]  clopidogrel (PLAVIX) 75 MG tablet TAKE 1 TABLET (75 MG TOTAL) BY MOUTH DAILY. Patient not taking: Reported on 04/11/2018 01/26/17   Angelia Mould, MD   Physical Exam: Vitals:   04/22/18 1340 04/22/18 1341 04/22/18 1600 04/22/18 1700  BP: 108/72  131/71 119/74  Pulse: (!) 109  (!) 104 (!) 104  Resp: 15  15 15   Temp: 98.3 F (36.8 C)     TempSrc: Oral     SpO2: 100%  98%  99%  Weight:  61.2 kg    Height:  5\' 9"  (1.753 m)     Constitutional: Thin AAF in NAD and appears calm and complaining of poor Appetite  Eyes: PERRL, lids and conjunctivae normal, sclerae anicteric  ENMT: External Ears, Nose appear normal. Grossly normal hearing. Mucous membranes are slightly dry. Neck: Appears normal, supple, no cervical masses, normal ROM, no appreciable thyromegaly, no JVD Respiratory: Diminished to auscultation bilaterally more on the Left than Right, no wheezing, rales, rhonchi or crackles. Normal respiratory effort and patient is not tachypenic. No accessory muscle use.  Cardiovascular: Tachycardic Rate but Regular Rhythm, no murmurs / rubs / gallops. S1 and S2 auscultated. No extremity edema.  Abdomen: Soft, Tender in the lower abdomen, mildly distended. No masses palpated. No appreciable hepatosplenomegaly. Bowel sounds positive x 4 GU: Deferred. Foley Catheter in place Musculoskeletal: No clubbing / cyanosis of digits/nails. Good ROM, no contractures.  Skin: No rashes, lesions, ulcers on a limited skin evaluation. No induration; Warm and dry.  Neurologic: CN 2-12 grossly intact with no focal deficits.  Romberg sign and cerebellar reflexes not assessed.  Psychiatric: Normal judgment and insight. Alert and oriented x 2. Pleasant mood and appropriate affect.   Labs on Admission: I have personally reviewed following labs and imaging studies  CBC: Recent Labs  Lab 04/22/18 1453 04/22/18 1502  WBC 12.7*  --   NEUTROABS 8.7*  --   HGB 10.2* 10.9*  HCT 32.6* 32.0*  MCV 87.2  --   PLT 455*  --    Basic Metabolic Panel: Recent Labs  Lab 04/22/18 1453 04/22/18 1502  NA 136 133*  K 5.8* 5.7*  CL 106 108  CO2 15*  --   GLUCOSE 92 90  BUN 109* 100*  CREATININE 8.15* 8.30*  CALCIUM 8.2*  --    GFR: Estimated Creatinine Clearance: 5.7 mL/min (A) (by C-G formula based on SCr of 8.3 mg/dL (H)). Liver Function Tests: Recent Labs  Lab 04/22/18 1453  AST 15    ALT 13  ALKPHOS 80  BILITOT 0.6  PROT 6.4*  ALBUMIN 2.6*   No results for input(s): LIPASE, AMYLASE in the last 168 hours. No results for input(s): AMMONIA in the last 168 hours. Coagulation Profile: No results for input(s): INR, PROTIME in the last 168 hours. Cardiac Enzymes: No results for input(s): CKTOTAL, CKMB, CKMBINDEX, TROPONINI in the last 168 hours. BNP (last 3 results) No results for input(s): PROBNP in the last 8760 hours. HbA1C: No results for input(s): HGBA1C in the last 72 hours. CBG: Recent Labs  Lab 04/22/18 1744  GLUCAP 104*   Lipid Profile: No results for input(s): CHOL, HDL, LDLCALC, TRIG, CHOLHDL, LDLDIRECT in the last 72 hours. Thyroid Function Tests: No results for input(s): TSH, T4TOTAL, FREET4, T3FREE, THYROIDAB in the last 72 hours. Anemia Panel: No results  for input(s): VITAMINB12, FOLATE, FERRITIN, TIBC, IRON, RETICCTPCT in the last 72 hours. Urine analysis:    Component Value Date/Time   COLORURINE YELLOW 04/22/2018 1329   APPEARANCEUR CLEAR 04/22/2018 1329   LABSPEC 1.013 04/22/2018 1329   PHURINE 5.0 04/22/2018 1329   GLUCOSEU NEGATIVE 04/22/2018 1329   HGBUR MODERATE (A) 04/22/2018 1329   BILIRUBINUR NEGATIVE 04/22/2018 1329   KETONESUR NEGATIVE 04/22/2018 1329   PROTEINUR 100 (A) 04/22/2018 1329   UROBILINOGEN 0.2 03/03/2010 0808   NITRITE NEGATIVE 04/22/2018 1329   LEUKOCYTESUR LARGE (A) 04/22/2018 1329   Sepsis Labs: !!!!!!!!!!!!!!!!!!!!!!!!!!!!!!!!!!!!!!!!!!!! @LABRCNTIP (procalcitonin:4,lacticidven:4) )No results found for this or any previous visit (from the past 240 hour(s)).   Radiological Exams on Admission: Ct Abdomen Pelvis Wo Contrast  Result Date: 04/22/2018 CLINICAL DATA:  Weakness, lethargy and decreased appetite. Nausea. Remote abdominal surgery, unspecified. EXAM: CT ABDOMEN AND PELVIS WITHOUT CONTRAST TECHNIQUE: Multidetector CT imaging of the abdomen and pelvis was performed following the standard protocol without  IV contrast. COMPARISON:  CT 03/03/2010. FINDINGS: Lower chest: There are streaky left of the right lower lobe airspace opacities which could reflect atelectasis, although are suspicious for possible aspiration. There is a small left pleural effusion. No significant pericardial fluid. There is aortic and coronary artery atherosclerosis. Hepatobiliary: No focal hepatic abnormality is identified on noncontrast imaging. There is no significant biliary dilatation. The gallbladder is significantly distended, similar to the previous study. There is no evidence gallbladder wall thickening or surrounding inflammation. Multiple dependent small gallstones are present in the gallbladder lumen. Pancreas: Atrophied without focal abnormality, ductal dilatation or surrounding inflammation. Spleen: Normal in size. Small calcified granuloma. No other focal abnormality. Adrenals/Urinary Tract: Stable mild thickening of both adrenal glands without focal nodule. Both kidneys demonstrate mild cortical thinning and at least moderate hydronephrosis and hydroureter, worse on the right. There is mild asymmetric perinephric soft tissue stranding on the right. No ureteral calculus or focal mass lesion identified. The urinary bladder is markedly distended without apparent focal abnormality. Stomach/Bowel: No evidence of bowel wall thickening, distention or surrounding inflammatory change. Mild proximal colonic diverticulosis. The appendix appears normal. Vascular/Lymphatic: There are no enlarged abdominal or pelvic lymph nodes. Aortic and branch vessel atherosclerosis. Reproductive: The uterus and ovaries appear normal. No adnexal mass. Other: No evidence of abdominal wall mass or hernia. No ascites. Musculoskeletal: No acute or significant osseous findings. T11 Schmorl's node and lower lumbar spondylosis noted. IMPRESSION: 1. Markedly distended urinary bladder, suggesting possible neurogenic bladder versus bladder outlet obstruction.  Associated bilateral hydronephrosis and hydroureter, likely secondary to the bladder distention. Consider Foley catheter placement. Follow-up renal ultrasound recommended after voiding or bladder catheterization to document decompression of the bilateral collecting systems. No urinary tract calculi seen. 2. Gallbladder hydrops with dependent stones, similar to previous studies. No gallbladder wall thickening or pericholecystic inflammation identified. 3. Patchy left-greater-than-right basilar airspace opacities suspicious for aspiration. Chest radiographs are pending. 4. Coronary and Aortic Atherosclerosis (ICD10-I70.0). Electronically Signed   By: Richardean Sale M.D.   On: 04/22/2018 14:28   Dg Chest 2 View  Result Date: 04/22/2018 CLINICAL DATA:  Lethargy and nausea EXAM: CHEST - 2 VIEW COMPARISON:  April 11, 2018 FINDINGS: There is atelectatic change in the left base. The lungs elsewhere are clear. Heart size and pulmonary vascularity are normal. No adenopathy. No bone lesions. IMPRESSION: Left base atelectasis. Earliest changes of pneumonia in this area cannot be excluded. Lungs elsewhere clear. Heart size normal. No evident adenopathy. Electronically Signed   By: Lowella Grip III M.D.  On: 04/22/2018 14:29   EKG: Independently reviewed. Showed Sinus Tachycardia but no Twave Tenting. Rate was 107 and no evidence of ST Elevation on my interpretation  Assessment/Plan Active Problems:   PVD (peripheral vascular disease) (HCC)   Diabetic neuropathy (HCC)   Renal failure (ARF), acute on chronic (HCC)   Essential hypertension   Normocytic anemia   Metabolic acidosis, normal anion gap (NAG)   Hyperkalemia   Aspiration into airway   Generalized weakness   Lethargy   Poor appetite   Failure to thrive in adult  Generalized Weakness, Increased Lethargy, Poor Appetite and Failure to Thrive in an Adult -Admit to Inpatient Telemetry -Given 1 L normal saline bolus along with 1 amp of sodium  bicarb in the ED -Continue with normal saline 75 mL's per hour for next day -PT/OT to evaluate and treat -Obtain nutritionist consultation -Recently [place] last week -Treat any infection and likely patient has an aspiration pneumonia so is placed on IV Zosyn -Follow Blood Cx -Follow Urinalysis and Urine Cx -May Try Appetite stimulants   AKI on CKD Stage 3 -Likley secondary obstructive or bladder outlet obstructive uropathy -Patient's Baseline Cr is around 1.7-2 -Admission BUN/Cr was 100/8.30 -Avoid Nephrotoxic Medications -CT Scan of the Abdomen and Pelvis showed Markedly distended urinary bladder, suggesting possible neurogenic bladder versus bladder outlet obstruction. Associated bilateral hydronephrosis and hydroureter, likely secondary to the bladder distention. No urinary tract calculi seen. -Obtain Renal U/S to see if Hydronephrosis is improving -EDP Discussed Case with Urology and Nephrology who both recommended placing Foley and Observing for Cr Improvement -If patient fails to Improve with Foley Decompression will consult Nephrology formally.   Likely Aspiration PNA -Given IV Vancomycin and Cefepime in the ED -CT Scan of the Abdomen revealed the lower chest finding of Patchy left-greater-than-right basilar airspace opacities suspicious for aspiration -CXR showed Left base atelectasis. Earliest changes of pneumonia in this area cannot be excluded. Lungs elsewhere clear. Heart size normal. No evident adenopathy. 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 Inpatient status is judged to be reasonable and necessary in order to provide the required intensity of service to ensure the patient's safety. The patient's presenting symptoms, physical exam findings, and initial radiographic and laboratory data in the context of their chronic comorbidities is felt to place them at high risk for further clinical deterioration. Furthermore, it is not anticipated that the patient will be medically stable for discharge from the hospital within 2 midnights of admission. The  following factors support the patient status of inpatient.   " The patient's presenting symptoms include poor po intake and Lower Abdominal Pain. " The worrisome physical exam findings include mild tenderness over the bladder. " The initial radiographic and laboratory data are worrisome because of concern for bladder outlet obstruction. " The chronic co-morbidities include HTN, DM2, HLD. PVD, Anemia.  * I certify that at the point of admission it is my clinical judgment that the patient will require inpatient hospital care spanning beyond 2 midnights from the point of admission due to high intensity of service, high risk for further deterioration and high frequency of surveillance required.Kerney Elbe, D.O. [facility] Pager [phone]  If  7PM-7AM, please contact night-coverage [email or web address] Password [facility]  04/22/2018, 6:21 PM

## 2018-04-22 NOTE — Progress Notes (Signed)
Pharmacy Antibiotic Note  Stephanie Olson is a 77 y.o. female admitted on 04/22/2018 with aspiration pneumonia.  Pharmacy has been consulted for Zosyn dosing.  Plan: 1) Per current CrCl, will start Zosyn 2.25g IV q8 2) Monitor renal function closely  Height: 5\' 9"  (175.3 cm) Weight: 135 lb (61.2 kg) IBW/kg (Calculated) : 66.2  Temp (24hrs), Avg:98.3 F (36.8 C), Min:98.3 F (36.8 C), Max:98.3 F (36.8 C)  Recent Labs  Lab 04/22/18 1453 04/22/18 1501 04/22/18 1502  WBC 12.7*  --   --   CREATININE 8.15*  --  8.30*  LATICACIDVEN  --  0.55  --     Estimated Creatinine Clearance: 5.7 mL/min (A) (by C-G formula based on SCr of 8.3 mg/dL (H)).    No Known Allergies   Thank you for allowing pharmacy to be a part of this patient's care.  [name] 04/22/2018 5:01 PM

## 2018-04-22 NOTE — ED Triage Notes (Signed)
Patient arrives via EMS from [facility] with c/o increased weakness, lethargy, and refusing to eat or drink. Patient denies chest pain. Patient reports nausea.   Bp: 112/72 HR: 108 Spo2: 98 Cbg: 89

## 2018-04-22 NOTE — ED Notes (Signed)
ED TO INPATIENT HANDOFF REPORT  Name/Age/Gender Stephanie Olson 77 y.o. female  Code Status Code Status History    Date Active Date Inactive Code Status Order ID Comments User Context   01/29/2015 2016 01/30/2015 2121 Full Code [id number]  Serafina Mitchell, MD Inpatient   01/29/2015 1733 01/29/2015 2016 Full Code [id number]  Angelia Mould, MD Inpatient    Advance Directive Documentation     Most Recent Value  Type of Advance Directive  Healthcare Power of Attorney  Pre-existing out of facility DNR order (yellow form or pink MOST form)  -  "MOST" Form in Place?  -      Home/SNF/Other Skilled nursing facility  Chief Complaint weakness  Level of Care/Admitting Diagnosis ED Disposition    ED Disposition Condition [place]: [place] [100102]  Level of Care: Telemetry [5]  Admit to tele based on following criteria: Monitor QTC interval  Admit to tele based on following criteria: Monitor for Ischemic changes  Diagnosis: Renal failure (ARF), acute on chronic [facility]) [194174]  Admitting Physician: [place], [place] [[id number]]  Attending Physician: Raiford Noble LATIF [[id number]]  Estimated length of stay: past midnight tomorrow  Certification:: I certify this patient will need inpatient services for at least 2 midnights  PT Class (Do Not Modify): Inpatient [101]  PT Acc Code (Do Not Modify): Private [1]       Medical History Past Medical History:  Diagnosis Date  . Arthritis   . Diabetes mellitus without complication ([place])   . Diabetic foot ulcers (HCC)    RIGHT   . Hypertension   . Peripheral vascular disease ([place])     Allergies No Known Allergies  IV Location/Drains/Wounds Patient Lines/Drains/Airways Status   Active Line/Drains/Airways    Name:   Placement date:   Placement time:   Site:   Days:   Peripheral IV 04/22/18 Left Arm   04/22/18    1450    Arm   less than 1   Peripheral IV 04/22/18 Right Hand   04/22/18     1612    Hand   less than 1   Urethral Catheter  RN Double-lumen 16 Fr.   04/22/18    1536    Double-lumen   less than 1          Labs/Imaging Results for orders placed or performed during the hospital encounter of 04/22/18 (from the past 48 hour(s))  Urinalysis, Routine w reflex microscopic     Status: Abnormal   Collection Time: 04/22/18  1:29 PM  Result Value Ref Range   Color, Urine YELLOW YELLOW   APPearance CLEAR CLEAR   Specific Gravity, Urine 1.013 1.005 - 1.030   pH 5.0 5.0 - 8.0   Glucose, UA NEGATIVE NEGATIVE mg/dL   Hgb urine dipstick MODERATE (A) NEGATIVE   Bilirubin Urine NEGATIVE NEGATIVE   Ketones, ur NEGATIVE NEGATIVE mg/dL   Protein, ur 100 (A) NEGATIVE mg/dL   Nitrite NEGATIVE NEGATIVE   Leukocytes, UA LARGE (A) NEGATIVE   RBC / HPF 11-20 0 - 5 RBC/hpf   WBC, UA >50 (H) 0 - 5 WBC/hpf   Bacteria, UA FEW (A) NONE SEEN   Squamous Epithelial / LPF 0-5 0 - 5   Renal Epithelial 11    Oval Fat Body PRESENT     Comment: Performed at [facility], [place] [address] [place], [place] [address]  CBC with Differential/Platelet     Status: Abnormal  Collection Time: 04/22/18  2:53 PM  Result Value Ref Range   WBC 12.7 (H) 4.0 - 10.5 K/uL   RBC 3.74 (L) 3.87 - 5.11 MIL/uL   Hemoglobin 10.2 (L) 12.0 - 15.0 g/dL   HCT 32.6 (L) 36.0 - 46.0 %   MCV 87.2 78.0 - 100.0 fL   MCH 27.3 26.0 - 34.0 pg   MCHC 31.3 30.0 - 36.0 g/dL   RDW 18.7 (H) 11.5 - 15.5 %   Platelets 455 (H) 150 - 400 K/uL   Neutrophils Relative % 69 %   Neutro Abs 8.7 (H) 1.7 - 7.7 K/uL   Lymphocytes Relative 22 %   Lymphs Abs 2.8 0.7 - 4.0 K/uL   Monocytes Relative 9 %   Monocytes Absolute 1.1 (H) 0.1 - 1.0 K/uL   Eosinophils Relative 0 %   Eosinophils Absolute 0.0 0.0 - 0.7 K/uL   Basophils Relative 0 %   Basophils Absolute 0.0 0.0 - 0.1 K/uL    Comment: Performed at [facility], [place] [address], [place], [place] [address]  Comprehensive metabolic panel      Status: Abnormal   Collection Time: 04/22/18  2:53 PM  Result Value Ref Range   Sodium 136 135 - 145 mmol/L   Potassium 5.8 (H) 3.5 - 5.1 mmol/L   Chloride 106 98 - 111 mmol/L   CO2 15 (L) 22 - 32 mmol/L   Glucose, Bld 92 70 - 99 mg/dL   BUN 109 (H) 8 - 23 mg/dL    Comment: RESULTS CONFIRMED BY MANUAL DILUTION   Creatinine, Ser 8.15 (H) 0.44 - 1.00 mg/dL   Calcium 8.2 (L) 8.9 - 10.3 mg/dL   Total Protein 6.4 (L) 6.5 - 8.1 g/dL   Albumin 2.6 (L) 3.5 - 5.0 g/dL   AST 15 15 - 41 U/L   ALT 13 0 - 44 U/L   Alkaline Phosphatase 80 38 - 126 U/L   Total Bilirubin 0.6 0.3 - 1.2 mg/dL   GFR calc non Af Amer 4 (L) >60 mL/min   GFR calc Af Amer 5 (L) >60 mL/min    Comment: (NOTE) The eGFR has been calculated using the CKD EPI equation. This calculation has not been validated in all clinical situations. eGFR's persistently <60 mL/min signify possible Chronic Kidney Disease.    Anion gap 15 5 - 15    Comment: Performed at [facility], [place] [address] [place], [place] [address]  I-stat troponin, ED     Status: None   Collection Time: 04/22/18  3:00 PM  Result Value Ref Range   Troponin i, poc 0.02 0.00 - 0.08 ng/mL   Comment 3            Comment: Due to the release kinetics of cTnI, a negative result within the first hours of the onset of symptoms does not rule out myocardial infarction with certainty. If myocardial infarction is still suspected, repeat the test at appropriate intervals.   I-Stat CG4 Lactic Acid, ED     Status: None   Collection Time: 04/22/18  3:01 PM  Result Value Ref Range   Lactic Acid, Venous 0.55 0.5 - 1.9 mmol/L  I-stat chem 8, ed     Status: Abnormal   Collection Time: 04/22/18  3:02 PM  Result Value Ref Range   Sodium 133 (L) 135 - 145 mmol/L   Potassium 5.7 (H) 3.5 - 5.1 mmol/L   Chloride 108 98 - 111 mmol/L   BUN 100 (  H) 8 - 23 mg/dL   Creatinine, Ser 8.30 (H) 0.44 - 1.00 mg/dL   Glucose, Bld 90 70 - 99 mg/dL   Calcium, Ion 1.05  (L) 1.15 - 1.40 mmol/L   TCO2 16 (L) 22 - 32 mmol/L   Hemoglobin 10.9 (L) 12.0 - 15.0 g/dL   HCT 32.0 (L) 36.0 - 46.0 %   Ct Abdomen Pelvis Wo Contrast  Result Date: 04/22/2018 CLINICAL DATA:  Weakness, lethargy and decreased appetite. Nausea. Remote abdominal surgery, unspecified. EXAM: CT ABDOMEN AND PELVIS WITHOUT CONTRAST TECHNIQUE: Multidetector CT imaging of the abdomen and pelvis was performed following the standard protocol without IV contrast. COMPARISON:  CT 03/03/2010. FINDINGS: Lower chest: There are streaky left of the right lower lobe airspace opacities which could reflect atelectasis, although are suspicious for possible aspiration. There is a small left pleural effusion. No significant pericardial fluid. There is aortic and coronary artery atherosclerosis. Hepatobiliary: No focal hepatic abnormality is identified on noncontrast imaging. There is no significant biliary dilatation. The gallbladder is significantly distended, similar to the previous study. There is no evidence gallbladder wall thickening or surrounding inflammation. Multiple dependent small gallstones are present in the gallbladder lumen. Pancreas: Atrophied without focal abnormality, ductal dilatation or surrounding inflammation. Spleen: Normal in size. Small calcified granuloma. No other focal abnormality. Adrenals/Urinary Tract: Stable mild thickening of both adrenal glands without focal nodule. Both kidneys demonstrate mild cortical thinning and at least moderate hydronephrosis and hydroureter, worse on the right. There is mild asymmetric perinephric soft tissue stranding on the right. No ureteral calculus or focal mass lesion identified. The urinary bladder is markedly distended without apparent focal abnormality. Stomach/Bowel: No evidence of bowel wall thickening, distention or surrounding inflammatory change. Mild proximal colonic diverticulosis. The appendix appears normal. Vascular/Lymphatic: There are no enlarged  abdominal or pelvic lymph nodes. Aortic and branch vessel atherosclerosis. Reproductive: The uterus and ovaries appear normal. No adnexal mass. Other: No evidence of abdominal wall mass or hernia. No ascites. Musculoskeletal: No acute or significant osseous findings. T11 Schmorl's node and lower lumbar spondylosis noted. IMPRESSION: 1. Markedly distended urinary bladder, suggesting possible neurogenic bladder versus bladder outlet obstruction. Associated bilateral hydronephrosis and hydroureter, likely secondary to the bladder distention. Consider Foley catheter placement. Follow-up renal ultrasound recommended after voiding or bladder catheterization to document decompression of the bilateral collecting systems. No urinary tract calculi seen. 2. Gallbladder hydrops with dependent stones, similar to previous studies. No gallbladder wall thickening or pericholecystic inflammation identified. 3. Patchy left-greater-than-right basilar airspace opacities suspicious for aspiration. Chest radiographs are pending. 4. Coronary and Aortic Atherosclerosis (ICD10-I70.0). Electronically Signed   By: Richardean Sale M.D.   On: 04/22/2018 14:28   Dg Chest 2 View  Result Date: 04/22/2018 CLINICAL DATA:  Lethargy and nausea EXAM: CHEST - 2 VIEW COMPARISON:  April 11, 2018 FINDINGS: There is atelectatic change in the left base. The lungs elsewhere are clear. Heart size and pulmonary vascularity are normal. No adenopathy. No bone lesions. IMPRESSION: Left base atelectasis. Earliest changes of pneumonia in this area cannot be excluded. Lungs elsewhere clear. Heart size normal. No evident adenopathy. Electronically Signed   By: Lowella Grip III M.D.   On: 04/22/2018 14:29    Pending Labs Unresulted Labs (From admission, onward)    Start     Ordered   04/22/18 1439  Blood culture (routine x 2)  BLOOD CULTURE X 2,   STAT     04/22/18 1438   04/22/18 1329  Urine culture  STAT,  STAT     04/22/18 1328   Signed and Held   HIV antibody (Routine Screening)  Once,   R     Signed and Held   Signed and Held  Culture, sputum-assessment  Once,   R     Signed and Held   Signed and Held  Gram stain  Once,   R     Signed and Held   Signed and Held  Strep pneumoniae urinary antigen  Once,   R     Signed and Held   Visual merchandiser and Held  Magnesium  Add-on,   R     Signed and Held   Signed and Held  Phosphorus  Add-on,   R     Signed and Held   Signed and Held  TSH  Tomorrow morning,   R     Signed and Held   Signed and Held  Hemoglobin A1c  Tomorrow morning,   R     Signed and Held   Signed and Held  Comprehensive metabolic panel  Tomorrow morning,   R     Signed and Held   Signed and Held  CBC  Tomorrow morning,   R     Signed and Held   Signed and Held  Legionella Pneumophila Serogp 1 Ur Ag  Once,   R     Signed and Held          Vitals/Pain Today's Vitals   04/22/18 1340 04/22/18 1341 04/22/18 1600 04/22/18 1700  BP: 108/72  131/71 119/74  Pulse: (!) 109  (!) 104 (!) 104  Resp: _0 Temp: 98.3 F (36.8 C)     TempSrc: Oral     SpO2: 100%  98% 99%  Weight:  61.2 kg    Height:  5' 9" (1.753 m)    PainSc:  0-No pain      Isolation Precautions No active isolations  Medications Medications  insulin aspart (novoLOG) injection 0-9 Units (has no administration in time range)  piperacillin-tazobactam (ZOSYN) IVPB 2.25 g (has no administration in time range)  sodium chloride 0.9 % bolus 1,000 mL (0 mLs Intravenous Stopped 04/22/18 1549)  ondansetron (ZOFRAN) injection 4 mg (4 mg Intravenous Given 04/22/18 1500)  vancomycin (VANCOCIN) IVPB 1000 mg/200 mL premix (0 mg Intravenous Stopped 04/22/18 1655)  ceFEPIme (MAXIPIME) 1 g in sodium chloride 0.9 % 100 mL IVPB (0 g Intravenous Stopped 04/22/18 1725)  sodium bicarbonate injection 50 mEq (50 mEq Intravenous Given 04/22/18 1544)  calcium gluconate 1 g in sodium chloride 0.9 % 100 mL IVPB (0 g Intravenous Stopped 04/22/18 1635)    Mobility walks with  device \

## 2018-04-22 NOTE — ED Provider Notes (Signed)
[facility] DEPT Provider Note   CSN: [id number] Arrival date & time: 04/22/18  1323     History   Chief Complaint Chief Complaint  Patient presents with  . Weakness  . Fatigue    HPI Stephanie Olson is a 77 y.o. female history of diabetes, hypertension, here presenting with weakness, failure to thrive.  Patient currently resides at [facility].  Patient was noted to have decrease intake and states that she has been nauseated and refusing to eat or drink for the last week or so.  Patient states that she had one episode of vomiting several days ago.  Patient was seen in the ED about 10 days ago after a fall and had a unremarkable CT head and labs showed baseline renal failure with a creatinine of 1.7.  Urinalysis at that time was unremarkable.  Patient was noted to have poor appetite but no fevers. Denies any further falls since presenting to the ED last week.   The history is provided by the patient and the EMS personnel.   Level V caveat- condition of patient   Past Medical History:  Diagnosis Date  . Arthritis   . Diabetes mellitus without complication ([place])   . Diabetic foot ulcers (HCC)    RIGHT   . Hypertension   . Peripheral vascular disease [facility])     Patient Active Problem List   Diagnosis Date Noted  . Diabetic neuropathy ([place]) 11/21/2015  . 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/2015  . Diabetic ulcer of foot, limited to breakdown of skin ([place]) 11/21/2015  . Foot pain, left 02/14/2015  . PVD (peripheral vascular disease) ([place]) 01/29/2015    Past Surgical History:  Procedure Laterality Date  . ABDOMINAL AORTAGRAM  01/29/2015  . ATHERECTOMY Right 01/29/2015   FEMORAL ARTERY   . BALLOON ANGIOPLASTY, ARTERY Right 01/29/2015   RT FEMORAL   . FOOT SURGERY    . PERIPHERAL VASCULAR CATHETERIZATION N/A 01/29/2015   Procedure: Abdominal Aortogram w/Lower Extremity;  Surgeon: Serafina Mitchell, MD;  Location: [facility] CV LAB;  Service:  Cardiovascular;  Laterality: N/A;     OB History   None      Home Medications    Prior to Admission medications   Medication Sig Start Date End Date Taking? Authorizing Provider  acetaminophen (TYLENOL) 500 MG tablet Take 500 mg by mouth every 6 (six) hours as needed.   Yes [provider]  amLODipine (NORVASC) 5 MG tablet Take 5 mg by mouth daily. 11/02/14  Yes [provider]  BISACODYL PO Take 10 mg by mouth as needed (constipation).    Yes [provider]  cyclobenzaprine (FLEXERIL) 5 MG tablet Take 5 mg by mouth 3 (three) times daily as needed for muscle spasms.   Yes [provider]  ENSURE PLUS (ENSURE PLUS) LIQD Take 237 mLs by mouth 2 (two) times daily.   Yes [provider]  HYDROcodone-acetaminophen (NORCO/VICODIN) 5-325 MG tablet Take 1 tablet by mouth every 6 (six) hours as needed for moderate pain.   Yes [provider]  Melatonin 3 MG TABS Take 3 mg by mouth at bedtime.    Yes [provider]  mirtazapine (REMERON) 7.5 MG tablet Take 7.5 mg by mouth at bedtime.   Yes [provider]  promethazine (PHENERGAN) 25 MG tablet Take 25 mg by mouth every 6 (six) hours as needed for nausea or vomiting.   Yes [provider]  sertraline (ZOLOFT) 50 MG tablet Take 75 mg  by mouth daily.    Yes [provider]  clopidogrel (PLAVIX) 75 MG tablet TAKE 1 TABLET (75 MG TOTAL) BY MOUTH DAILY. Patient not taking: Reported on 04/11/2018 01/26/17   Angelia Mould, MD    Family History Family History  Problem Relation Age of Onset  . Hypertension Mother   . Hypertension Father     Social History Social History   Tobacco Use  . Smoking status: Current Some Day Smoker    Types: Cigarettes    Last attempt to quit: 02/13/2014    Years since quitting: 4.1  . Smokeless tobacco: Never Used  . Tobacco comment: " I quit smoking along time ago "  Substance Use Topics  . Alcohol use: No     Alcohol/week: 0.0 standard drinks  . Drug use: No     Allergies   Patient has no known allergies.   Review of Systems Review of Systems  Gastrointestinal: Positive for nausea.  Neurological: Positive for weakness.  All other systems reviewed and are negative.    Physical Exam Updated Vital Signs BP 108/72 (BP Location: Right Arm)   Pulse (!) 109   Temp 98.3 F (36.8 C) (Oral)   Resp 15   Ht 5\' 9"  (1.753 m)   Wt 61.2 kg   SpO2 100%   BMI 19.94 kg/m   Physical Exam  Constitutional:  Chronically ill   HENT:  Head: Normocephalic.  MM dry   Eyes: Pupils are equal, round, and reactive to light. EOM are normal.  Neck: Normal range of motion. Neck supple.  Cardiovascular: Normal rate, regular rhythm and normal heart sounds.  Pulmonary/Chest: Effort normal and breath sounds normal. No stridor. No respiratory distress. She has no wheezes.  Abdominal: Soft. Bowel sounds are normal. She exhibits no distension. There is no tenderness.  Musculoskeletal: Normal range of motion.  Neurological: She is alert.  Demented, moving all extremities. Nonfocal neuro exam   Skin: Skin is warm.  Psychiatric: She has a normal mood and affect.  Nursing note and vitals reviewed.    ED Treatments / Results  Labs (all labs ordered are listed, but only abnormal results are displayed) Labs Reviewed  CBC WITH DIFFERENTIAL/PLATELET - Abnormal; Notable for the following components:      Result Value   WBC 12.7 (*)    RBC 3.74 (*)    Hemoglobin 10.2 (*)    HCT 32.6 (*)    RDW 18.7 (*)    Platelets 455 (*)    Neutro Abs 8.7 (*)    Monocytes Absolute 1.1 (*)    All other components within normal limits  I-STAT CHEM 8, ED - Abnormal; Notable for the following components:   Sodium 133 (*)    Potassium 5.7 (*)    BUN 100 (*)    Creatinine, Ser 8.30 (*)    Calcium, Ion 1.05 (*)    TCO2 16 (*)    Hemoglobin 10.9 (*)    HCT 32.0 (*)    All other components within normal limits  URINE  CULTURE  CULTURE, BLOOD (ROUTINE X 2)  CULTURE, BLOOD (ROUTINE X 2)  COMPREHENSIVE METABOLIC PANEL  URINALYSIS, ROUTINE W REFLEX MICROSCOPIC  I-STAT TROPONIN, ED  I-STAT CG4 LACTIC ACID, ED    EKG None  Radiology Ct Abdomen Pelvis Wo Contrast  Result Date: 04/22/2018 CLINICAL DATA:  Weakness, lethargy and decreased appetite. Nausea. Remote abdominal surgery, unspecified. EXAM: CT ABDOMEN AND PELVIS WITHOUT CONTRAST TECHNIQUE: Multidetector CT imaging of the abdomen and  pelvis was performed following the standard protocol without IV contrast. COMPARISON:  CT 03/03/2010. FINDINGS: Lower chest: There are streaky left of the right lower lobe airspace opacities which could reflect atelectasis, although are suspicious for possible aspiration. There is a small left pleural effusion. No significant pericardial fluid. There is aortic and coronary artery atherosclerosis. Hepatobiliary: No focal hepatic abnormality is identified on noncontrast imaging. There is no significant biliary dilatation. The gallbladder is significantly distended, similar to the previous study. There is no evidence gallbladder wall thickening or surrounding inflammation. Multiple dependent small gallstones are present in the gallbladder lumen. Pancreas: Atrophied without focal abnormality, ductal dilatation or surrounding inflammation. Spleen: Normal in size. Small calcified granuloma. No other focal abnormality. Adrenals/Urinary Tract: Stable mild thickening of both adrenal glands without focal nodule. Both kidneys demonstrate mild cortical thinning and at least moderate hydronephrosis and hydroureter, worse on the right. There is mild asymmetric perinephric soft tissue stranding on the right. No ureteral calculus or focal mass lesion identified. The urinary bladder is markedly distended without apparent focal abnormality. Stomach/Bowel: No evidence of bowel wall thickening, distention or surrounding inflammatory change. Mild proximal  colonic diverticulosis. The appendix appears normal. Vascular/Lymphatic: There are no enlarged abdominal or pelvic lymph nodes. Aortic and branch vessel atherosclerosis. Reproductive: The uterus and ovaries appear normal. No adnexal mass. Other: No evidence of abdominal wall mass or hernia. No ascites. Musculoskeletal: No acute or significant osseous findings. T11 Schmorl's node and lower lumbar spondylosis noted. IMPRESSION: 1. Markedly distended urinary bladder, suggesting possible neurogenic bladder versus bladder outlet obstruction. Associated bilateral hydronephrosis and hydroureter, likely secondary to the bladder distention. Consider Foley catheter placement. Follow-up renal ultrasound recommended after voiding or bladder catheterization to document decompression of the bilateral collecting systems. No urinary tract calculi seen. 2. Gallbladder hydrops with dependent stones, similar to previous studies. No gallbladder wall thickening or pericholecystic inflammation identified. 3. Patchy left-greater-than-right basilar airspace opacities suspicious for aspiration. Chest radiographs are pending. 4. Coronary and Aortic Atherosclerosis (ICD10-I70.0). Electronically Signed   By: Richardean Sale M.D.   On: 04/22/2018 14:28   Dg Chest 2 View  Result Date: 04/22/2018 CLINICAL DATA:  Lethargy and nausea EXAM: CHEST - 2 VIEW COMPARISON:  April 11, 2018 FINDINGS: There is atelectatic change in the left base. The lungs elsewhere are clear. Heart size and pulmonary vascularity are normal. No adenopathy. No bone lesions. IMPRESSION: Left base atelectasis. Earliest changes of pneumonia in this area cannot be excluded. Lungs elsewhere clear. Heart size normal. No evident adenopathy. Electronically Signed   By: Lowella Grip III M.D.   On: 04/22/2018 14:29    Procedures Procedures (including critical care time)  Angiocath insertion Performed by: Wandra Arthurs  Consent: Verbal consent obtained. Risks and  benefits: risks, benefits and alternatives were discussed Time out: Immediately prior to procedure a "time out" was called to verify the correct patient, procedure, equipment, support staff and site/side marked as required.  Preparation: Patient was prepped and draped in the usual sterile fashion.  Vein Location: L hand  Gauge: 20 gauge   Normal blood return and flush without difficulty Patient tolerance: Patient tolerated the procedure well with no immediate complications.  CRITICAL CARE Performed by: Wandra Arthurs   Total critical care time: 30 minutes  Critical care time was exclusive of separately billable procedures and treating other patients.  Critical care was necessary to treat or prevent imminent or life-threatening deterioration.  Critical care was time spent personally by me on the following activities: development  of treatment plan with patient and/or surrogate as well as nursing, discussions with consultants, evaluation of patient's response to treatment, examination of patient, obtaining history from patient or surrogate, ordering and performing treatments and interventions, ordering and review of laboratory studies, ordering and review of radiographic studies, pulse oximetry and re-evaluation of patient's condition.     Medications Ordered in ED Medications  sodium chloride 0.9 % bolus 1,000 mL (1,000 mLs Intravenous New Bag/Given 04/22/18 1451)  ondansetron (ZOFRAN) injection 4 mg (has no administration in time range)  vancomycin (VANCOCIN) IVPB 1000 mg/200 mL premix (has no administration in time range)  ceFEPIme (MAXIPIME) 1 g in sodium chloride 0.9 % 100 mL IVPB (has no administration in time range)     Initial Impression / Assessment and Plan / ED Course  I have reviewed the triage vital signs and the nursing notes.  Pertinent labs & imaging results that were available during my care of the patient were reviewed by me and considered in my medical decision  making (see chart for details).     Stephanie Olson is a 77 y.o. female here with nausea, weakness, failure to thrive. Will repeat labs, UA. Will get CT ab/pel.   3:15 pm WBC 13. K 5.7. Cr 8.3. CT showed bilateral pneumonias as well has distended bladder and bilateral hydro. 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 I called Dr. Tresa Moore from urology, who agreed with foley and no urologic intervention currently. Nurse placed foley and around 1 L came out. I consulted nephrology, Dr. Jimmy Footman, who recommend foley and hydration and transfer to [facility] only if renal failure doesn't improve with IVF. Of note, patient has pneumonia and I ordered vanc/cefepime per HCAP protocol. Held 30 cc/kg bolus since she has new onset renal failure. WBC nl, lactate nl. 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    Final Clinical Impressions(s) / ED Diagnoses   Final diagnoses:  None    ED Discharge Orders    None       Drenda Freeze, MD 04/22/18 1527

## 2018-04-23 ENCOUNTER — Inpatient Hospital Stay (HOSPITAL_COMMUNITY): Payer: Medicare Other

## 2018-04-23 ENCOUNTER — Encounter (HOSPITAL_COMMUNITY): Payer: Self-pay | Admitting: *Deleted

## 2018-04-23 DIAGNOSIS — R338 Other retention of urine: Secondary | ICD-10-CM

## 2018-04-23 DIAGNOSIS — L8990 Pressure ulcer of unspecified site, unspecified stage: Secondary | ICD-10-CM

## 2018-04-23 DIAGNOSIS — N179 Acute kidney failure, unspecified: Secondary | ICD-10-CM

## 2018-04-23 DIAGNOSIS — R339 Retention of urine, unspecified: Secondary | ICD-10-CM

## 2018-04-23 LAB — CBC
HEMATOCRIT: 34.1 % — AB (ref 36.0–46.0)
HEMOGLOBIN: 10.8 g/dL — AB (ref 12.0–15.0)
MCH: 27.5 pg (ref 26.0–34.0)
MCHC: 31.7 g/dL (ref 30.0–36.0)
MCV: 86.8 fL (ref 78.0–100.0)
Platelets: 408 10*3/uL — ABNORMAL HIGH (ref 150–400)
RBC: 3.93 MIL/uL (ref 3.87–5.11)
RDW: 18.6 % — ABNORMAL HIGH (ref 11.5–15.5)
WBC: 9.9 10*3/uL (ref 4.0–10.5)

## 2018-04-23 LAB — BLOOD CULTURE ID PANEL (REFLEXED)
Acinetobacter baumannii: NOT DETECTED
Candida albicans: NOT DETECTED
Candida glabrata: NOT DETECTED
Candida krusei: NOT DETECTED
Candida parapsilosis: NOT DETECTED
Candida tropicalis: NOT DETECTED
ENTEROCOCCUS SPECIES: NOT DETECTED
Enterobacter cloacae complex: NOT DETECTED
Enterobacteriaceae species: NOT DETECTED
Escherichia coli: NOT DETECTED
Haemophilus influenzae: NOT DETECTED
Klebsiella oxytoca: NOT DETECTED
Klebsiella pneumoniae: NOT DETECTED
LISTERIA MONOCYTOGENES: NOT DETECTED
Methicillin resistance: DETECTED — AB
NEISSERIA MENINGITIDIS: NOT DETECTED
PROTEUS SPECIES: NOT DETECTED
Pseudomonas aeruginosa: NOT DETECTED
SERRATIA MARCESCENS: NOT DETECTED
STAPHYLOCOCCUS AUREUS BCID: NOT DETECTED
STAPHYLOCOCCUS SPECIES: DETECTED — AB
STREPTOCOCCUS AGALACTIAE: NOT DETECTED
STREPTOCOCCUS SPECIES: NOT DETECTED
Streptococcus pneumoniae: NOT DETECTED
Streptococcus pyogenes: NOT DETECTED

## 2018-04-23 LAB — COMPREHENSIVE METABOLIC PANEL
ALBUMIN: 2.7 g/dL — AB (ref 3.5–5.0)
ALT: 14 U/L (ref 0–44)
ANION GAP: 9 (ref 5–15)
AST: 15 U/L (ref 15–41)
Alkaline Phosphatase: 82 U/L (ref 38–126)
BILIRUBIN TOTAL: 0.5 mg/dL (ref 0.3–1.2)
BUN: 90 mg/dL — ABNORMAL HIGH (ref 8–23)
CHLORIDE: 107 mmol/L (ref 98–111)
CO2: 21 mmol/L — ABNORMAL LOW (ref 22–32)
Calcium: 8.3 mg/dL — ABNORMAL LOW (ref 8.9–10.3)
Creatinine, Ser: 5.92 mg/dL — ABNORMAL HIGH (ref 0.44–1.00)
GFR calc Af Amer: 7 mL/min — ABNORMAL LOW (ref >60)
GFR, EST NON AFRICAN AMERICAN: 6 mL/min — AB (ref >60)
GLUCOSE: 152 mg/dL — AB (ref 70–99)
Potassium: 5.6 mmol/L — ABNORMAL HIGH (ref 3.5–5.1)
Sodium: 137 mmol/L (ref 135–145)
TOTAL PROTEIN: 6.5 g/dL (ref 6.5–8.1)

## 2018-04-23 LAB — HEMOGLOBIN A1C
Hgb A1c MFr Bld: 6.8 % — ABNORMAL HIGH (ref 4.8–5.6)
MEAN PLASMA GLUCOSE: 148.46 mg/dL

## 2018-04-23 LAB — TSH: TSH: 0.945 u[IU]/mL (ref 0.350–4.500)

## 2018-04-23 LAB — LEGIONELLA PNEUMOPHILA SEROGP 1 UR AG: L. pneumophila Serogp 1 Ur Ag: NEGATIVE

## 2018-04-23 LAB — GLUCOSE, CAPILLARY
GLUCOSE-CAPILLARY: 99 mg/dL (ref 70–99)
GLUCOSE-CAPILLARY: 141 mg/dL — AB (ref 70–99)
GLUCOSE-CAPILLARY: 99 mg/dL (ref 70–99)
Glucose-Capillary: 183 mg/dL — ABNORMAL HIGH (ref 70–99)
Glucose-Capillary: 186 mg/dL — ABNORMAL HIGH (ref 70–99)

## 2018-04-23 MED ORDER — CLOPIDOGREL BISULFATE 75 MG PO TABS
75.0000 mg | ORAL_TABLET | Freq: Every day | ORAL | Status: DC
  Administered 2018-04-23 – 2018-04-24 (×2): 75 mg via ORAL
  Filled 2018-04-23 (×3): qty 1

## 2018-04-23 MED ORDER — SODIUM POLYSTYRENE SULFONATE 15 GM/60ML PO SUSP
30.0000 g | Freq: Once | ORAL | Status: AC
  Administered 2018-04-23: 30 g via ORAL
  Filled 2018-04-23 (×2): qty 120

## 2018-04-23 NOTE — Progress Notes (Signed)
PHARMACY - PHYSICIAN COMMUNICATION CRITICAL VALUE ALERT - BLOOD CULTURE IDENTIFICATION (BCID)  Results for orders placed or performed during the hospital encounter of 04/22/18  Blood Culture ID Panel (Reflexed) (Collected: 04/22/2018  3:35 PM)  Result Value Ref Range   Enterococcus species NOT DETECTED NOT DETECTED   Listeria monocytogenes NOT DETECTED NOT DETECTED   Staphylococcus species DETECTED (A) NOT DETECTED   Staphylococcus aureus NOT DETECTED NOT DETECTED   Methicillin resistance DETECTED (A) NOT DETECTED   Streptococcus species NOT DETECTED NOT DETECTED   Streptococcus agalactiae NOT DETECTED NOT DETECTED   Streptococcus pneumoniae NOT DETECTED NOT DETECTED   Streptococcus pyogenes NOT DETECTED NOT DETECTED   Acinetobacter baumannii NOT DETECTED NOT DETECTED   Enterobacteriaceae species NOT DETECTED NOT DETECTED   Enterobacter cloacae complex NOT DETECTED NOT DETECTED   Escherichia coli NOT DETECTED NOT DETECTED   Klebsiella oxytoca NOT DETECTED NOT DETECTED   Klebsiella pneumoniae NOT DETECTED NOT DETECTED   Proteus species NOT DETECTED NOT DETECTED   Serratia marcescens NOT DETECTED NOT DETECTED   Haemophilus influenzae NOT DETECTED NOT DETECTED   Neisseria meningitidis NOT DETECTED NOT DETECTED   Pseudomonas aeruginosa NOT DETECTED NOT DETECTED   Candida albicans NOT DETECTED NOT DETECTED   Candida glabrata NOT DETECTED NOT DETECTED   Candida krusei NOT DETECTED NOT DETECTED   Candida parapsilosis NOT DETECTED NOT DETECTED   Candida tropicalis NOT DETECTED NOT DETECTED    Name of physician (or Provider) Contacted: Dr Johnette Abraham. Mathews  Currently on no antibiotics  Changes to prescribed antibiotics required: no changes at this time; MD will continue to monitor patient  Everette Rank, PharmD 04/23/2018  5:45 PM

## 2018-04-23 NOTE — Progress Notes (Signed)
PROGRESS NOTE    Stephanie Olson  HCW:[id number] DOB: 20-Feb-1941 DOA: 04/22/2018 PCP: Benito Mccreedy, MD  Brief Narrative 77 y.o. female with medical history significant of HTN, DM, PVD, Normocytic Anemia and other comorbids who presents with along with generalized weakness and fatigue.  Patient has been feeling more nauseous and vomiting and refusing to eat for the last week and states that since she fell last Friday she has not been well.  Denies any fevers or chills.  Her main complaint is her very poor appetite.  Denies chest pain, lightheadedness or dizziness.  Did admit to having some lower abdominal pain.  Also stated that she is has extreme difficulty urinating and states that she has not been urinating as much.  No other concerns or complaints at this time [facility] was called to admit this patient for generalized weakness and fatigue along with acute kidney injury on chronic kidney disease.  ED Course: The ED basic blood work was done patient was given 1 L normal saline bolus along with IV Zofran and started on IV vancomycin IV cefepime for suspected aspiration pneumonia  Assessment & Plan:   Active Problems:   PVD (peripheral vascular disease) (HCC)   Diabetic neuropathy (HCC)   Renal failure (ARF), acute on chronic (HCC)   Essential hypertension   Normocytic anemia   Metabolic acidosis, normal anion gap (NAG)   Hyperkalemia   Aspiration into airway   Generalized weakness   Lethargy   Poor appetite   Failure to thrive in adult   Pressure injury of skin  1] AKI on CKD stage III-secondary to obstructive neuropathy versus neurogenic bladder renal functions improving after Foley catheter was placed creatinine down to 5.92 from 8.0 yesterday.  Potassium 5.6.  Patient is awake alert oriented to the place.  Renal ultrasound bilateral hydronephrosis appears improved following Foley catheter placement of the kidneys demonstrate cortical thinning.  CT scan done prior to the ultrasound  showed markedly distended urinary bladder suggesting possible neurogenic bladder or bladder outlet obstruction.  Monitor renal functions closely.  2] hypertension continue amlodipine.  3]?  Aspiration pneumonia patient is on room air saturation above 92% with no complaints or symptoms of cough shortness of breath at this time.  No fever chills reported.will dc zosyn and monitor.  4] peripheral vascular disease restart Plavix.   DVT prophylaxis: Subcu heparin Code Status: Full code Family Communication none Disposition Plan: TBD Consultants: None   Procedures none Antimicrobials: None  Subjective: Resting in bed was sleeping when I walked in the room but is able to wake her up answer my questions appropriately.  Objective: Vitals:   04/22/18 1700 04/22/18 1837 04/22/18 2140 04/23/18 0630  BP: 119/74 127/85 127/83 134/85  Pulse: (!) 104 (!) 113 (!) 112 92  Resp: 15 16 16 16   Temp:   98.1 F (36.7 C) 97.7 F (36.5 C)  TempSrc:   Oral Oral  SpO2: 99% 95% 99% 98%  Weight:      Height:        Intake/Output Summary (Last 24 hours) at 04/23/2018 1118 Last data filed at 04/23/2018 0649 Gross per 24 hour  Intake -  Output 3675 ml  Net -3675 ml   Filed Weights   04/22/18 1341  Weight: 61.2 kg    Examination:  General exam: Appears calm and comfortable  Respiratory system: Clear to auscultation. Respiratory effort normal. Cardiovascular system: S1 & S2 heard, RRR. No JVD, murmurs, rubs, gallops or clicks. No pedal edema. Gastrointestinal system: Abdomen  is nondistended, soft and nontender. No organomegaly or masses felt. Normal bowel sounds heard. Central nervous system: Alert and oriented. No focal neurological deficits. Extremities: Symmetric 5 x 5 power. Skin: No rashes, lesions or ulcers Psychiatry: Judgement and insight appear normal. Mood & affect appropriate.     Data Reviewed: I have personally reviewed following labs and imaging studies  CBC: Recent Labs    Lab Apr 28, 2018 1453 28-Apr-2018 1502 04/23/18 0418  WBC 12.7*  --  9.9  NEUTROABS 8.7*  --   --   HGB 10.2* 10.9* 10.8*  HCT 32.6* 32.0* 34.1*  MCV 87.2  --  86.8  PLT 455*  --  387*   Basic Metabolic Panel: Recent Labs  Lab April 28, 2018 1451 Apr 28, 2018 1453 28-Apr-2018 1502 04/23/18 0418  NA  --  136 133* 137  K  --  5.8* 5.7* 5.6*  CL  --  106 108 107  CO2  --  15*  --  21*  GLUCOSE  --  92 90 152*  BUN  --  109* 100* 90*  CREATININE  --  8.15* 8.30* 5.92*  CALCIUM  --  8.2*  --  8.3*  MG 2.5*  --   --   --   PHOS 6.1*  --   --   --    GFR: Estimated Creatinine Clearance: 7.9 mL/min (A) (by C-G formula based on SCr of 5.92 mg/dL (H)). Liver Function Tests: Recent Labs  Lab 28-Apr-2018 1453 04/23/18 0418  AST 15 15  ALT 13 14  ALKPHOS 80 82  BILITOT 0.6 0.5  PROT 6.4* 6.5  ALBUMIN 2.6* 2.7*   No results for input(s): LIPASE, AMYLASE in the last 168 hours. No results for input(s): AMMONIA in the last 168 hours. Coagulation Profile: No results for input(s): INR, PROTIME in the last 168 hours. Cardiac Enzymes: No results for input(s): CKTOTAL, CKMB, CKMBINDEX, TROPONINI in the last 168 hours. BNP (last 3 results) No results for input(s): PROBNP in the last 8760 hours. HbA1C: Recent Labs    04/23/18 0418  HGBA1C 6.8*   CBG: Recent Labs  Lab April 28, 2018 1744 04/23/18 0931  GLUCAP 104* 99   Lipid Profile: No results for input(s): CHOL, HDL, LDLCALC, TRIG, CHOLHDL, LDLDIRECT in the last 72 hours. Thyroid Function Tests: Recent Labs    04/23/18 0418  TSH 0.945   Anemia Panel: No results for input(s): VITAMINB12, FOLATE, FERRITIN, TIBC, IRON, RETICCTPCT in the last 72 hours. Sepsis Labs: Recent Labs  Lab 2018-04-28 1501  LATICACIDVEN 0.55    No results found for this or any previous visit (from the past 240 hour(s)).       Radiology Studies: Ct Abdomen Pelvis Wo Contrast  Result Date: 04/28/18 CLINICAL DATA:  Weakness, lethargy and decreased appetite.  Nausea. Remote abdominal surgery, unspecified. EXAM: CT ABDOMEN AND PELVIS WITHOUT CONTRAST TECHNIQUE: Multidetector CT imaging of the abdomen and pelvis was performed following the standard protocol without IV contrast. COMPARISON:  CT 03/03/2010. FINDINGS: Lower chest: There are streaky left of the right lower lobe airspace opacities which could reflect atelectasis, although are suspicious for possible aspiration. There is a small left pleural effusion. No significant pericardial fluid. There is aortic and coronary artery atherosclerosis. Hepatobiliary: No focal hepatic abnormality is identified on noncontrast imaging. There is no significant biliary dilatation. The gallbladder is significantly distended, similar to the previous study. There is no evidence gallbladder wall thickening or surrounding inflammation. Multiple dependent small gallstones are present in the gallbladder lumen. Pancreas: Atrophied without focal  abnormality, ductal dilatation or surrounding inflammation. Spleen: Normal in size. Small calcified granuloma. No other focal abnormality. Adrenals/Urinary Tract: Stable mild thickening of both adrenal glands without focal nodule. Both kidneys demonstrate mild cortical thinning and at least moderate hydronephrosis and hydroureter, worse on the right. There is mild asymmetric perinephric soft tissue stranding on the right. No ureteral calculus or focal mass lesion identified. The urinary bladder is markedly distended without apparent focal abnormality. Stomach/Bowel: No evidence of bowel wall thickening, distention or surrounding inflammatory change. Mild proximal colonic diverticulosis. The appendix appears normal. Vascular/Lymphatic: There are no enlarged abdominal or pelvic lymph nodes. Aortic and branch vessel atherosclerosis. Reproductive: The uterus and ovaries appear normal. No adnexal mass. Other: No evidence of abdominal wall mass or hernia. No ascites. Musculoskeletal: No acute or  significant osseous findings. T11 Schmorl's node and lower lumbar spondylosis noted. IMPRESSION: 1. Markedly distended urinary bladder, suggesting possible neurogenic bladder versus bladder outlet obstruction. Associated bilateral hydronephrosis and hydroureter, likely secondary to the bladder distention. Consider Foley catheter placement. Follow-up renal ultrasound recommended after voiding or bladder catheterization to document decompression of the bilateral collecting systems. No urinary tract calculi seen. 2. Gallbladder hydrops with dependent stones, similar to previous studies. No gallbladder wall thickening or pericholecystic inflammation identified. 3. Patchy left-greater-than-right basilar airspace opacities suspicious for aspiration. Chest radiographs are pending. 4. Coronary and Aortic Atherosclerosis (ICD10-I70.0). Electronically Signed   By: Richardean Sale M.D.   On: 04/22/2018 14:28   Dg Chest 2 View  Result Date: 04/23/2018 CLINICAL DATA:  Pneumonia EXAM: CHEST - 2 VIEW COMPARISON:  04/22/2018 FINDINGS: Similar mild bandlike opacity in the left lower lobe over the diaphragm more compatible with atelectasis. Right lung remains clear. No significant airspace opacity, collapse or consolidation. No enlarging effusion or pneumothorax. Trachea is midline. Normal heart size and vascularity. Degenerative changes of the shoulders. IMPRESSION: Left basilar atelectasis. Electronically Signed   By: Jerilynn Mages.  Shick M.D.   On: 04/23/2018 10:25   Dg Chest 2 View  Result Date: 04/22/2018 CLINICAL DATA:  Lethargy and nausea EXAM: CHEST - 2 VIEW COMPARISON:  April 11, 2018 FINDINGS: There is atelectatic change in the left base. The lungs elsewhere are clear. Heart size and pulmonary vascularity are normal. No adenopathy. No bone lesions. IMPRESSION: Left base atelectasis. Earliest changes of pneumonia in this area cannot be excluded. Lungs elsewhere clear. Heart size normal. No evident adenopathy. Electronically  Signed   By: Lowella Grip III M.D.   On: 04/22/2018 14:29   US Renal  Result Date: 04/22/2018 CLINICAL DATA:  Acute kidney injury. EXAM: RENAL / URINARY TRACT ULTRASOUND COMPLETE COMPARISON:  CT 04/22/2018. FINDINGS: Right Kidney: Length: 9.9 cm. Mild renal cortical thinning without focal abnormality. There is moderate right-sided hydronephrosis and proximal hydroureter, probably mildly improved from the earlier CT. Left Kidney: Length: 9.9 cm. Mild renal cortical thinning without focal cortical abnormality. There is mild left-sided hydronephrosis and proximal hydroureter, probably mildly improved from the earlier CT. Bladder: Foley catheter has been placed since the CT with decompression of the bladder. IMPRESSION: 1. The bilateral hydronephrosis appears improved following Foley catheter placement. 2. Both kidneys demonstrate cortical thinning. Electronically Signed   By: Richardean Sale M.D.   On: 04/22/2018 19:25        Scheduled Meds: . amLODipine  5 mg Oral Daily  . feeding supplement (ENSURE ENLIVE)  237 mL Oral BID BM  . heparin  5,000 Units Subcutaneous Q8H  . insulin aspart  0-9 Units Subcutaneous TID WC  .  mirtazapine  7.5 mg Oral QHS  . sertraline  75 mg Oral Daily  . sodium polystyrene  30 g Oral Once   Continuous Infusions: . sodium chloride 75 mL/hr at 04/22/18 1858  . piperacillin-tazobactam (ZOSYN)  IV 2.25 g (04/22/18 2239)     LOS: 1 day     Georgette Shell, MD [facility]  If 7PM-7AM, please contact night-coverage [email or web address] Password [email or web address] 04/23/2018, 11:18 AM

## 2018-04-23 NOTE — Progress Notes (Signed)
PT Cancellation Note  Patient Details Name: Stephanie Olson MRN: [id number] DOB: June 07, 1941   Cancelled Treatment:    Reason Eval/Treat Not Completed: Medical issues which prohibited therapy Patient with ongoing high K+ (5.6 at time of chart review), which is outside of safe limitations as set by [facility] rehabilitation guidelines. Hold for now, plan to check back as schedule allows and patient is medically appropriate.   Deniece Ree PT, DPT, CBIS  Supplemental Physical Therapist [facility]   Pager [phone]

## 2018-04-24 LAB — BASIC METABOLIC PANEL
Anion gap: 9 (ref 5–15)
BUN: 76 mg/dL — AB (ref 8–23)
CO2: 22 mmol/L (ref 22–32)
CREATININE: 4.01 mg/dL — AB (ref 0.44–1.00)
Calcium: 7.9 mg/dL — ABNORMAL LOW (ref 8.9–10.3)
Chloride: 110 mmol/L (ref 98–111)
GFR calc Af Amer: 12 mL/min — ABNORMAL LOW (ref >60)
GFR, EST NON AFRICAN AMERICAN: 10 mL/min — AB (ref >60)
Glucose, Bld: 88 mg/dL (ref 70–99)
Potassium: 5.4 mmol/L — ABNORMAL HIGH (ref 3.5–5.1)
SODIUM: 141 mmol/L (ref 135–145)

## 2018-04-24 LAB — URINE CULTURE: Culture: 50000 — AB

## 2018-04-24 LAB — GLUCOSE, CAPILLARY
GLUCOSE-CAPILLARY: 92 mg/dL (ref 70–99)
GLUCOSE-CAPILLARY: 144 mg/dL — AB (ref 70–99)
GLUCOSE-CAPILLARY: 66 mg/dL — AB (ref 70–99)
GLUCOSE-CAPILLARY: 111 mg/dL — AB (ref 70–99)
Glucose-Capillary: 79 mg/dL (ref 70–99)

## 2018-04-24 MED ORDER — SODIUM CHLORIDE 0.9 % IV SOLN
INTRAVENOUS | Status: DC
  Administered 2018-04-24 – 2018-04-27 (×7): via INTRAVENOUS

## 2018-04-24 NOTE — Progress Notes (Signed)
PROGRESS NOTE    Stephanie Olson  OEV:[id number] DOB: [birth year] DOA: 04/22/2018 PCP: Benito Mccreedy, MD  Brief Narrative: 77 y.o.femalewith medical history significant ofHTN, DM, PVD, Normocytic Anemia and other comorbids who presents withalong with generalized weakness and fatigue. Patient has been feeling more nauseous and vomiting and refusing to eat for the last week and states that since she fell last Friday she has not been well. Denies any fevers or chills. Her main complaint is her very poor appetite. Denies chest pain, lightheadedness or dizziness. Did admit to having some lower abdominal pain. Also stated that she is has extreme difficulty urinating and states that she has not been urinating as much. No other concerns or complaints at this time [facility]H was called to admit this patient for generalized weakness and fatigue along with acute kidney injury on chronic kidney disease.  ED Course:The ED basic blood work was done patient was given 1 L normal saline bolus along with IV Zofran and started on IV vancomycin IV cefepime for suspected aspiration pneumonia   Assessment & Plan:   Active Problems:   PVD (peripheral vascular disease) (HCC)   Diabetic neuropathy (HCC)   Renal failure (ARF), acute on chronic (HCC)   Essential hypertension   Normocytic anemia   Metabolic acidosis, normal anion gap (NAG)   Hyperkalemia   Aspiration into airway   Generalized weakness   Lethargy   Poor appetite   Failure to thrive in adult   Pressure injury of skin   Acute renal failure (HCC)   Urinary retention   AKI (acute kidney injury) ([place])  1] AKI on CKD stage III-secondary to urinary retention  neurogenic bladder renal functions improving after Foley catheter was placed creatinine down to 4.01 from 8.0 yesterday.  Potassium 5.4.  Patient is awake alert oriented to the place.  Renal ultrasound bilateral hydronephrosis appears improved following Foley catheter placement of the  kidneys demonstrate cortical thinning.  CT scan done prior to the ultrasound showed markedly distended urinary bladder suggesting possible neurogenic bladder or bladder outlet obstruction.  Monitor renal functions closely.  Urine output was over 1600 cc in the last 24 hours.  2] hypertension blood pressure soft DC Norvasc start normal saline at 75 cc an hour.  3]?  Aspiration pneumonia patient is on room air saturation above 92% with no complaints or symptoms of cough shortness of breath at this time.  No fever chills reported.will dc zosyn and monitor.1/2 blood culture coag neg staph.  4] peripheral vascular disease restart Plavix.    DVT prophylaxis:heparin Code Status full Family Communication none  Disposition Plan:tbd   Consultants: none Procedures:none Antimicrobials:none  Subjective: Resting in bed No acute distress. complains of decreased appetite no nausea vomiting or diarrhea or abdominal pain reported.  No chest pain shortness of breath or cough.  Objective: Vitals:   04/23/18 1503 04/23/18 2210 04/24/18 0527 04/24/18 0529  BP: 95/68 92/63  (!) 97/57  Pulse: (!) 105 (!) 106  (!) 102  Resp: 18 10  (!) 7  Temp:  98.7 F (37.1 C)  98.8 F (37.1 C)  TempSrc:  Oral  Oral  SpO2: 96% 98%  99%  Weight:   61.3 kg   Height:        Intake/Output Summary (Last 24 hours) at 04/24/2018 1029 Last data filed at 04/24/2018 0527 Gross per 24 hour  Intake -  Output 1625 ml  Net -1625 ml   Filed Weights   04/22/18 1341 04/24/18 0527  Weight: 61.2  kg 61.3 kg    Examination:  General exam: Appears calm and comfortable  Respiratory system: Clear to auscultation. Respiratory effort normal. Cardiovascular system: S1 & S2 heard, RRR. No JVD, murmurs, rubs, gallops or clicks. No pedal edema. Gastrointestinal system: Abdomen is nondistended, soft and nontender. No organomegaly or masses felt. Normal bowel sounds heard. Central nervous system: Alert and oriented. No focal  neurological deficits. Extremities: Symmetric 5 x 5 power. Skin: No rashes, lesions or ulcers Psychiatry: Judgement and insight appear normal. Mood & affect appropriate.     Data Reviewed: I have personally reviewed following labs and imaging studies  CBC: Recent Labs  Lab 04/22/18 1453 04/22/18 1502 04/23/18 0418  WBC 12.7*  --  9.9  NEUTROABS 8.7*  --   --   HGB 10.2* 10.9* 10.8*  HCT 32.6* 32.0* 34.1*  MCV 87.2  --  86.8  PLT 455*  --  419*   Basic Metabolic Panel: Recent Labs  Lab 04/22/18 1451 04/22/18 1453 04/22/18 1502 04/23/18 0418 04/24/18 0413  NA  --  136 133* 137 141  K  --  5.8* 5.7* 5.6* 5.4*  CL  --  106 108 107 110  CO2  --  15*  --  21* 22  GLUCOSE  --  92 90 152* 88  BUN  --  109* 100* 90* 76*  CREATININE  --  8.15* 8.30* 5.92* 4.01*  CALCIUM  --  8.2*  --  8.3* 7.9*  MG 2.5*  --   --   --   --   PHOS 6.1*  --   --   --   --    GFR: Estimated Creatinine Clearance: 11.7 mL/min (A) (by C-G formula based on SCr of 4.01 mg/dL (H)). Liver Function Tests: Recent Labs  Lab 04/22/18 1453 04/23/18 0418  AST 15 15  ALT 13 14  ALKPHOS 80 82  BILITOT 0.6 0.5  PROT 6.4* 6.5  ALBUMIN 2.6* 2.7*   No results for input(s): LIPASE, AMYLASE in the last 168 hours. No results for input(s): AMMONIA in the last 168 hours. Coagulation Profile: No results for input(s): INR, PROTIME in the last 168 hours. Cardiac Enzymes: No results for input(s): CKTOTAL, CKMB, CKMBINDEX, TROPONINI in the last 168 hours. BNP (last 3 results) No results for input(s): PROBNP in the last 8760 hours. HbA1C: Recent Labs    04/23/18 0418  HGBA1C 6.8*   CBG: Recent Labs  Lab 04/23/18 1211 04/23/18 1329 04/23/18 1738 04/23/18 2307 04/24/18 0807  GLUCAP 183* 141* 186* 99 79   Lipid Profile: No results for input(s): CHOL, HDL, LDLCALC, TRIG, CHOLHDL, LDLDIRECT in the last 72 hours. Thyroid Function Tests: Recent Labs    04/23/18 0418  TSH 0.945   Anemia Panel: No  results for input(s): VITAMINB12, FOLATE, FERRITIN, TIBC, IRON, RETICCTPCT in the last 72 hours. Sepsis Labs: Recent Labs  Lab 04/22/18 1501  LATICACIDVEN 0.55    Recent Results (from the past 240 hour(s))  Urine culture     Status: Abnormal   Collection Time: 04/22/18  1:29 PM  Result Value Ref Range Status   Specimen Description   Final    URINE, RANDOM Performed at [facility], [place] [address] [place], [place] [address]    Special Requests   Final    NONE Performed at [facility], [place] [address] [place], [place] [address]    Culture 50,000 COLONIES/mL KLEBSIELLA PNEUMONIAE (A)  Final   Report Status 04/24/2018 FINAL  Final   Organism ID, Bacteria KLEBSIELLA PNEUMONIAE (A)  Final      Susceptibility   Klebsiella pneumoniae - MIC*    AMPICILLIN >=32 RESISTANT Resistant     CEFAZOLIN <=4 SENSITIVE Sensitive     CEFTRIAXONE <=1 SENSITIVE Sensitive     CIPROFLOXACIN <=0.25 SENSITIVE Sensitive     GENTAMICIN <=1 SENSITIVE Sensitive     IMIPENEM <=0.25 SENSITIVE Sensitive     NITROFURANTOIN 64 INTERMEDIATE Intermediate     TRIMETH/SULFA <=20 SENSITIVE Sensitive     AMPICILLIN/SULBACTAM 4 SENSITIVE Sensitive     PIP/TAZO <=4 SENSITIVE Sensitive     Extended ESBL NEGATIVE Sensitive     * 50,000 COLONIES/mL KLEBSIELLA PNEUMONIAE  Blood culture (routine x 2)     Status: None (Preliminary result)   Collection Time: 04/22/18  2:53 PM  Result Value Ref Range Status   Specimen Description   Final    BLOOD LEFT ARM Performed at [place] [address], [place], [place] [address]    Special Requests   Final    BOTTLES DRAWN AEROBIC AND ANAEROBIC Blood Culture adequate volume Performed at [place] [address] [place], [place] [address]    Culture   Final    NO GROWTH < 24 HOURS Performed at [place] [facility] [address] [place], [place] [address]    Report Status PENDING  Incomplete   Blood culture (routine x 2)     Status: None (Preliminary result)   Collection Time: 04/22/18  3:35 PM  Result Value Ref Range Status   Specimen Description   Final    BLOOD RIGHT HAND Performed at [place] [address], [place], [place] [address]    Special Requests   Final    BOTTLES DRAWN AEROBIC AND ANAEROBIC Blood Culture results may not be optimal due to an excessive volume of blood received in culture bottles Performed at [place] [address] [place], [place] [address]    Culture  Setup Time   Final    ANAEROBIC BOTTLE ONLY FEW GRAM POSITIVE COCCI CRITICAL RESULT CALLED TO, READ BACK BY AND VERIFIED WITH: LEANN POINDEXTER @ 0258 ON 04/23/18 BY ROBINSON Z. Performed at [facility], [place] [address], [place],  [address]    Culture GRAM POSITIVE COCCI  Final   Report Status PENDING  Incomplete  Blood Culture ID Panel (Reflexed)     Status: Abnormal   Collection Time: 04/22/18  3:35 PM  Result Value Ref Range Status   Enterococcus species NOT DETECTED NOT DETECTED Final   Listeria monocytogenes NOT DETECTED NOT DETECTED Final   Staphylococcus species DETECTED (A) NOT DETECTED Final    Comment: Methicillin (oxacillin) resistant coagulase negative staphylococcus. Possible blood culture contaminant (unless isolated from more than one blood culture draw or clinical case suggests pathogenicity). No antibiotic treatment is indicated for blood  culture contaminants. CRITICAL RESULT CALLED TO, READ BACK BY AND VERIFIED WITH: LEANN POINDEXTER @ 2423 ON 04/23/18 BY ROBINSON Z.     Staphylococcus aureus NOT DETECTED NOT DETECTED Final   Methicillin resistance DETECTED (A) NOT DETECTED Final    Comment: CRITICAL RESULT CALLED TO, READ BACK BY AND VERIFIED WITH: LEANN POINDEXTER @ 5361 ON 04/23/18 BY ROBINSON Z.     Streptococcus species NOT DETECTED NOT DETECTED Final   Streptococcus agalactiae NOT DETECTED NOT DETECTED  Final   Streptococcus pneumoniae NOT DETECTED NOT DETECTED Final   Streptococcus pyogenes NOT  DETECTED NOT DETECTED Final   Acinetobacter baumannii NOT DETECTED NOT DETECTED Final   Enterobacteriaceae species NOT DETECTED NOT DETECTED Final   Enterobacter cloacae complex NOT DETECTED NOT DETECTED Final   Escherichia coli NOT DETECTED NOT DETECTED Final   Klebsiella oxytoca NOT DETECTED NOT DETECTED Final   Klebsiella pneumoniae NOT DETECTED NOT DETECTED Final   Proteus species NOT DETECTED NOT DETECTED Final   Serratia marcescens NOT DETECTED NOT DETECTED Final   Haemophilus influenzae NOT DETECTED NOT DETECTED Final   Neisseria meningitidis NOT DETECTED NOT DETECTED Final   Pseudomonas aeruginosa NOT DETECTED NOT DETECTED Final   Candida albicans NOT DETECTED NOT DETECTED Final   Candida glabrata NOT DETECTED NOT DETECTED Final   Candida krusei NOT DETECTED NOT DETECTED Final   Candida parapsilosis NOT DETECTED NOT DETECTED Final   Candida tropicalis NOT DETECTED NOT DETECTED Final         Radiology Studies: Ct Abdomen Pelvis Wo Contrast  Result Date: 04/22/2018 CLINICAL DATA:  Weakness, lethargy and decreased appetite. Nausea. Remote abdominal surgery, unspecified. EXAM: CT ABDOMEN AND PELVIS WITHOUT CONTRAST TECHNIQUE: Multidetector CT imaging of the abdomen and pelvis was performed following the standard protocol without IV contrast. COMPARISON:  CT 03/03/2010. FINDINGS: Lower chest: There are streaky left of the right lower lobe airspace opacities which could reflect atelectasis, although are suspicious for possible aspiration. There is a small left pleural effusion. No significant pericardial fluid. There is aortic and coronary artery atherosclerosis. Hepatobiliary: No focal hepatic abnormality is identified on noncontrast imaging. There is no significant biliary dilatation. The gallbladder is significantly distended, similar to the previous study. There is no evidence gallbladder  wall thickening or surrounding inflammation. Multiple dependent small gallstones are present in the gallbladder lumen. Pancreas: Atrophied without focal abnormality, ductal dilatation or surrounding inflammation. Spleen: Normal in size. Small calcified granuloma. No other focal abnormality. Adrenals/Urinary Tract: Stable mild thickening of both adrenal glands without focal nodule. Both kidneys demonstrate mild cortical thinning and at least moderate hydronephrosis and hydroureter, worse on the right. There is mild asymmetric perinephric soft tissue stranding on the right. No ureteral calculus or focal mass lesion identified. The urinary bladder is markedly distended without apparent focal abnormality. Stomach/Bowel: No evidence of bowel wall thickening, distention or surrounding inflammatory change. Mild proximal colonic diverticulosis. The appendix appears normal. Vascular/Lymphatic: There are no enlarged abdominal or pelvic lymph nodes. Aortic and branch vessel atherosclerosis. Reproductive: The uterus and ovaries appear normal. No adnexal mass. Other: No evidence of abdominal wall mass or hernia. No ascites. Musculoskeletal: No acute or significant osseous findings. T11 Schmorl's node and lower lumbar spondylosis noted. IMPRESSION: 1. Markedly distended urinary bladder, suggesting possible neurogenic bladder versus bladder outlet obstruction. Associated bilateral hydronephrosis and hydroureter, likely secondary to the bladder distention. Consider Foley catheter placement. Follow-up renal ultrasound recommended after voiding or bladder catheterization to document decompression of the bilateral collecting systems. No urinary tract calculi seen. 2. Gallbladder hydrops with dependent stones, similar to previous studies. No gallbladder wall thickening or pericholecystic inflammation identified. 3. Patchy left-greater-than-right basilar airspace opacities suspicious for aspiration. Chest radiographs are pending. 4.  Coronary and Aortic Atherosclerosis (ICD10-I70.0). Electronically Signed   By: Richardean Sale M.D.   On: 04/22/2018 14:28   Dg Chest 2 View  Result Date: 04/23/2018 CLINICAL DATA:  Pneumonia EXAM: CHEST - 2 VIEW COMPARISON:  04/22/2018 FINDINGS: Similar mild bandlike opacity in the left lower lobe over the diaphragm more compatible with atelectasis. Right lung remains clear. No significant airspace opacity, collapse  or consolidation. No enlarging effusion or pneumothorax. Trachea is midline. Normal heart size and vascularity. Degenerative changes of the shoulders. IMPRESSION: Left basilar atelectasis. Electronically Signed   By: Jerilynn Mages.  Shick M.D.   On: 04/23/2018 10:25   Dg Chest 2 View  Result Date: 04/22/2018 CLINICAL DATA:  Lethargy and nausea EXAM: CHEST - 2 VIEW COMPARISON:  April 11, 2018 FINDINGS: There is atelectatic change in the left base. The lungs elsewhere are clear. Heart size and pulmonary vascularity are normal. No adenopathy. No bone lesions. IMPRESSION: Left base atelectasis. Earliest changes of pneumonia in this area cannot be excluded. Lungs elsewhere clear. Heart size normal. No evident adenopathy. Electronically Signed   By: Lowella Grip III M.D.   On: 04/22/2018 14:29   US Renal  Result Date: 04/22/2018 CLINICAL DATA:  Acute kidney injury. EXAM: RENAL / URINARY TRACT ULTRASOUND COMPLETE COMPARISON:  CT 04/22/2018. FINDINGS: Right Kidney: Length: 9.9 cm. Mild renal cortical thinning without focal abnormality. There is moderate right-sided hydronephrosis and proximal hydroureter, probably mildly improved from the earlier CT. Left Kidney: Length: 9.9 cm. Mild renal cortical thinning without focal cortical abnormality. There is mild left-sided hydronephrosis and proximal hydroureter, probably mildly improved from the earlier CT. Bladder: Foley catheter has been placed since the CT with decompression of the bladder. IMPRESSION: 1. The bilateral hydronephrosis appears improved  following Foley catheter placement. 2. Both kidneys demonstrate cortical thinning. Electronically Signed   By: Richardean Sale M.D.   On: 04/22/2018 19:25        Scheduled Meds: . amLODipine  5 mg Oral Daily  . clopidogrel  75 mg Oral Q breakfast  . feeding supplement (ENSURE ENLIVE)  237 mL Oral BID BM  . heparin  5,000 Units Subcutaneous Q8H  . insulin aspart  0-9 Units Subcutaneous TID WC  . mirtazapine  7.5 mg Oral QHS  . sertraline  75 mg Oral Daily   Continuous Infusions:   LOS: 2 days     Georgette Shell, MD [facility] If 7PM-7AM, please contact night-coverage [email or web address] Password [facility] 04/24/2018, 10:29 AM

## 2018-04-24 NOTE — Evaluation (Addendum)
Clinical/Bedside Swallow Evaluation Patient Details  Name: Stephanie Olson MRN: [id number] Date of Birth: 07-Dec-1940  Today's Date: 04/24/2018 Time: SLP Start Time (ACUTE ONLY): 0910 SLP Stop Time (ACUTE ONLY): 0945 SLP Time Calculation (min) (ACUTE ONLY): 35 min  Past Medical History:  Past Medical History:  Diagnosis Date  . Arthritis   . Diabetes mellitus without complication ([place])   . Diabetic foot ulcers (HCC)    RIGHT   . Hypertension   . Peripheral vascular disease [facility])    Past Surgical History:  Past Surgical History:  Procedure Laterality Date  . ABDOMINAL AORTAGRAM  01/29/2015  . ATHERECTOMY Right 01/29/2015   FEMORAL ARTERY   . BALLOON ANGIOPLASTY, ARTERY Right 01/29/2015   RT FEMORAL   . FOOT SURGERY    . PERIPHERAL VASCULAR CATHETERIZATION N/A 01/29/2015   Procedure: Abdominal Aortogram w/Lower Extremity;  Surgeon: Serafina Mitchell, MD;  Location: [facility];  Service: Cardiovascular;  Laterality: N/A;   HPI:  Patient is a 77 y.o. female with PMH: HTN, DM, PVD, normocytic anemia who presented to hospital with c/o poor appetite with some lower abdomen pain, difficulty urinating. Patient with suspected aspiration PNA on admission and is being treated with IV antibiotics   Assessment / Plan / Recommendation Clinical Impression  Patient presents with a mild pharyngeal and moderate oral dysphagia without overt s/s of aspiration or penetration. Patient presented with significant delays in oral transit of puree and solid boluses and swallow initiation delay with thin liquids. Patient seemed aware that she was holding PO's, and she would tell SLP "I swallowed it", with longest delay being 45 seconds.  SLP Visit Diagnosis: Dysphagia, oropharyngeal phase (R13.12)   Patient's RN called SLP to inform that patient was holding puree and fine chopped PO's in mouth and pocketing on left side. She did fine with swallowing her medications and with thin liquids. Will downgrade to  thin liquids only.    Aspiration Risk  Mild aspiration risk;Moderate aspiration risk    Diet Recommendation Thin liquids only except puree with meds  Liquid Administration via: Straw;Cup Medication Administration: Whole meds with puree Supervision: Staff to assist with self feeding;Full supervision/cueing for compensatory strategies Compensations: Minimize environmental distractions;Follow solids with liquid Postural Changes: Seated upright at 90 degrees    Other  Recommendations Oral Care Recommendations: Oral care BID   Follow up Recommendations Skilled Nursing facility      Frequency and Duration min 2x/week  1 week       Prognosis Prognosis for Safe Diet Advancement: Good      Swallow Study   General Date of Onset: 04/22/18 HPI: Patient is a 77 y.o. female with PMH: HTN, DM, PVD, normocytic anemia who presented to hospital with c/o poor appetite with some lower abdomen pain, difficulty urinating. Patient with suspected aspiration PNA on admission and is being treated with IV antibiotics Type of Study: Bedside Swallow Evaluation Previous Swallow Assessment: N/A Diet Prior to this Study: Regular;Thin liquids Temperature Spikes Noted: No Respiratory Status: Room air History of Recent Intubation: No Behavior/Cognition: Alert;Cooperative;Pleasant mood Oral Cavity Assessment: Within Functional Limits Oral Care Completed by SLP: Yes Oral Cavity - Dentition: Dentures, top;Dentures, bottom Vision: Functional for self-feeding Self-Feeding Abilities: Needs assist;Needs set up Patient Positioning: Upright in bed Baseline Vocal Quality: Normal Volitional Cough: Weak    Oral/Motor/Sensory Function Overall Oral Motor/Sensory Function: Mild impairment Facial ROM: Within Functional Limits Facial Symmetry: Within Functional Limits Facial Strength: Within Functional Limits Lingual Symmetry: Within Functional Limits Lingual Strength: Reduced  Velum: Within Functional  Limits Mandible: Within Functional Limits   Ice Chips Ice chips: Not tested   Thin Liquid Thin Liquid: Impaired Presentation: Cup;Straw Pharyngeal  Phase Impairments: Suspected delayed Swallow Other Comments: No overt s/s of aspiration or penetration    Nectar Thick     Honey Thick     Puree Puree: Impaired Presentation: Spoon Oral Phase Impairments: Reduced lingual movement/coordination;Impaired mastication Oral Phase Functional Implications: Oral holding Pharyngeal Phase Impairments: Suspected delayed Swallow   Solid     Solid: Impaired Oral Phase Impairments: Reduced lingual movement/coordination;Impaired mastication Oral Phase Functional Implications: Impaired mastication;Oral holding;Prolonged oral transit;Oral residue Pharyngeal Phase Impairments: Suspected delayed [place], MA, CCC-SLP 04/24/18 11:06 AM

## 2018-04-24 NOTE — Evaluation (Addendum)
Physical Therapy Evaluation Patient Details Name: Stephanie Olson MRN: [id number] DOB: 1941/07/17 Today's Date: 04/24/2018   History of Present Illness  77 y.o. female with medical history significant of HTN, DM, PVD, Normocytic Anemia and other comorbids who presents with along with generalized weakness and fatigue.  Patient has been feeling more nauseous and vomiting and refusing to eat for the last week and states that since she fell last Friday she has not been well.  Patient admitted with acute kidney injury and CKD.    Clinical Impression  Patient presents with decreased independence with mobility due to decreased strength, balance and decreased safety awareness with recent fall.  She currently needs min to mod A for mobility bed to chair.  She reports previously was able to get to w/c on her own.  She will benefit from skilled PT in the acute setting to maximize mobility prior to d/c to STSNF level rehab prior to return to ALF.  Of note HR up to 126 with OOB to chair today; returns to 110's at rest.    Follow Up Recommendations Supervision/Assistance - 24 hour;SNF    Equipment Recommendations  None recommended by PT    Recommendations for Other Services       Precautions / Restrictions Precautions Precautions: Fall Precaution Comments: watch HR      Mobility  Bed Mobility Overal bed mobility: Needs Assistance Bed Mobility: Supine to Sit     Supine to sit: HOB elevated;Mod assist     General bed mobility comments: lifting help for trunk, able to move legs off bed  Transfers Overall transfer level: Needs assistance Equipment used: Rolling walker (2 wheeled) Transfers: Sit to/from [facility] Sit to Stand: Mod assist;From elevated surface Stand pivot transfers: Min assist       General transfer comment: lifting assist from EOB to stand to walker and to take pivotal steps to chair, trunk flexed throughout and sits with uncontrolled descent; stood  second time from recliner for hygiene as pad on bed mildly soiled; assist for hygiene in standing  Ambulation/Gait                Stairs            Wheelchair Mobility    Modified Rankin (Stroke Patients Only)       Balance Overall balance assessment: Needs assistance   Sitting balance-Leahy Scale: Fair Sitting balance - Comments: sat EOB without LOB   Standing balance support: Bilateral upper extremity supported Standing balance-Leahy Scale: Poor Standing balance comment: assist with walker and some support in standing                             Pertinent Vitals/Pain Pain Assessment: 0-10 Pain Score: 6  Pain Location: butt Pain Descriptors / Indicators: Sore Pain Intervention(s): Monitored during session;Repositioned    Home Living Family/patient expects to be discharged to:: Assisted living               Home Equipment: Shower seat;Grab bars - toilet;Grab bars - tub/shower;Wheelchair - manual      Prior Function Level of Independence: Independent with assistive device(s)         Comments: reports gets into w/c on her own most of the time; but not reliable historian     Hand Dominance        Extremity/Trunk Assessment   Upper Extremity Assessment Upper Extremity Assessment: Generalized weakness    Lower Extremity Assessment  Lower Extremity Assessment: RLE deficits/detail;LLE deficits/detail RLE Deficits / Details: signiricant loss of muscle mass in LE's, AROM WFL, strength hip flexion 2+/5, knee extension 4-/5, ankle DF 4/5 LLE Deficits / Details: signiricant loss of muscle mass in LE's, AROM WFL, strength hip flexion 3+/5, knee extension 4/5, ankle DF 4/5       Communication   Communication: No difficulties  Cognition Arousal/Alertness: Awake/alert Behavior During Therapy: WFL for tasks assessed/performed Overall Cognitive Status: No family/caregiver present to determine baseline cognitive functioning                                  General Comments: oriented to hospital and somewhat to situation, but not to day/date or name of hospital, stated she lived alone, but is from ALF.      General Comments      Exercises     Assessment/Plan    PT Assessment Patient needs continued PT services  PT Problem List Decreased strength;Decreased mobility;Decreased safety awareness;Decreased knowledge of use of DME;Decreased balance       PT Treatment Interventions DME instruction;Therapeutic activities;Therapeutic exercise;Balance training;Functional mobility training;Wheelchair mobility training;Patient/family education    PT Goals (Current goals can be found in the Care Plan section)  Acute Rehab PT Goals Patient Stated Goal: to return to independent PT Goal Formulation: With patient Time For Goal Achievement: 05/08/18 Potential to Achieve Goals: Fair Additional Goals Additional Goal #1: Patient to propel w/c 100' with S.    Frequency Min 2X/week   Barriers to discharge        Co-evaluation               AM-PAC PT "6 Clicks" Daily Activity  Outcome Measure Difficulty turning over in bed (including adjusting bedclothes, sheets and blankets)?: A Lot Difficulty moving from lying on back to sitting on the side of the bed? : Unable Difficulty sitting down on and standing up from a chair with arms (e.g., wheelchair, bedside commode, etc,.)?: Unable Help needed moving to and from a bed to chair (including a wheelchair)?: A Lot Help needed walking in hospital room?: Total Help needed climbing 3-5 steps with a railing? : Total 6 Click Score: 8    End of Session Equipment Utilized During Treatment: Gait belt Activity Tolerance: Patient limited by fatigue Patient left: in chair;with chair alarm set;with call bell/phone within reach   PT Visit Diagnosis: Other abnormalities of gait and mobility (R26.89);History of falling (Z91.81);Muscle weakness (generalized) (M62.81)    Time:  7048-8891 PT Time Calculation (min) (ACUTE ONLY): 28 min   Charges:   PT Evaluation $PT Eval Moderate Complexity: 1 Mod PT Treatments $Therapeutic Activity: 8-22 mins        [place], [place] [phone] 04/24/2018   Reginia Naas 04/24/2018, 3:18 PM

## 2018-04-24 NOTE — Progress Notes (Signed)
Initial Nutrition Assessment  DOCUMENTATION CODES:   Non-severe (moderate) malnutrition in context of acute illness/injury  INTERVENTION:   Continue Ensure Enlive po BID, each supplement provides 350 kcal and 20 grams of protein  NUTRITION DIAGNOSIS:   Moderate Malnutrition related to nausea, vomiting, poor appetite, dysphagia, acute illness as evidenced by mild muscle depletion, mild fat depletion, energy intake < 75% for > 7 days.  GOAL:   Patient will meet greater than or equal to 90% of their needs  MONITOR:   PO intake, Supplement acceptance, Skin, I & O's, Weight trends, Labs  REASON FOR ASSESSMENT:   Consult Assessment of nutrition requirement/status  ASSESSMENT:   77 y.o. female with medical history significant of HTN, DM, PVD, Normocytic Anemia and other comorbids who presents with along with generalized weakness and fatigue.  Patient has been feeling more nauseous and vomiting and refusing to eat for the last week and states that since she fell last Friday she has not been well.  Patient assessed by SLP this morning, recommends thin liquids only with full supervision. Pt is pocketing food. Pt with mild-moderate risk of aspiration.  Pt had N/V for a week and did not eat PTA. Continues to have poor appetite. Consumed 50% of breakfast yesterday. Will continue order for Ensure supplements.  No recent weight loss recorded in weight records. Pt weighed 188 lb in 2017.   Will continue to monitor for plan.  Medications: Remeron tablet daily Labs reviewed: Elevated K GFR: 12   NUTRITION - FOCUSED PHYSICAL EXAM:    Most Recent Value  Orbital Region  No depletion  Upper Arm Region  Mild depletion  Thoracic and Lumbar Region  Unable to assess  Buccal Region  No depletion  Temple Region  Mild depletion  Clavicle Bone Region  Mild depletion  Clavicle and Acromion Bone Region  Mild depletion  Scapular Bone Region  Unable to assess  Dorsal Hand  No depletion  Patellar  Region  Unable to assess  Anterior Thigh Region  Unable to assess  Posterior Calf Region  Unable to assess  Edema (RD Assessment)  None       Diet Order:   Diet Order            DIET DYS 2 Room service appropriate? Yes; Fluid consistency: Thin  Diet effective now              EDUCATION NEEDS:   No education needs have been identified at this time  Skin:  Skin Assessment: Skin Integrity Issues: Skin Integrity Issues:: Stage II, Unstageable Stage II: coccyx Unstageable: ankle  Last BM:  8/17  Height:   Ht Readings from Last 1 Encounters:  04/22/18 5\' 9"  (1.753 m)    Weight:   Wt Readings from Last 1 Encounters:  04/24/18 61.3 kg    Ideal Body Weight:  72.7 kg  BMI:  Body mass index is 19.96 kg/m.  Estimated Nutritional Needs:   Kcal:  1550-1750  Protein:  65-75g  Fluid:  1.6L/day   Clayton Bibles, MS, RD, LDN [place] Dietitian Pager: [phone] After Hours Pager: [phone]

## 2018-04-24 NOTE — Progress Notes (Signed)
Patient is pocketing food, holding food in her mouth and "forgetting" it is in her mouth. Have utilized Speech Therapy's recommendation. Notified Speech Therapy. Eulas Post, RN

## 2018-04-25 DIAGNOSIS — E440 Moderate protein-calorie malnutrition: Secondary | ICD-10-CM

## 2018-04-25 LAB — BASIC METABOLIC PANEL
ANION GAP: 10 (ref 5–15)
BUN: 58 mg/dL — AB (ref 8–23)
CALCIUM: 8.3 mg/dL — AB (ref 8.9–10.3)
CO2: 23 mmol/L (ref 22–32)
Chloride: 111 mmol/L (ref 98–111)
Creatinine, Ser: 2.6 mg/dL — ABNORMAL HIGH (ref 0.44–1.00)
GFR calc Af Amer: 20 mL/min — ABNORMAL LOW (ref >60)
GFR, EST NON AFRICAN AMERICAN: 17 mL/min — AB (ref >60)
Glucose, Bld: 96 mg/dL (ref 70–99)
Potassium: 4.9 mmol/L (ref 3.5–5.1)
SODIUM: 144 mmol/L (ref 135–145)

## 2018-04-25 LAB — GLUCOSE, CAPILLARY
GLUCOSE-CAPILLARY: 94 mg/dL (ref 70–99)
GLUCOSE-CAPILLARY: 116 mg/dL — AB (ref 70–99)
Glucose-Capillary: 145 mg/dL — ABNORMAL HIGH (ref 70–99)
Glucose-Capillary: 191 mg/dL — ABNORMAL HIGH (ref 70–99)

## 2018-04-25 LAB — CULTURE, BLOOD (ROUTINE X 2)

## 2018-04-25 LAB — OCCULT BLOOD X 1 CARD TO LAB, STOOL: Fecal Occult Bld: NEGATIVE

## 2018-04-25 MED ORDER — LOPERAMIDE HCL 2 MG PO CAPS
2.0000 mg | ORAL_CAPSULE | Freq: Four times a day (QID) | ORAL | Status: DC | PRN
  Administered 2018-04-25: 2 mg via ORAL
  Filled 2018-04-25: qty 1

## 2018-04-25 NOTE — Evaluation (Signed)
Occupational Therapy Evaluation Patient Details Name: Stephanie Olson MRN: [id number] DOB: Nov 23, 1940 Today's Date: 04/25/2018    History of Present Illness 77 y.o. female with medical history significant of HTN, DM, PVD, Normocytic Anemia and other comorbids who presents with along with generalized weakness and fatigue.  Patient has been feeling more nauseous and vomiting and refusing to eat for the last week and states that since she fell last Friday she has not been well.  Patient admitted with acute kidney injury and CKD.     Clinical Impression   Pt admitted with above. She demonstrates the below listed deficits and will benefit from continued OT to maximize safety and independence with BADLs.  Pt presents to OT with generalized weakness, decreased activity tolerance, impaired balance, and decreased cognition.  She was living in ALF, and daughter reports she has experienced a progressive decline in function over the course of ~5 weeks from mod I with ADLs and ambulation with RW, to max A for ADLs and use of w/c only - was unable to self propel w/c prior to this admission.  Recommend SNF level rehab.        Follow Up Recommendations  SNF;Supervision/Assistance - 24 hour    Equipment Recommendations  None recommended by OT    Recommendations for Other Services       Precautions / Restrictions Precautions Precautions: Fall Restrictions Weight Bearing Restrictions: No      Mobility Bed Mobility Overal bed mobility: Needs Assistance Bed Mobility: Supine to Sit     Supine to sit: HOB elevated;Mod assist     General bed mobility comments: assist to move LEs off bed and to lift trunk   Transfers Overall transfer level: Needs assistance Equipment used: Rolling walker (2 wheeled) Transfers: Sit to/from [facility] Sit to Stand: Max assist Stand pivot transfers: Max assist       General transfer comment: Pt required verbal cues for hand placement, assist  to lift buttocks, assist to maintain balance, assist to pivot and maneuver RW.  Pt demonstrates difficulty achieving fully extended standing     Balance Overall balance assessment: Needs assistance Sitting-balance support: Feet supported Sitting balance-Leahy Scale: Fair     Standing balance support: Bilateral upper extremity supported Standing balance-Leahy Scale: Poor Standing balance comment: requires mod A and bil. Ue support                            ADL either performed or assessed with clinical judgement   ADL Overall ADL's : Needs assistance/impaired Eating/Feeding: Set up;Sitting;Bed level   Grooming: Wash/dry hands;Wash/dry face;Oral care;Minimal assistance;Sitting   Upper Body Bathing: Maximal assistance;Sitting   Lower Body Bathing: Maximal assistance;Sit to/from stand   Upper Body Dressing : Maximal assistance;Sitting   Lower Body Dressing: Total assistance;Sit to/from stand   Toilet Transfer: Maximal assistance;Stand-pivot;BSC;RW Toilet Transfer Details (indicate cue type and reason): Pt required verbal cues for hand placement, assist to lift buttocks, assist to maintain balance, assist to pivot and maneuver RW  Toileting- Clothing Manipulation and Hygiene: Total assistance;Sit to/from stand       Functional mobility during ADLs: Maximal assistance;Rolling walker       Vision Baseline Vision/History: Wears glasses Wears Glasses: At all times       Perception     Praxis      Pertinent Vitals/Pain Pain Assessment: Faces Faces Pain Scale: Hurts even more Pain Location: abdominal  Pain Descriptors / Indicators: Aching;Spasm Pain Intervention(s):  Limited activity within patient's tolerance;Monitored during session;Repositioned;Patient requesting pain meds-RN notified     Hand Dominance Right   Extremity/Trunk Assessment Upper Extremity Assessment Upper Extremity Assessment: Generalized weakness   Lower Extremity Assessment Lower  Extremity Assessment: Defer to PT evaluation   Cervical / Trunk Assessment Cervical / Trunk Assessment: Kyphotic   Communication Communication Communication: Expressive difficulties(Low volume, minimally interactive )   Cognition Arousal/Alertness: Awake/alert Behavior During Therapy: Flat affect Overall Cognitive Status: Impaired/Different from baseline Area of Impairment: Attention;Following commands;Problem solving                   Current Attention Level: Sustained   Following Commands: Follows one step commands consistently;Follows one step commands with increased time     Problem Solving: Slow processing;Decreased initiation;Difficulty sequencing;Requires verbal cues;Requires tactile cues General Comments: Pt with minimal interaction.  She is slow to respond.  requires max cues to sequence simple ADL taks/transfers    General Comments  daughter present initially     Exercises     Shoulder Instructions      Home Living Family/patient expects to be discharged to:: Skilled nursing facility                                 Additional Comments: Pt was residing at [facility] PTA       Prior Functioning/Environment Level of Independence: Needs assistance  Gait / Transfers Assistance Needed: Daughter reports that pt was mod I ~5 weeks ago, but since that time, she has been progressively declining to point of being w/c level and was too weak to even self propel  ADL's / Homemaking Assistance Needed: Daughter reports that pt was mod I ~ 5 weeks ago, but has progressed to max a            OT Problem List: Decreased strength;Decreased activity tolerance;Impaired balance (sitting and/or standing);Decreased cognition;Decreased safety awareness;Decreased knowledge of use of DME or AE;Pain      OT Treatment/Interventions: Self-care/ADL training;Therapeutic exercise;Energy conservation;DME and/or AE instruction;Therapeutic activities;Cognitive  remediation/compensation;Patient/family education;Balance training    OT Goals(Current goals can be found in the care plan section) Acute Rehab OT Goals Patient Stated Goal: Pt did not state  OT Goal Formulation: With patient Time For Goal Achievement: 05/09/18 Potential to Achieve Goals: Good ADL Goals Pt Will Perform Grooming: with set-up;sitting Pt Will Perform Upper Body Bathing: with set-up;sitting Pt Will Perform Lower Body Bathing: with mod assist;sit to/from stand Pt Will Transfer to Toilet: with min assist;stand pivot transfer;bedside commode Pt Will Perform Toileting - Clothing Manipulation and hygiene: with mod assist;sit to/from stand  OT Frequency: Min 2X/week   Barriers to D/C: Decreased caregiver support          Co-evaluation              AM-PAC PT "6 Clicks" Daily Activity     Outcome Measure Help from another person eating meals?: A Little Help from another person taking care of personal grooming?: A Little Help from another person toileting, which includes using toliet, bedpan, or urinal?: A Lot Help from another person bathing (including washing, rinsing, drying)?: A Lot Help from another person to put on and taking off regular upper body clothing?: A Lot Help from another person to put on and taking off regular lower body clothing?: Total 6 Click Score: 13   End of Session Equipment Utilized During Treatment: Rolling walker Nurse Communication: Mobility status  Activity Tolerance:  Patient limited by fatigue Patient left: in chair;with call bell/phone within reach;with chair alarm set;with nursing/sitter in room  OT Visit Diagnosis: Unsteadiness on feet (R26.81)                Time: 0905-0256 OT Time Calculation (min): 22 min Charges:  OT General Charges $OT Visit: 1 Visit OT Evaluation $OT Eval Moderate Complexity: 1 Mod  [place], OTR/L [phone]   Lucille Passy M 04/25/2018, 1:13 PM

## 2018-04-25 NOTE — NC FL2 (Signed)
[place] LEVEL OF CARE SCREENING TOOL     IDENTIFICATION  Patient Name: SHAUNTEL PREST Birthdate: [birth year] Sex: female Admission Date (Current Location): 04/22/2018  [facility] and [place] Number:  Herbalist and Address:  [facility],  [place], [place]      Provider Number: [id number]  Attending Physician Name and Address:  Georgette Shell, MD  Relative Name and Phone Number:       Current Level of Care: Hospital Recommended Level of Care: [place] Prior Approval Number:    Date Approved/Denied:   PASRR Number:    Discharge Plan: SNF    Current Diagnoses: Patient Active Problem List   Diagnosis Date Noted  . Malnutrition of moderate degree 04/25/2018  . Pressure injury of skin 04/23/2018  . Acute renal failure ([place])   . Urinary retention   . AKI (acute kidney injury) ([place])   . Renal failure (ARF), acute on chronic (HCC) 04/22/2018  . Essential hypertension 04/22/2018  . Normocytic anemia 04/22/2018  . Metabolic acidosis, normal anion gap (NAG) 04/22/2018  . Hyperkalemia 04/22/2018  . Aspiration into airway 04/22/2018  . Generalized weakness 04/22/2018  . Lethargy 04/22/2018  . Poor appetite 04/22/2018  . Failure to thrive in adult 04/22/2018  . Diabetic neuropathy ([place]) 11/21/2015  . 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/2015  . Diabetic ulcer of foot, limited to breakdown of skin ([place]) 11/21/2015  . Foot pain, left 02/14/2015  . PVD (peripheral vascular disease) ([place]) 01/29/2015    Orientation RESPIRATION BLADDER Height & Weight     Time, Self, Situation, Place  Normal Continent Weight: 134 lb 4.2 oz (60.9 kg) Height:  5\' 9"  (175.3 cm)  BEHAVIORAL SYMPTOMS/MOOD NEUROLOGICAL BOWEL NUTRITION STATUS      Incontinent Diet( )  AMBULATORY STATUS COMMUNICATION OF NEEDS Skin   Extensive Assist Verbally Other (Comment)(stage 2 pressure wound- coccyx, unstageable pressure wound- right ankle. )                        Personal Care Assistance Level of Assistance  Bathing, Feeding, Dressing Bathing Assistance: Limited assistance Feeding assistance: Independent Dressing Assistance: Maximum assistance     Functional Limitations Info             SPECIAL CARE FACTORS FREQUENCY  PT (By licensed PT), OT (By licensed OT)     PT Frequency: 5 OT Frequency: 5            Contractures      Additional Factors Info  Code Status, Allergies Code Status Info: Full Code  Allergies Info: NKA            Current Medications (04/25/2018):  This is the current hospital active medication list Current Facility-Administered Medications  Medication Dose Route Frequency Provider Last Rate Last Dose  . 0.9 %  sodium chloride infusion   Intravenous Continuous Georgette Shell, MD 75 mL/hr at 04/25/18 1134    . acetaminophen (TYLENOL) tablet 650 mg  650 mg Oral Q6H PRN Raiford Noble Latif, DO       Or  . acetaminophen (TYLENOL) suppository 650 mg  650 mg Rectal Q6H PRN Raiford Noble Latif, DO      . bisacodyl (DULCOLAX) suppository 10 mg  10 mg Rectal Daily PRN Alfredia Ferguson, Omair Latif, DO      . feeding supplement (ENSURE ENLIVE) (ENSURE ENLIVE) liquid 237 mL  237 mL Oral BID BM Sheikh, [place], DO   237  mL at 04/25/18 0915  . heparin injection 5,000 Units  5,000 Units Subcutaneous [address] [place], DO   5,000 Units at 04/25/18 1434  . HYDROcodone-acetaminophen (NORCO/VICODIN) 5-325 MG per tablet 1 tablet  1 tablet Oral Q6H PRN Raiford Noble [place], DO   1 tablet at 04/24/18 1519  . insulin aspart (novoLOG) injection 0-9 Units  0-9 Units Subcutaneous TID WC SheikhGeorgina Quint [place], DO   1 Units at 04/25/18 1254  . mirtazapine (REMERON) tablet 7.5 mg  7.5 mg Oral QHS Sheikh, Georgina Quint [place], DO   7.5 mg at 04/24/18 2229  . ondansetron (ZOFRAN) tablet 4 mg  4 mg Oral Q6H PRN Raiford Noble Latif, DO       Or  . ondansetron [facility]) injection 4 mg  4 mg Intravenous Q6H PRN Sheikh, Omair  Latif, DO      . sertraline (ZOLOFT) tablet 75 mg  75 mg Oral Daily Raiford Noble [place], DO   75 mg at 04/25/18 0768     Discharge Medications: Please see discharge summary for a list of discharge medications.  Relevant Imaging Results:  Relevant Lab Results:   Additional Information [id number]  Weston Anna, LCSW

## 2018-04-25 NOTE — Clinical Social Work Note (Signed)
Clinical Social Work Assessment  Patient Details  Name: Stephanie Olson MRN: [id number] Date of Birth: 11/09/1940  Date of referral:  04/25/18               Reason for consult:  Facility Placement                Permission sought to share information with:    Permission granted to share information::     Name::        Agency::     Relationship::     Contact Information:     Housing/Transportation Living arrangements for the past 2 months:  Single Family Home Source of Information:  Patient Patient Interpreter Needed:  None Criminal Activity/Legal Involvement Pertinent to Current Situation/Hospitalization:    Significant Relationships:  Adult Children Lives with:  Self Do you feel safe going back to the place where you live?  Yes Need for family participation in patient care:  Yes (Comment)  Care giving concerns:  No concerns voiced by patient at this time.   Social Worker assessment / plan:  CSW met with patient briefly via bedside to discuss disposition plans. PT met with patient and is currently recommending SNF placement once stable for discharge. Patient has no preference's at this time however is agreeable to plan. Patient requested CSW contact son, [name], to discuss plans further however CSW was unable to reach him at number provided on facesheet.   CSW has faxed out information to facilities in the [place] area and will follow up with patient and son.    Employment status:  Retired Nurse, adult PT Recommendations:  [place] / Referral to community resources:  [place]  Patient/Family's Response to care:  Patient appreciated CSW.   Patient/Family's Understanding of and Emotional Response to Diagnosis, Current Treatment, and Prognosis:  Patient currently understands current disposition plans.   Emotional Assessment Appearance:  Appears stated age Attitude/Demeanor/Rapport:    Affect  (typically observed):  Pleasant, Quiet, Accepting Orientation:  Oriented to Self, Oriented to Situation, Oriented to Place, Oriented to  Time Alcohol / Substance use:    Psych involvement (Current and /or in the community):  No (Comment)  Discharge Needs  Concerns to be addressed:  No discharge needs identified Readmission within the last 30 days:  No Current discharge risk:  None Barriers to Discharge:  No Barriers Identified   Weston Anna, LCSW 04/25/2018, 4:16 PM

## 2018-04-25 NOTE — Progress Notes (Signed)
Occupational Therapy Progress Note  Pt seen to assist with toilet transfer - she requires mod A for squat pivot transfer and total A for peri care - pt with loose stool and incontinence.  She requires max cues for problem solving and sequencing.  Repeats requests frequently.    Recommend SNF.     04/25/18 1312  OT Visit Information  Last OT Received On 04/25/18  Assistance Needed +1 (transfers only )  History of Present Illness 77 y.o. female with medical history significant of HTN, DM, PVD, Normocytic Anemia and other comorbids who presents with along with generalized weakness and fatigue.  Patient has been feeling more nauseous and vomiting and refusing to eat for the last week and states that since she fell last Friday she has not been well.  Patient admitted with acute kidney injury and CKD.    Precautions  Precautions Fall  Pain Assessment  Faces Pain Scale 6  Pain Location abdominal   Pain Descriptors / Indicators Aching;Spasm  Pain Intervention(s) Repositioned;Limited activity within patient's tolerance  Cognition  Arousal/Alertness Awake/alert  Behavior During Therapy Flat affect  Overall Cognitive Status Impaired/Different from baseline  Area of Impairment Attention;Following commands;Problem solving  Current Attention Level Sustained  Following Commands Follows one step commands consistently;Follows one step commands with increased time  Problem Solving Slow processing;Decreased initiation;Difficulty sequencing;Requires verbal cues;Requires tactile cues  General Comments Pt repeats self multiple times   Upper Extremity Assessment  Upper Extremity Assessment Generalized weakness  Lower Extremity Assessment  Lower Extremity Assessment Defer to PT evaluation  ADL  Overall ADL's  Needs assistance/impaired  Eating/Feeding Set up;Sitting;Bed level  Toilet Transfer Moderate assistance;Squat-pivot;BSC  Toilet Transfer Details (indicate cue type and reason) Pt requires max cues  and assist to pivot.    Toileting- Clothing Manipulation and Hygiene Total assistance;Sit to/from stand  Toileting - Clothing Manipulation Details (indicate cue type and reason) Pt unable to move fully into standing.  Pt with loose stool.  Very fearful of falling, and difficulty following commands   Functional mobility during ADLs Maximal assistance;Rolling walker  Balance  Overall balance assessment Needs assistance  Sitting-balance support Feet supported  Sitting balance-Leahy Scale Fair  Standing balance support Bilateral upper extremity supported  Standing balance-Leahy Scale Poor  Standing balance comment requires mod A and bil. Ue support   Transfers  Overall transfer level Needs assistance  Transfers Squat Pivot Transfers  Sit to Stand Mod assist  Squat pivot transfers Mod assist  General transfer comment max verbal cues for problem solving and sequencing.  Assist to transfer   General Comments  General comments (skin integrity, edema, etc.) Pt with urgency due to loose stool   OT - End of Session  Equipment Utilized During Treatment Rolling walker  Activity Tolerance Patient limited by fatigue  Patient left in chair;with call bell/phone within reach;with chair alarm set  Nurse Communication Mobility status  OT Assessment/Plan  OT Plan Discharge plan remains appropriate  OT Visit Diagnosis Unsteadiness on feet (R26.81)  OT Frequency (ACUTE ONLY) Min 2X/week  Follow Up Recommendations SNF;Supervision/Assistance - 24 hour  OT Equipment None recommended by OT  AM-PAC OT "6 Clicks" Daily Activity Outcome Measure  Help from another person eating meals? 3  Help from another person taking care of personal grooming? 3  Help from another person toileting, which includes using toliet, bedpan, or urinal? 2  Help from another person bathing (including washing, rinsing, drying)? 2  Help from another person to put on and taking off regular upper  body clothing? 2  Help from another person  to put on and taking off regular lower body clothing? 1  6 Click Score 13  ADL G Code Conversion CL  ADL Goals  Pt Will Perform Grooming with set-up;sitting  Pt Will Perform Upper Body Bathing with set-up;sitting  Pt Will Perform Lower Body Bathing with mod assist;sit to/from stand  Pt Will Transfer to Toilet with min assist;stand pivot transfer;bedside commode  Pt Will Perform Toileting - Clothing Manipulation and hygiene with mod assist;sit to/from stand  OT Time Calculation  OT Start Time (ACUTE ONLY) 1313  OT Stop Time (ACUTE ONLY) 1330  OT Time Calculation (min) 17 min  OT Treatments  $Self Care/Home Management  8-22 mins  [facility], OTR/L [phone]

## 2018-04-25 NOTE — Care Management Important Message (Signed)
Important Message  Patient Details  Name: EARNEST THALMAN MRN: [id number] Date of Birth: 17-Apr-1941   Medicare Important Message Given:  Yes    Kerin Salen 04/25/2018, 12:41 [place] Message  Patient Details  Name: RALIYAH MONTELLA MRN: [id number] Date of Birth: 29-Jul-1941   Medicare Important Message Given:  Yes    Kerin Salen 04/25/2018, 12:41 PM

## 2018-04-25 NOTE — Progress Notes (Signed)
  Speech Language Pathology Treatment: Dysphagia  Patient Details Name: Stephanie Olson MRN: [id number] DOB: 07-08-1941 Today's Date: 04/25/2018 Time: 1115-1205 SLP Time Calculation (min) (ACUTE ONLY): 50 min  Assessment / Plan / Recommendation Clinical Impression  Pt alert and able to follow some directions today.  She admits to her gums hurting and states foods "don't taste good".  She denies odynophagia nor dysphagia and states she has a good appetite.  SLP examined oral cavity - cleaning pt's dentures and placing adhesive on them.  Oral cavity clear with minimal redness on hard palate.   Given pt has experienced significant weight loss and she always used adhesive - her dentures are likely looser now - Asked family to bring in denture adhesive- to which Stephanie Olson states she will provide for pt.   Pt able to self feed today - accepting intake of chicken noodle soup, cheerios, Ensure and water.  She did demonstrate delayed oral transiting but appeared with good oral control.  NO indication of airway compromise with po consumed.  Pt did benefit from verbal cues to use liquids to aid oral clearance of solid boluses.  Swishing and swallowing milk to clear cheerios from oral cavity.    CN exam unremarkable thus causing SLP to suspect pt without true dysphagia - suspect intake poor/oral holding is due to gustatory changes per pt statement.  Recommend to advance diet to allow her food choices using compensations to assure oral clearance.  Extensive education completed with pt and her daughter in law Stephanie Olson.   HPI HPI: Patient is a 77 y.o. female with PMH: HTN, DM, PVD, normocytic anemia who presented to hospital with c/o poor appetite with some lower abdomen pain, difficulty urinating. Patient with suspected aspiration PNA on admission and is being treated with IV antibiotics.  Pt has been seen by SLP during admit and changed to dys2/thin diet - no full liquids.  Follow up to assess toelrance, readiness for  dietary advancement.       SLP Plan  Consult other service (comment)       Recommendations  Diet recommendations: Thin liquid;Dysphagia 3 (mechanical soft) Liquids provided via: Cup;Straw Medication Administration: Whole meds with puree Supervision: Patient able to self feed;Full supervision/cueing for compensatory strategies Compensations: Minimize environmental distractions;Follow solids with liquid(swish and expectorate with water after meals, expectorate food if pt not clearing it) Postural Changes and/or Swallow Maneuvers: Seated upright 90 degrees;Upright 30-60 min after meal                Oral Care Recommendations: Oral care BID Follow up Recommendations: Skilled Nursing facility SLP Visit Diagnosis: Dysphagia, oropharyngeal phase (R13.12) Plan: Consult other service (comment)       GO               Stephanie Olson, [place] [facility] SLP [phone]  Stephanie Olson 04/25/2018, 12:36 PM

## 2018-04-25 NOTE — Progress Notes (Signed)
Assumed care of patient from [facility]. Received bedside report. Agree with previous RN assessment. Will continue to monitor.

## 2018-04-25 NOTE — Progress Notes (Signed)
PROGRESS NOTE    Stephanie Olson  IRW:[id number] DOB: 1941/09/04 DOA: 04/22/2018 PCP: Benito Mccreedy, MD  Brief Narrative:77 y.o.femalewith medical history significant ofHTN, DM, PVD, Normocytic Anemia and other comorbids who presents withalong with generalized weakness and fatigue. Patient has been feeling more nauseous and vomiting and refusing to eat for the last week and states that since she fell last Friday she has not been well. Denies any fevers or chills. Her main complaint is her very poor appetite. Denies chest pain, lightheadedness or dizziness. Did admit to having some lower abdominal pain. Also stated that she is has extreme difficulty urinating and states that she has not been urinating as much. No other concerns or complaints at this time TRH was called to admit this patient for generalized weakness and fatigue along with acute kidney injury on chronic kidney disease.  ED Course:The ED basic blood work was done patient was given 1 L normal saline bolus along with IV Zofran and started on IV vancomycin IV cefepime for suspected aspiration pneumonia   Assessment & Plan:   Active Problems:   PVD (peripheral vascular disease) (HCC)   Diabetic neuropathy (HCC)   Renal failure (ARF), acute on chronic (HCC)   Essential hypertension   Normocytic anemia   Metabolic acidosis, normal anion gap (NAG)   Hyperkalemia   Aspiration into airway   Generalized weakness   Lethargy   Poor appetite   Failure to thrive in adult   Pressure injury of skin   Acute renal failure (HCC)   Urinary retention   AKI (acute kidney injury) (HCC)   Malnutrition of moderate degree  1]AKI on CKD stage III-secondary to hypotension and urinary retention  neurogenic bladder renal functions improving after Foley catheter was placed creatinine down to 2.60  from 8.0 yesterday. Potassium 4.9 Patient is awake alert oriented to the place. Renal ultrasound bilateral hydronephrosis appears  improved following Foley catheter placement of the kidneys demonstrate cortical thinning. CT scan done prior to the ultrasound showed markedly distended urinary bladder suggesting possible neurogenic bladder or bladder outlet obstruction. Monitor renal functions closely.  Urine output was over 4.7 L since admission.   2]hypertension blood pressure soft DC Norvasc start normal saline at 75 cc an hour.  3]?Aspiration pneumonia patient is on room air saturation above 92% with no complaints or symptoms of cough shortness of breath at this time. No fever chills reported.will dc zosynand monitor.1/2 blood culture coag neg staph.  Patient continues to do well on room air denied any complaints of shortness of breath today.  4]peripheral vascular disease stable.  She was not taking Plavix at the nursing home.     DVT prophylaxis: Heparin Code Status: Full code Family Communication: Discussed with daughter-in-law in detail Disposition Plan plan is to discharge her back to his nursing home once renal function improves further. Consultants:  None Procedures: None Antimicrobials: None  Subjective: Awake alert oriented to hospital denied any pain shortness of breath however she tends to pocket food to want to eat has decreased appetite.   Objective: Vitals:   04/24/18 1328 04/24/18 2121 04/25/18 0415 04/25/18 0455  BP: 122/78 127/84  110/76  Pulse: (!) 111 97  97  Resp: 16 12  10   Temp: 98.7 F (37.1 C) 98.2 F (36.8 C)  98.7 F (37.1 C)  TempSrc: Oral Oral  Oral  SpO2: 99% 100%  99%  Weight:   60.9 kg   Height:        Intake/Output Summary (Last 24  hours) at 04/25/2018 1214 Last data filed at 04/25/2018 0500 Gross per 24 hour  Intake 1809.41 ml  Output 1525 ml  Net 284.41 ml   Filed Weights   04/22/18 1341 04/24/18 0527 04/25/18 0415  Weight: 61.2 kg 61.3 kg 60.9 kg    Examination: She is much more awake and alert today with her daughter-in-law by the bedside.  She  answered all my questions appropriately.  General exam: Appears calm and comfortable  Respiratory system: Clear to auscultation. Respiratory effort normal. Cardiovascular system: S1 & S2 heard, RRR. No JVD, murmurs, rubs, gallops or clicks. No pedal edema. Gastrointestinal system: Abdomen is nondistended, soft and nontender. No organomegaly or masses felt. Normal bowel sounds heard. Central nervous system: Alert and oriented. No focal neurological deficits. Extremities: Symmetric 5 x 5 power. Skin: No rashes, lesions or ulcers     Data Reviewed: I have personally reviewed following labs and imaging studies  CBC: Recent Labs  Lab 04/22/18 1453 04/22/18 1502 04/23/18 0418  WBC 12.7*  --  9.9  NEUTROABS 8.7*  --   --   HGB 10.2* 10.9* 10.8*  HCT 32.6* 32.0* 34.1*  MCV 87.2  --  86.8  PLT 455*  --  710*   Basic Metabolic Panel: Recent Labs  Lab 04/22/18 1451 04/22/18 1453 04/22/18 1502 04/23/18 0418 04/24/18 0413 04/25/18 0428  NA  --  136 133* 137 141 144  K  --  5.8* 5.7* 5.6* 5.4* 4.9  CL  --  106 108 107 110 111  CO2  --  15*  --  21* 22 23  GLUCOSE  --  92 90 152* 88 96  BUN  --  109* 100* 90* 76* 58*  CREATININE  --  8.15* 8.30* 5.92* 4.01* 2.60*  CALCIUM  --  8.2*  --  8.3* 7.9* 8.3*  MG 2.5*  --   --   --   --   --   PHOS 6.1*  --   --   --   --   --    GFR: Estimated Creatinine Clearance: 18 mL/min (A) (by C-G formula based on SCr of 2.6 mg/dL (H)). Liver Function Tests: Recent Labs  Lab 04/22/18 1453 04/23/18 0418  AST 15 15  ALT 13 14  ALKPHOS 80 82  BILITOT 0.6 0.5  PROT 6.4* 6.5  ALBUMIN 2.6* 2.7*   No results for input(s): LIPASE, AMYLASE in the last 168 hours. No results for input(s): AMMONIA in the last 168 hours. Coagulation Profile: No results for input(s): INR, PROTIME in the last 168 hours. Cardiac Enzymes: No results for input(s): CKTOTAL, CKMB, CKMBINDEX, TROPONINI in the last 168 hours. BNP (last 3 results) No results for  input(s): PROBNP in the last 8760 hours. HbA1C: Recent Labs    04/23/18 0418  HGBA1C 6.8*   CBG: Recent Labs  Lab 04/24/18 1644 04/24/18 2125 04/24/18 2319 04/25/18 0922 04/25/18 1209  GLUCAP 144* 66* 111* 94 145*   Lipid Profile: No results for input(s): CHOL, HDL, LDLCALC, TRIG, CHOLHDL, LDLDIRECT in the last 72 hours. Thyroid Function Tests: Recent Labs    04/23/18 0418  TSH 0.945   Anemia Panel: No results for input(s): VITAMINB12, FOLATE, FERRITIN, TIBC, IRON, RETICCTPCT in the last 72 hours. Sepsis Labs: Recent Labs  Lab 04/22/18 1501  LATICACIDVEN 0.55    Recent Results (from the past 240 hour(s))  Urine culture     Status: Abnormal   Collection Time: 04/22/18  1:29 PM  Result Value Ref  Range Status   Specimen Description   Final    URINE, RANDOM Performed at [facility], [place] [address] [place], [place] [address]    Special Requests   Final    NONE Performed at [facility], [place] [address], [place], [place] [address]    Culture 50,000 COLONIES/mL KLEBSIELLA PNEUMONIAE (A)  Final   Report Status 04/24/2018 FINAL  Final   Organism ID, Bacteria KLEBSIELLA PNEUMONIAE (A)  Final      Susceptibility   Klebsiella pneumoniae - MIC*    AMPICILLIN >=32 RESISTANT Resistant     CEFAZOLIN <=4 SENSITIVE Sensitive     CEFTRIAXONE <=1 SENSITIVE Sensitive     CIPROFLOXACIN <=0.25 SENSITIVE Sensitive     GENTAMICIN <=1 SENSITIVE Sensitive     IMIPENEM <=0.25 SENSITIVE Sensitive     NITROFURANTOIN 64 INTERMEDIATE Intermediate     TRIMETH/SULFA <=20 SENSITIVE Sensitive     AMPICILLIN/SULBACTAM 4 SENSITIVE Sensitive     PIP/TAZO <=4 SENSITIVE Sensitive     Extended ESBL NEGATIVE Sensitive     * 50,000 COLONIES/mL KLEBSIELLA PNEUMONIAE  Blood culture (routine x 2)     Status: None (Preliminary result)   Collection Time: 04/22/18  2:53 PM  Result Value Ref Range Status   Specimen Description   Final    BLOOD LEFT  ARM Performed at [place] [address], [place], [place] [address]    Special Requests   Final    BOTTLES DRAWN AEROBIC AND ANAEROBIC Blood Culture adequate volume Performed at [place] [address] [place], [place] [address]    Culture   Final    NO GROWTH 2 DAYS Performed at [place] [address] [place], [place] [address]    Report Status PENDING  Incomplete  Blood culture (routine x 2)     Status: Abnormal   Collection Time: 04/22/18  3:35 PM  Result Value Ref Range Status   Specimen Description   Final    BLOOD RIGHT HAND Performed at [facility] [address], [place], [place] [address]    Special Requests   Final    BOTTLES DRAWN AEROBIC AND ANAEROBIC Blood Culture results may not be optimal due to an excessive volume of blood received in culture bottles Performed at [place] [address] [place], [place] [address]    Culture  Setup Time   Final    ANAEROBIC BOTTLE ONLY GRAM POSITIVE COCCI CRITICAL RESULT CALLED TO, READ BACK BY AND VERIFIED WITH: LEANN POINDEXTER @ 6967 ON 04/23/18 BY ROBINSON Z.    Culture (A)  Final    STAPHYLOCOCCUS SPECIES (COAGULASE NEGATIVE) THE SIGNIFICANCE OF ISOLATING THIS ORGANISM FROM A SINGLE SET OF BLOOD CULTURES WHEN MULTIPLE SETS ARE DRAWN IS UNCERTAIN. PLEASE NOTIFY THE MICROBIOLOGY DEPARTMENT WITHIN ONE WEEK IF SPECIATION AND SENSITIVITIES ARE REQUIRED. Performed at [facility], [place] [address], [place],  [address]    Report Status 04/25/2018 FINAL  Final  Blood Culture ID Panel (Reflexed)     Status: Abnormal   Collection Time: 04/22/18  3:35 PM  Result Value Ref Range Status   Enterococcus species NOT DETECTED NOT DETECTED Final   Listeria monocytogenes NOT DETECTED NOT DETECTED Final   Staphylococcus species DETECTED (A) NOT DETECTED Final    Comment: Methicillin (oxacillin) resistant coagulase negative  staphylococcus. Possible blood culture contaminant (unless isolated from more than one blood culture draw or clinical case suggests  pathogenicity). No antibiotic treatment is indicated for blood  culture contaminants. CRITICAL RESULT CALLED TO, READ BACK BY AND VERIFIED WITH: LEANN POINDEXTER @ 3383 ON 04/23/18 BY ROBINSON Z.     Staphylococcus aureus NOT DETECTED NOT DETECTED Final   Methicillin resistance DETECTED (A) NOT DETECTED Final    Comment: CRITICAL RESULT CALLED TO, READ BACK BY AND VERIFIED WITH: LEANN POINDEXTER @ 2919 ON 04/23/18 BY ROBINSON Z.     Streptococcus species NOT DETECTED NOT DETECTED Final   Streptococcus agalactiae NOT DETECTED NOT DETECTED Final   Streptococcus pneumoniae NOT DETECTED NOT DETECTED Final   Streptococcus pyogenes NOT DETECTED NOT DETECTED Final   Acinetobacter baumannii NOT DETECTED NOT DETECTED Final   Enterobacteriaceae species NOT DETECTED NOT DETECTED Final   Enterobacter cloacae complex NOT DETECTED NOT DETECTED Final   Escherichia coli NOT DETECTED NOT DETECTED Final   Klebsiella oxytoca NOT DETECTED NOT DETECTED Final   Klebsiella pneumoniae NOT DETECTED NOT DETECTED Final   Proteus species NOT DETECTED NOT DETECTED Final   Serratia marcescens NOT DETECTED NOT DETECTED Final   Haemophilus influenzae NOT DETECTED NOT DETECTED Final   Neisseria meningitidis NOT DETECTED NOT DETECTED Final   Pseudomonas aeruginosa NOT DETECTED NOT DETECTED Final   Candida albicans NOT DETECTED NOT DETECTED Final   Candida glabrata NOT DETECTED NOT DETECTED Final   Candida krusei NOT DETECTED NOT DETECTED Final   Candida parapsilosis NOT DETECTED NOT DETECTED Final   Candida tropicalis NOT DETECTED NOT DETECTED Final         Radiology Studies: No results found.      Scheduled Meds: . feeding supplement (ENSURE ENLIVE)  237 mL Oral BID BM  . heparin  5,000 Units Subcutaneous Q8H  . insulin aspart  0-9 Units Subcutaneous TID WC  . mirtazapine   7.5 mg Oral QHS  . sertraline  75 mg Oral Daily   Continuous Infusions: . sodium chloride 75 mL/hr at 04/25/18 1134     LOS: 3 days    Georgette Shell, MD [facility]  If 7PM-7AM, please contact night-coverage [email or web address] Password [facility] 04/25/2018, 12:14 PM

## 2018-04-25 NOTE — Progress Notes (Signed)
Patient noted to have 2 BM's that were loose, brown, and had cherry red streaks. Patient also c/o abd cramping. MD made aware. Orders received to collect an Occult stool, give imodium, and check a CBC in the AM. Will carry out orders.

## 2018-04-26 LAB — CBC
HCT: 30.4 % — ABNORMAL LOW (ref 36.0–46.0)
Hemoglobin: 9.4 g/dL — ABNORMAL LOW (ref 12.0–15.0)
MCH: 27.2 pg (ref 26.0–34.0)
MCHC: 30.9 g/dL (ref 30.0–36.0)
MCV: 87.9 fL (ref 78.0–100.0)
PLATELETS: 371 10*3/uL (ref 150–400)
RBC: 3.46 MIL/uL — ABNORMAL LOW (ref 3.87–5.11)
RDW: 18.6 % — ABNORMAL HIGH (ref 11.5–15.5)
WBC: 12 10*3/uL — ABNORMAL HIGH (ref 4.0–10.5)

## 2018-04-26 LAB — GLUCOSE, CAPILLARY
GLUCOSE-CAPILLARY: 93 mg/dL (ref 70–99)
GLUCOSE-CAPILLARY: 153 mg/dL — AB (ref 70–99)
Glucose-Capillary: 101 mg/dL — ABNORMAL HIGH (ref 70–99)
Glucose-Capillary: 195 mg/dL — ABNORMAL HIGH (ref 70–99)

## 2018-04-26 LAB — BASIC METABOLIC PANEL
Anion gap: 5 (ref 5–15)
BUN: 39 mg/dL — AB (ref 8–23)
CALCIUM: 7.8 mg/dL — AB (ref 8.9–10.3)
CHLORIDE: 113 mmol/L — AB (ref 98–111)
CO2: 24 mmol/L (ref 22–32)
CREATININE: 1.79 mg/dL — AB (ref 0.44–1.00)
GFR calc Af Amer: 31 mL/min — ABNORMAL LOW (ref >60)
GFR calc non Af Amer: 27 mL/min — ABNORMAL LOW (ref >60)
GLUCOSE: 116 mg/dL — AB (ref 70–99)
Potassium: 4.5 mmol/L (ref 3.5–5.1)
Sodium: 142 mmol/L (ref 135–145)

## 2018-04-26 MED ORDER — LOPERAMIDE HCL 2 MG PO CAPS: 2.0000 mg | ORAL_CAPSULE | Freq: Four times a day (QID) | ORAL | 0 refills | Status: DC | PRN

## 2018-04-26 MED ORDER — BISACODYL 10 MG RE SUPP: 10.0000 mg | Freq: Every day | RECTAL | 0 refills | Status: AC | PRN

## 2018-04-26 MED ORDER — ONDANSETRON HCL 4 MG PO TABS: 4.0000 mg | ORAL_TABLET | Freq: Four times a day (QID) | ORAL | 0 refills | Status: DC | PRN

## 2018-04-26 NOTE — Progress Notes (Signed)
CSW following to assist with discharge planning to [facility].  CSW followed up with [facility] SNF to inquire about patient's insurance authorization, staff reported that it is still pending.   CSW updated patient's daughter-in-law.   Patient needs [facility] Medicare insurance authorization to discharge to SNF.  CSW will continue to follow and assist with discharge planning.  Abundio Miu, [place] Social Worker [facility] Cell#: [phone]

## 2018-04-26 NOTE — Progress Notes (Signed)
CSW following to assist with discharge planning to SNF. CSW attempted to provide bed offers to patient, patient disoriented to situation and reported that she is "lost". CSW contacted patient's son Lonzo Cloud [phone]) to provide bed offers, no answer. CSW left voicemail requesting return phone call. CSW contacted patient's son Montel Clock [phone]), no answer. CSW left voicemail requesting return phone call. CSW contacted patient's granddaughter Ellan Lambert [phone]), no answer nor option to leave a voicemail.   CSW received a return call from [phone]) and a lady reported that she has received several calls from [facility] and that CSW has the wrong number.   CSW contacted [facility] and requested contact information for patient's son, staff reported [phone], [phone]) are the numbers they have on file for patient's son.  CSW contacted patient's son Lonzo Cloud [phone]), patient's son's wife answered Glennis Brink) and spoke with CSW about discharge planning. CSW provided bed offers, patient's daughter in law selected [facility]. Patient's daughter reported that they plan to transport patient after work today around (6:30 PM).  CSW contacted [facility] and spoke with staff member Irine Seal who confirmed bed offer and agreed to start [facility] Medicare authorization.  CSW updated patient's daughter in law.  Patient needs [facility] Medicare Insurance authorization to dc to SNF. CSW will continue to follow and assist with discharge planning.   Abundio Miu, [place] Social Worker [facility] Cell#: [phone]

## 2018-04-26 NOTE — Progress Notes (Signed)
CSW followed up with [facility] SN[facility] to inquire about patient's insurance authorization. Staff reported that patient's insurance authorization is still pending. CSW updated patient's daughter-in-law, patient's daughter-in-law reported that they are unable to care for patient in the home.  Patient needs insurance authorization to discharge to SNF. CSW will continue to follow and assist with discharge planning.  Abundio Miu, [place] Social Worker [facility] Cell#: [phone]

## 2018-04-26 NOTE — Progress Notes (Signed)
Physical Therapy Treatment Patient Details Name: Stephanie Olson MRN: [id number] DOB: 06-01-1941 Today's Date: 04/26/2018    History of Present Illness 77 y.o. female with medical history significant of HTN, DM, PVD, Normocytic Anemia and other comorbids who presents with along with generalized weakness and fatigue.  Patient has been feeling more nauseous and vomiting and refusing to eat for the last week and states that since she fell last Friday she has not been well.  Patient admitted with acute kidney injury and CKD.      PT Comments    Assisted pt OOB to recliner required Max Assist and increased time with each position change.  Attempted sit to stand with walker however pt was unable to off load buttocks from bed due to weakness.  Performed "Bear Hug" stand pivot 1/4 turn from elevated bed to recliner.  Positioned in recliner to comfort.   Follow Up Recommendations  (at her ALF)  [facility] PT   Equipment Recommendations  None recommended by PT    Recommendations for Other Services       Precautions / Restrictions Precautions Precautions: Fall Restrictions Weight Bearing Restrictions: No    Mobility  Bed Mobility Overal bed mobility: Needs Assistance Bed Mobility: Supine to Sit     Supine to sit: Mod assist     General bed mobility comments: increased time and use of bed pad to complete scooting to EOB  Transfers Overall transfer level: Needs assistance Equipment used: Rolling walker (2 wheeled);None Transfers: Stand Pivot Transfers Sit to Stand: (unable to clear hips off bed) Stand pivot transfers: Max assist       General transfer comment: attempted sit to stand with walker however pt unable to stand off load buttocks off bed so performed stand pivot "Bear Hug" 1/4 turn from elevated bed to recliner.   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    Modified Rankin (Stroke Patients Only)       Balance                                            Cognition Arousal/Alertness: Awake/alert Behavior During Therapy: WFL for tasks assessed/performed Overall Cognitive Status: Within Functional Limits for tasks assessed                                 General Comments: pleasant       Exercises      General Comments        Pertinent Vitals/Pain Pain Assessment: No/denies pain    Home Living                      Prior Function            PT Goals (current goals can now be found in the care plan section) Progress towards PT goals: Progressing toward goals    Frequency    Min 2X/week      PT Plan Current plan remains appropriate    Co-evaluation              AM-PAC PT "6 Clicks" Daily Activity  Outcome Measure  Difficulty turning  over in bed (including adjusting bedclothes, sheets and blankets)?: A Lot Difficulty moving from lying on back to sitting on the side of the bed? : A Lot Difficulty sitting down on and standing up from a chair with arms (e.g., wheelchair, bedside commode, etc,.)?: A Lot Help needed moving to and from a bed to chair (including a wheelchair)?: A Lot Help needed walking in hospital room?: A Lot Help needed climbing 3-5 steps with a railing? : Total 6 Click Score: 11    End of Session Equipment Utilized During Treatment: Gait belt Activity Tolerance: Patient tolerated treatment well Patient left: in chair;with chair alarm set;with call bell/phone within reach Nurse Communication: Mobility status PT Visit Diagnosis: Other abnormalities of gait and mobility (R26.89);History of falling (Z91.81);Muscle weakness (generalized) (M62.81)     Time: 1000-1020 PT Time Calculation (min) (ACUTE ONLY): 20 min  Charges:  $Therapeutic Activity: 8-22 mins                     Rica Koyanagi  PTA [facility]  Acute  Rehab Pager      [phone]

## 2018-04-26 NOTE — Discharge Summary (Signed)
Physician Discharge Summary  Stephanie Olson QQV:[id number] DOB: 14-Jul-1941 DOA: 04/22/2018  PCP: Benito Mccreedy, MD  Admit date: 04/22/2018 Discharge date: 04/26/2018  Admitted From: Nursing home Disposition: Nursing home Recommendations for Outpatient Follow-up:  1. Follow up with PCP in 1-2 weeks 2. Please obtain BMP/CBC in one week 3. [facility]-up with [facility] for urinary retention and Foley placement please call them for appointment.  Home Health: None Equipment/Devices: None Discharge Condition: Stable CODE STATUS full code Diet recommendation: Regular diet Brief/Interim Summary:76 y.o.femalewith medical history significant ofHTN, DM, PVD, Normocytic Anemia and other comorbids who presents withalong with generalized weakness and fatigue. Patient has been feeling more nauseous and vomiting and refusing to eat for the last week and states that since she fell last Friday she has not been well. Denies any fevers or chills. Her main complaint is her very poor appetite. Denies chest pain, lightheadedness or dizziness. Did admit to having some lower abdominal pain. Also stated that she is has extreme difficulty urinating and states that she has not been urinating as much. No other concerns or complaints at this time [facility] was called to admit this patient for generalized weakness and fatigue along with acute kidney injury on chronic kidney disease.  ED Course:The ED basic blood work was done patient was given 1 L normal saline bolus along with IV Zofran and started on IV vancomycin IV cefepime for suspected aspiration pneumonia   Discharge Diagnoses:  Active Problems:   PVD (peripheral vascular disease) (HCC)   Diabetic neuropathy (HCC)   Renal failure (ARF), acute on chronic (HCC)   Essential hypertension   Normocytic anemia   Metabolic acidosis, normal anion gap (NAG)   Hyperkalemia   Aspiration into airway   Generalized weakness   Lethargy   Poor appetite    Failure to thrive in adult   Pressure injury of skin   Acute renal failure (HCC)   Urinary retention   AKI (acute kidney injury) ([place])   Malnutrition of moderate degree  1]AKI on CKD stage III-secondary to hypotension andurinary retentionneurogenic bladder renal functions improving after Foley catheter was placed creatinine down to 1.79 on the day of discharge.  On the day of admission her creatinine was 8.0.  Potassium is 4.5 on the day of discharge. Renal ultrasound bilateral hydronephrosis appears improved following Foley catheter placement of the kidneys demonstrate cortical thinning. CT scan done prior to the ultrasound showed markedly distended urinary bladder suggesting possible neurogenic bladder or bladder outlet obstruction. Monitor renal functions closely.Urine output was over 4.7 L since admission.   2]hypertensionblood pressure soft DC Norvasc   4]peripheral vascular disease stable.  She was not taking Plavix at the nursing home  5]Anemia of chronic disease her hemoglobin remained stable though at the time of admission she was 10.9 came down to 9.4 on the day of discharge secondary tohemo dilution from fluid resuscitation. Discharge Instructions  Discharge Instructions    Call MD for:  difficulty breathing, headache or visual disturbances   Complete by:  As directed    Call MD for:  persistant dizziness or light-headedness   Complete by:  As directed    Call MD for:  persistant nausea and vomiting   Complete by:  As directed    Call MD for:  severe uncontrolled pain   Complete by:  As directed    Call MD for:  temperature >100.4   Complete by:  As directed    Diet - low sodium heart healthy   Complete by:  As directed    Increase activity slowly   Complete by:  As directed      Allergies as of 04/26/2018   No Known Allergies     Medication List    STOP taking these medications   amLODipine 5 MG tablet Commonly known as:  NORVASC   clopidogrel 75 MG  tablet Commonly known as:  PLAVIX   cyclobenzaprine 5 MG tablet Commonly known as:  FLEXERIL   promethazine 25 MG tablet Commonly known as:  PHENERGAN     TAKE these medications   acetaminophen 500 MG tablet Commonly known as:  TYLENOL Take 500 mg by mouth every 6 (six) hours as needed.   BISACODYL PO Take 10 mg by mouth as needed (constipation). What changed:  Another medication with the same name was added. Make sure you understand how and when to take each.   bisacodyl 10 MG suppository Commonly known as:  DULCOLAX Place 1 suppository (10 mg total) rectally daily as needed for moderate constipation. What changed:  You were already taking a medication with the same name, and this prescription was added. Make sure you understand how and when to take each.   ENSURE PLUS Liqd Take 237 mLs by mouth 2 (two) times daily.   HYDROcodone-acetaminophen 5-325 MG tablet Commonly known as:  NORCO/VICODIN Take 1 tablet by mouth every 6 (six) hours as needed for moderate pain.   loperamide 2 MG capsule Commonly known as:  IMODIUM Take 1 capsule (2 mg total) by mouth every 6 (six) hours as needed for diarrhea or loose stools.   Melatonin 3 MG Tabs Take 3 mg by mouth at bedtime.   mirtazapine 7.5 MG tablet Commonly known as:  REMERON Take 7.5 mg by mouth at bedtime.   ondansetron 4 MG tablet Commonly known as:  ZOFRAN Take 1 tablet (4 mg total) by mouth every 6 (six) hours as needed for nausea.   sertraline 50 MG tablet Commonly known as:  ZOLOFT Take 75 mg by mouth daily.      Follow-up Information    Benito Mccreedy, MD Follow up.   Specialty:  Internal Medicine Contact information: [address] [address] [phone]        [facility] Follow up.   Contact information: [place] [phone]         No Known Allergies  Consultations: none  Procedures/Studies: Ct Abdomen  Pelvis Wo Contrast  Result Date: 04/22/2018 CLINICAL DATA:  Weakness, lethargy and decreased appetite. Nausea. Remote abdominal surgery, unspecified. EXAM: CT ABDOMEN AND PELVIS WITHOUT CONTRAST TECHNIQUE: Multidetector CT imaging of the abdomen and pelvis was performed following the standard protocol without IV contrast. COMPARISON:  CT 03/03/2010. FINDINGS: Lower chest: There are streaky left of the right lower lobe airspace opacities which could reflect atelectasis, although are suspicious for possible aspiration. There is a small left pleural effusion. No significant pericardial fluid. There is aortic and coronary artery atherosclerosis. Hepatobiliary: No focal hepatic abnormality is identified on noncontrast imaging. There is no significant biliary dilatation. The gallbladder is significantly distended, similar to the previous study. There is no evidence gallbladder wall thickening or surrounding inflammation. Multiple dependent small gallstones are present in the gallbladder lumen. Pancreas: Atrophied without focal abnormality, ductal dilatation or surrounding inflammation. Spleen: Normal in size. Small calcified granuloma. No other focal abnormality. Adrenals/Urinary Tract: Stable mild thickening of both adrenal glands without focal nodule. Both kidneys demonstrate mild cortical thinning and at  least moderate hydronephrosis and hydroureter, worse on the right. There is mild asymmetric perinephric soft tissue stranding on the right. No ureteral calculus or focal mass lesion identified. The urinary bladder is markedly distended without apparent focal abnormality. Stomach/Bowel: No evidence of bowel wall thickening, distention or surrounding inflammatory change. Mild proximal colonic diverticulosis. The appendix appears normal. Vascular/Lymphatic: There are no enlarged abdominal or pelvic lymph nodes. Aortic and branch vessel atherosclerosis. Reproductive: The uterus and ovaries appear normal. No adnexal mass.  Other: No evidence of abdominal wall mass or hernia. No ascites. Musculoskeletal: No acute or significant osseous findings. T11 Schmorl's node and lower lumbar spondylosis noted. IMPRESSION: 1. Markedly distended urinary bladder, suggesting possible neurogenic bladder versus bladder outlet obstruction. Associated bilateral hydronephrosis and hydroureter, likely secondary to the bladder distention. Consider Foley catheter placement. Follow-up renal ultrasound recommended after voiding or bladder catheterization to document decompression of the bilateral collecting systems. No urinary tract calculi seen. 2. Gallbladder hydrops with dependent stones, similar to previous studies. No gallbladder wall thickening or pericholecystic inflammation identified. 3. Patchy left-greater-than-right basilar airspace opacities suspicious for aspiration. Chest radiographs are pending. 4. Coronary and Aortic Atherosclerosis (ICD10-I70.0). Electronically Signed   By: Richardean Sale M.D.   On: 04/22/2018 14:28   Dg Chest 2 View  Result Date: 04/23/2018 CLINICAL DATA:  Pneumonia EXAM: CHEST - 2 VIEW COMPARISON:  04/22/2018 FINDINGS: Similar mild bandlike opacity in the left lower lobe over the diaphragm more compatible with atelectasis. Right lung remains clear. No significant airspace opacity, collapse or consolidation. No enlarging effusion or pneumothorax. Trachea is midline. Normal heart size and vascularity. Degenerative changes of the shoulders. IMPRESSION: Left basilar atelectasis. Electronically Signed   By: Jerilynn Mages.  Shick M.D.   On: 04/23/2018 10:25   Dg Chest 2 View  Result Date: 04/22/2018 CLINICAL DATA:  Lethargy and nausea EXAM: CHEST - 2 VIEW COMPARISON:  April 11, 2018 FINDINGS: There is atelectatic change in the left base. The lungs elsewhere are clear. Heart size and pulmonary vascularity are normal. No adenopathy. No bone lesions. IMPRESSION: Left base atelectasis. Earliest changes of pneumonia in this area cannot be  excluded. Lungs elsewhere clear. Heart size normal. No evident adenopathy. Electronically Signed   By: Lowella Grip III M.D.   On: 04/22/2018 14:29   Dg Chest 2 View  Result Date: 04/11/2018 CLINICAL DATA:  Weakness. EXAM: CHEST - 2 VIEW COMPARISON:  None. FINDINGS: The heart size and mediastinal contours are within normal limits. Both lungs are clear except for a tiny area of atelectasis at the left base laterally. No effusions. No acute bone abnormality. 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 IMPRESSION: Minimal atelectasis at the left lung base. Electronically Signed   By: Lorriane Shire M.D.   On: 04/11/2018 17:01   Ct Head Wo Contrast  Result Date: 04/11/2018 CLINICAL DATA:  Fall with head trauma EXAM: CT HEAD WITHOUT CONTRAST TECHNIQUE: Contiguous axial images were obtained from the base of the skull through the vertex without intravenous contrast. COMPARISON:  None. FINDINGS: Brain: There is no mass, hemorrhage or extra-axial collection. The size and configuration of the ventricles and extra-axial CSF spaces are normal. There is an old left basal ganglia lacunar infarct. There is hypoattenuation of the periventricular white matter, most commonly indicating chronic ischemic microangiopathy. Vascular: No abnormal hyperdensity of the major intracranial arteries or dural venous sinuses. No intracranial atherosclerosis. Skull: The visualized skull base, calvarium and extracranial soft tissues are normal. Sinuses/Orbits: No  fluid levels or advanced mucosal thickening of the visualized paranasal sinuses. No mastoid or middle ear effusion. The orbits are normal. IMPRESSION: 1. No acute intracranial abnormality. 2. 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 Electronically Signed   By: Ulyses Jarred M.D.   On: 04/11/2018 17:18   US Renal  Result Date: 04/22/2018 CLINICAL DATA:  Acute kidney injury.  EXAM: RENAL / URINARY TRACT ULTRASOUND COMPLETE COMPARISON:  CT 04/22/2018. FINDINGS: Right Kidney: Length: 9.9 cm. Mild renal cortical thinning without focal abnormality. There is moderate right-sided hydronephrosis and proximal hydroureter, probably mildly improved from the earlier CT. Left Kidney: Length: 9.9 cm. Mild renal cortical thinning without focal cortical abnormality. There is mild left-sided hydronephrosis and proximal hydroureter, probably mildly improved from the earlier CT. Bladder: Foley catheter has been placed since the CT with decompression of the bladder. IMPRESSION: 1. The bilateral hydronephrosis appears improved following Foley catheter placement. 2. Both kidneys demonstrate cortical thinning. Electronically Signed   By: Richardean Sale M.D.   On: 04/22/2018 19:25    (Echo, Carotid, EGD, Colonoscopy, ERCP)    Subjective:   Discharge Exam: Vitals:   04/25/18 2044 04/26/18 0417  BP: 120/75 121/75  Pulse: (!) 108 (!) 103  Resp: 18 18  Temp: 98.8 F (37.1 C) 98.6 F (37 C)  SpO2: 98% 96%   Vitals:   04/25/18 1300 04/25/18 1415 04/25/18 2044 04/26/18 0417  BP:  114/81 120/75 121/75  Pulse:  (!) 111 (!) 108 (!) 103  Resp:  18 18 18   Temp:  98.7 F (37.1 C) 98.8 F (37.1 C) 98.6 F (37 C)  TempSrc:  Oral Oral Oral  SpO2: 97%  98% 96%  Weight:      Height:        General: Pt is alert, awake, not in acute distress Cardiovascular: RRR, S1/S2 +, no rubs, no gallops Respiratory: CTA bilaterally, no wheezing, no rhonchi Abdominal: Soft, NT, ND, bowel sounds + Extremities: no edema, no cyanosis    The results of significant diagnostics from this hospitalization (including imaging, microbiology, ancillary and laboratory) are listed below for reference.     Microbiology: Recent Results (from the past 240 hour(s))  Urine culture     Status: Abnormal   Collection Time: 04/22/18  1:29 PM  Result Value Ref Range Status   Specimen Description   Final    URINE,  RANDOM Performed at [facility] [address] [place], [place] [address]    Special Requests   Final    NONE Performed at [facility], [place] [address], [place], [place] [address]    Culture 50,000 COLONIES/mL KLEBSIELLA PNEUMONIAE (A)  Final   Report Status 04/24/2018 FINAL  Final   Organism ID, Bacteria KLEBSIELLA PNEUMONIAE (A)  Final      Susceptibility   Klebsiella pneumoniae - MIC*    AMPICILLIN >=32 RESISTANT Resistant     CEFAZOLIN <=4 SENSITIVE Sensitive     CEFTRIAXONE <=1 SENSITIVE Sensitive     CIPROFLOXACIN <=0.25 SENSITIVE Sensitive     GENTAMICIN <=1 SENSITIVE Sensitive     IMIPENEM <=0.25 SENSITIVE Sensitive     NITROFURANTOIN 64 INTERMEDIATE Intermediate     TRIMETH/SULFA <=20 SENSITIVE Sensitive     AMPICILLIN/SULBACTAM 4 SENSITIVE Sensitive     PIP/TAZO <=4 SENSITIVE Sensitive     Extended ESBL NEGATIVE Sensitive     * 50,000 COLONIES/mL KLEBSIELLA PNEUMONIAE  Blood culture (routine x 2)  Status: None (Preliminary result)   Collection Time: 04/22/18  2:53 PM  Result Value Ref Range Status   Specimen Description   Final    BLOOD LEFT ARM Performed at [facility] [address], [place], [place] [address]    Special Requests   Final    BOTTLES DRAWN AEROBIC AND ANAEROBIC Blood Culture adequate volume Performed at [facility] [address] [place], [place] [address]    Culture   Final    NO GROWTH 3 DAYS Performed at [name], [place] [address] [place], [place] [address]    Report Status PENDING  Incomplete  Blood culture (routine x 2)     Status: Abnormal   Collection Time: 04/22/18  3:35 PM  Result Value Ref Range Status   Specimen Description   Final    BLOOD RIGHT HAND Performed at [facility] [address], [place], [place] [address]    Special Requests   Final    BOTTLES DRAWN AEROBIC AND ANAEROBIC Blood Culture results may not be  optimal due to an excessive volume of blood received in culture bottles Performed at [facility] [address] [place], [place] [address]    Culture  Setup Time   Final    ANAEROBIC BOTTLE ONLY GRAM POSITIVE COCCI CRITICAL RESULT CALLED TO, READ BACK BY AND VERIFIED WITH: LEANN POINDEXTER @ 6195 ON 04/23/18 BY ROBINSON Z.    Culture (A)  Final    STAPHYLOCOCCUS SPECIES (COAGULASE NEGATIVE) THE SIGNIFICANCE OF ISOLATING THIS ORGANISM FROM A SINGLE SET OF BLOOD CULTURES WHEN MULTIPLE SETS ARE DRAWN IS UNCERTAIN. PLEASE NOTIFY THE MICROBIOLOGY DEPARTMENT WITHIN ONE WEEK IF SPECIATION AND SENSITIVITIES ARE REQUIRED. Performed at [facility], [place] [address] [place], [place] [address]    Report Status 04/25/2018 FINAL  Final  Blood Culture ID Panel (Reflexed)     Status: Abnormal   Collection Time: 04/22/18  3:35 PM  Result Value Ref Range Status   Enterococcus species NOT DETECTED NOT DETECTED Final   Listeria monocytogenes NOT DETECTED NOT DETECTED Final   Staphylococcus species DETECTED (A) NOT DETECTED Final    Comment: Methicillin (oxacillin) resistant coagulase negative staphylococcus. Possible blood culture contaminant (unless isolated from more than one blood culture draw or clinical case suggests pathogenicity). No antibiotic treatment is indicated for blood  culture contaminants. CRITICAL RESULT CALLED TO, READ BACK BY AND VERIFIED WITH: LEANN POINDEXTER @ 7124 ON 04/23/18 BY ROBINSON Z.     Staphylococcus aureus NOT DETECTED NOT DETECTED Final   Methicillin resistance DETECTED (A) NOT DETECTED Final    Comment: CRITICAL RESULT CALLED TO, READ BACK BY AND VERIFIED WITH: LEANN POINDEXTER @ 5809 ON 04/23/18 BY ROBINSON Z.     Streptococcus species NOT DETECTED NOT DETECTED Final   Streptococcus agalactiae NOT DETECTED NOT DETECTED Final   Streptococcus pneumoniae NOT DETECTED NOT DETECTED Final   Streptococcus pyogenes NOT DETECTED NOT DETECTED Final    Acinetobacter baumannii NOT DETECTED NOT DETECTED Final   Enterobacteriaceae species NOT DETECTED NOT DETECTED Final   Enterobacter cloacae complex NOT DETECTED NOT DETECTED Final   Escherichia coli NOT DETECTED NOT DETECTED Final   Klebsiella oxytoca NOT DETECTED NOT DETECTED Final   Klebsiella pneumoniae NOT DETECTED NOT DETECTED Final   Proteus species NOT DETECTED NOT DETECTED Final   Serratia marcescens NOT DETECTED NOT DETECTED Final   Haemophilus influenzae NOT DETECTED NOT DETECTED Final   Neisseria meningitidis NOT DETECTED  NOT DETECTED Final   Pseudomonas aeruginosa NOT DETECTED NOT DETECTED Final   Candida albicans NOT DETECTED NOT DETECTED Final   Candida glabrata NOT DETECTED NOT DETECTED Final   Candida krusei NOT DETECTED NOT DETECTED Final   Candida parapsilosis NOT DETECTED NOT DETECTED Final   Candida tropicalis NOT DETECTED NOT DETECTED Final     Labs: BNP (last 3 results) No results for input(s): BNP in the last 8760 hours. Basic Metabolic Panel: Recent Labs  Lab 04/22/18 1451  04/22/18 1453 04/22/18 1502 04/23/18 0418 04/24/18 0413 04/25/18 0428 04/26/18 0542  NA  --    < > 136 133* 137 141 144 142  K  --    < > 5.8* 5.7* 5.6* 5.4* 4.9 4.5  CL  --    < > 106 108 107 110 111 113*  CO2  --   --  15*  --  21* 22 23 24   GLUCOSE  --    < > 92 90 152* 88 96 116*  BUN  --    < > 109* 100* 90* 76* 58* 39*  CREATININE  --    < > 8.15* 8.30* 5.92* 4.01* 2.60* 1.79*  CALCIUM  --   --  8.2*  --  8.3* 7.9* 8.3* 7.8*  MG 2.5*  --   --   --   --   --   --   --   PHOS 6.1*  --   --   --   --   --   --   --    < > = values in this interval not displayed.   Liver Function Tests: Recent Labs  Lab 04/22/18 1453 04/23/18 0418  AST 15 15  ALT 13 14  ALKPHOS 80 82  BILITOT 0.6 0.5  PROT 6.4* 6.5  ALBUMIN 2.6* 2.7*   No results for input(s): LIPASE, AMYLASE in the last 168 hours. No results for input(s): AMMONIA in the last 168 hours. CBC: Recent Labs  Lab  04/22/18 1453 04/22/18 1502 04/23/18 0418 04/26/18 0542  WBC 12.7*  --  9.9 12.0*  NEUTROABS 8.7*  --   --   --   HGB 10.2* 10.9* 10.8* 9.4*  HCT 32.6* 32.0* 34.1* 30.4*  MCV 87.2  --  86.8 87.9  PLT 455*  --  408* 371   Cardiac Enzymes: No results for input(s): CKTOTAL, CKMB, CKMBINDEX, TROPONINI in the last 168 hours. BNP: Invalid input(s): POCBNP CBG: Recent Labs  Lab 04/25/18 0922 04/25/18 1209 04/25/18 1741 04/25/18 2151 04/26/18 0810  GLUCAP 94 145* 116* 191* 93   D-Dimer No results for input(s): DDIMER in the last 72 hours. Hgb A1c No results for input(s): HGBA1C in the last 72 hours. Lipid Profile No results for input(s): CHOL, HDL, LDLCALC, TRIG, CHOLHDL, LDLDIRECT in the last 72 hours. Thyroid function studies No results for input(s): TSH, T4TOTAL, T3FREE, THYROIDAB in the last 72 hours.  Invalid input(s): FREET3 Anemia work up No results for input(s): VITAMINB12, FOLATE, FERRITIN, TIBC, IRON, RETICCTPCT in the last 72 hours. Urinalysis    Component Value Date/Time   COLORURINE YELLOW 04/22/2018 1329   APPEARANCEUR CLEAR 04/22/2018 1329   LABSPEC 1.013 04/22/2018 1329   PHURINE 5.0 04/22/2018 1329   GLUCOSEU NEGATIVE 04/22/2018 1329   HGBUR MODERATE (A) 04/22/2018 1329   BILIRUBINUR NEGATIVE 04/22/2018 1329   KETONESUR NEGATIVE 04/22/2018 1329   PROTEINUR 100 (A) 04/22/2018 1329   UROBILINOGEN 0.2 03/03/2010 0808   NITRITE NEGATIVE 04/22/2018 1329  LEUKOCYTESUR LARGE (A) 04/22/2018 1329   Sepsis Labs Invalid input(s): PROCALCITONIN,  WBC,  LACTICIDVEN Microbiology Recent Results (from the past 240 hour(s))  Urine culture     Status: Abnormal   Collection Time: 04/22/18  1:29 PM  Result Value Ref Range Status   Specimen Description   Final    URINE, RANDOM Performed at [facility] [address] [place], [place] [address]    Special Requests   Final    NONE Performed at [facility], [place] [address] [place], [place] [address]    Culture 50,000 COLONIES/mL KLEBSIELLA PNEUMONIAE (A)  Final   Report Status 04/24/2018 FINAL  Final   Organism ID, Bacteria KLEBSIELLA PNEUMONIAE (A)  Final      Susceptibility   Klebsiella pneumoniae - MIC*    AMPICILLIN >=32 RESISTANT Resistant     CEFAZOLIN <=4 SENSITIVE Sensitive     CEFTRIAXONE <=1 SENSITIVE Sensitive     CIPROFLOXACIN <=0.25 SENSITIVE Sensitive     GENTAMICIN <=1 SENSITIVE Sensitive     IMIPENEM <=0.25 SENSITIVE Sensitive     NITROFURANTOIN 64 INTERMEDIATE Intermediate     TRIMETH/SULFA <=20 SENSITIVE Sensitive     AMPICILLIN/SULBACTAM 4 SENSITIVE Sensitive     PIP/TAZO <=4 SENSITIVE Sensitive     Extended ESBL NEGATIVE Sensitive     * 50,000 COLONIES/mL KLEBSIELLA PNEUMONIAE  Blood culture (routine x 2)     Status: None (Preliminary result)   Collection Time: 04/22/18  2:53 PM  Result Value Ref Range Status   Specimen Description   Final    BLOOD LEFT ARM Performed at [facility] [address], [place], [place] [address]    Special Requests   Final    BOTTLES DRAWN AEROBIC AND ANAEROBIC Blood Culture adequate volume Performed at [facility] [address] [place], [place] [address]    Culture   Final    NO GROWTH 3 DAYS Performed at [facility], [place] [address] [place], [place] [address]    Report Status PENDING  Incomplete  Blood culture (routine x 2)     Status: Abnormal   Collection Time: 04/22/18  3:35 PM  Result Value Ref Range Status   Specimen Description   Final    BLOOD RIGHT HAND Performed at [facility] [address], [place], [place] [address]    Special Requests   Final    BOTTLES DRAWN AEROBIC AND ANAEROBIC Blood Culture results may not be optimal due to an excessive volume of blood received in culture bottles Performed at [facility] [address] [place], [place] [address]    Culture  Setup Time   Final     ANAEROBIC BOTTLE ONLY GRAM POSITIVE COCCI CRITICAL RESULT CALLED TO, READ BACK BY AND VERIFIED WITH: LEANN POINDEXTER @ 6834 ON 04/23/18 BY ROBINSON Z.    Culture (A)  Final    STAPHYLOCOCCUS SPECIES (COAGULASE NEGATIVE) THE SIGNIFICANCE OF ISOLATING THIS ORGANISM FROM A SINGLE SET OF BLOOD CULTURES WHEN MULTIPLE SETS ARE DRAWN IS UNCERTAIN. PLEASE NOTIFY THE MICROBIOLOGY DEPARTMENT WITHIN ONE WEEK IF SPECIATION AND SENSITIVITIES ARE REQUIRED. Performed at [facility], [place] [address] [place],  [address]    Report Status 04/25/2018 FINAL  Final  Blood Culture ID Panel (Reflexed)     Status: Abnormal   Collection Time: 04/22/18  3:35 PM  Result Value Ref Range Status   Enterococcus species NOT DETECTED NOT DETECTED Final  Listeria monocytogenes NOT DETECTED NOT DETECTED Final   Staphylococcus species DETECTED (A) NOT DETECTED Final    Comment: Methicillin (oxacillin) resistant coagulase negative staphylococcus. Possible blood culture contaminant (unless isolated from more than one blood culture draw or clinical case suggests pathogenicity). No antibiotic treatment is indicated for blood  culture contaminants. CRITICAL RESULT CALLED TO, READ BACK BY AND VERIFIED WITH: LEANN POINDEXTER @ [id number] ON 04/23/18 BY ROBINSON Z.     Staphylococcus aureus NOT DETECTED NOT DETECTED Final   Methicillin resistance DETECTED (A) NOT DETECTED Final    Comment: CRITICAL RESULT CALLED TO, READ BACK BY AND VERIFIED WITH: LEANN POINDEXTER @ 1771 ON 04/23/18 BY ROBINSON Z.     Streptococcus species NOT DETECTED NOT DETECTED Final   Streptococcus agalactiae NOT DETECTED NOT DETECTED Final   Streptococcus pneumoniae NOT DETECTED NOT DETECTED Final   Streptococcus pyogenes NOT DETECTED NOT DETECTED Final   Acinetobacter baumannii NOT DETECTED NOT DETECTED Final   Enterobacteriaceae species NOT DETECTED NOT DETECTED Final   Enterobacter cloacae complex NOT DETECTED NOT DETECTED Final   Escherichia  coli NOT DETECTED NOT DETECTED Final   Klebsiella oxytoca NOT DETECTED NOT DETECTED Final   Klebsiella pneumoniae NOT DETECTED NOT DETECTED Final   Proteus species NOT DETECTED NOT DETECTED Final   Serratia marcescens NOT DETECTED NOT DETECTED Final   Haemophilus influenzae NOT DETECTED NOT DETECTED Final   Neisseria meningitidis NOT DETECTED NOT DETECTED Final   Pseudomonas aeruginosa NOT DETECTED NOT DETECTED Final   Candida albicans NOT DETECTED NOT DETECTED Final   Candida glabrata NOT DETECTED NOT DETECTED Final   Candida krusei NOT DETECTED NOT DETECTED Final   Candida parapsilosis NOT DETECTED NOT DETECTED Final   Candida tropicalis NOT DETECTED NOT DETECTED Final     Time coordinating discharge: 36 minutes  SIGNED:   Georgette Shell, MD  [facility] 04/26/2018, 9:53 AM Pager   If 7PM-7AM, please contact night-coverage [email or web address] Password [email or web address]

## 2018-04-27 LAB — BASIC METABOLIC PANEL
Anion gap: 8 (ref 5–15)
BUN: 29 mg/dL — ABNORMAL HIGH (ref 8–23)
CHLORIDE: 114 mmol/L — AB (ref 98–111)
CO2: 21 mmol/L — AB (ref 22–32)
CREATININE: 1.44 mg/dL — AB (ref 0.44–1.00)
Calcium: 7.9 mg/dL — ABNORMAL LOW (ref 8.9–10.3)
GFR calc non Af Amer: 35 mL/min — ABNORMAL LOW (ref >60)
GFR, EST AFRICAN AMERICAN: 40 mL/min — AB (ref >60)
Glucose, Bld: 84 mg/dL (ref 70–99)
POTASSIUM: 4.8 mmol/L (ref 3.5–5.1)
SODIUM: 143 mmol/L (ref 135–145)

## 2018-04-27 LAB — CULTURE, BLOOD (ROUTINE X 2)
Culture: NO GROWTH
Special Requests: ADEQUATE

## 2018-04-27 LAB — GLUCOSE, CAPILLARY
GLUCOSE-CAPILLARY: 79 mg/dL (ref 70–99)
GLUCOSE-CAPILLARY: 95 mg/dL (ref 70–99)
Glucose-Capillary: 105 mg/dL — ABNORMAL HIGH (ref 70–99)
Glucose-Capillary: 90 mg/dL (ref 70–99)

## 2018-04-27 MED ORDER — CALCIUM CARBONATE ANTACID 500 MG PO CHEW
1.0000 | CHEWABLE_TABLET | Freq: Two times a day (BID) | ORAL | Status: DC
  Administered 2018-04-27: 200 mg via ORAL
  Filled 2018-04-27: qty 1

## 2018-04-27 MED ORDER — FAMOTIDINE 20 MG PO TABS
20.0000 mg | ORAL_TABLET | Freq: Every day | ORAL | Status: DC
  Administered 2018-04-27: 20 mg via ORAL
  Filled 2018-04-27: qty 1

## 2018-04-27 MED ORDER — HYDROCODONE-ACETAMINOPHEN 5-325 MG PO TABS: 1.0000 | ORAL_TABLET | Freq: Four times a day (QID) | ORAL | 0 refills | Status: DC | PRN

## 2018-04-27 MED ORDER — AMOXICILLIN-POT CLAVULANATE 875-125 MG PO TABS: 1.0000 | ORAL_TABLET | Freq: Two times a day (BID) | ORAL | 0 refills | Status: AC

## 2018-04-27 MED ORDER — MEGESTROL ACETATE 400 MG/10ML PO SUSP: 400.0000 mg | Freq: Every day | ORAL | 3 refills | Status: DC

## 2018-04-27 NOTE — Discharge Instructions (Signed)
1)Maintain Adequate hydration 2)Repeat BMP and CBC in a week 3)Please encourage patient to eat 4)Routine Foley catheter care 5) follow-up with [facility] as outpatient in 1 to 2 weeks

## 2018-04-27 NOTE — Clinical Social Work Placement (Signed)
Patient received and accepted bed offer at [facility] Facility aware of patient's discharge and confirmed bed offer [facility] authorization received). [facility] contacted, patient's family notified. Patient's RN can call report to [place], packet complete. CSW signing off, no other needs identified at this time.  CLINICAL SOCIAL WORK PLACEMENT  NOTE  Date:  04/27/2018  Patient Details  Name: Stephanie Olson MRN: [id number] Date of Birth: August 26, 1941  Clinical Social Work is seeking post-discharge placement for this patient at the [place] level of care (*CSW will initial, date and re-position this form in  chart as items are completed):  Yes   Patient/family provided with [place] [facility] Work Department's list of facilities offering this level of care within the geographic area requested by the patient (or if unable, by the patient's family).  Yes   Patient/family informed of their freedom to choose among providers that offer the needed level of care, that participate in Medicare, Medicaid or managed care program needed by the patient, have an available bed and are willing to accept the patient.  Yes   Patient/family informed of [facility]'s ownership interest in [facility] and [facility], as well as of the fact that they are under no obligation to receive care at these facilities.  PASRR submitted to EDS on       PASRR number received on       Existing PASRR number confirmed on 04/26/18     FL2 transmitted to all facilities in geographic area requested by pt/family on 04/25/18     FL2 transmitted to all facilities within larger geographic area on       Patient informed that his/her managed care company has contracts with or will negotiate with certain facilities, including the following:        Yes   Patient/family informed of bed offers received.  Patient chooses bed at [facility]     Physician recommends  and patient chooses bed at      Patient to be transferred to [facility] on 04/27/18.  Patient to be transferred to facility by [facility]     Patient family notified on 04/27/18 of transfer.  Name of family member notified:  Glennis Brink      PHYSICIAN       Additional Comment:    _______________________________________________ Burnis Medin, LCSW 04/27/2018, 3:02 PM

## 2018-04-27 NOTE — Progress Notes (Addendum)
Discharge report called to nurse [place] at [facility]. Facility to schedule follow-up appt with [facility]. Awaiting [facility] for transportation. Eulas Post, RN

## 2018-04-27 NOTE — Progress Notes (Signed)
[facility] here to transport patient to [place] [facility]. Pts son [name] called and made aware with patients permission. All 3 belonging bags sent with patient.

## 2018-04-27 NOTE — Discharge Summary (Signed)
Stephanie Olson, is a 77 y.o. female  DOB 05-24-1941  MRN [id number].  Admission date:  04/22/2018  Admitting Physician  Kerney Elbe, DO  Discharge Date:  04/27/2018   Primary MD  Benito Mccreedy, MD  Recommendations for primary care physician for things to follow:   1)Maintain Adequate hydration 2)Repeat BMP and CBC in a week 3)Please encourage patient to eat 4)Routine Foley catheter care 5) [facility] in 1 to 2 weeks  Admission Diagnosis  Hyperkalemia [E87.5] Urinary retention [R33.9] Pneumonia [J18.9] AKI (acute kidney injury) ([place]) [N17.9] Acute renal failure, unspecified acute renal failure type ([place]) [N17.9]   Discharge Diagnosis  Hyperkalemia [E87.5] Urinary retention [R33.9] Pneumonia [J18.9] AKI (acute kidney injury) ([place]) [N17.9] Acute renal failure, unspecified acute renal failure type ([place]) [N17.9]    Active Problems:   PVD (peripheral vascular disease) ([place])   Diabetic neuropathy ([place])   Renal failure (ARF), acute on chronic (HCC)   Essential hypertension   Normocytic anemia   Metabolic acidosis, normal anion gap (NAG)   Hyperkalemia   Aspiration into airway   Generalized weakness   Lethargy   Poor appetite   Failure to thrive in adult   Pressure injury of skin   Acute renal failure ([place])   Urinary retention   AKI (acute kidney injury) ([place])   Malnutrition of moderate degree      Past Medical History:  Diagnosis Date  . Arthritis   . Diabetes mellitus without complication ([place])   . Diabetic foot ulcers (HCC)    RIGHT   . Hypertension   . Peripheral vascular disease [facility])     Past Surgical History:  Procedure Laterality Date  . ABDOMINAL AORTAGRAM  01/29/2015  . ATHERECTOMY Right 01/29/2015   FEMORAL ARTERY   . BALLOON ANGIOPLASTY, ARTERY Right 01/29/2015   RT FEMORAL   . FOOT SURGERY    . PERIPHERAL VASCULAR  CATHETERIZATION N/A 01/29/2015   Procedure: Abdominal Aortogram w/Lower Extremity;  Surgeon: Serafina Mitchell, MD;  Location: [facility];  Service: Cardiovascular;  Laterality: N/A;      HPI  from the history and physical done on the day of admission:    Patient coming from: [facility]  Chief Complaint: Poor Appetite, Generalized Weakness and Fatigue  HPI: CLEMENCE Olson is a 77 y.o. female with medical history significant of HTN, DM, PVD, Normocytic Anemia and other comorbids who presents with along with generalized weakness and fatigue.  Patient has been feeling more nauseous and vomiting and refusing to eat for the last week and states that since she fell last Friday she has not been well.  Denies any fevers or chills.  Her main complaint is her very poor appetite.  Denies chest pain, lightheadedness or dizziness.  Did admit to having some lower abdominal pain.  Also stated that she is has extreme difficulty urinating and states that she has not been urinating as much.  No other concerns or complaints at this time [facility] was called to  admit this patient for generalized weakness and fatigue along with acute kidney injury on chronic kidney disease.  ED Course: The ED basic blood work was done patient was given 1 L normal saline bolus along with IV Zofran and started on IV vancomycin IV cefepime for suspected aspiration pneumonia.   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 due to obstructive uropathy, relieved by placing Foley catheter    creatinine on admission=8.1  ,   baseline creatinine = 1.5 to 1.7   , creatinine is now= 1.44      ,  Avoid nephrotoxic agents/dehydration/hypotension, renal ultrasound showed bilateral hydronephrosis which resolved after placement of Foley catheter, CT scan done prior to the ultrasound showed markedly distended urinary bladder suggesting possible neurogenic bladder or bladder outlet obstruction.[facility],  2)Possible Aspiration Pneumonia--- was initially treated with IV Zosyn, white count is down to 13 from 16.9, okay to discharge on Augmentin to complete treatment  3)DM2--- last A1c 6.8, appetite is poor, avoid over aggressive control  4)Acute normocytic Anemia--evidence of bleeding suspect hemodilution, hemoglobin is down to 9.5 from 11.2, recheck CBC within a week  5)PAD-patient was not on statin and also not on antiplatelet agent prior to admission, dropping H/H noted, hold off on antiplatelet agents at this time, PCP at skilled nursing facility to determine if antiplatelet agent and/or statin drugs should be started  6)HTN-stable off amlodipine 5 mg daily, Amlodipine is  on hold due to soft blood pressures  7)Depression--- c/n  Zoloft and Remeron, affect is somewhat flat  8)Anorexia--- poor appetite, poor oral intake, use Megace for appetite stimulation, Remeron should also help with his  Discharge Condition: stable  Follow UP   Contact information for follow-up providers    Benito Mccreedy, MD Follow up.   Specialty:  Internal Medicine Contact information: [address] [address] [phone]        [facility] Follow up.   Contact information: [place] [phone]           Contact information for after-discharge care    Destination    [facility] .   Service:  Skilled Nursing Contact information: [place] [phone]                  Diet and Activity recommendation:  As advised  Discharge Instructions     Discharge Instructions    Call MD for:  difficulty breathing, headache or visual disturbances   Complete by:  As directed    Call MD for:  difficulty breathing, headache or visual disturbances   Complete by:  As directed    Call MD for:  persistant dizziness or  light-headedness   Complete by:  As directed    Call MD for:  persistant dizziness or light-headedness   Complete by:  As directed    Call MD for:  persistant nausea and vomiting   Complete by:  As directed    Call MD for:  persistant nausea and vomiting   Complete by:  As directed    Call MD for:  severe uncontrolled pain   Complete by:  As directed    Call MD for:  severe uncontrolled pain   Complete by:  As directed    Call MD for:  temperature >100.4   Complete by:  As directed  Call MD for:  temperature >100.4   Complete by:  As directed    Diet - low sodium heart healthy   Complete by:  As directed    Diet general   Complete by:  As directed    Discharge instructions   Complete by:  As directed    1)Maintain Adequate hydration 2)Repeat BMP and CBC in a week 3)Please encourage patient to eat 4)Routine Foley catheter care 5) [facility] in 1 to 2 weeks   Increase activity slowly   Complete by:  As directed    Increase activity slowly   Complete by:  As directed    Out of bed with assistance, fall precautions        Discharge Medications     Allergies as of 04/27/2018   No Known Allergies     Medication List    STOP taking these medications   amLODipine 5 MG tablet Commonly known as:  NORVASC   clopidogrel 75 MG tablet Commonly known as:  PLAVIX   cyclobenzaprine 5 MG tablet Commonly known as:  FLEXERIL   promethazine 25 MG tablet Commonly known as:  PHENERGAN     TAKE these medications   acetaminophen 500 MG tablet Commonly known as:  TYLENOL Take 500 mg by mouth every 6 (six) hours as needed.   amoxicillin-clavulanate 875-125 MG tablet Commonly known as:  AUGMENTIN Take 1 tablet by mouth 2 (two) times daily for 5 days.   BISACODYL PO Take 10 mg by mouth as needed (constipation). What changed:  Another medication with the same name was added. Make sure you understand how and when to take each.   bisacodyl  10 MG suppository Commonly known as:  DULCOLAX Place 1 suppository (10 mg total) rectally daily as needed for moderate constipation. What changed:  You were already taking a medication with the same name, and this prescription was added. Make sure you understand how and when to take each.   ENSURE PLUS Liqd Take 237 mLs by mouth 2 (two) times daily.   HYDROcodone-acetaminophen 5-325 MG tablet Commonly known as:  NORCO/VICODIN Take 1 tablet by mouth every 6 (six) hours as needed for moderate pain.   loperamide 2 MG capsule Commonly known as:  IMODIUM Take 1 capsule (2 mg total) by mouth every 6 (six) hours as needed for diarrhea or loose stools.   megestrol 400 MG/10ML suspension Commonly known as:  MEGACE Take 10 mLs (400 mg total) by mouth daily.   Melatonin 3 MG Tabs Take 3 mg by mouth at bedtime.   mirtazapine 7.5 MG tablet Commonly known as:  REMERON Take 7.5 mg by mouth at bedtime.   ondansetron 4 MG tablet Commonly known as:  ZOFRAN Take 1 tablet (4 mg total) by mouth every 6 (six) hours as needed for nausea.   sertraline 50 MG tablet Commonly known as:  ZOLOFT Take 75 mg by mouth daily.       Major procedures and Radiology Reports - PLEASE review detailed and final reports for all details, in brief -      Ct Abdomen Pelvis Wo Contrast  Result Date: 04/22/2018 CLINICAL DATA:  Weakness, lethargy and decreased appetite. Nausea. Remote abdominal surgery, unspecified. EXAM: CT ABDOMEN AND PELVIS WITHOUT CONTRAST TECHNIQUE: Multidetector CT imaging of the abdomen and pelvis was performed following the standard protocol without IV contrast. COMPARISON:  CT 03/03/2010. FINDINGS: Lower chest: There are streaky left of the right lower lobe airspace opacities which could reflect  atelectasis, although are suspicious for possible aspiration. There is a small left pleural effusion. No significant pericardial fluid. There is aortic and coronary artery atherosclerosis.  Hepatobiliary: No focal hepatic abnormality is identified on noncontrast imaging. There is no significant biliary dilatation. The gallbladder is significantly distended, similar to the previous study. There is no evidence gallbladder wall thickening or surrounding inflammation. Multiple dependent small gallstones are present in the gallbladder lumen. Pancreas: Atrophied without focal abnormality, ductal dilatation or surrounding inflammation. Spleen: Normal in size. Small calcified granuloma. No other focal abnormality. Adrenals/Urinary Tract: Stable mild thickening of both adrenal glands without focal nodule. Both kidneys demonstrate mild cortical thinning and at least moderate hydronephrosis and hydroureter, worse on the right. There is mild asymmetric perinephric soft tissue stranding on the right. No ureteral calculus or focal mass lesion identified. The urinary bladder is markedly distended without apparent focal abnormality. Stomach/Bowel: No evidence of bowel wall thickening, distention or surrounding inflammatory change. Mild proximal colonic diverticulosis. The appendix appears normal. Vascular/Lymphatic: There are no enlarged abdominal or pelvic lymph nodes. Aortic and branch vessel atherosclerosis. Reproductive: The uterus and ovaries appear normal. No adnexal mass. Other: No evidence of abdominal wall mass or hernia. No ascites. Musculoskeletal: No acute or significant osseous findings. T11 Schmorl's node and lower lumbar spondylosis noted. IMPRESSION: 1. Markedly distended urinary bladder, suggesting possible neurogenic bladder versus bladder outlet obstruction. Associated bilateral hydronephrosis and hydroureter, likely secondary to the bladder distention. Consider Foley catheter placement. Follow-up renal ultrasound recommended after voiding or bladder catheterization to document decompression of the bilateral collecting systems. No urinary tract calculi seen. 2. Gallbladder hydrops with dependent  stones, similar to previous studies. No gallbladder wall thickening or pericholecystic inflammation identified. 3. Patchy left-greater-than-right basilar airspace opacities suspicious for aspiration. Chest radiographs are pending. 4. Coronary and Aortic Atherosclerosis (ICD10-I70.0). Electronically Signed   By: Richardean Sale M.D.   On: 04/22/2018 14:28   Dg Chest 2 View  Result Date: 04/23/2018 CLINICAL DATA:  Pneumonia EXAM: CHEST - 2 VIEW COMPARISON:  04/22/2018 FINDINGS: Similar mild bandlike opacity in the left lower lobe over the diaphragm more compatible with atelectasis. Right lung remains clear. No significant airspace opacity, collapse or consolidation. No enlarging effusion or pneumothorax. Trachea is midline. Normal heart size and vascularity. Degenerative changes of the shoulders. IMPRESSION: Left basilar atelectasis. Electronically Signed   By: Jerilynn Mages.  Shick M.D.   On: 04/23/2018 10:25   Dg Chest 2 View  Result Date: 04/22/2018 CLINICAL DATA:  Lethargy and nausea EXAM: CHEST - 2 VIEW COMPARISON:  April 11, 2018 FINDINGS: There is atelectatic change in the left base. The lungs elsewhere are clear. Heart size and pulmonary vascularity are normal. No adenopathy. No bone lesions. IMPRESSION: Left base atelectasis. Earliest changes of pneumonia in this area cannot be excluded. Lungs elsewhere clear. Heart size normal. No evident adenopathy. Electronically Signed   By: Lowella Grip III M.D.   On: 04/22/2018 14:29   Dg Chest 2 View  Result Date: 04/11/2018 CLINICAL DATA:  Weakness. EXAM: CHEST - 2 VIEW COMPARISON:  None. FINDINGS: The heart size and mediastinal contours are within normal limits. Both lungs are clear except for a tiny area of atelectasis at the left base laterally. No effusions. No acute bone abnormality. 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 IMPRESSION: Minimal atelectasis at the left lung base. Electronically Signed    By: Lorriane Shire M.D.   On: 04/11/2018 17:01   Ct Head Wo Contrast  Result Date: 04/11/2018 CLINICAL DATA:  Fall with head trauma EXAM: CT HEAD WITHOUT CONTRAST TECHNIQUE: Contiguous axial images were obtained from the base of the skull through the vertex without intravenous contrast. COMPARISON:  None. FINDINGS: Brain: There is no mass, hemorrhage or extra-axial collection. The size and configuration of the ventricles and extra-axial CSF spaces are normal. There is an old left basal ganglia lacunar infarct. There is hypoattenuation of the periventricular white matter, most commonly indicating chronic ischemic microangiopathy. Vascular: No abnormal hyperdensity of the major intracranial arteries or dural venous sinuses. No intracranial atherosclerosis. Skull: The visualized skull base, calvarium and extracranial soft tissues are normal. Sinuses/Orbits: No fluid levels or advanced mucosal thickening of the visualized paranasal sinuses. No mastoid or middle ear effusion. The orbits are normal. IMPRESSION: 1. No acute intracranial abnormality. 2. 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 Electronically Signed   By: Ulyses Jarred M.D.   On: 04/11/2018 17:18   US Renal  Result Date: 04/22/2018 CLINICAL DATA:  Acute kidney injury. EXAM: RENAL / URINARY TRACT ULTRASOUND COMPLETE COMPARISON:  CT 04/22/2018. FINDINGS: Right Kidney: Length: 9.9 cm. Mild renal cortical thinning without focal abnormality. There is moderate right-sided hydronephrosis and proximal hydroureter, probably mildly improved from the earlier CT. Left Kidney: Length: 9.9 cm. Mild renal cortical thinning without focal cortical abnormality. There is mild left-sided hydronephrosis and proximal hydroureter, probably mildly improved from the earlier CT. Bladder: Foley catheter has been placed since the CT with decompression of the bladder. IMPRESSION: 1. The bilateral hydronephrosis appears improved  following Foley catheter placement. 2. Both kidneys demonstrate cortical thinning. Electronically Signed   By: Richardean Sale M.D.   On: 04/22/2018 19:25    Micro Results     Recent Results (from the past 240 hour(s))  Urine culture     Status: Abnormal   Collection Time: 04/22/18  1:29 PM  Result Value Ref Range Status   Specimen Description   Final    URINE, RANDOM Performed at [facility] [address], [place], [place] [address]    Special Requests   Final    NONE Performed at [facility], [place] [address], [place], [place] [address]    Culture 50,000 COLONIES/mL KLEBSIELLA PNEUMONIAE (A)  Final   Report Status 04/24/2018 FINAL  Final   Organism ID, Bacteria KLEBSIELLA PNEUMONIAE (A)  Final      Susceptibility   Klebsiella pneumoniae - MIC*    AMPICILLIN >=32 RESISTANT Resistant     CEFAZOLIN <=4 SENSITIVE Sensitive     CEFTRIAXONE <=1 SENSITIVE Sensitive     CIPROFLOXACIN <=0.25 SENSITIVE Sensitive     GENTAMICIN <=1 SENSITIVE Sensitive     IMIPENEM <=0.25 SENSITIVE Sensitive     NITROFURANTOIN 64 INTERMEDIATE Intermediate     TRIMETH/SULFA <=20 SENSITIVE Sensitive     AMPICILLIN/SULBACTAM 4 SENSITIVE Sensitive     PIP/TAZO <=4 SENSITIVE Sensitive     Extended ESBL NEGATIVE Sensitive     * 50,000 COLONIES/mL KLEBSIELLA PNEUMONIAE  Blood culture (routine x 2)     Status: None   Collection Time: 04/22/18  2:53 PM  Result Value Ref Range Status   Specimen Description   Final    BLOOD LEFT ARM Performed at [facility] [address], [place], [place] [address]    Special Requests   Final    BOTTLES DRAWN AEROBIC AND ANAEROBIC Blood Culture adequate volume Performed at [facility]  [address], [place], [place] [address]    Culture   Final    NO GROWTH 5 DAYS Performed at [facility], [place] [address] [place], [place] [address]    Report Status 04/27/2018 FINAL  Final  Blood  culture (routine x 2)     Status: Abnormal   Collection Time: 04/22/18  3:35 PM  Result Value Ref Range Status   Specimen Description   Final    BLOOD RIGHT HAND Performed at [facility] [address] [place], [place] [address]    Special Requests   Final    BOTTLES DRAWN AEROBIC AND ANAEROBIC Blood Culture results may not be optimal due to an excessive volume of blood received in culture bottles Performed at [facility] [address] [place], [place] [address]    Culture  Setup Time   Final    ANAEROBIC BOTTLE ONLY GRAM POSITIVE COCCI CRITICAL RESULT CALLED TO, READ BACK BY AND VERIFIED WITH: LEANN POINDEXTER @ 4481 ON 04/23/18 BY ROBINSON Z.    Culture (A)  Final    STAPHYLOCOCCUS SPECIES (COAGULASE NEGATIVE) THE SIGNIFICANCE OF ISOLATING THIS ORGANISM FROM A SINGLE SET OF BLOOD CULTURES WHEN MULTIPLE SETS ARE DRAWN IS UNCERTAIN. PLEASE NOTIFY THE MICROBIOLOGY DEPARTMENT WITHIN ONE WEEK IF SPECIATION AND SENSITIVITIES ARE REQUIRED. Performed at [facility], [place] [address], [place], [place] [address]    Report Status 04/25/2018 FINAL  Final  Blood Culture ID Panel (Reflexed)     Status: Abnormal   Collection Time: 04/22/18  3:35 PM  Result Value Ref Range Status   Enterococcus species NOT DETECTED NOT DETECTED Final   Listeria monocytogenes NOT DETECTED NOT DETECTED Final   Staphylococcus species DETECTED (A) NOT DETECTED Final    Comment: Methicillin (oxacillin) resistant coagulase negative staphylococcus. Possible blood culture contaminant (unless isolated from more than one blood culture draw or clinical case suggests pathogenicity). No antibiotic treatment is indicated for blood  culture contaminants. CRITICAL RESULT CALLED TO, READ BACK BY AND VERIFIED WITH: LEANN POINDEXTER @ 4970 ON 04/23/18 BY ROBINSON Z.     Staphylococcus aureus NOT DETECTED NOT DETECTED Final   Methicillin resistance DETECTED (A) NOT DETECTED Final     Comment: CRITICAL RESULT CALLED TO, READ BACK BY AND VERIFIED WITH: LEANN POINDEXTER @ 2637 ON 04/23/18 BY ROBINSON Z.     Streptococcus species NOT DETECTED NOT DETECTED Final   Streptococcus agalactiae NOT DETECTED NOT DETECTED Final   Streptococcus pneumoniae NOT DETECTED NOT DETECTED Final   Streptococcus pyogenes NOT DETECTED NOT DETECTED Final   Acinetobacter baumannii NOT DETECTED NOT DETECTED Final   Enterobacteriaceae species NOT DETECTED NOT DETECTED Final   Enterobacter cloacae complex NOT DETECTED NOT DETECTED Final   Escherichia coli NOT DETECTED NOT DETECTED Final   Klebsiella oxytoca NOT DETECTED NOT DETECTED Final   Klebsiella pneumoniae NOT DETECTED NOT DETECTED Final   Proteus species NOT DETECTED NOT DETECTED Final   Serratia marcescens NOT DETECTED NOT DETECTED Final   Haemophilus influenzae NOT DETECTED NOT DETECTED Final   Neisseria meningitidis NOT DETECTED NOT DETECTED Final   Pseudomonas aeruginosa NOT DETECTED NOT DETECTED Final   Candida albicans NOT DETECTED NOT DETECTED Final   Candida glabrata NOT DETECTED NOT DETECTED Final   Candida krusei NOT DETECTED NOT DETECTED Final   Candida parapsilosis NOT DETECTED NOT DETECTED Final   Candida tropicalis NOT DETECTED NOT DETECTED Final       Today   Subjective    Theora  Streater today has no new concerns, not very cooperative today, patient just wants to be left alone          Patient has been seen and examined prior to discharge   Objective   Blood pressure 119/78, pulse 93, temperature 98 F (36.7 C), temperature source Oral, resp. rate 18, height 5\' 9"  (1.753 m), weight 62.3 kg, SpO2 96 %.   Intake/Output Summary (Last 24 hours) at 04/27/2018 1436 Last data filed at 04/27/2018 1212 Gross per 24 hour  Intake 1841.28 ml  Output 1650 ml  Net 191.28 ml    Exam Gen:- Awake Alert,  In no apparent distress  HEENT:- [place].AT, No sclera icterus Neck-Supple Neck,No JVD,.  Lungs-  improving air movement,  no wheezing CV- S1, S2 normal Abd-  +ve B.Sounds, Abd Soft, No tenderness,    Extremity/Skin:- No  edema,   warm and dry  psych-anot very cooperative today, patient just wants to be left alone        Neuro-generalized weakness without  new focal deficits, no tremors   Data Review   CBC w Diff:  Lab Results  Component Value Date   WBC 12.0 (H) 04/26/2018   HGB 9.4 (L) 04/26/2018   HCT 30.4 (L) 04/26/2018   PLT 371 04/26/2018   LYMPHOPCT 22 04/22/2018   MONOPCT 9 04/22/2018   EOSPCT 0 04/22/2018   BASOPCT 0 04/22/2018    CMP:  Lab Results  Component Value Date   NA 143 04/27/2018   K 4.8 04/27/2018   CL 114 (H) 04/27/2018   CO2 21 (L) 04/27/2018   BUN 29 (H) 04/27/2018   CREATININE 1.44 (H) 04/27/2018   PROT 6.5 04/23/2018   ALBUMIN 2.7 (L) 04/23/2018   BILITOT 0.5 04/23/2018   ALKPHOS 82 04/23/2018   AST 15 04/23/2018   ALT 14 04/23/2018  .   Total Discharge time is about 33 minutes  Roxan Hockey M.D on 04/27/2018 at 2:36 PM   Go to [email or web address] - password [email or web address] for contact info  [facility] - Office  [phone]

## 2018-04-28 DIAGNOSIS — K5900 Constipation, unspecified: Secondary | ICD-10-CM | POA: Diagnosis not present

## 2018-04-28 DIAGNOSIS — L603 Nail dystrophy: Secondary | ICD-10-CM | POA: Diagnosis not present

## 2018-04-28 DIAGNOSIS — R2681 Unsteadiness on feet: Secondary | ICD-10-CM | POA: Diagnosis not present

## 2018-04-28 DIAGNOSIS — L97413 Non-pressure chronic ulcer of right heel and midfoot with necrosis of muscle: Secondary | ICD-10-CM | POA: Diagnosis not present

## 2018-04-28 DIAGNOSIS — R112 Nausea with vomiting, unspecified: Secondary | ICD-10-CM | POA: Diagnosis not present

## 2018-04-28 DIAGNOSIS — R627 Adult failure to thrive: Secondary | ICD-10-CM | POA: Diagnosis not present

## 2018-04-28 DIAGNOSIS — R338 Other retention of urine: Secondary | ICD-10-CM | POA: Diagnosis not present

## 2018-04-28 DIAGNOSIS — R293 Abnormal posture: Secondary | ICD-10-CM | POA: Diagnosis not present

## 2018-04-28 DIAGNOSIS — T83091A Other mechanical complication of indwelling urethral catheter, initial encounter: Secondary | ICD-10-CM | POA: Diagnosis not present

## 2018-04-28 DIAGNOSIS — L89153 Pressure ulcer of sacral region, stage 3: Secondary | ICD-10-CM | POA: Diagnosis not present

## 2018-04-28 DIAGNOSIS — M62838 Other muscle spasm: Secondary | ICD-10-CM | POA: Diagnosis not present

## 2018-04-28 DIAGNOSIS — L22 Diaper dermatitis: Secondary | ICD-10-CM | POA: Diagnosis not present

## 2018-04-28 DIAGNOSIS — B351 Tinea unguium: Secondary | ICD-10-CM | POA: Diagnosis not present

## 2018-04-28 DIAGNOSIS — I739 Peripheral vascular disease, unspecified: Secondary | ICD-10-CM | POA: Diagnosis not present

## 2018-04-28 DIAGNOSIS — M79671 Pain in right foot: Secondary | ICD-10-CM | POA: Diagnosis not present

## 2018-04-28 DIAGNOSIS — N189 Chronic kidney disease, unspecified: Secondary | ICD-10-CM | POA: Diagnosis not present

## 2018-04-28 DIAGNOSIS — D649 Anemia, unspecified: Secondary | ICD-10-CM | POA: Diagnosis not present

## 2018-04-28 DIAGNOSIS — N1330 Unspecified hydronephrosis: Secondary | ICD-10-CM | POA: Diagnosis not present

## 2018-04-28 DIAGNOSIS — G8929 Other chronic pain: Secondary | ICD-10-CM | POA: Diagnosis not present

## 2018-04-28 DIAGNOSIS — R109 Unspecified abdominal pain: Secondary | ICD-10-CM | POA: Diagnosis not present

## 2018-04-28 DIAGNOSIS — G894 Chronic pain syndrome: Secondary | ICD-10-CM | POA: Diagnosis not present

## 2018-04-28 DIAGNOSIS — R339 Retention of urine, unspecified: Secondary | ICD-10-CM | POA: Diagnosis not present

## 2018-04-28 DIAGNOSIS — I10 Essential (primary) hypertension: Secondary | ICD-10-CM | POA: Diagnosis not present

## 2018-04-28 DIAGNOSIS — L97419 Non-pressure chronic ulcer of right heel and midfoot with unspecified severity: Secondary | ICD-10-CM | POA: Diagnosis not present

## 2018-04-28 DIAGNOSIS — R1312 Dysphagia, oropharyngeal phase: Secondary | ICD-10-CM | POA: Diagnosis not present

## 2018-04-28 DIAGNOSIS — N179 Acute kidney failure, unspecified: Secondary | ICD-10-CM | POA: Diagnosis not present

## 2018-04-28 DIAGNOSIS — M6281 Muscle weakness (generalized): Secondary | ICD-10-CM | POA: Diagnosis not present

## 2018-04-28 DIAGNOSIS — I959 Hypotension, unspecified: Secondary | ICD-10-CM | POA: Diagnosis not present

## 2018-04-28 DIAGNOSIS — E119 Type 2 diabetes mellitus without complications: Secondary | ICD-10-CM | POA: Diagnosis not present

## 2018-04-29 DIAGNOSIS — I10 Essential (primary) hypertension: Secondary | ICD-10-CM | POA: Diagnosis not present

## 2018-04-29 DIAGNOSIS — R339 Retention of urine, unspecified: Secondary | ICD-10-CM | POA: Diagnosis not present

## 2018-04-29 DIAGNOSIS — N179 Acute kidney failure, unspecified: Secondary | ICD-10-CM | POA: Diagnosis not present

## 2018-05-03 DIAGNOSIS — R339 Retention of urine, unspecified: Secondary | ICD-10-CM | POA: Diagnosis not present

## 2018-05-03 DIAGNOSIS — T83091A Other mechanical complication of indwelling urethral catheter, initial encounter: Secondary | ICD-10-CM | POA: Diagnosis not present

## 2018-05-03 DIAGNOSIS — L89153 Pressure ulcer of sacral region, stage 3: Secondary | ICD-10-CM | POA: Diagnosis not present

## 2018-05-03 DIAGNOSIS — L22 Diaper dermatitis: Secondary | ICD-10-CM | POA: Diagnosis not present

## 2018-05-13 DIAGNOSIS — K5900 Constipation, unspecified: Secondary | ICD-10-CM | POA: Diagnosis not present

## 2018-05-16 DIAGNOSIS — R339 Retention of urine, unspecified: Secondary | ICD-10-CM | POA: Diagnosis not present

## 2018-05-18 DIAGNOSIS — L97419 Non-pressure chronic ulcer of right heel and midfoot with unspecified severity: Secondary | ICD-10-CM | POA: Diagnosis not present

## 2018-05-24 DIAGNOSIS — K5900 Constipation, unspecified: Secondary | ICD-10-CM | POA: Diagnosis not present

## 2018-05-24 DIAGNOSIS — N179 Acute kidney failure, unspecified: Secondary | ICD-10-CM | POA: Diagnosis not present

## 2018-05-24 DIAGNOSIS — R339 Retention of urine, unspecified: Secondary | ICD-10-CM | POA: Diagnosis not present

## 2018-05-27 DIAGNOSIS — G894 Chronic pain syndrome: Secondary | ICD-10-CM | POA: Diagnosis not present

## 2018-05-27 DIAGNOSIS — M79671 Pain in right foot: Secondary | ICD-10-CM | POA: Diagnosis not present

## 2018-06-02 DIAGNOSIS — R338 Other retention of urine: Secondary | ICD-10-CM | POA: Diagnosis not present

## 2018-06-02 DIAGNOSIS — N1330 Unspecified hydronephrosis: Secondary | ICD-10-CM | POA: Diagnosis not present

## 2018-06-03 DIAGNOSIS — I959 Hypotension, unspecified: Secondary | ICD-10-CM | POA: Diagnosis not present

## 2018-06-03 DIAGNOSIS — E119 Type 2 diabetes mellitus without complications: Secondary | ICD-10-CM | POA: Diagnosis not present

## 2018-06-03 DIAGNOSIS — R339 Retention of urine, unspecified: Secondary | ICD-10-CM | POA: Diagnosis not present

## 2018-06-03 DIAGNOSIS — K5900 Constipation, unspecified: Secondary | ICD-10-CM | POA: Diagnosis not present

## 2018-06-03 DIAGNOSIS — D649 Anemia, unspecified: Secondary | ICD-10-CM | POA: Diagnosis not present

## 2018-06-03 DIAGNOSIS — G8929 Other chronic pain: Secondary | ICD-10-CM | POA: Diagnosis not present

## 2018-06-03 DIAGNOSIS — M62838 Other muscle spasm: Secondary | ICD-10-CM | POA: Diagnosis not present

## 2018-06-03 DIAGNOSIS — R112 Nausea with vomiting, unspecified: Secondary | ICD-10-CM | POA: Diagnosis not present

## 2018-06-06 DIAGNOSIS — D649 Anemia, unspecified: Secondary | ICD-10-CM | POA: Diagnosis not present

## 2018-06-06 DIAGNOSIS — N179 Acute kidney failure, unspecified: Secondary | ICD-10-CM | POA: Diagnosis not present

## 2018-06-06 DIAGNOSIS — E860 Dehydration: Secondary | ICD-10-CM | POA: Diagnosis not present

## 2018-06-06 DIAGNOSIS — L97413 Non-pressure chronic ulcer of right heel and midfoot with necrosis of muscle: Secondary | ICD-10-CM | POA: Diagnosis not present

## 2018-06-06 DIAGNOSIS — N189 Chronic kidney disease, unspecified: Secondary | ICD-10-CM | POA: Diagnosis not present

## 2018-06-06 DIAGNOSIS — N1330 Unspecified hydronephrosis: Secondary | ICD-10-CM | POA: Diagnosis not present

## 2018-06-07 DIAGNOSIS — N179 Acute kidney failure, unspecified: Secondary | ICD-10-CM | POA: Diagnosis not present

## 2018-06-07 DIAGNOSIS — N189 Chronic kidney disease, unspecified: Secondary | ICD-10-CM | POA: Diagnosis not present

## 2018-06-07 DIAGNOSIS — E860 Dehydration: Secondary | ICD-10-CM | POA: Diagnosis not present

## 2018-06-08 DIAGNOSIS — E860 Dehydration: Secondary | ICD-10-CM | POA: Diagnosis not present

## 2018-06-10 DIAGNOSIS — D649 Anemia, unspecified: Secondary | ICD-10-CM | POA: Diagnosis not present

## 2018-06-10 DIAGNOSIS — E860 Dehydration: Secondary | ICD-10-CM | POA: Diagnosis not present

## 2018-06-10 DIAGNOSIS — D509 Iron deficiency anemia, unspecified: Secondary | ICD-10-CM | POA: Diagnosis not present

## 2018-06-13 DIAGNOSIS — N189 Chronic kidney disease, unspecified: Secondary | ICD-10-CM | POA: Diagnosis not present

## 2018-06-13 DIAGNOSIS — D649 Anemia, unspecified: Secondary | ICD-10-CM | POA: Diagnosis not present

## 2018-06-13 DIAGNOSIS — I959 Hypotension, unspecified: Secondary | ICD-10-CM | POA: Diagnosis not present

## 2018-06-14 DIAGNOSIS — Z79899 Other long term (current) drug therapy: Secondary | ICD-10-CM | POA: Diagnosis not present

## 2018-06-16 DIAGNOSIS — I739 Peripheral vascular disease, unspecified: Secondary | ICD-10-CM | POA: Diagnosis not present

## 2018-06-16 DIAGNOSIS — N189 Chronic kidney disease, unspecified: Secondary | ICD-10-CM | POA: Diagnosis not present

## 2018-06-16 DIAGNOSIS — E860 Dehydration: Secondary | ICD-10-CM | POA: Diagnosis not present

## 2018-06-16 DIAGNOSIS — I10 Essential (primary) hypertension: Secondary | ICD-10-CM | POA: Diagnosis not present

## 2018-06-17 DIAGNOSIS — L97413 Non-pressure chronic ulcer of right heel and midfoot with necrosis of muscle: Secondary | ICD-10-CM | POA: Diagnosis not present

## 2018-06-21 DIAGNOSIS — L97409 Non-pressure chronic ulcer of unspecified heel and midfoot with unspecified severity: Secondary | ICD-10-CM | POA: Diagnosis not present

## 2018-06-21 DIAGNOSIS — N189 Chronic kidney disease, unspecified: Secondary | ICD-10-CM | POA: Diagnosis not present

## 2018-06-22 DIAGNOSIS — I739 Peripheral vascular disease, unspecified: Secondary | ICD-10-CM | POA: Diagnosis not present

## 2018-06-22 DIAGNOSIS — E11621 Type 2 diabetes mellitus with foot ulcer: Secondary | ICD-10-CM | POA: Diagnosis not present

## 2018-06-22 DIAGNOSIS — L97412 Non-pressure chronic ulcer of right heel and midfoot with fat layer exposed: Secondary | ICD-10-CM | POA: Diagnosis not present

## 2018-06-22 DIAGNOSIS — E860 Dehydration: Secondary | ICD-10-CM | POA: Diagnosis not present

## 2018-06-22 DIAGNOSIS — N179 Acute kidney failure, unspecified: Secondary | ICD-10-CM | POA: Diagnosis not present

## 2018-06-22 DIAGNOSIS — Z79899 Other long term (current) drug therapy: Secondary | ICD-10-CM | POA: Diagnosis not present

## 2018-06-24 DIAGNOSIS — N390 Urinary tract infection, site not specified: Secondary | ICD-10-CM | POA: Diagnosis not present

## 2018-06-24 DIAGNOSIS — E875 Hyperkalemia: Secondary | ICD-10-CM | POA: Diagnosis not present

## 2018-06-27 DIAGNOSIS — I10 Essential (primary) hypertension: Secondary | ICD-10-CM | POA: Diagnosis not present

## 2018-06-27 DIAGNOSIS — N390 Urinary tract infection, site not specified: Secondary | ICD-10-CM | POA: Diagnosis not present

## 2018-06-27 DIAGNOSIS — N189 Chronic kidney disease, unspecified: Secondary | ICD-10-CM | POA: Diagnosis not present

## 2018-06-29 DIAGNOSIS — L97412 Non-pressure chronic ulcer of right heel and midfoot with fat layer exposed: Secondary | ICD-10-CM | POA: Diagnosis not present

## 2018-06-30 DIAGNOSIS — E875 Hyperkalemia: Secondary | ICD-10-CM | POA: Diagnosis not present

## 2018-07-01 DIAGNOSIS — K5900 Constipation, unspecified: Secondary | ICD-10-CM | POA: Diagnosis not present

## 2018-07-01 DIAGNOSIS — N189 Chronic kidney disease, unspecified: Secondary | ICD-10-CM | POA: Diagnosis not present

## 2018-07-01 DIAGNOSIS — E875 Hyperkalemia: Secondary | ICD-10-CM | POA: Diagnosis not present

## 2018-07-04 DIAGNOSIS — E875 Hyperkalemia: Secondary | ICD-10-CM | POA: Diagnosis not present

## 2018-07-05 DIAGNOSIS — E875 Hyperkalemia: Secondary | ICD-10-CM | POA: Diagnosis not present

## 2018-07-05 DIAGNOSIS — N189 Chronic kidney disease, unspecified: Secondary | ICD-10-CM | POA: Diagnosis not present

## 2018-07-06 DIAGNOSIS — L97419 Non-pressure chronic ulcer of right heel and midfoot with unspecified severity: Secondary | ICD-10-CM | POA: Diagnosis not present

## 2018-07-06 DIAGNOSIS — M62561 Muscle wasting and atrophy, not elsewhere classified, right lower leg: Secondary | ICD-10-CM | POA: Diagnosis not present

## 2018-07-06 DIAGNOSIS — D649 Anemia, unspecified: Secondary | ICD-10-CM | POA: Diagnosis not present

## 2018-07-06 DIAGNOSIS — E875 Hyperkalemia: Secondary | ICD-10-CM | POA: Diagnosis not present

## 2018-07-06 DIAGNOSIS — L97311 Non-pressure chronic ulcer of right ankle limited to breakdown of skin: Secondary | ICD-10-CM | POA: Diagnosis not present

## 2018-07-11 DIAGNOSIS — E875 Hyperkalemia: Secondary | ICD-10-CM | POA: Diagnosis not present

## 2018-07-12 DIAGNOSIS — R109 Unspecified abdominal pain: Secondary | ICD-10-CM | POA: Diagnosis not present

## 2018-07-12 DIAGNOSIS — E875 Hyperkalemia: Secondary | ICD-10-CM | POA: Diagnosis not present

## 2018-07-12 DIAGNOSIS — M79673 Pain in unspecified foot: Secondary | ICD-10-CM | POA: Diagnosis not present

## 2018-07-19 DIAGNOSIS — D649 Anemia, unspecified: Secondary | ICD-10-CM | POA: Diagnosis not present

## 2018-07-20 DIAGNOSIS — R109 Unspecified abdominal pain: Secondary | ICD-10-CM | POA: Diagnosis not present

## 2018-07-20 DIAGNOSIS — M79673 Pain in unspecified foot: Secondary | ICD-10-CM | POA: Diagnosis not present

## 2018-08-12 DIAGNOSIS — N189 Chronic kidney disease, unspecified: Secondary | ICD-10-CM | POA: Diagnosis not present

## 2018-08-12 DIAGNOSIS — R339 Retention of urine, unspecified: Secondary | ICD-10-CM | POA: Diagnosis not present

## 2018-08-12 DIAGNOSIS — I739 Peripheral vascular disease, unspecified: Secondary | ICD-10-CM | POA: Diagnosis not present

## 2018-08-12 DIAGNOSIS — I10 Essential (primary) hypertension: Secondary | ICD-10-CM | POA: Diagnosis not present

## 2018-08-17 DIAGNOSIS — R2681 Unsteadiness on feet: Secondary | ICD-10-CM | POA: Diagnosis not present

## 2018-08-17 DIAGNOSIS — R627 Adult failure to thrive: Secondary | ICD-10-CM | POA: Diagnosis not present

## 2018-08-17 DIAGNOSIS — I739 Peripheral vascular disease, unspecified: Secondary | ICD-10-CM | POA: Diagnosis not present

## 2018-08-17 DIAGNOSIS — R1312 Dysphagia, oropharyngeal phase: Secondary | ICD-10-CM | POA: Diagnosis not present

## 2018-08-17 DIAGNOSIS — N189 Chronic kidney disease, unspecified: Secondary | ICD-10-CM | POA: Diagnosis not present

## 2018-08-17 DIAGNOSIS — R293 Abnormal posture: Secondary | ICD-10-CM | POA: Diagnosis not present

## 2018-08-17 DIAGNOSIS — M6281 Muscle weakness (generalized): Secondary | ICD-10-CM | POA: Diagnosis not present

## 2018-08-17 DIAGNOSIS — D649 Anemia, unspecified: Secondary | ICD-10-CM | POA: Diagnosis not present

## 2018-08-18 DIAGNOSIS — R2681 Unsteadiness on feet: Secondary | ICD-10-CM | POA: Diagnosis not present

## 2018-08-18 DIAGNOSIS — R293 Abnormal posture: Secondary | ICD-10-CM | POA: Diagnosis not present

## 2018-08-18 DIAGNOSIS — M6281 Muscle weakness (generalized): Secondary | ICD-10-CM | POA: Diagnosis not present

## 2018-08-18 DIAGNOSIS — I739 Peripheral vascular disease, unspecified: Secondary | ICD-10-CM | POA: Diagnosis not present

## 2018-08-18 DIAGNOSIS — D649 Anemia, unspecified: Secondary | ICD-10-CM | POA: Diagnosis not present

## 2018-08-18 DIAGNOSIS — R1312 Dysphagia, oropharyngeal phase: Secondary | ICD-10-CM | POA: Diagnosis not present

## 2018-08-18 DIAGNOSIS — R627 Adult failure to thrive: Secondary | ICD-10-CM | POA: Diagnosis not present

## 2018-08-18 DIAGNOSIS — N189 Chronic kidney disease, unspecified: Secondary | ICD-10-CM | POA: Diagnosis not present

## 2018-08-19 DIAGNOSIS — D649 Anemia, unspecified: Secondary | ICD-10-CM | POA: Diagnosis not present

## 2018-08-19 DIAGNOSIS — R293 Abnormal posture: Secondary | ICD-10-CM | POA: Diagnosis not present

## 2018-08-19 DIAGNOSIS — R1312 Dysphagia, oropharyngeal phase: Secondary | ICD-10-CM | POA: Diagnosis not present

## 2018-08-19 DIAGNOSIS — I739 Peripheral vascular disease, unspecified: Secondary | ICD-10-CM | POA: Diagnosis not present

## 2018-08-19 DIAGNOSIS — M6281 Muscle weakness (generalized): Secondary | ICD-10-CM | POA: Diagnosis not present

## 2018-08-19 DIAGNOSIS — N189 Chronic kidney disease, unspecified: Secondary | ICD-10-CM | POA: Diagnosis not present

## 2018-08-19 DIAGNOSIS — R627 Adult failure to thrive: Secondary | ICD-10-CM | POA: Diagnosis not present

## 2018-08-19 DIAGNOSIS — R2681 Unsteadiness on feet: Secondary | ICD-10-CM | POA: Diagnosis not present

## 2018-08-22 DIAGNOSIS — I739 Peripheral vascular disease, unspecified: Secondary | ICD-10-CM | POA: Diagnosis not present

## 2018-08-22 DIAGNOSIS — R627 Adult failure to thrive: Secondary | ICD-10-CM | POA: Diagnosis not present

## 2018-08-22 DIAGNOSIS — N189 Chronic kidney disease, unspecified: Secondary | ICD-10-CM | POA: Diagnosis not present

## 2018-08-22 DIAGNOSIS — M6281 Muscle weakness (generalized): Secondary | ICD-10-CM | POA: Diagnosis not present

## 2018-08-22 DIAGNOSIS — R2681 Unsteadiness on feet: Secondary | ICD-10-CM | POA: Diagnosis not present

## 2018-08-22 DIAGNOSIS — R1312 Dysphagia, oropharyngeal phase: Secondary | ICD-10-CM | POA: Diagnosis not present

## 2018-08-22 DIAGNOSIS — R293 Abnormal posture: Secondary | ICD-10-CM | POA: Diagnosis not present

## 2018-08-22 DIAGNOSIS — D649 Anemia, unspecified: Secondary | ICD-10-CM | POA: Diagnosis not present

## 2018-08-23 DIAGNOSIS — M6281 Muscle weakness (generalized): Secondary | ICD-10-CM | POA: Diagnosis not present

## 2018-08-23 DIAGNOSIS — N189 Chronic kidney disease, unspecified: Secondary | ICD-10-CM | POA: Diagnosis not present

## 2018-08-23 DIAGNOSIS — R293 Abnormal posture: Secondary | ICD-10-CM | POA: Diagnosis not present

## 2018-08-23 DIAGNOSIS — R2681 Unsteadiness on feet: Secondary | ICD-10-CM | POA: Diagnosis not present

## 2018-08-23 DIAGNOSIS — R1312 Dysphagia, oropharyngeal phase: Secondary | ICD-10-CM | POA: Diagnosis not present

## 2018-08-23 DIAGNOSIS — M79673 Pain in unspecified foot: Secondary | ICD-10-CM | POA: Diagnosis not present

## 2018-08-23 DIAGNOSIS — D649 Anemia, unspecified: Secondary | ICD-10-CM | POA: Diagnosis not present

## 2018-08-23 DIAGNOSIS — I739 Peripheral vascular disease, unspecified: Secondary | ICD-10-CM | POA: Diagnosis not present

## 2018-08-23 DIAGNOSIS — R627 Adult failure to thrive: Secondary | ICD-10-CM | POA: Diagnosis not present

## 2018-08-24 DIAGNOSIS — D649 Anemia, unspecified: Secondary | ICD-10-CM | POA: Diagnosis not present

## 2018-08-24 DIAGNOSIS — N189 Chronic kidney disease, unspecified: Secondary | ICD-10-CM | POA: Diagnosis not present

## 2018-08-24 DIAGNOSIS — R1312 Dysphagia, oropharyngeal phase: Secondary | ICD-10-CM | POA: Diagnosis not present

## 2018-08-24 DIAGNOSIS — I739 Peripheral vascular disease, unspecified: Secondary | ICD-10-CM | POA: Diagnosis not present

## 2018-08-24 DIAGNOSIS — R293 Abnormal posture: Secondary | ICD-10-CM | POA: Diagnosis not present

## 2018-08-24 DIAGNOSIS — R2681 Unsteadiness on feet: Secondary | ICD-10-CM | POA: Diagnosis not present

## 2018-08-24 DIAGNOSIS — M6281 Muscle weakness (generalized): Secondary | ICD-10-CM | POA: Diagnosis not present

## 2018-08-24 DIAGNOSIS — R627 Adult failure to thrive: Secondary | ICD-10-CM | POA: Diagnosis not present

## 2018-08-25 DIAGNOSIS — M6281 Muscle weakness (generalized): Secondary | ICD-10-CM | POA: Diagnosis not present

## 2018-08-25 DIAGNOSIS — R1312 Dysphagia, oropharyngeal phase: Secondary | ICD-10-CM | POA: Diagnosis not present

## 2018-08-25 DIAGNOSIS — R2681 Unsteadiness on feet: Secondary | ICD-10-CM | POA: Diagnosis not present

## 2018-08-25 DIAGNOSIS — R627 Adult failure to thrive: Secondary | ICD-10-CM | POA: Diagnosis not present

## 2018-08-25 DIAGNOSIS — R293 Abnormal posture: Secondary | ICD-10-CM | POA: Diagnosis not present

## 2018-08-25 DIAGNOSIS — D649 Anemia, unspecified: Secondary | ICD-10-CM | POA: Diagnosis not present

## 2018-08-25 DIAGNOSIS — N189 Chronic kidney disease, unspecified: Secondary | ICD-10-CM | POA: Diagnosis not present

## 2018-08-25 DIAGNOSIS — I739 Peripheral vascular disease, unspecified: Secondary | ICD-10-CM | POA: Diagnosis not present

## 2018-08-26 DIAGNOSIS — R627 Adult failure to thrive: Secondary | ICD-10-CM | POA: Diagnosis not present

## 2018-08-26 DIAGNOSIS — I739 Peripheral vascular disease, unspecified: Secondary | ICD-10-CM | POA: Diagnosis not present

## 2018-08-26 DIAGNOSIS — D649 Anemia, unspecified: Secondary | ICD-10-CM | POA: Diagnosis not present

## 2018-08-26 DIAGNOSIS — N189 Chronic kidney disease, unspecified: Secondary | ICD-10-CM | POA: Diagnosis not present

## 2018-08-26 DIAGNOSIS — R1312 Dysphagia, oropharyngeal phase: Secondary | ICD-10-CM | POA: Diagnosis not present

## 2018-08-26 DIAGNOSIS — M6281 Muscle weakness (generalized): Secondary | ICD-10-CM | POA: Diagnosis not present

## 2018-08-26 DIAGNOSIS — R2681 Unsteadiness on feet: Secondary | ICD-10-CM | POA: Diagnosis not present

## 2018-08-26 DIAGNOSIS — R293 Abnormal posture: Secondary | ICD-10-CM | POA: Diagnosis not present

## 2018-08-28 DIAGNOSIS — D649 Anemia, unspecified: Secondary | ICD-10-CM | POA: Diagnosis not present

## 2018-08-28 DIAGNOSIS — M6281 Muscle weakness (generalized): Secondary | ICD-10-CM | POA: Diagnosis not present

## 2018-08-28 DIAGNOSIS — R627 Adult failure to thrive: Secondary | ICD-10-CM | POA: Diagnosis not present

## 2018-08-28 DIAGNOSIS — I739 Peripheral vascular disease, unspecified: Secondary | ICD-10-CM | POA: Diagnosis not present

## 2018-08-28 DIAGNOSIS — R1312 Dysphagia, oropharyngeal phase: Secondary | ICD-10-CM | POA: Diagnosis not present

## 2018-08-28 DIAGNOSIS — R2681 Unsteadiness on feet: Secondary | ICD-10-CM | POA: Diagnosis not present

## 2018-08-28 DIAGNOSIS — N189 Chronic kidney disease, unspecified: Secondary | ICD-10-CM | POA: Diagnosis not present

## 2018-08-28 DIAGNOSIS — R293 Abnormal posture: Secondary | ICD-10-CM | POA: Diagnosis not present

## 2018-08-29 DIAGNOSIS — R293 Abnormal posture: Secondary | ICD-10-CM | POA: Diagnosis not present

## 2018-08-29 DIAGNOSIS — N189 Chronic kidney disease, unspecified: Secondary | ICD-10-CM | POA: Diagnosis not present

## 2018-08-29 DIAGNOSIS — R1312 Dysphagia, oropharyngeal phase: Secondary | ICD-10-CM | POA: Diagnosis not present

## 2018-08-29 DIAGNOSIS — D649 Anemia, unspecified: Secondary | ICD-10-CM | POA: Diagnosis not present

## 2018-08-29 DIAGNOSIS — R627 Adult failure to thrive: Secondary | ICD-10-CM | POA: Diagnosis not present

## 2018-08-29 DIAGNOSIS — M6281 Muscle weakness (generalized): Secondary | ICD-10-CM | POA: Diagnosis not present

## 2018-08-29 DIAGNOSIS — R2681 Unsteadiness on feet: Secondary | ICD-10-CM | POA: Diagnosis not present

## 2018-08-29 DIAGNOSIS — I739 Peripheral vascular disease, unspecified: Secondary | ICD-10-CM | POA: Diagnosis not present

## 2018-09-01 DIAGNOSIS — R2681 Unsteadiness on feet: Secondary | ICD-10-CM | POA: Diagnosis not present

## 2018-09-01 DIAGNOSIS — R627 Adult failure to thrive: Secondary | ICD-10-CM | POA: Diagnosis not present

## 2018-09-01 DIAGNOSIS — D649 Anemia, unspecified: Secondary | ICD-10-CM | POA: Diagnosis not present

## 2018-09-01 DIAGNOSIS — R1312 Dysphagia, oropharyngeal phase: Secondary | ICD-10-CM | POA: Diagnosis not present

## 2018-09-01 DIAGNOSIS — R293 Abnormal posture: Secondary | ICD-10-CM | POA: Diagnosis not present

## 2018-09-01 DIAGNOSIS — I739 Peripheral vascular disease, unspecified: Secondary | ICD-10-CM | POA: Diagnosis not present

## 2018-09-01 DIAGNOSIS — N189 Chronic kidney disease, unspecified: Secondary | ICD-10-CM | POA: Diagnosis not present

## 2018-09-01 DIAGNOSIS — M6281 Muscle weakness (generalized): Secondary | ICD-10-CM | POA: Diagnosis not present

## 2018-09-02 DIAGNOSIS — I739 Peripheral vascular disease, unspecified: Secondary | ICD-10-CM | POA: Diagnosis not present

## 2018-09-02 DIAGNOSIS — R293 Abnormal posture: Secondary | ICD-10-CM | POA: Diagnosis not present

## 2018-09-02 DIAGNOSIS — D649 Anemia, unspecified: Secondary | ICD-10-CM | POA: Diagnosis not present

## 2018-09-02 DIAGNOSIS — R1312 Dysphagia, oropharyngeal phase: Secondary | ICD-10-CM | POA: Diagnosis not present

## 2018-09-02 DIAGNOSIS — M6281 Muscle weakness (generalized): Secondary | ICD-10-CM | POA: Diagnosis not present

## 2018-09-02 DIAGNOSIS — N189 Chronic kidney disease, unspecified: Secondary | ICD-10-CM | POA: Diagnosis not present

## 2018-09-02 DIAGNOSIS — R627 Adult failure to thrive: Secondary | ICD-10-CM | POA: Diagnosis not present

## 2018-09-02 DIAGNOSIS — R2681 Unsteadiness on feet: Secondary | ICD-10-CM | POA: Diagnosis not present

## 2018-09-03 DIAGNOSIS — R1312 Dysphagia, oropharyngeal phase: Secondary | ICD-10-CM | POA: Diagnosis not present

## 2018-09-03 DIAGNOSIS — R627 Adult failure to thrive: Secondary | ICD-10-CM | POA: Diagnosis not present

## 2018-09-03 DIAGNOSIS — R293 Abnormal posture: Secondary | ICD-10-CM | POA: Diagnosis not present

## 2018-09-03 DIAGNOSIS — M6281 Muscle weakness (generalized): Secondary | ICD-10-CM | POA: Diagnosis not present

## 2018-09-03 DIAGNOSIS — I739 Peripheral vascular disease, unspecified: Secondary | ICD-10-CM | POA: Diagnosis not present

## 2018-09-03 DIAGNOSIS — D649 Anemia, unspecified: Secondary | ICD-10-CM | POA: Diagnosis not present

## 2018-09-03 DIAGNOSIS — R2681 Unsteadiness on feet: Secondary | ICD-10-CM | POA: Diagnosis not present

## 2018-09-03 DIAGNOSIS — N189 Chronic kidney disease, unspecified: Secondary | ICD-10-CM | POA: Diagnosis not present

## 2018-09-05 DIAGNOSIS — R1312 Dysphagia, oropharyngeal phase: Secondary | ICD-10-CM | POA: Diagnosis not present

## 2018-09-05 DIAGNOSIS — R627 Adult failure to thrive: Secondary | ICD-10-CM | POA: Diagnosis not present

## 2018-09-05 DIAGNOSIS — R293 Abnormal posture: Secondary | ICD-10-CM | POA: Diagnosis not present

## 2018-09-05 DIAGNOSIS — R0989 Other specified symptoms and signs involving the circulatory and respiratory systems: Secondary | ICD-10-CM | POA: Diagnosis not present

## 2018-09-05 DIAGNOSIS — N189 Chronic kidney disease, unspecified: Secondary | ICD-10-CM | POA: Diagnosis not present

## 2018-09-05 DIAGNOSIS — I739 Peripheral vascular disease, unspecified: Secondary | ICD-10-CM | POA: Diagnosis not present

## 2018-09-05 DIAGNOSIS — R05 Cough: Secondary | ICD-10-CM | POA: Diagnosis not present

## 2018-09-05 DIAGNOSIS — R2681 Unsteadiness on feet: Secondary | ICD-10-CM | POA: Diagnosis not present

## 2018-09-05 DIAGNOSIS — M6281 Muscle weakness (generalized): Secondary | ICD-10-CM | POA: Diagnosis not present

## 2018-09-05 DIAGNOSIS — D649 Anemia, unspecified: Secondary | ICD-10-CM | POA: Diagnosis not present

## 2018-09-06 DIAGNOSIS — M6281 Muscle weakness (generalized): Secondary | ICD-10-CM | POA: Diagnosis not present

## 2018-09-06 DIAGNOSIS — I739 Peripheral vascular disease, unspecified: Secondary | ICD-10-CM | POA: Diagnosis not present

## 2018-09-06 DIAGNOSIS — R1312 Dysphagia, oropharyngeal phase: Secondary | ICD-10-CM | POA: Diagnosis not present

## 2018-09-06 DIAGNOSIS — R2681 Unsteadiness on feet: Secondary | ICD-10-CM | POA: Diagnosis not present

## 2018-09-06 DIAGNOSIS — D649 Anemia, unspecified: Secondary | ICD-10-CM | POA: Diagnosis not present

## 2018-09-06 DIAGNOSIS — R627 Adult failure to thrive: Secondary | ICD-10-CM | POA: Diagnosis not present

## 2018-09-06 DIAGNOSIS — R293 Abnormal posture: Secondary | ICD-10-CM | POA: Diagnosis not present

## 2018-09-06 DIAGNOSIS — N189 Chronic kidney disease, unspecified: Secondary | ICD-10-CM | POA: Diagnosis not present

## 2018-09-07 DIAGNOSIS — R627 Adult failure to thrive: Secondary | ICD-10-CM | POA: Diagnosis not present

## 2018-09-07 DIAGNOSIS — N189 Chronic kidney disease, unspecified: Secondary | ICD-10-CM | POA: Diagnosis not present

## 2018-09-07 DIAGNOSIS — R1312 Dysphagia, oropharyngeal phase: Secondary | ICD-10-CM | POA: Diagnosis not present

## 2018-09-07 DIAGNOSIS — R2681 Unsteadiness on feet: Secondary | ICD-10-CM | POA: Diagnosis not present

## 2018-09-07 DIAGNOSIS — I739 Peripheral vascular disease, unspecified: Secondary | ICD-10-CM | POA: Diagnosis not present

## 2018-09-07 DIAGNOSIS — D649 Anemia, unspecified: Secondary | ICD-10-CM | POA: Diagnosis not present

## 2018-09-07 DIAGNOSIS — M6281 Muscle weakness (generalized): Secondary | ICD-10-CM | POA: Diagnosis not present

## 2018-09-07 DIAGNOSIS — R293 Abnormal posture: Secondary | ICD-10-CM | POA: Diagnosis not present

## 2018-09-08 DIAGNOSIS — I739 Peripheral vascular disease, unspecified: Secondary | ICD-10-CM | POA: Diagnosis not present

## 2018-09-08 DIAGNOSIS — R1312 Dysphagia, oropharyngeal phase: Secondary | ICD-10-CM | POA: Diagnosis not present

## 2018-09-08 DIAGNOSIS — R627 Adult failure to thrive: Secondary | ICD-10-CM | POA: Diagnosis not present

## 2018-09-08 DIAGNOSIS — D649 Anemia, unspecified: Secondary | ICD-10-CM | POA: Diagnosis not present

## 2018-09-08 DIAGNOSIS — N189 Chronic kidney disease, unspecified: Secondary | ICD-10-CM | POA: Diagnosis not present

## 2018-09-08 DIAGNOSIS — M79673 Pain in unspecified foot: Secondary | ICD-10-CM | POA: Diagnosis not present

## 2018-09-08 DIAGNOSIS — R293 Abnormal posture: Secondary | ICD-10-CM | POA: Diagnosis not present

## 2018-09-08 DIAGNOSIS — R531 Weakness: Secondary | ICD-10-CM | POA: Diagnosis not present

## 2018-09-08 DIAGNOSIS — R2681 Unsteadiness on feet: Secondary | ICD-10-CM | POA: Diagnosis not present

## 2018-09-08 DIAGNOSIS — M6281 Muscle weakness (generalized): Secondary | ICD-10-CM | POA: Diagnosis not present

## 2018-09-09 DIAGNOSIS — I739 Peripheral vascular disease, unspecified: Secondary | ICD-10-CM | POA: Diagnosis not present

## 2018-09-09 DIAGNOSIS — M6281 Muscle weakness (generalized): Secondary | ICD-10-CM | POA: Diagnosis not present

## 2018-09-09 DIAGNOSIS — R2681 Unsteadiness on feet: Secondary | ICD-10-CM | POA: Diagnosis not present

## 2018-09-09 DIAGNOSIS — D649 Anemia, unspecified: Secondary | ICD-10-CM | POA: Diagnosis not present

## 2018-09-09 DIAGNOSIS — R1312 Dysphagia, oropharyngeal phase: Secondary | ICD-10-CM | POA: Diagnosis not present

## 2018-09-09 DIAGNOSIS — R627 Adult failure to thrive: Secondary | ICD-10-CM | POA: Diagnosis not present

## 2018-09-09 DIAGNOSIS — R293 Abnormal posture: Secondary | ICD-10-CM | POA: Diagnosis not present

## 2018-09-09 DIAGNOSIS — N189 Chronic kidney disease, unspecified: Secondary | ICD-10-CM | POA: Diagnosis not present

## 2018-09-10 DIAGNOSIS — R293 Abnormal posture: Secondary | ICD-10-CM | POA: Diagnosis not present

## 2018-09-10 DIAGNOSIS — N189 Chronic kidney disease, unspecified: Secondary | ICD-10-CM | POA: Diagnosis not present

## 2018-09-10 DIAGNOSIS — R627 Adult failure to thrive: Secondary | ICD-10-CM | POA: Diagnosis not present

## 2018-09-10 DIAGNOSIS — R1312 Dysphagia, oropharyngeal phase: Secondary | ICD-10-CM | POA: Diagnosis not present

## 2018-09-10 DIAGNOSIS — R2681 Unsteadiness on feet: Secondary | ICD-10-CM | POA: Diagnosis not present

## 2018-09-10 DIAGNOSIS — D649 Anemia, unspecified: Secondary | ICD-10-CM | POA: Diagnosis not present

## 2018-09-10 DIAGNOSIS — I739 Peripheral vascular disease, unspecified: Secondary | ICD-10-CM | POA: Diagnosis not present

## 2018-09-10 DIAGNOSIS — M6281 Muscle weakness (generalized): Secondary | ICD-10-CM | POA: Diagnosis not present

## 2018-09-13 DIAGNOSIS — R627 Adult failure to thrive: Secondary | ICD-10-CM | POA: Diagnosis not present

## 2018-09-13 DIAGNOSIS — M6281 Muscle weakness (generalized): Secondary | ICD-10-CM | POA: Diagnosis not present

## 2018-09-13 DIAGNOSIS — R2681 Unsteadiness on feet: Secondary | ICD-10-CM | POA: Diagnosis not present

## 2018-09-13 DIAGNOSIS — R293 Abnormal posture: Secondary | ICD-10-CM | POA: Diagnosis not present

## 2018-09-13 DIAGNOSIS — I739 Peripheral vascular disease, unspecified: Secondary | ICD-10-CM | POA: Diagnosis not present

## 2018-09-13 DIAGNOSIS — N189 Chronic kidney disease, unspecified: Secondary | ICD-10-CM | POA: Diagnosis not present

## 2018-09-13 DIAGNOSIS — D649 Anemia, unspecified: Secondary | ICD-10-CM | POA: Diagnosis not present

## 2018-09-13 DIAGNOSIS — R1312 Dysphagia, oropharyngeal phase: Secondary | ICD-10-CM | POA: Diagnosis not present

## 2018-09-14 DIAGNOSIS — I739 Peripheral vascular disease, unspecified: Secondary | ICD-10-CM | POA: Diagnosis not present

## 2018-09-14 DIAGNOSIS — N189 Chronic kidney disease, unspecified: Secondary | ICD-10-CM | POA: Diagnosis not present

## 2018-09-14 DIAGNOSIS — R627 Adult failure to thrive: Secondary | ICD-10-CM | POA: Diagnosis not present

## 2018-09-14 DIAGNOSIS — R293 Abnormal posture: Secondary | ICD-10-CM | POA: Diagnosis not present

## 2018-09-14 DIAGNOSIS — D649 Anemia, unspecified: Secondary | ICD-10-CM | POA: Diagnosis not present

## 2018-09-14 DIAGNOSIS — R2681 Unsteadiness on feet: Secondary | ICD-10-CM | POA: Diagnosis not present

## 2018-09-14 DIAGNOSIS — M6281 Muscle weakness (generalized): Secondary | ICD-10-CM | POA: Diagnosis not present

## 2018-09-14 DIAGNOSIS — R1312 Dysphagia, oropharyngeal phase: Secondary | ICD-10-CM | POA: Diagnosis not present

## 2018-09-15 DIAGNOSIS — R293 Abnormal posture: Secondary | ICD-10-CM | POA: Diagnosis not present

## 2018-09-15 DIAGNOSIS — R1312 Dysphagia, oropharyngeal phase: Secondary | ICD-10-CM | POA: Diagnosis not present

## 2018-09-15 DIAGNOSIS — R2681 Unsteadiness on feet: Secondary | ICD-10-CM | POA: Diagnosis not present

## 2018-09-15 DIAGNOSIS — R627 Adult failure to thrive: Secondary | ICD-10-CM | POA: Diagnosis not present

## 2018-09-15 DIAGNOSIS — D649 Anemia, unspecified: Secondary | ICD-10-CM | POA: Diagnosis not present

## 2018-09-15 DIAGNOSIS — N189 Chronic kidney disease, unspecified: Secondary | ICD-10-CM | POA: Diagnosis not present

## 2018-09-15 DIAGNOSIS — M6281 Muscle weakness (generalized): Secondary | ICD-10-CM | POA: Diagnosis not present

## 2018-09-15 DIAGNOSIS — I739 Peripheral vascular disease, unspecified: Secondary | ICD-10-CM | POA: Diagnosis not present

## 2018-09-16 DIAGNOSIS — J00 Acute nasopharyngitis [common cold]: Secondary | ICD-10-CM | POA: Diagnosis not present

## 2018-09-16 DIAGNOSIS — R293 Abnormal posture: Secondary | ICD-10-CM | POA: Diagnosis not present

## 2018-09-16 DIAGNOSIS — R1312 Dysphagia, oropharyngeal phase: Secondary | ICD-10-CM | POA: Diagnosis not present

## 2018-09-16 DIAGNOSIS — I739 Peripheral vascular disease, unspecified: Secondary | ICD-10-CM | POA: Diagnosis not present

## 2018-09-16 DIAGNOSIS — R2681 Unsteadiness on feet: Secondary | ICD-10-CM | POA: Diagnosis not present

## 2018-09-16 DIAGNOSIS — D649 Anemia, unspecified: Secondary | ICD-10-CM | POA: Diagnosis not present

## 2018-09-16 DIAGNOSIS — M6281 Muscle weakness (generalized): Secondary | ICD-10-CM | POA: Diagnosis not present

## 2018-09-16 DIAGNOSIS — N189 Chronic kidney disease, unspecified: Secondary | ICD-10-CM | POA: Diagnosis not present

## 2018-09-16 DIAGNOSIS — R627 Adult failure to thrive: Secondary | ICD-10-CM | POA: Diagnosis not present

## 2018-10-20 DIAGNOSIS — E0822 Diabetes mellitus due to underlying condition with diabetic chronic kidney disease: Secondary | ICD-10-CM | POA: Diagnosis not present

## 2018-10-20 DIAGNOSIS — I7389 Other specified peripheral vascular diseases: Secondary | ICD-10-CM | POA: Diagnosis not present

## 2018-10-20 DIAGNOSIS — R1084 Generalized abdominal pain: Secondary | ICD-10-CM | POA: Diagnosis not present

## 2018-10-20 DIAGNOSIS — E0841 Diabetes mellitus due to underlying condition with diabetic mononeuropathy: Secondary | ICD-10-CM | POA: Diagnosis not present

## 2018-10-20 DIAGNOSIS — I10 Essential (primary) hypertension: Secondary | ICD-10-CM | POA: Diagnosis not present

## 2018-12-27 DIAGNOSIS — E1149 Type 2 diabetes mellitus with other diabetic neurological complication: Secondary | ICD-10-CM | POA: Diagnosis not present

## 2018-12-27 DIAGNOSIS — R1084 Generalized abdominal pain: Secondary | ICD-10-CM | POA: Diagnosis not present

## 2018-12-27 DIAGNOSIS — R338 Other retention of urine: Secondary | ICD-10-CM | POA: Diagnosis not present

## 2018-12-27 DIAGNOSIS — I10 Essential (primary) hypertension: Secondary | ICD-10-CM | POA: Diagnosis not present

## 2018-12-29 DIAGNOSIS — E1149 Type 2 diabetes mellitus with other diabetic neurological complication: Secondary | ICD-10-CM | POA: Diagnosis not present

## 2018-12-29 DIAGNOSIS — I10 Essential (primary) hypertension: Secondary | ICD-10-CM | POA: Diagnosis not present

## 2018-12-29 DIAGNOSIS — E875 Hyperkalemia: Secondary | ICD-10-CM | POA: Diagnosis not present

## 2018-12-29 DIAGNOSIS — D631 Anemia in chronic kidney disease: Secondary | ICD-10-CM | POA: Diagnosis not present

## 2018-12-30 DIAGNOSIS — B349 Viral infection, unspecified: Secondary | ICD-10-CM | POA: Diagnosis not present

## 2019-01-03 DIAGNOSIS — E875 Hyperkalemia: Secondary | ICD-10-CM | POA: Diagnosis not present

## 2019-01-03 DIAGNOSIS — I10 Essential (primary) hypertension: Secondary | ICD-10-CM | POA: Diagnosis not present

## 2019-01-03 DIAGNOSIS — Z03818 Encounter for observation for suspected exposure to other biological agents ruled out: Secondary | ICD-10-CM | POA: Diagnosis not present

## 2019-04-21 ENCOUNTER — Other Ambulatory Visit: Payer: Self-pay

## 2019-04-21 ENCOUNTER — Encounter (HOSPITAL_COMMUNITY): Payer: Self-pay

## 2019-04-21 ENCOUNTER — Inpatient Hospital Stay (HOSPITAL_COMMUNITY)
Admission: EM | Admit: 2019-04-21 | Discharge: 2019-04-26 | DRG: 683 | Disposition: A | Discharge status: Skilled Nursing Facility | Payer: Medicare Other | Source: Skilled Nursing Facility | Attending: Internal Medicine | Admitting: Internal Medicine

## 2019-04-21 DIAGNOSIS — Z993 Dependence on wheelchair: Secondary | ICD-10-CM

## 2019-04-21 DIAGNOSIS — L97519 Non-pressure chronic ulcer of other part of right foot with unspecified severity: Secondary | ICD-10-CM | POA: Diagnosis present

## 2019-04-21 DIAGNOSIS — N136 Pyonephrosis: Secondary | ICD-10-CM | POA: Diagnosis present

## 2019-04-21 DIAGNOSIS — E08621 Diabetes mellitus due to underlying condition with foot ulcer: Secondary | ICD-10-CM | POA: Diagnosis not present

## 2019-04-21 DIAGNOSIS — R7881 Bacteremia: Secondary | ICD-10-CM | POA: Diagnosis present

## 2019-04-21 DIAGNOSIS — F0390 Unspecified dementia without behavioral disturbance: Secondary | ICD-10-CM | POA: Diagnosis present

## 2019-04-21 DIAGNOSIS — E875 Hyperkalemia: Secondary | ICD-10-CM | POA: Diagnosis present

## 2019-04-21 DIAGNOSIS — E0842 Diabetes mellitus due to underlying condition with diabetic polyneuropathy: Secondary | ICD-10-CM | POA: Diagnosis not present

## 2019-04-21 DIAGNOSIS — E1122 Type 2 diabetes mellitus with diabetic chronic kidney disease: Secondary | ICD-10-CM | POA: Diagnosis present

## 2019-04-21 DIAGNOSIS — Z20828 Contact with and (suspected) exposure to other viral communicable diseases: Secondary | ICD-10-CM | POA: Diagnosis present

## 2019-04-21 DIAGNOSIS — R5381 Other malaise: Secondary | ICD-10-CM | POA: Diagnosis present

## 2019-04-21 DIAGNOSIS — E11621 Type 2 diabetes mellitus with foot ulcer: Secondary | ICD-10-CM | POA: Diagnosis present

## 2019-04-21 DIAGNOSIS — L97411 Non-pressure chronic ulcer of right heel and midfoot limited to breakdown of skin: Secondary | ICD-10-CM

## 2019-04-21 DIAGNOSIS — R627 Adult failure to thrive: Secondary | ICD-10-CM | POA: Diagnosis present

## 2019-04-21 DIAGNOSIS — L97501 Non-pressure chronic ulcer of other part of unspecified foot limited to breakdown of skin: Secondary | ICD-10-CM | POA: Diagnosis present

## 2019-04-21 DIAGNOSIS — E785 Hyperlipidemia, unspecified: Secondary | ICD-10-CM | POA: Diagnosis present

## 2019-04-21 DIAGNOSIS — N183 Chronic kidney disease, stage 3 (moderate): Secondary | ICD-10-CM

## 2019-04-21 DIAGNOSIS — Z79899 Other long term (current) drug therapy: Secondary | ICD-10-CM

## 2019-04-21 DIAGNOSIS — E872 Acidosis, unspecified: Secondary | ICD-10-CM | POA: Diagnosis present

## 2019-04-21 DIAGNOSIS — N390 Urinary tract infection, site not specified: Secondary | ICD-10-CM | POA: Diagnosis present

## 2019-04-21 DIAGNOSIS — N179 Acute kidney failure, unspecified: Secondary | ICD-10-CM | POA: Diagnosis not present

## 2019-04-21 DIAGNOSIS — D638 Anemia in other chronic diseases classified elsewhere: Secondary | ICD-10-CM | POA: Diagnosis present

## 2019-04-21 DIAGNOSIS — N184 Chronic kidney disease, stage 4 (severe): Secondary | ICD-10-CM | POA: Diagnosis present

## 2019-04-21 DIAGNOSIS — F17210 Nicotine dependence, cigarettes, uncomplicated: Secondary | ICD-10-CM | POA: Diagnosis present

## 2019-04-21 DIAGNOSIS — Z7982 Long term (current) use of aspirin: Secondary | ICD-10-CM

## 2019-04-21 DIAGNOSIS — D649 Anemia, unspecified: Secondary | ICD-10-CM | POA: Diagnosis present

## 2019-04-21 DIAGNOSIS — Z794 Long term (current) use of insulin: Secondary | ICD-10-CM

## 2019-04-21 DIAGNOSIS — B965 Pseudomonas (aeruginosa) (mallei) (pseudomallei) as the cause of diseases classified elsewhere: Secondary | ICD-10-CM | POA: Diagnosis present

## 2019-04-21 DIAGNOSIS — I129 Hypertensive chronic kidney disease with stage 1 through stage 4 chronic kidney disease, or unspecified chronic kidney disease: Secondary | ICD-10-CM | POA: Diagnosis present

## 2019-04-21 DIAGNOSIS — Z9862 Peripheral vascular angioplasty status: Secondary | ICD-10-CM

## 2019-04-21 DIAGNOSIS — I10 Essential (primary) hypertension: Secondary | ICD-10-CM | POA: Diagnosis not present

## 2019-04-21 DIAGNOSIS — E1140 Type 2 diabetes mellitus with diabetic neuropathy, unspecified: Secondary | ICD-10-CM | POA: Diagnosis present

## 2019-04-21 DIAGNOSIS — I739 Peripheral vascular disease, unspecified: Secondary | ICD-10-CM | POA: Diagnosis present

## 2019-04-21 DIAGNOSIS — E1151 Type 2 diabetes mellitus with diabetic peripheral angiopathy without gangrene: Secondary | ICD-10-CM | POA: Diagnosis present

## 2019-04-21 LAB — BASIC METABOLIC PANEL
Anion gap: 6 (ref 5–15)
BUN: 36 mg/dL — ABNORMAL HIGH (ref 8–23)
CO2: 20 mmol/L — ABNORMAL LOW (ref 22–32)
Calcium: 8.1 mg/dL — ABNORMAL LOW (ref 8.9–10.3)
Chloride: 114 mmol/L — ABNORMAL HIGH (ref 98–111)
Creatinine, Ser: 2.33 mg/dL — ABNORMAL HIGH (ref 0.44–1.00)
GFR calc Af Amer: 23 mL/min — ABNORMAL LOW (ref >60)
GFR calc non Af Amer: 20 mL/min — ABNORMAL LOW (ref >60)
Glucose, Bld: 123 mg/dL — ABNORMAL HIGH (ref 70–99)
Potassium: 6 mmol/L — ABNORMAL HIGH (ref 3.5–5.1)
Sodium: 140 mmol/L (ref 135–145)

## 2019-04-21 LAB — I-STAT CHEM 8, ED
BUN: 36 mg/dL — ABNORMAL HIGH (ref 8–23)
Calcium, Ion: 1.13 mmol/L — ABNORMAL LOW (ref 1.15–1.40)
Chloride: 113 mmol/L — ABNORMAL HIGH (ref 98–111)
Creatinine, Ser: 2.1 mg/dL — ABNORMAL HIGH (ref 0.44–1.00)
Glucose, Bld: 119 mg/dL — ABNORMAL HIGH (ref 70–99)
HCT: 34 % — ABNORMAL LOW (ref 36.0–46.0)
Hemoglobin: 11.6 g/dL — ABNORMAL LOW (ref 12.0–15.0)
Potassium: 5.9 mmol/L — ABNORMAL HIGH (ref 3.5–5.1)
Sodium: 141 mmol/L (ref 135–145)
TCO2: 20 mmol/L — ABNORMAL LOW (ref 22–32)

## 2019-04-21 LAB — URINALYSIS, ROUTINE W REFLEX MICROSCOPIC
Bilirubin Urine: NEGATIVE
Glucose, UA: NEGATIVE mg/dL
Ketones, ur: NEGATIVE mg/dL
Nitrite: POSITIVE — AB
Protein, ur: 30 mg/dL — AB
Specific Gravity, Urine: 1.012 (ref 1.005–1.030)
WBC, UA: >50 WBC/hpf — ABNORMAL HIGH (ref 0–5)
pH: 7 (ref 5.0–8.0)

## 2019-04-21 LAB — BLOOD GAS, VENOUS
Acid-base deficit: 4.9 mmol/L — ABNORMAL HIGH (ref 0.0–2.0)
Bicarbonate: 21.9 mmol/L (ref 20.0–28.0)
FIO2: 21
O2 Saturation: 42.7 %
Patient temperature: 98.5
pCO2, Ven: 51.9 mmHg (ref 44.0–60.0)
pH, Ven: 7.248 — ABNORMAL LOW (ref 7.250–7.430)

## 2019-04-21 LAB — CBC WITH DIFFERENTIAL/PLATELET
Abs Immature Granulocytes: 0.02 10*3/uL (ref 0.00–0.07)
Basophils Absolute: 0.1 10*3/uL (ref 0.0–0.1)
Basophils Relative: 1 %
Eosinophils Absolute: 0.1 10*3/uL (ref 0.0–0.5)
Eosinophils Relative: 2 %
HCT: 34 % — ABNORMAL LOW (ref 36.0–46.0)
Hemoglobin: 10 g/dL — ABNORMAL LOW (ref 12.0–15.0)
Immature Granulocytes: 0 %
Lymphocytes Relative: 42 %
Lymphs Abs: 3.1 10*3/uL (ref 0.7–4.0)
MCH: 29.4 pg (ref 26.0–34.0)
MCHC: 29.4 g/dL — ABNORMAL LOW (ref 30.0–36.0)
MCV: 100 fL (ref 80.0–100.0)
Monocytes Absolute: 0.6 10*3/uL (ref 0.1–1.0)
Monocytes Relative: 8 %
Neutro Abs: 3.5 10*3/uL (ref 1.7–7.7)
Neutrophils Relative %: 47 %
Platelets: 216 10*3/uL (ref 150–400)
RBC: 3.4 MIL/uL — ABNORMAL LOW (ref 3.87–5.11)
RDW: 16.5 % — ABNORMAL HIGH (ref 11.5–15.5)
WBC: 7.4 10*3/uL (ref 4.0–10.5)
nRBC: 0 % (ref 0.0–0.2)

## 2019-04-21 LAB — HEPATIC FUNCTION PANEL
ALT: 30 U/L (ref 0–44)
AST: 28 U/L (ref 15–41)
Albumin: 3.8 g/dL (ref 3.5–5.0)
Alkaline Phosphatase: 202 U/L — ABNORMAL HIGH (ref 38–126)
Bilirubin, Direct: 0.1 mg/dL (ref 0.0–0.2)
Indirect Bilirubin: 0.1 mg/dL — ABNORMAL LOW (ref 0.3–0.9)
Total Bilirubin: 0.2 mg/dL — ABNORMAL LOW (ref 0.3–1.2)
Total Protein: 7.2 g/dL (ref 6.5–8.1)

## 2019-04-21 LAB — VITAMIN B12: Vitamin B-12: 608 pg/mL (ref 180–914)

## 2019-04-21 LAB — CREATININE, URINE, RANDOM: Creatinine, Urine: 63.38 mg/dL

## 2019-04-21 LAB — IRON AND TIBC
Iron: 60 ug/dL (ref 28–170)
Saturation Ratios: 25 % (ref 10.4–31.8)
TIBC: 237 ug/dL — ABNORMAL LOW (ref 250–450)
UIBC: 177 ug/dL

## 2019-04-21 LAB — HEMOGLOBIN A1C
Hgb A1c MFr Bld: 5.7 % — ABNORMAL HIGH (ref 4.8–5.6)
Mean Plasma Glucose: 116.89 mg/dL

## 2019-04-21 LAB — RETICULOCYTES
Immature Retic Fract: 8.5 % (ref 2.3–15.9)
RBC.: 3.45 MIL/uL — ABNORMAL LOW (ref 3.87–5.11)
Retic Count, Absolute: 38.6 10*3/uL (ref 19.0–186.0)
Retic Ct Pct: 1.1 % (ref 0.4–3.1)

## 2019-04-21 LAB — CORTISOL: Cortisol, Plasma: 5 ug/dL

## 2019-04-21 LAB — CBG MONITORING, ED: Glucose-Capillary: 74 mg/dL (ref 70–99)

## 2019-04-21 LAB — FERRITIN: Ferritin: 27 ng/mL (ref 11–307)

## 2019-04-21 LAB — CK: Total CK: 82 U/L (ref 38–234)

## 2019-04-21 LAB — PHOSPHORUS: Phosphorus: 4.2 mg/dL (ref 2.5–4.6)

## 2019-04-21 LAB — MAGNESIUM: Magnesium: 2.3 mg/dL (ref 1.7–2.4)

## 2019-04-21 LAB — SODIUM, URINE, RANDOM: Sodium, Ur: 144 mmol/L

## 2019-04-21 LAB — FOLATE: Folate: 6.4 ng/mL (ref >5.9)

## 2019-04-21 LAB — SARS CORONAVIRUS 2 BY RT PCR (HOSPITAL ORDER, PERFORMED IN ~~LOC~~ HOSPITAL LAB): SARS Coronavirus 2: NEGATIVE

## 2019-04-21 MED ORDER — SODIUM BICARBONATE 650 MG PO TABS
650.0000 mg | ORAL_TABLET | Freq: Two times a day (BID) | ORAL | Status: DC
  Administered 2019-04-21 – 2019-04-26 (×10): 650 mg via ORAL
  Filled 2019-04-21 (×11): qty 1

## 2019-04-21 MED ORDER — HYDROCODONE-ACETAMINOPHEN 5-325 MG PO TABS
1.0000 | ORAL_TABLET | Freq: Four times a day (QID) | ORAL | Status: DC | PRN
  Administered 2019-04-22 – 2019-04-25 (×7): 1 via ORAL
  Filled 2019-04-21 (×7): qty 1

## 2019-04-21 MED ORDER — ACETAMINOPHEN 325 MG PO TABS
650.0000 mg | ORAL_TABLET | Freq: Four times a day (QID) | ORAL | Status: DC | PRN
  Administered 2019-04-22 – 2019-04-23 (×2): 650 mg via ORAL
  Filled 2019-04-21 (×2): qty 2

## 2019-04-21 MED ORDER — ONDANSETRON HCL 4 MG/2ML IJ SOLN
4.0000 mg | Freq: Four times a day (QID) | INTRAMUSCULAR | Status: DC | PRN
  Administered 2019-04-22 – 2019-04-23 (×3): 4 mg via INTRAVENOUS
  Filled 2019-04-21 (×3): qty 2

## 2019-04-21 MED ORDER — FUROSEMIDE 10 MG/ML IJ SOLN
INTRAMUSCULAR | Status: AC
  Filled 2019-04-21: qty 8

## 2019-04-21 MED ORDER — ENOXAPARIN SODIUM 30 MG/0.3ML ~~LOC~~ SOLN
30.0000 mg | SUBCUTANEOUS | Status: DC
  Administered 2019-04-21 – 2019-04-25 (×5): 30 mg via SUBCUTANEOUS
  Filled 2019-04-21 (×6): qty 0.3

## 2019-04-21 MED ORDER — SODIUM CHLORIDE 0.9 % IV SOLN
INTRAVENOUS | Status: AC
  Administered 2019-04-21: 22:00:00 via INTRAVENOUS

## 2019-04-21 MED ORDER — ASPIRIN 81 MG PO CHEW
81.0000 mg | CHEWABLE_TABLET | Freq: Every day | ORAL | Status: DC
  Administered 2019-04-21 – 2019-04-26 (×6): 81 mg via ORAL
  Filled 2019-04-21 (×6): qty 1

## 2019-04-21 MED ORDER — DULOXETINE HCL 20 MG PO CPEP
20.0000 mg | ORAL_CAPSULE | Freq: Every day | ORAL | Status: DC
  Administered 2019-04-22 – 2019-04-26 (×5): 20 mg via ORAL
  Filled 2019-04-21 (×5): qty 1

## 2019-04-21 MED ORDER — INSULIN ASPART 100 UNIT/ML ~~LOC~~ SOLN
0.0000 [IU] | SUBCUTANEOUS | Status: DC
  Administered 2019-04-22: 1 [IU] via SUBCUTANEOUS
  Administered 2019-04-22: 2 [IU] via SUBCUTANEOUS
  Administered 2019-04-22 – 2019-04-23 (×7): 1 [IU] via SUBCUTANEOUS
  Administered 2019-04-24: 2 [IU] via SUBCUTANEOUS
  Administered 2019-04-24 – 2019-04-25 (×5): 1 [IU] via SUBCUTANEOUS
  Filled 2019-04-21: qty 0.09

## 2019-04-21 MED ORDER — ONDANSETRON HCL 4 MG PO TABS: 4.0000 mg | ORAL_TABLET | Freq: Four times a day (QID) | ORAL | Status: DC | PRN

## 2019-04-21 MED ORDER — TAMSULOSIN HCL 0.4 MG PO CAPS
0.4000 mg | ORAL_CAPSULE | Freq: Every day | ORAL | Status: DC
  Administered 2019-04-21 – 2019-04-26 (×6): 0.4 mg via ORAL
  Filled 2019-04-21 (×6): qty 1

## 2019-04-21 MED ORDER — SODIUM CHLORIDE 0.9 % IV BOLUS
500.0000 mL | Freq: Once | INTRAVENOUS | Status: AC
  Administered 2019-04-21: 500 mL via INTRAVENOUS

## 2019-04-21 MED ORDER — ACETAMINOPHEN 650 MG RE SUPP: 650.0000 mg | Freq: Four times a day (QID) | RECTAL | Status: DC | PRN

## 2019-04-21 MED ORDER — SODIUM CHLORIDE 0.9 % IV SOLN
1.0000 g | INTRAVENOUS | Status: DC
  Administered 2019-04-21 – 2019-04-23 (×3): 1 g via INTRAVENOUS
  Filled 2019-04-21 (×2): qty 10
  Filled 2019-04-21 (×2): qty 1

## 2019-04-21 MED ORDER — PRAVASTATIN SODIUM 20 MG PO TABS
10.0000 mg | ORAL_TABLET | Freq: Every day | ORAL | Status: DC
  Administered 2019-04-21 – 2019-04-25 (×5): 10 mg via ORAL
  Filled 2019-04-21 (×5): qty 1

## 2019-04-21 MED ORDER — SODIUM ZIRCONIUM CYCLOSILICATE 10 G PO PACK
10.0000 g | PACK | Freq: Once | ORAL | Status: AC
  Administered 2019-04-21: 10 g via ORAL
  Filled 2019-04-21: qty 1

## 2019-04-21 MED ORDER — FUROSEMIDE 10 MG/ML IJ SOLN: 40.0000 mg | Freq: Once | INTRAMUSCULAR | Status: DC

## 2019-04-21 MED ORDER — SENNA 8.6 MG PO TABS
1.0000 | ORAL_TABLET | Freq: Two times a day (BID) | ORAL | Status: DC
  Administered 2019-04-21 – 2019-04-26 (×10): 8.6 mg via ORAL
  Filled 2019-04-21 (×10): qty 1

## 2019-04-21 MED ORDER — MIRTAZAPINE 15 MG PO TABS
7.5000 mg | ORAL_TABLET | Freq: Every day | ORAL | Status: DC
  Administered 2019-04-21 – 2019-04-25 (×5): 7.5 mg via ORAL
  Filled 2019-04-21 (×5): qty 1

## 2019-04-21 NOTE — ED Provider Notes (Signed)
[facility] DEPT Provider Note   CSN: [id number] Arrival date & time: 04/21/19  1602     History   Chief Complaint No chief complaint on file.   HPI Stephanie Olson is a 78 y.o. female.     Patient sent over here for hyperkalemia.  The history is provided by the patient. No language interpreter was used.  Weakness Severity:  Mild Onset quality:  Sudden Timing:  Constant Progression:  Worsening Chronicity:  New Context: not alcohol use   Relieved by:  Nothing Worsened by:  Nothing Ineffective treatments:  None tried Associated symptoms: no abdominal pain, no chest pain, no cough, no diarrhea, no frequency, no headaches and no seizures     Past Medical History:  Diagnosis Date  . Arthritis   . Diabetes mellitus without complication ([place])   . Diabetic foot ulcers (HCC)    RIGHT   . Hypertension   . Peripheral vascular disease [facility])     Patient Active Problem List   Diagnosis Date Noted  . Malnutrition of moderate degree 04/25/2018  . Pressure injury of skin 04/23/2018  . Acute renal failure ([place])   . Urinary retention   . AKI (acute kidney injury) ([place])   . Renal failure (ARF), acute on chronic (HCC) 04/22/2018  . Essential hypertension 04/22/2018  . Normocytic anemia 04/22/2018  . Metabolic acidosis, normal anion gap (NAG) 04/22/2018  . Hyperkalemia 04/22/2018  . Aspiration into airway 04/22/2018  . Generalized weakness 04/22/2018  . Lethargy 04/22/2018  . Poor appetite 04/22/2018  . Failure to thrive in adult 04/22/2018  . Diabetic neuropathy ([place]) 11/21/2015  . 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/2015  . Diabetic ulcer of foot, limited to breakdown of skin ([place]) 11/21/2015  . Foot pain, left 02/14/2015  . PVD (peripheral vascular disease) ([place]) 01/29/2015    Past Surgical History:  Procedure Laterality Date  . ABDOMINAL AORTAGRAM  01/29/2015  . ATHERECTOMY Right 01/29/2015   FEMORAL ARTERY   . BALLOON ANGIOPLASTY, ARTERY  Right 01/29/2015   RT FEMORAL   . FOOT SURGERY    . PERIPHERAL VASCULAR CATHETERIZATION N/A 01/29/2015   Procedure: Abdominal Aortogram w/Lower Extremity;  Surgeon: Serafina Mitchell, MD;  Location: [facility];  Service: Cardiovascular;  Laterality: N/A;     OB History   No obstetric history on file.      Home Medications    Prior to Admission medications   Medication Sig Start Date End Date Taking? Authorizing Provider  acetaminophen (TYLENOL) 500 MG tablet Take 500 mg by mouth every 6 (six) hours as needed.    [provider]  bisacodyl (DULCOLAX) 10 MG suppository Place 1 suppository (10 mg total) rectally daily as needed for moderate constipation. 04/26/18   Georgette Shell, MD  BISACODYL PO Take 10 mg by mouth as needed (constipation).     [provider]  ENSURE PLUS (ENSURE PLUS) LIQD Take 237 mLs by mouth 2 (two) times daily.    [provider]  HYDROcodone-acetaminophen (NORCO/VICODIN) 5-325 MG tablet Take 1 tablet by mouth every 6 (six) hours as needed for moderate pain. 04/27/18   Roxan Hockey, MD  loperamide (IMODIUM) 2 MG capsule Take 1 capsule (2 mg total) by mouth every 6 (six) hours as needed for diarrhea or loose stools. 04/26/18   Georgette Shell, MD  megestrol (MEGACE) 400 MG/10ML suspension Take 10 mLs (400 mg total) by mouth daily. 04/27/18   Roxan Hockey, MD  Melatonin 3 MG TABS Take 3  mg by mouth at bedtime.     [provider]  mirtazapine (REMERON) 7.5 MG tablet Take 7.5 mg by mouth at bedtime.    [provider]  ondansetron (ZOFRAN) 4 MG tablet Take 1 tablet (4 mg total) by mouth every 6 (six) hours as needed for nausea. 04/26/18   Georgette Shell, MD  sertraline (ZOLOFT) 50 MG tablet Take 75 mg by mouth daily.     [provider]    Family History Family History  Problem Relation Age of Onset  . Hypertension Mother   . Hypertension Father     Social History Social History    Tobacco Use  . Smoking status: Current Some Day Smoker    Types: Cigarettes    Last attempt to quit: 02/13/2014    Years since quitting: 5.1  . Smokeless tobacco: Never Used  . Tobacco comment: " I quit smoking along time ago "  Substance Use Topics  . Alcohol use: No    Alcohol/week: 0.0 standard drinks  . Drug use: No     Allergies   Patient has no known allergies.   Review of Systems Review of Systems  Constitutional: Negative for appetite change and fatigue.  HENT: Negative for congestion, ear discharge and sinus pressure.   Eyes: Negative for discharge.  Respiratory: Negative for cough.   Cardiovascular: Negative for chest pain.  Gastrointestinal: Negative for abdominal pain and diarrhea.  Genitourinary: Negative for frequency and hematuria.  Musculoskeletal: Negative for back pain.  Skin: Negative for rash.  Neurological: Positive for weakness. Negative for seizures and headaches.  Psychiatric/Behavioral: Negative for hallucinations.     Physical Exam Updated Vital Signs BP 97/60   Pulse (!) 104   Temp 98.5 F (36.9 C) (Oral)   Resp (!) 22   Ht 5\' 9"  (1.753 m)   Wt 68.9 kg   SpO2 94%   BMI 22.45 kg/m   Physical Exam Vitals signs and nursing note reviewed.  Constitutional:      Appearance: She is well-developed.  HENT:     Head: Normocephalic.     Nose: Nose normal.  Eyes:     General: No scleral icterus.    Conjunctiva/sclera: Conjunctivae normal.  Neck:     Musculoskeletal: Neck supple.     Thyroid: No thyromegaly.  Cardiovascular:     Rate and Rhythm: Normal rate and regular rhythm.     Heart sounds: No murmur. No friction rub. No gallop.   Pulmonary:     Breath sounds: No stridor. No wheezing or rales.  Chest:     Chest wall: No tenderness.  Abdominal:     General: There is no distension.     Tenderness: There is no abdominal tenderness. There is no rebound.  Musculoskeletal: Normal range of motion.  Lymphadenopathy:     Cervical: No  cervical adenopathy.  Skin:    Findings: No erythema or rash.  Neurological:     Mental Status: She is oriented to person, place, and time.     Motor: No abnormal muscle tone.     Coordination: Coordination normal.  Psychiatric:        Behavior: Behavior normal.      ED Treatments / Results  Labs (all labs ordered are listed, but only abnormal results are displayed) Labs Reviewed  CBC WITH DIFFERENTIAL/PLATELET - Abnormal; Notable for the following components:      Result Value   RBC 3.40 (*)    Hemoglobin 10.0 (*)  HCT 34.0 (*)    MCHC 29.4 (*)    RDW 16.5 (*)    All other components within normal limits  BASIC METABOLIC PANEL - Abnormal; Notable for the following components:   Potassium 6.0 (*)    Chloride 114 (*)    CO2 20 (*)    Glucose, Bld 123 (*)    BUN 36 (*)    Creatinine, Ser 2.33 (*)    Calcium 8.1 (*)    GFR calc non Af Amer 20 (*)    GFR calc Af Amer 23 (*)    All other components within normal limits  I-STAT CHEM 8, ED - Abnormal; Notable for the following components:   Potassium 5.9 (*)    Chloride 113 (*)    BUN 36 (*)    Creatinine, Ser 2.10 (*)    Glucose, Bld 119 (*)    Calcium, Ion 1.13 (*)    TCO2 20 (*)    Hemoglobin 11.6 (*)    HCT 34.0 (*)    All other components within normal limits  SARS CORONAVIRUS 2 (HOSPITAL ORDER, [facility])  URINALYSIS, ROUTINE W REFLEX MICROSCOPIC  CREATININE, URINE, RANDOM  SODIUM, URINE, RANDOM  MAGNESIUM  PHOSPHORUS  HEPATIC FUNCTION PANEL  CK  VITAMIN B12  FOLATE  IRON AND TIBC  FERRITIN  RETICULOCYTES    EKG EKG Interpretation  Date/Time:  Friday April 21 2019 16:55:27 EDT Ventricular Rate:  91 PR Interval:    QRS Duration: 85 QT Interval:  341 QTC Calculation: 420 R Axis:   20 Text Interpretation:  Sinus rhythm Abnormal R-wave progression, early transition Confirmed by Milton Ferguson [phone]) on 04/21/2019 5:54:35 PM   Radiology No results found.   Procedures Procedures (including critical care time)  Medications Ordered in ED Medications  sodium chloride 0.9 % bolus 500 mL (has no administration in time range)  sodium zirconium cyclosilicate (LOKELMA) packet 10 g (has no administration in time range)     Initial Impression / Assessment and Plan / ED Course  I have reviewed the triage vital signs and the nursing notes.  Pertinent labs & imaging results that were available during my care of the patient were reviewed by me and considered in my medical decision making (see chart for details).        Patient with moderate hyperkalemia.  She was treated with fluids and lokelma  Final Clinical Impressions(s) / ED Diagnoses   Final diagnoses:  None    ED Discharge Orders    None       Milton Ferguson, MD 04/21/19 Jo[facility] [place]

## 2019-04-21 NOTE — H&P (Signed)
Stephanie Olson [id number] DOB: [birth year] DOA: 04/21/2019     PCP: Stephanie Mccreedy, MD   Outpatient Specialists:     [name] Patient arrived to ER on 04/21/19 at 1602  Patient coming from:   From facility [facility] [place]  Chief Complaint: Hyperkalemia  HPI: Stephanie Olson is a 78 y.o. female with medical history significant of DM2 with diabetic ulcer, peripheral vascular disease status post angioplasty with stent on the Right LE , HTN,   Failure to thrive, Hyperkalemia  Presented with    Abnormal labs from nursing home facility Per records her potassium was 6.9 she has history of hyperkalemia in the past History right ankle ulcer followed by wound care.    Denies fever or chills, no CP no SOB, no N/V/D  Infectious risk factors:  Reports   In  ER RAPID COVID TEST   in house testing   Pending     Regarding pertinent Chronic problems:        HTN  Not on  Any medications      DM 2 -  Lab Results  Component Value Date   HGBA1C 6.8 (H) 04/23/2018    diet controlled       CKD stage III - baseline Cr 1.5   While in ER: K down to 6.0 The following Work up has been ordered so far:  Orders Placed This Encounter  Procedures  . SARS Coronavirus 2 [facility] order, Performed in [facility] hospital lab) Nasopharyngeal Nasopharyngeal Swab  . CBC with Differential/Platelet  . Basic metabolic panel  . Urinalysis, Routine w reflex microscopic  . Creatinine, urine, random  . Sodium, urine, random  . Magnesium  . Phosphorus  . Hepatic function panel  . CK  . Vitamin B12  . Folate  . Iron and TIBC  . Ferritin  . Reticulocytes  . Blood gas, venous  . Hemoglobin A1c  . Magnesium  . Phosphorus  . TSH  . Comprehensive metabolic panel  . CBC  . Cortisol  . Diet Carb Modified Fluid consistency: Thin; Room service appropriate? Yes  . Place Patient on a Cardiac Monitor  . Initiate Carrier Fluid Protocol  . STAT CBG when hypoglycemia is suspected. If  treated, recheck every 15 minutes after each treatment until CBG >/= 70 mg/dl  . Refer to Hypoglycemia Protocol Sidebar Report for treatment of CBG < 70 mg/dl  . Vital signs  . Notify physician  . Up with assistance  . If patient diabetic or glucose greater than 140 notify physician for Sliding Scale Insulin Orders  . May go off telemetry for tests/procedures  . Oral care per nursing protocol  . Initiate Oral Care Protocol  . Initiate Carrier Fluid Protocol  . RN may order General Admission PRN Orders utilizing "General Admission PRN medications" (through manage orders) for the following patient needs: allergy symptoms (Claritin), cold sores (Carmex), cough (Robitussin DM), eye irritation (Liquifilm Tears), hemorrhoids (Tucks), indigestion (Maalox), minor skin irritation (Hydrocortisone Cream), muscle pain Suezanne Jacquet Gay), nose irritation (saline nasal spray) and sore throat (Chloraseptic spray).  . No basal insulin at this time  . Cardiac Monitoring Continuous x 24 hours Indications for use: Other; other indications for use: hyperkalemia  . Full code  . Consult to hospitalist  ALL PATIENTS BEING ADMITTED/HAVING PROCEDURES NEED COVID-19 SCREENING  . Nutritional services consult  . Social work consult  . Airborne and Contact precautions  . Pulse oximetry check with vital signs  . Oxygen therapy Mode or (  Route): Nasal cannula; Liters Per Minute: 2; Keep 02 saturation: greater than 92 %  . Incentive spirometry  . I-stat chem 8, ED (not at [facility] or [facility])  . EKG 12-Lead  . EKG 12-Lead  . EKG 12-Lead  . EKG 12-Lead  . EKG 12-Lead  . Saline lock IV  . Place in observation (patient's expected length of stay will be less than 2 midnights)     Following Medications were ordered in ER: Medications  aspirin chewable tablet 81 mg (has no administration in time range)  HYDROcodone-acetaminophen (NORCO/VICODIN) 5-325 MG per tablet 1 tablet (has no administration in time range)  pravastatin (PRAVACHOL)  tablet 10 mg (has no administration in time range)  DULoxetine (CYMBALTA) DR capsule 20 mg (has no administration in time range)  mirtazapine (REMERON) tablet 7.5 mg (has no administration in time range)  senna (SENOKOT) tablet 8.6 mg (has no administration in time range)  tamsulosin (FLOMAX) capsule 0.4 mg (has no administration in time range)  acetaminophen (TYLENOL) tablet 650 mg (has no administration in time range)    Or  acetaminophen (TYLENOL) suppository 650 mg (has no administration in time range)  ondansetron (ZOFRAN) tablet 4 mg (has no administration in time range)    Or  ondansetron (ZOFRAN) injection 4 mg (has no administration in time range)  insulin aspart (novoLOG) injection 0-9 Units (has no administration in time range)  enoxaparin (LOVENOX) injection 30 mg (has no administration in time range)  0.9 %  sodium chloride infusion (has no administration in time range)  sodium bicarbonate tablet 650 mg (has no administration in time range)  sodium chloride 0.9 % bolus 500 mL (0 mLs Intravenous Stopped 04/21/19 2141)  sodium zirconium cyclosilicate (LOKELMA) packet 10 g (10 g Oral Given 04/21/19 2003)        Consult Orders  (From admission, onward)         Start     Ordered   04/21/19 1958  Nutritional services consult  Once    Provider:  (Not yet assigned)  Question:  Reason for Consult?  Answer:  malnutrition   04/21/19 1958   04/21/19 1958  Social work consult  Once    Provider:  (Not yet assigned)  Question:  Reason for Consult?  Answer:  admitted from facility   04/21/19 1958   04/21/19 1851  Consult to hospitalist  ALL PATIENTS BEING ADMITTED/HAVING PROCEDURES NEED COVID-19 SCREENING  Once    Comments: ALL PATIENTS BEING ADMITTED/HAVING PROCEDURES NEED COVID-19 SCREENING  Provider:  (Not yet assigned)  Question Answer Comment  Place call to: [facility],  call [phone]   Reason for Consult Admit      04/21/19 1850           Significant initial   Findings: Abnormal Labs Reviewed  CBC WITH DIFFERENTIAL/PLATELET - Abnormal; Notable for the following components:      Result Value   RBC 3.40 (*)    Hemoglobin 10.0 (*)    HCT 34.0 (*)    MCHC 29.4 (*)    RDW 16.5 (*)    All other components within normal limits  BASIC METABOLIC PANEL - Abnormal; Notable for the following components:   Potassium 6.0 (*)    Chloride 114 (*)    CO2 20 (*)    Glucose, Bld 123 (*)    BUN 36 (*)    Creatinine, Ser 2.33 (*)    Calcium 8.1 (*)    GFR calc non Af Amer 20 (*)  GFR calc Af Amer 23 (*)    All other components within normal limits  URINALYSIS, ROUTINE W REFLEX MICROSCOPIC - Abnormal; Notable for the following components:   Color, Urine STRAW (*)    APPearance HAZY (*)    Hgb urine dipstick SMALL (*)    Protein, ur 30 (*)    Nitrite POSITIVE (*)    Leukocytes,Ua LARGE (*)    WBC, UA >50 (*)    Bacteria, UA MANY (*)    Non Squamous Epithelial 0-5 (*)    All other components within normal limits  HEPATIC FUNCTION PANEL - Abnormal; Notable for the following components:   Alkaline Phosphatase 202 (*)    Total Bilirubin 0.2 (*)    Indirect Bilirubin 0.1 (*)    All other components within normal limits  IRON AND TIBC - Abnormal; Notable for the following components:   TIBC 237 (*)    All other components within normal limits  RETICULOCYTES - Abnormal; Notable for the following components:   RBC. 3.45 (*)    All other components within normal limits  BLOOD GAS, VENOUS - Abnormal; Notable for the following components:   pH, Ven 7.248 (*)    Acid-base deficit 4.9 (*)    All other components within normal limits  I-STAT CHEM 8, ED - Abnormal; Notable for the following components:   Potassium 5.9 (*)    Chloride 113 (*)    BUN 36 (*)    Creatinine, Ser 2.10 (*)    Glucose, Bld 119 (*)    Calcium, Ion 1.13 (*)    TCO2 20 (*)    Hemoglobin 11.6 (*)    HCT 34.0 (*)    All other components within normal limits    Otherwise labs  showing:    Recent Labs  Lab 04/21/19 1711 04/21/19 1717 04/21/19 1944  NA 140 141  --   K 6.0* 5.9*  --   CO2 20*  --   --   GLUCOSE 123* 119*  --   BUN 36* 36*  --   CREATININE 2.33* 2.10*  --   CALCIUM 8.1*  --   --   MG  --   --  2.3  PHOS  --   --  4.2    Cr   Up from baseline see below Lab Results  Component Value Date   CREATININE 2.10 (H) 04/21/2019   CREATININE 2.33 (H) 04/21/2019   CREATININE 1.44 (H) 04/27/2018    Recent Labs  Lab 04/21/19 1944  AST 28  ALT 30  ALKPHOS 202*  BILITOT 0.2*  PROT 7.2  ALBUMIN 3.8   Lab Results  Component Value Date   CALCIUM 8.1 (L) 04/21/2019   PHOS 4.2 04/21/2019      WBC       Component Value Date/Time   WBC 7.4 04/21/2019 1711   ANC    Component Value Date/Time   NEUTROABS 3.5 04/21/2019 1711    Plt: Lab Results  Component Value Date   PLT 216 04/21/2019      COVID-19 Labs   No results found for: SARSCOV2NAA   HG/HCT stable     Component Value Date/Time   HGB 11.6 (L) 04/21/2019 1717   HCT 34.0 (L) 04/21/2019 1717    DM  labs:  HbA1C: Recent Labs    04/23/18 0418  HGBA1C 6.8*       CBG (last 3)  No results for input(s): GLUCAP in the last 72 hours.  UA evidence of UTI      Urine analysis:    Component Value Date/Time   COLORURINE STRAW (A) 04/21/2019 1944   APPEARANCEUR HAZY (A) 04/21/2019 1944   LABSPEC 1.012 04/21/2019 1944   PHURINE 7.0 04/21/2019 1944   GLUCOSEU NEGATIVE 04/21/2019 1944   HGBUR SMALL (A) 04/21/2019 1944   BILIRUBINUR NEGATIVE 04/21/2019 1944   KETONESUR NEGATIVE 04/21/2019 1944   PROTEINUR 30 (A) 04/21/2019 1944   UROBILINOGEN 0.2 03/03/2010 0808   NITRITE POSITIVE (A) 04/21/2019 1944   LEUKOCYTESUR LARGE (A) 04/21/2019 1944     ECG:  Personally reviewed by me showing: HR : 91 Rhythm:  NSR,   no evidence of ischemic changes QTC 420      ED Triage Vitals  Enc Vitals Group     BP 04/21/19 1625 (!) 143/86     Pulse Rate 04/21/19 1625 98      Resp 04/21/19 1625 17     Temp 04/21/19 1625 98.5 F (36.9 C)     Temp Source 04/21/19 1625 Oral     SpO2 04/21/19 1625 98 %     Weight 04/21/19 1626 152 lb (68.9 kg)     Height 04/21/19 1626 5\' 9"  (1.753 m)     Head Circumference --      Peak Flow --      Pain Score --      Pain Loc --      Pain Edu? --      Excl. in [place]? --   TMAX(24)@       Latest  Blood pressure 97/60, pulse (!) 104, temperature 98.5 F (36.9 C), temperature source Oral, resp. rate (!) 22, height 5\' 9"  (1.753 m), weight 68.9 kg, SpO2 94 %.     Hospitalist was called for admission for hyperkalemia, AKI   Review of Systems:    Pertinent positives include: fatigue  Constitutional:  No weight loss, night sweats, Fevers, chills, weight loss  HEENT:  No headaches, Difficulty swallowing,Tooth/dental problems,Sore throat,  No sneezing, itching, ear ache, nasal congestion, post nasal drip,  Cardio-vascular:  No chest pain, Orthopnea, PND, anasarca, dizziness, palpitations.no Bilateral lower extremity swelling  GI:  No heartburn, indigestion, abdominal pain, nausea, vomiting, diarrhea, change in bowel habits, loss of appetite, melena, blood in stool, hematemesis Resp:  no shortness of breath at rest. No dyspnea on exertion, No excess mucus, no productive cough, No non-productive cough, No coughing up of blood.No change in color of mucus.No wheezing. Skin:  no rash or lesions. No jaundice GU:  no dysuria, change in color of urine, no urgency or frequency. No straining to urinate.  No flank pain.  Musculoskeletal:  No joint pain or no joint swelling. No decreased range of motion. No back pain.  Psych:  No change in mood or affect. No depression or anxiety. No memory loss.  Neuro: no localizing neurological complaints, no tingling, no weakness, no double vision, no gait abnormality, no slurred speech, no confusion  All systems reviewed and apart from [place] all are negative  Past Medical History:   Past  Medical History:  Diagnosis Date  . Arthritis   . Diabetes mellitus without complication ([place])   . Diabetic foot ulcers (HCC)    RIGHT   . Hypertension   . Peripheral vascular disease [facility])       Past Surgical History:  Procedure Laterality Date  . ABDOMINAL AORTAGRAM  01/29/2015  . ATHERECTOMY Right 01/29/2015   FEMORAL ARTERY   . BALLOON ANGIOPLASTY, ARTERY  Right 01/29/2015   RT FEMORAL   . FOOT SURGERY    . PERIPHERAL VASCULAR CATHETERIZATION N/A 01/29/2015   Procedure: Abdominal Aortogram w/Lower Extremity;  Surgeon: Serafina Mitchell, MD;  Location: [place] [facility];  Service: Cardiovascular;  Laterality: N/A;    Social History:  Ambulatory  wheelchair bound      reports that she has been smoking cigarettes. She has never used smokeless tobacco. She reports that she does not drink alcohol or use drugs.     Family History:   Family History  Problem Relation Age of Onset  . Hypertension Mother   . Hypertension Father     Allergies: No Known Allergies   Prior to Admission medications   Medication Sig Start Date End Date Taking? Authorizing Provider  acetaminophen (TYLENOL) 500 MG tablet Take 500 mg by mouth every 6 (six) hours as needed.    [provider]  bisacodyl (DULCOLAX) 10 MG suppository Place 1 suppository (10 mg total) rectally daily as needed for moderate constipation. 04/26/18   Georgette Shell, MD  BISACODYL PO Take 10 mg by mouth as needed (constipation).     [provider]  ENSURE PLUS (ENSURE PLUS) LIQD Take 237 mLs by mouth 2 (two) times daily.    [provider]  HYDROcodone-acetaminophen (NORCO/VICODIN) 5-325 MG tablet Take 1 tablet by mouth every 6 (six) hours as needed for moderate pain. 04/27/18   Roxan Hockey, MD  loperamide (IMODIUM) 2 MG capsule Take 1 capsule (2 mg total) by mouth every 6 (six) hours as needed for diarrhea or loose stools. 04/26/18   Georgette Shell, MD  megestrol (MEGACE) 400 MG/10ML  suspension Take 10 mLs (400 mg total) by mouth daily. 04/27/18   Roxan Hockey, MD  Melatonin 3 MG TABS Take 3 mg by mouth at bedtime.     [provider]  mirtazapine (REMERON) 7.5 MG tablet Take 7.5 mg by mouth at bedtime.    [provider]  ondansetron (ZOFRAN) 4 MG tablet Take 1 tablet (4 mg total) by mouth every 6 (six) hours as needed for nausea. 04/26/18   Georgette Shell, MD  sertraline (ZOLOFT) 50 MG tablet Take 75 mg by mouth daily.     [provider]   Physical Exam: Blood pressure 97/60, pulse (!) 104, temperature 98.5 F (36.9 C), temperature source Oral, resp. rate (!) 22, height 5\' 9"  (1.753 m), weight 68.9 kg, SpO2 94 %. 1. General:  in No Acute distress    Chronically ill -appearing 2. Psychological: Alert and  Oriented 3. Head/ENT:     Dry Mucous Membranes                          Head Non traumatic, neck supple                            Poor Dentition 4. SKIN: normal   decreased Skin turgor,  Skin clean Dry and intact  Bandage on Rihgh LE 5. Heart: Regular rate and rhythm no Murmur, no Rub or gallop 6. Lungs:  Clear to auscultation bilaterally, no wheezes or crackles   7. Abdomen: Soft,  non-tender, Non distended bowel sounds present 8. Lower extremities: no clubbing, cyanosis, no edema 9. Neurologically Grossly intact, moving all 4 extremities equally   10. MSK: Normal range of motion   All other LABS:     Recent Labs  Lab  04/21/19 1711 04/21/19 1717  WBC 7.4  --   NEUTROABS 3.5  --   HGB 10.0* 11.6*  HCT 34.0* 34.0*  MCV 100.0  --   PLT 216  --      Recent Labs  Lab 04/21/19 1711 04/21/19 1717 04/21/19 1944  NA 140 141  --   K 6.0* 5.9*  --   CL 114* 113*  --   CO2 20*  --   --   GLUCOSE 123* 119*  --   BUN 36* 36*  --   CREATININE 2.33* 2.10*  --   CALCIUM 8.1*  --   --   MG  --   --  2.3  PHOS  --   --  4.2     Recent Labs  Lab 04/21/19 1944  AST 28  ALT 30  ALKPHOS 202*  BILITOT 0.2*  PROT 7.2   ALBUMIN 3.8       Cultures:    Component Value Date/Time   SDES  04/22/2018 1535    BLOOD RIGHT HAND Performed at [facility], [place] [address], [place], [place] [address]    SPECREQUEST  04/22/2018 1535    BOTTLES DRAWN AEROBIC AND ANAEROBIC Blood Culture results may not be optimal due to an excessive volume of blood received in culture bottles Performed at [facility], [place] [address] [place], [place] [address]    CULT (A) 04/22/2018 1535    STAPHYLOCOCCUS SPECIES (COAGULASE NEGATIVE) THE SIGNIFICANCE OF ISOLATING THIS ORGANISM FROM A SINGLE SET OF BLOOD CULTURES WHEN MULTIPLE SETS ARE DRAWN IS UNCERTAIN. PLEASE NOTIFY THE MICROBIOLOGY DEPARTMENT WITHIN ONE WEEK IF SPECIATION AND SENSITIVITIES ARE REQUIRED. Performed at [facility], [place] [address], [place], [place] [address]    REPTSTATUS 04/25/2018 FINAL 04/22/2018 1535     Radiological Exams on Admission: No results found.  Chart has been reviewed    Assessment/Plan   78 y.o. female with medical history significant of DM2 with diabetic ulcer, peripheral vascular disease status post angioplasty with stent on the Right LE , HTN,   Failure to thrive, Hyperkalemia Admitted for hyperkalemia and AKI  Present on Admission: . PVD (peripheral vascular disease) (HCC) chronic stable  . Diabetic neuropathy ([place]) CKD type IV at this point, avoid nephrotoxic medications will need outpatient nephrology follow-up  . Diabetic ulcer of foot, limited to breakdown of skin ([place]) chronic currently appears to be stable  . Renal failure (ARF), acute on chronic (HCC) obtain urine electrolytes gently rehydrate and follow  . Essential hypertension currently stable continue to monitor  . Normocytic anemia likely anemia of chronic disease order anemia panel  . 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 bicarb 650 twice daily and continue to monitor  Dm 2-  - Order Sensitive  SSI     -  check TSH and HgA1C  - Hold by mouth medications   UTI-patient does endorse some suprapubic discomfort will treat for UTI with Rocephin await results of urine culture  Other plan as per orders.  DVT prophylaxis:    Lovenox     Code Status:  FULL CODE  as per patient   I had personally discussed CODE STATUS with patient   Family Communication:   Family not  at  Bedside    Disposition Plan:                              Back to current facility when stable                                            Social Work  consulted                                      Wound care  consulted                                      Consults called: none  , discussed with nephrology over the phone provided name and date of birth for patient to be set up as an outpatient follow-up in the clinic if possible Nephrology recommends starting on p.o. bicarb renal diet with potassium restriction and outpatient follow-up if patient is continues to have issues as an inpatient please reconsult them  Admission status:  ED Disposition    ED Disposition Condition [place]: [place] [100102]  Level of Care: Telemetry [5]  Admit to tele based on following criteria: Other see comments  Comments: hyperkalemia  Covid Evaluation: Asymptomatic Screening Protocol (No Symptoms)  Diagnosis: Hyperkalemia [id number]  Admitting Physician: Toy Baker [[id number]]  Attending Physician:  Toy Baker [[id number]]  PT Class (Do Not Modify): Observation [104]  PT Acc Code (Do Not Modify): Observation [10022]         Obs    Level of care   tele  For 12H    Precautions:   Airborne until covid testing is negative Airborne and Contact precautions  PPE: Used by the provider:   P100  eye Goggles,  Gloves      Jaquavious Mercer 04/21/2019, 9:52 PM    [facility]     after 2 AM please page floor coverage PA If 7AM-7PM, please contact the day team taking care of the patient using [email or web address]

## 2019-04-21 NOTE — ED Notes (Signed)
Son Lonzo Cloud and daughter in law given update on pt condition and treatment plan. Family also given information about visitors policy at this time.

## 2019-04-21 NOTE — ED Triage Notes (Signed)
Per EMS: Pt from [facility].  Pt had a high potassium in her blood draw yesterday of 6.9.

## 2019-04-22 ENCOUNTER — Encounter (HOSPITAL_COMMUNITY): Payer: Self-pay | Admitting: *Deleted

## 2019-04-22 ENCOUNTER — Other Ambulatory Visit: Payer: Self-pay

## 2019-04-22 DIAGNOSIS — E875 Hyperkalemia: Secondary | ICD-10-CM | POA: Diagnosis present

## 2019-04-22 DIAGNOSIS — N184 Chronic kidney disease, stage 4 (severe): Secondary | ICD-10-CM | POA: Diagnosis present

## 2019-04-22 DIAGNOSIS — E1122 Type 2 diabetes mellitus with diabetic chronic kidney disease: Secondary | ICD-10-CM | POA: Diagnosis present

## 2019-04-22 DIAGNOSIS — B965 Pseudomonas (aeruginosa) (mallei) (pseudomallei) as the cause of diseases classified elsewhere: Secondary | ICD-10-CM | POA: Diagnosis present

## 2019-04-22 DIAGNOSIS — R5381 Other malaise: Secondary | ICD-10-CM | POA: Diagnosis present

## 2019-04-22 DIAGNOSIS — L97519 Non-pressure chronic ulcer of other part of right foot with unspecified severity: Secondary | ICD-10-CM | POA: Diagnosis present

## 2019-04-22 DIAGNOSIS — E0842 Diabetes mellitus due to underlying condition with diabetic polyneuropathy: Secondary | ICD-10-CM | POA: Diagnosis not present

## 2019-04-22 DIAGNOSIS — Z20828 Contact with and (suspected) exposure to other viral communicable diseases: Secondary | ICD-10-CM | POA: Diagnosis present

## 2019-04-22 DIAGNOSIS — E0841 Diabetes mellitus due to underlying condition with diabetic mononeuropathy: Secondary | ICD-10-CM | POA: Diagnosis not present

## 2019-04-22 DIAGNOSIS — N179 Acute kidney failure, unspecified: Secondary | ICD-10-CM | POA: Diagnosis present

## 2019-04-22 DIAGNOSIS — Z79899 Other long term (current) drug therapy: Secondary | ICD-10-CM | POA: Diagnosis not present

## 2019-04-22 DIAGNOSIS — N136 Pyonephrosis: Secondary | ICD-10-CM | POA: Diagnosis present

## 2019-04-22 DIAGNOSIS — E1151 Type 2 diabetes mellitus with diabetic peripheral angiopathy without gangrene: Secondary | ICD-10-CM | POA: Diagnosis present

## 2019-04-22 DIAGNOSIS — E1140 Type 2 diabetes mellitus with diabetic neuropathy, unspecified: Secondary | ICD-10-CM | POA: Diagnosis present

## 2019-04-22 DIAGNOSIS — E785 Hyperlipidemia, unspecified: Secondary | ICD-10-CM | POA: Diagnosis present

## 2019-04-22 DIAGNOSIS — R627 Adult failure to thrive: Secondary | ICD-10-CM | POA: Diagnosis present

## 2019-04-22 DIAGNOSIS — F17210 Nicotine dependence, cigarettes, uncomplicated: Secondary | ICD-10-CM | POA: Diagnosis present

## 2019-04-22 DIAGNOSIS — N390 Urinary tract infection, site not specified: Secondary | ICD-10-CM

## 2019-04-22 DIAGNOSIS — R7881 Bacteremia: Secondary | ICD-10-CM | POA: Diagnosis present

## 2019-04-22 DIAGNOSIS — I10 Essential (primary) hypertension: Secondary | ICD-10-CM | POA: Diagnosis not present

## 2019-04-22 DIAGNOSIS — E872 Acidosis: Secondary | ICD-10-CM | POA: Diagnosis present

## 2019-04-22 DIAGNOSIS — F0390 Unspecified dementia without behavioral disturbance: Secondary | ICD-10-CM | POA: Diagnosis present

## 2019-04-22 DIAGNOSIS — I129 Hypertensive chronic kidney disease with stage 1 through stage 4 chronic kidney disease, or unspecified chronic kidney disease: Secondary | ICD-10-CM | POA: Diagnosis present

## 2019-04-22 DIAGNOSIS — Z993 Dependence on wheelchair: Secondary | ICD-10-CM | POA: Diagnosis not present

## 2019-04-22 DIAGNOSIS — D638 Anemia in other chronic diseases classified elsewhere: Secondary | ICD-10-CM | POA: Diagnosis present

## 2019-04-22 DIAGNOSIS — Z794 Long term (current) use of insulin: Secondary | ICD-10-CM | POA: Diagnosis not present

## 2019-04-22 DIAGNOSIS — E08621 Diabetes mellitus due to underlying condition with foot ulcer: Secondary | ICD-10-CM | POA: Diagnosis not present

## 2019-04-22 DIAGNOSIS — E11621 Type 2 diabetes mellitus with foot ulcer: Secondary | ICD-10-CM | POA: Diagnosis present

## 2019-04-22 DIAGNOSIS — Z7982 Long term (current) use of aspirin: Secondary | ICD-10-CM | POA: Diagnosis not present

## 2019-04-22 LAB — CBC
HCT: 33.3 % — ABNORMAL LOW (ref 36.0–46.0)
Hemoglobin: 9.9 g/dL — ABNORMAL LOW (ref 12.0–15.0)
MCH: 29.1 pg (ref 26.0–34.0)
MCHC: 29.7 g/dL — ABNORMAL LOW (ref 30.0–36.0)
MCV: 97.9 fL (ref 80.0–100.0)
Platelets: 220 10*3/uL (ref 150–400)
RBC: 3.4 MIL/uL — ABNORMAL LOW (ref 3.87–5.11)
RDW: 16.4 % — ABNORMAL HIGH (ref 11.5–15.5)
WBC: 10.1 10*3/uL (ref 4.0–10.5)
nRBC: 0 % (ref 0.0–0.2)

## 2019-04-22 LAB — COMPREHENSIVE METABOLIC PANEL
ALT: 27 U/L (ref 0–44)
AST: 22 U/L (ref 15–41)
Albumin: 3.8 g/dL (ref 3.5–5.0)
Alkaline Phosphatase: 192 U/L — ABNORMAL HIGH (ref 38–126)
Anion gap: 5 (ref 5–15)
BUN: 34 mg/dL — ABNORMAL HIGH (ref 8–23)
CO2: 22 mmol/L (ref 22–32)
Calcium: 8.1 mg/dL — ABNORMAL LOW (ref 8.9–10.3)
Chloride: 113 mmol/L — ABNORMAL HIGH (ref 98–111)
Creatinine, Ser: 1.9 mg/dL — ABNORMAL HIGH (ref 0.44–1.00)
GFR calc Af Amer: 29 mL/min — ABNORMAL LOW (ref >60)
GFR calc non Af Amer: 25 mL/min — ABNORMAL LOW (ref >60)
Glucose, Bld: 188 mg/dL — ABNORMAL HIGH (ref 70–99)
Potassium: 5.4 mmol/L — ABNORMAL HIGH (ref 3.5–5.1)
Sodium: 140 mmol/L (ref 135–145)
Total Bilirubin: 0.2 mg/dL — ABNORMAL LOW (ref 0.3–1.2)
Total Protein: 7 g/dL (ref 6.5–8.1)

## 2019-04-22 LAB — CBG MONITORING, ED
Glucose-Capillary: 122 mg/dL — ABNORMAL HIGH (ref 70–99)
Glucose-Capillary: 156 mg/dL — ABNORMAL HIGH (ref 70–99)
Glucose-Capillary: 146 mg/dL — ABNORMAL HIGH (ref 70–99)
Glucose-Capillary: 141 mg/dL — ABNORMAL HIGH (ref 70–99)

## 2019-04-22 LAB — GLUCOSE, CAPILLARY
Glucose-Capillary: 110 mg/dL — ABNORMAL HIGH (ref 70–99)
Glucose-Capillary: 144 mg/dL — ABNORMAL HIGH (ref 70–99)
Glucose-Capillary: 150 mg/dL — ABNORMAL HIGH (ref 70–99)

## 2019-04-22 LAB — MAGNESIUM: Magnesium: 2 mg/dL (ref 1.7–2.4)

## 2019-04-22 LAB — PHOSPHORUS: Phosphorus: 4.2 mg/dL (ref 2.5–4.6)

## 2019-04-22 LAB — TSH: TSH: 2.246 u[IU]/mL (ref 0.350–4.500)

## 2019-04-22 MED ORDER — SODIUM ZIRCONIUM CYCLOSILICATE 10 G PO PACK
10.0000 g | PACK | Freq: Once | ORAL | Status: AC
  Administered 2019-04-22: 10 g via ORAL
  Filled 2019-04-22: qty 1

## 2019-04-22 MED ORDER — HYDRALAZINE HCL 20 MG/ML IJ SOLN: 10.0000 mg | Freq: Four times a day (QID) | INTRAMUSCULAR | Status: DC | PRN

## 2019-04-22 NOTE — ED Notes (Signed)
Pt. Documented in error Social Work Consult.

## 2019-04-22 NOTE — Evaluation (Signed)
Physical Therapy Evaluation Patient Details Name: Stephanie Olson MRN: [id number] DOB: Dec 24, 1940 Today's Date: 04/22/2019   History of Present Illness  78 y.o. female with medical history significant of DM2 with diabetic ulcer, peripheral vascular disease status post angioplasty with stent on the Right LE , HTN. Admitted from [address] with hyperkalemia  Clinical Impression  Pt admitted with above diagnosis. Min assist for bed mobility, min assist for sit to stand and to take several lateral steps at edge of bed, distance limited by fatigue. Pt currently with functional limitations due to the deficits listed below (see PT Problem List). Pt will benefit from skilled PT to increase their independence and safety with mobility to allow discharge to the venue listed below.       Follow Up Recommendations SNF;Supervision/Assistance - 24 hour;Supervision for mobility/OOB    Equipment Recommendations  Other (comment)(TBD @ facility)    Recommendations for Other Services       Precautions / Restrictions Precautions Precautions: Fall Restrictions Weight Bearing Restrictions: No      Mobility  Bed Mobility Overal bed mobility: Needs Assistance Bed Mobility: Supine to Sit;Sit to Supine     Supine to sit: HOB elevated;Modified independent (Device/Increase time) Sit to supine: Min assist   General bed mobility comments: supine to sit using rail, HOB up 30*; min A for LEs into bed with sit to supine  Transfers Overall transfer level: Needs assistance Equipment used: 1 person hand held assist Transfers: Sit to/from Stand Sit to Stand: Min assist         General transfer comment: min A to power up  Ambulation/Gait   Gait Distance (Feet): 2 Feet Assistive device: 1 person hand held assist Gait Pattern/deviations: Step-to pattern     General Gait Details: pt took 3 side steps at edge of bed, distance limited by fatigue  Stairs            Wheelchair Mobility     Modified Rankin (Stroke Patients Only)       Balance Overall balance assessment: Needs assistance   Sitting balance-Leahy Scale: Fair     Standing balance support: Single extremity supported Standing balance-Leahy Scale: Poor Standing balance comment: requires single UE support                             Pertinent Vitals/Pain Pain Assessment: No/denies pain    Home Living Family/patient expects to be discharged to:: Skilled nursing facility                      Prior Function Level of Independence: Independent with assistive device(s)         Comments: pt reports she transfers into [facility] independently, pt not a reliable historian     Hand Dominance        Extremity/Trunk Assessment   Upper Extremity Assessment Upper Extremity Assessment: Generalized weakness    Lower Extremity Assessment Lower Extremity Assessment: Generalized weakness(B knee ext 4/5)    Cervical / Trunk Assessment Cervical / Trunk Assessment: Normal  Communication   Communication: HOH  Cognition Arousal/Alertness: Awake/alert Behavior During Therapy: WFL for tasks assessed/performed Overall Cognitive Status: No family/caregiver present to determine baseline cognitive functioning                                 General Comments: able to follow 1 step commands, oriented to self  and to month/year, seems to have some memory impairment as she stated she wanted to walk later at the [facility] when I asked if she could transfer [place] Comments      Exercises     Assessment/Plan    PT Assessment Patient needs continued PT services  PT Problem List Decreased activity tolerance;Decreased balance;Decreased mobility       PT Treatment Interventions      PT Goals (Current goals can be found in the Care Plan section)  Acute Rehab PT Goals Patient Stated Goal: none stated PT Goal Formulation: Patient unable to participate in goal setting Time For  Goal Achievement: 05/06/19 Potential to Achieve Goals: Good    Frequency Min 3X/week   Barriers to discharge        Co-evaluation               AM-PAC PT "6 Clicks" Mobility  Outcome Measure Help needed turning from your back to your side while in a flat bed without using bedrails?: None Help needed moving from lying on your back to sitting on the side of a flat bed without using bedrails?: A Little Help needed moving to and from a bed to a chair (including a wheelchair)?: A Lot Help needed standing up from a chair using your arms (e.g., wheelchair or bedside chair)?: A Lot Help needed to walk in hospital room?: A Lot Help needed climbing 3-5 steps with a railing? : Total 6 Click Score: 14    End of Session   Activity Tolerance: Patient tolerated treatment well;Patient limited by fatigue Patient left: in bed;with call bell/phone within reach;with bed alarm set Nurse Communication: Mobility status PT Visit Diagnosis: Unsteadiness on feet (R26.81);Difficulty in walking, not elsewhere classified (R26.2)    Time: [id number] PT Time Calculation (min) (ACUTE ONLY): 21 min   Charges:   PT Evaluation $PT Eval Moderate Complexity: 1 Mod          Philomena Doheny PT 04/22/2019  Acute Rehabilitation Services Pager [phone] Office [phone]

## 2019-04-22 NOTE — ED Notes (Signed)
Report given to [facility] for 4E, Room 1408.

## 2019-04-22 NOTE — ED Notes (Signed)
EKG given to EDP,Wickline,MD. For review. 

## 2019-04-22 NOTE — ED Notes (Signed)
Pt. Documented in error see note in chart. 

## 2019-04-22 NOTE — ED Notes (Signed)
ED TO INPATIENT HANDOFF REPORT  Name/Age/Gender Stephanie Olson 78 y.o. female  Code Status    Code Status Orders  (From admission, onward)         Start     Ordered   04/21/19 2148  Full code  Continuous     04/21/19 2147        Code Status History    Date Active Date Inactive Code Status Order ID Comments User Context   04/22/2018 1812 04/28/2018 0310 Full Code [id number]  Kerney Elbe, DO ED   01/29/2015 2016 01/30/2015 2121 Full Code [id number]  Serafina Mitchell, MD Inpatient   01/29/2015 1733 01/29/2015 2016 Full Code [id number]  Angelia Mould, MD Inpatient   Advance Care Planning Activity      Home/SNF/Other Nursing Home  Chief Complaint Abnormal Labs  Level of Care/Admitting Diagnosis ED Disposition    ED Disposition Condition [place]: [facility] [[id number]]  Level of Care: Telemetry [5]  Admit to tele based on following criteria: Other see comments  Comments: hyperkalemia  Covid Evaluation: Asymptomatic Screening Protocol (No Symptoms)  Diagnosis: Hyperkalemia [id number]  Admitting Physician: Toy Baker [[id number]]  Attending Physician: Toy Baker [[id number]]  PT Class (Do Not Modify): Observation [104]  PT Acc Code (Do Not Modify): Observation [10022]       Medical History Past Medical History:  Diagnosis Date  . Arthritis   . Diabetes mellitus without complication ([place])   . Diabetic foot ulcers (HCC)    RIGHT   . Hypertension   . Peripheral vascular disease ([place])     Allergies No Known Allergies  IV Location/Drains/Wounds Patient Lines/Drains/Airways Status   Active Line/Drains/Airways    Name:   Placement date:   Placement time:   Site:   Days:   Peripheral IV 04/21/19 Left;Posterior Forearm   04/21/19    1945    Forearm   1   Pressure Injury 04/22/18 Stage II -  Partial thickness loss of dermis presenting as a shallow open ulcer with a red, pink wound bed without slough.    04/22/18    1829     365   Pressure Injury 04/22/18 Full thickness non-pressure would with skin graft healing   04/22/18    1833     365          Labs/Imaging Results for orders placed or performed during the hospital encounter of 04/21/19 (from the past 48 hour(s))  CBC with Differential/Platelet     Status: Abnormal   Collection Time: 04/21/19  5:11 PM  Result Value Ref Range   WBC 7.4 4.0 - 10.5 K/uL   RBC 3.40 (L) 3.87 - 5.11 MIL/uL   Hemoglobin 10.0 (L) 12.0 - 15.0 g/dL   HCT 34.0 (L) 36.0 - 46.0 %   MCV 100.0 80.0 - 100.0 fL   MCH 29.4 26.0 - 34.0 pg   MCHC 29.4 (L) 30.0 - 36.0 g/dL   RDW 16.5 (H) 11.5 - 15.5 %   Platelets 216 150 - 400 K/uL   nRBC 0.0 0.0 - 0.2 %   Neutrophils Relative % 47 %   Neutro Abs 3.5 1.7 - 7.7 K/uL   Lymphocytes Relative 42 %   Lymphs Abs 3.1 0.7 - 4.0 K/uL   Monocytes Relative 8 %   Monocytes Absolute 0.6 0.1 - 1.0 K/uL   Eosinophils Relative 2 %   Eosinophils Absolute 0.1 0.0 - 0.5 K/uL   Basophils Relative 1 %  Basophils Absolute 0.1 0.0 - 0.1 K/uL   Immature Granulocytes 0 %   Abs Immature Granulocytes 0.02 0.00 - 0.07 K/uL    Comment: Performed at [facility], [place] [address] [place], [place] [id number]  Basic metabolic panel     Status: Abnormal   Collection Time: 04/21/19  5:11 PM  Result Value Ref Range   Sodium 140 135 - 145 mmol/L   Potassium 6.0 (H) 3.5 - 5.1 mmol/L   Chloride 114 (H) 98 - 111 mmol/L   CO2 20 (L) 22 - 32 mmol/L   Glucose, Bld 123 (H) 70 - 99 mg/dL   BUN 36 (H) 8 - 23 mg/dL   Creatinine, Ser 2.33 (H) 0.44 - 1.00 mg/dL   Calcium 8.1 (L) 8.9 - 10.3 mg/dL   GFR calc non Af Amer 20 (L) >60 mL/min   GFR calc Af Amer 23 (L) >60 mL/min   Anion gap 6 5 - 15    Comment: Performed at [facility], [place] [address], [place], [place] [address]  Hemoglobin A1c     Status: Abnormal   Collection Time: 04/21/19  5:11 PM  Result Value Ref Range   Hgb A1c MFr Bld 5.7 (H) 4.8 - 5.6 %     Comment: (NOTE) Pre diabetes:          5.7%-6.4% Diabetes:              >6.4% Glycemic control for   <7.0% adults with diabetes    Mean Plasma Glucose 116.89 mg/dL    Comment: Performed at [place] [address], [place], [place] [address]  I-stat chem 8, ED (not at [facility] or [facility])     Status: Abnormal   Collection Time: 04/21/19  5:17 PM  Result Value Ref Range   Sodium 141 135 - 145 mmol/L   Potassium 5.9 (H) 3.5 - 5.1 mmol/L   Chloride 113 (H) 98 - 111 mmol/L   BUN 36 (H) 8 - 23 mg/dL   Creatinine, Ser 2.10 (H) 0.44 - 1.00 mg/dL   Glucose, Bld 119 (H) 70 - 99 mg/dL   Calcium, Ion 1.13 (L) 1.15 - 1.40 mmol/L   TCO2 20 (L) 22 - 32 mmol/L   Hemoglobin 11.6 (L) 12.0 - 15.0 g/dL   HCT 34.0 (L) 36.0 - 46.0 %  Urinalysis, Routine w reflex microscopic     Status: Abnormal   Collection Time: 04/21/19  7:44 PM  Result Value Ref Range   Color, Urine STRAW (A) YELLOW   APPearance HAZY (A) CLEAR   Specific Gravity, Urine 1.012 1.005 - 1.030   pH 7.0 5.0 - 8.0   Glucose, UA NEGATIVE NEGATIVE mg/dL   Hgb urine dipstick SMALL (A) NEGATIVE   Bilirubin Urine NEGATIVE NEGATIVE   Ketones, ur NEGATIVE NEGATIVE mg/dL   Protein, ur 30 (A) NEGATIVE mg/dL   Nitrite POSITIVE (A) NEGATIVE   Leukocytes,Ua LARGE (A) NEGATIVE   RBC / HPF 6-10 0 - 5 RBC/hpf   WBC, UA >50 (H) 0 - 5 WBC/hpf   Bacteria, UA MANY (A) NONE SEEN   Squamous Epithelial / LPF 11-20 0 - 5   Non Squamous Epithelial 0-5 (A) NONE SEEN    Comment: Performed at [facility], [place] [address] [place], [place] [address]  Creatinine, urine, random     Status: None   Collection Time: 04/21/19  7:44 PM  Result Value Ref Range   Creatinine, Urine 63.38 mg/dL  Comment: Performed at [facility], [place] [address], [place], [place] [address]  Sodium, urine, random     Status: None   Collection Time: 04/21/19  7:44 PM  Result Value Ref Range   Sodium, Ur 144 mmol/L    Comment: Performed at  [facility], [place] [address], [place], [place] [address]  Magnesium     Status: None   Collection Time: 04/21/19  7:44 PM  Result Value Ref Range   Magnesium 2.3 1.7 - 2.4 mg/dL    Comment: Performed at [facility], [place] [address], [place], [place] [address]  Phosphorus     Status: None   Collection Time: 04/21/19  7:44 PM  Result Value Ref Range   Phosphorus 4.2 2.5 - 4.6 mg/dL    Comment: Performed at [facility], [place] [address] [place], [place] [address]  Hepatic function panel     Status: Abnormal   Collection Time: 04/21/19  7:44 PM  Result Value Ref Range   Total Protein 7.2 6.5 - 8.1 g/dL   Albumin 3.8 3.5 - 5.0 g/dL   AST 28 15 - 41 U/L   ALT 30 0 - 44 U/L   Alkaline Phosphatase 202 (H) 38 - 126 U/L   Total Bilirubin 0.2 (L) 0.3 - 1.2 mg/dL   Bilirubin, Direct 0.1 0.0 - 0.2 mg/dL   Indirect Bilirubin 0.1 (L) 0.3 - 0.9 mg/dL    Comment: Performed at [facility], [place] [address] [place], [place] [address]  CK     Status: None   Collection Time: 04/21/19  7:44 PM  Result Value Ref Range   Total CK 82 38 - 234 U/L    Comment: Performed at [facility], [place] [address] [place], [place] [address]  Reticulocytes     Status: Abnormal   Collection Time: 04/21/19  7:44 PM  Result Value Ref Range   Retic Ct Pct 1.1 0.4 - 3.1 %   RBC. 3.45 (L) 3.87 - 5.11 MIL/uL   Retic Count, Absolute 38.6 19.0 - 186.0 K/uL   Immature Retic Fract 8.5 2.3 - 15.9 %    Comment: Performed at [facility], [place] [address], [place], [place] [address]  Vitamin B12     Status: None   Collection Time: 04/21/19  7:45 PM  Result Value Ref Range   Vitamin B-12 608 180 - 914 pg/mL    Comment: (NOTE) This assay is not validated for testing neonatal or myeloproliferative syndrome specimens for Vitamin B12 levels. Performed at [facility], [place] [address], [place],  [place] [address]   Folate     Status: None   Collection Time: 04/21/19  7:45 PM  Result Value Ref Range   Folate 6.4 >5.9 ng/mL    Comment: Performed at [facility], [place] [address], [place], [place] [address]  Iron and TIBC     Status: Abnormal   Collection Time: 04/21/19  7:45 PM  Result Value Ref Range   Iron 60 28 - 170 ug/dL   TIBC 237 (L) 250 - 450 ug/dL   Saturation Ratios 25 10.4 - 31.8 %   UIBC 177 ug/dL    Comment: Performed at [facility], [place] [address] [place], [place] [address]  Ferritin     Status: None   Collection Time: 04/21/19  7:45 PM  Result Value Ref Range   Ferritin 27 11 - 307 ng/mL  Comment: Performed at [facility], [place] [address], [place], [place] [address]  Blood gas, venous     Status: Abnormal   Collection Time: 04/21/19  7:59 PM  Result Value Ref Range   FIO2 21.00    pH, Ven 7.248 (L) 7.250 - 7.430   pCO2, Ven 51.9 44.0 - 60.0 mmHg   pO2, Ven BELOW REPORTABLE RANGE 32.0 - 45.0 mmHg    Comment: CRITICAL RESULT CALLED TO, READ BACK BY AND VERIFIED WITH: Toy Baker MD AT 2006 BY AMY RAY RRT RCP ON 04/21/2019    Bicarbonate 21.9 20.0 - 28.0 mmol/L   Acid-base deficit 4.9 (H) 0.0 - 2.0 mmol/L   O2 Saturation 42.7 %   Patient temperature 98.5    Collection site VEIN    Drawn by COLLECTED BY NURSE    Sample type VENOUS     Comment: Performed at [place] [address] [place], [place] [address]  Cortisol     Status: None   Collection Time: 04/21/19  7:59 PM  Result Value Ref Range   Cortisol, Plasma 5.0 ug/dL    Comment: (NOTE) AM    6.7 - 22.6 ug/dL PM   <10.0       ug/dL Performed at [place] [address], [place], [place] [address]   SARS Coronavirus 2 [facility] order, Performed in [facility] hospital lab) Nasopharyngeal Nasopharyngeal Swab     Status: None   Collection Time: 04/21/19  8:18 PM   Specimen: Nasopharyngeal Swab  Result Value  Ref Range   SARS Coronavirus 2 NEGATIVE NEGATIVE    Comment: (NOTE) If result is NEGATIVE SARS-CoV-2 target nucleic acids are NOT DETECTED. The SARS-CoV-2 RNA is generally detectable in upper and lower  respiratory specimens during the acute phase of infection. The lowest  concentration of SARS-CoV-2 viral copies this assay can detect is 250  copies / mL. A negative result does not preclude SARS-CoV-2 infection  and should not be used as the sole basis for treatment or other  patient management decisions.  A negative result may occur with  improper specimen collection / handling, submission of specimen other  than nasopharyngeal swab, presence of viral mutation(s) within the  areas targeted by this assay, and inadequate number of viral copies  (<250 copies / mL). A negative result must be combined with clinical  observations, patient history, and epidemiological information. If result is POSITIVE SARS-CoV-2 target nucleic acids are DETECTED. The SARS-CoV-2 RNA is generally detectable in upper and lower  respiratory specimens dur ing the acute phase of infection.  Positive  results are indicative of active infection with SARS-CoV-2.  Clinical  correlation with patient history and other diagnostic information is  necessary to determine patient infection status.  Positive results do  not rule out bacterial infection or co-infection with other viruses. If result is PRESUMPTIVE POSTIVE SARS-CoV-2 nucleic acids MAY BE PRESENT.   A presumptive positive result was obtained on the submitted specimen  and confirmed on repeat testing.  While 2019 novel coronavirus  (SARS-CoV-2) nucleic acids may be present in the submitted sample  additional confirmatory testing may be necessary for epidemiological  and / or clinical management purposes  to differentiate between  SARS-CoV-2 and other Sarbecovirus currently known to infect humans.  If clinically indicated additional testing with an alternate  test  methodology [phone]) is advised. The SARS-CoV-2 RNA is generally  detectable in upper and lower respiratory sp ecimens during the acute  phase  of infection. The expected result is Negative. Fact Sheet for Patients:  StrictlyIdeas.no Fact Sheet for Healthcare Providers: BankingDealers.co.za This test is not yet approved or cleared by the [place] FDA and has been authorized for detection and/or diagnosis of SARS-CoV-2 by FDA under an Emergency Use Authorization (EUA).  This EUA will remain in effect (meaning this test can be used) for the duration of the COVID-19 declaration under Section 564(b)(1) of the Act, 21 U.S.C. section 360bbb-3(b)(1), unless the authorization is terminated or revoked sooner. Performed at [facility], [place] [address] [place], [place] [address]   CBG monitoring, ED     Status: None   Collection Time: 04/21/19 10:24 PM  Result Value Ref Range   Glucose-Capillary 74 70 - 99 mg/dL  CBG monitoring, ED     Status: Abnormal   Collection Time: 04/22/19  1:45 AM  Result Value Ref Range   Glucose-Capillary 122 (H) 70 - 99 mg/dL  CBG monitoring, ED     Status: Abnormal   Collection Time: 04/22/19  5:01 AM  Result Value Ref Range   Glucose-Capillary 156 (H) 70 - 99 mg/dL   Comment 1 Notify RN   Magnesium     Status: None   Collection Time: 04/22/19  5:40 AM  Result Value Ref Range   Magnesium 2.0 1.7 - 2.4 mg/dL    Comment: Performed at [facility], [place] [address] [place], [place] [address]  Phosphorus     Status: None   Collection Time: 04/22/19  5:40 AM  Result Value Ref Range   Phosphorus 4.2 2.5 - 4.6 mg/dL    Comment: Performed at [facility], [place] [address] [place], [place] [address]  TSH     Status: None   Collection Time: 04/22/19  5:40 AM  Result Value Ref Range   TSH 2.246 0.350 - 4.500 uIU/mL    Comment: Performed by a 3rd  Generation assay with a functional sensitivity of <=0.01 uIU/mL. Performed at [facility], [place] [address], [place], [place] [address]   Comprehensive metabolic panel     Status: Abnormal   Collection Time: 04/22/19  5:40 AM  Result Value Ref Range   Sodium 140 135 - 145 mmol/L   Potassium 5.4 (H) 3.5 - 5.1 mmol/L   Chloride 113 (H) 98 - 111 mmol/L   CO2 22 22 - 32 mmol/L   Glucose, Bld 188 (H) 70 - 99 mg/dL   BUN 34 (H) 8 - 23 mg/dL   Creatinine, Ser 1.90 (H) 0.44 - 1.00 mg/dL   Calcium 8.1 (L) 8.9 - 10.3 mg/dL   Total Protein 7.0 6.5 - 8.1 g/dL   Albumin 3.8 3.5 - 5.0 g/dL   AST 22 15 - 41 U/L   ALT 27 0 - 44 U/L   Alkaline Phosphatase 192 (H) 38 - 126 U/L   Total Bilirubin 0.2 (L) 0.3 - 1.2 mg/dL   GFR calc non Af Amer 25 (L) >60 mL/min   GFR calc Af Amer 29 (L) >60 mL/min   Anion gap 5 5 - 15    Comment: Performed at [facility], [place] [address] [place], [place] [address]  CBC     Status: Abnormal   Collection Time: 04/22/19  5:40 AM  Result Value Ref Range   WBC 10.1 4.0 - 10.5 K/uL   RBC 3.40 (L) 3.87 - 5.11 MIL/uL   Hemoglobin 9.9 (L) 12.0 - 15.0 g/dL   HCT  33.3 (L) 36.0 - 46.0 %   MCV 97.9 80.0 - 100.0 fL   MCH 29.1 26.0 - 34.0 pg   MCHC 29.7 (L) 30.0 - 36.0 g/dL   RDW 16.4 (H) 11.5 - 15.5 %   Platelets 220 150 - 400 K/uL   nRBC 0.0 0.0 - 0.2 %    Comment: Performed at [facility], [place] [address], [place], [place] [address]  CBG monitoring, ED     Status: Abnormal   Collection Time: 04/22/19  8:34 AM  Result Value Ref Range   Glucose-Capillary 146 (H) 70 - 99 mg/dL  CBG monitoring, ED     Status: Abnormal   Collection Time: 04/22/19 11:18 AM  Result Value Ref Range   Glucose-Capillary 141 (H) 70 - 99 mg/dL   No results found.  Pending Labs Unresulted Labs (From admission, onward)    Start     Ordered   04/23/19 0500  CBC  Daily,   R     04/22/19 0642   04/23/19 [id number]  Basic metabolic panel  Daily,    R     04/22/19 0642   04/21/19 2200  Urine Culture  Once,   STAT    Question:  Patient immune status  Answer:  Normal   04/21/19 2159          Vitals/Pain Today's Vitals   04/22/19 1200 04/22/19 1230 04/22/19 1300 04/22/19 1330  BP: (!) 145/95 (!) 148/85 (!) 143/86 (!) 149/77  Pulse: 96 94 99 (!) 101  Resp: 18 20 19 20   Temp:      TempSrc:      SpO2: 97% 96% 97% 97%  Weight:      Height:      PainSc:        Isolation Precautions No active isolations  Medications Medications  aspirin chewable tablet 81 mg (81 mg Oral Given 04/22/19 0913)  HYDROcodone-acetaminophen (NORCO/VICODIN) 5-325 MG per tablet 1 tablet (1 tablet Oral Given 04/22/19 0853)  pravastatin (PRAVACHOL) tablet 10 mg (10 mg Oral Given 04/21/19 2234)  DULoxetine (CYMBALTA) DR capsule 20 mg (20 mg Oral Given 04/22/19 0915)  mirtazapine (REMERON) tablet 7.5 mg (7.5 mg Oral Given 04/21/19 2234)  senna (SENOKOT) tablet 8.6 mg (8.6 mg Oral Given 04/22/19 0914)  tamsulosin (FLOMAX) capsule 0.4 mg (0.4 mg Oral Given 04/22/19 0914)  acetaminophen (TYLENOL) tablet 650 mg (650 mg Oral Given 04/22/19 0140)    Or  acetaminophen (TYLENOL) suppository 650 mg ( Rectal See Alternative 04/22/19 0140)  ondansetron (ZOFRAN) tablet 4 mg ( Oral See Alternative 04/22/19 0535)    Or  ondansetron (ZOFRAN) injection 4 mg (4 mg Intravenous Given 04/22/19 0535)  insulin aspart (novoLOG) injection 0-9 Units (1 Units Subcutaneous Given 04/22/19 1130)  enoxaparin (LOVENOX) injection 30 mg (30 mg Subcutaneous Given 04/21/19 2229)  0.9 %  sodium chloride infusion ( Intravenous Stopped 04/22/19 0854)  sodium bicarbonate tablet 650 mg (650 mg Oral Given 04/22/19 0915)  cefTRIAXone (ROCEPHIN) 1 g in sodium chloride 0.9 % 100 mL IVPB (0 g Intravenous Stopped 04/21/19 2341)  hydrALAZINE (APRESOLINE) injection 10 mg (has no administration in time range)  sodium chloride 0.9 % bolus 500 mL (0 mLs Intravenous Stopped 04/21/19 2141)  sodium zirconium  cyclosilicate (LOKELMA) packet 10 g (10 g Oral Given 04/21/19 2003)  sodium zirconium cyclosilicate (LOKELMA) packet 10 g (10 g Oral Given 04/22/19 0753)    Mobility manual wheelchair

## 2019-04-22 NOTE — Progress Notes (Signed)
[facility]                                                                              Patient Demographics  Stephanie Olson, is a 78 y.o. female, DOB - [birth year], [id number]  Admit date - 04/21/2019   Admitting Physician Toy Baker, MD  Outpatient Primary MD for the patient is Benito Mccreedy, MD  Outpatient specialists:   LOS - 0  days   Medical records reviewed and are as summarized below:    No chief complaint on file.      Brief summary   Patient is 78 year old female with diabetes, PVD with stent in right lower extremity, hypertension, failure to thrive, hyperkalemia, presented with abnormal labs from the nursing facility.  Patient otherwise did not complain of any abdominal pain nausea vomiting or diarrhea. Patient was treated with IV fluids and Lokelma 10 g x 1 Patient was also found to have UTI  Assessment & Plan    Principal Problem:   AKI (acute kidney injury) ([place]) on CKD stage IV, hyperkalemia, non-anion gap acidosis - Likely worsened due to poor oral intake, acute UTI - Continue gentle hydration, IV Rocephin - presented with creatinine of 2.3 with potassium of 6.0, non-anion gap acidosis.  Creatinine 1.4 on 04/27/18 -Creatinine improving to 1.9 -Continue sodium bicarb, Lokelma 10 g x 1 again -Avoid nephrotoxic  Active Problems:     PVD (peripheral vascular disease) (H[facility]) -Currently stable  Acute lower UTI -Follow urine culture and sensitivities, continue IV Rocephin -Blood culture positive for coag negative staph, likely contaminant    Diabetic neuropathy ([place]),  Diabetic ulcer of foot, limited to breakdown of skin ([place]) -Continue wound care    Essential hypertension -BP stable -Place hydralazine IV as needed with parameters    Normocytic anemia likely anemia of chronic disease -Anemia panel consistent with anemia of chronic disease -H&H close to baseline    Hyperkalemia, NAG metabolic acidosis -Likely due to  AKI on CKD stage IV -Presented with potassium of 6.0, improved to 5.4, placed on sodium bicarb -Lokelma 10 g x 1  Diabetes mellitus type 2 -Continue sliding scale insulin, follow hemoglobin A1c  Generalized debility, failure to thrive PT OT evaluation recommended skilled nursing facility  Hyperlipidemia Continue Pravachol  Code Status: Full CODE STATUS DVT Prophylaxis: Lovenox Family Communication: No family at the bedside   Disposition Plan: Remains inpatient, has acute kidney injury with UTI, hyperkalemia follow urine culture and sensitivities.    Time Spent in minutes 35 minutes  Procedures:  None  Consultants:   None  Antimicrobials:   Anti-infectives (From admission, onward)   Start     Dose/Rate Route Frequency Ordered Stop   04/21/19 2200  cefTRIAXone (ROCEPHIN) 1 g in sodium chloride 0.9 % 100 mL IVPB     1 g 200 mL/hr over 30 Minutes Intravenous Every 24 hours 04/21/19 2159           Medications  Scheduled Meds: . aspirin  81 mg Oral Daily  . DULoxetine  20 mg Oral Daily  . enoxaparin (LOVENOX) injection  30 mg Subcutaneous Q24H  . insulin aspart  0-9 Units Subcutaneous  Q4H  . mirtazapine  7.5 mg Oral QHS  . pravastatin  10 mg Oral QHS  . senna  1 tablet Oral BID  . sodium bicarbonate  650 mg Oral BID  . tamsulosin  0.4 mg Oral Daily   Continuous Infusions: . cefTRIAXone (ROCEPHIN)  IV Stopped (04/21/19 2254)   PRN Meds:.acetaminophen **OR** acetaminophen, HYDROcodone-acetaminophen, ondansetron **OR** ondansetron (ZOFRAN) IV      Subjective:   Stephanie Olson was seen and examined today.  Not a good historian, just states does not feel too good today.  Denies any pain anywhere.  No fevers or chills.  No diarrhea nausea or vomiting.  No chest pain.    Objective:   Vitals:   04/22/19 1030 04/22/19 1100 04/22/19 1130 04/22/19 1200  BP: (!) 166/103 (!) 143/97 (!) 148/79 (!) 145/95  Pulse: 97 100 98 96  Resp: 19 18 16 18   Temp:       TempSrc:      SpO2: 98% 98% 97% 97%  Weight:      Height:        Intake/Output Summary (Last 24 hours) at 04/22/2019 1233 Last data filed at 04/22/2019 0854 Gross per 24 hour  Intake 838.87 ml  Output -  Net 838.87 ml     Wt Readings from Last 3 Encounters:  04/21/19 68.9 kg  04/27/18 62.3 kg  11/21/15 85.3 kg     Exam  General: Alert and oriented x 3, NAD, NAD  Eyes: PERRLA, EOMI, Anicteric Sclera,  HEENT:  Atraumatic, normocephalic,   Cardiovascular: S1 S2 auscultated. Regular rate and rhythm.  Respiratory: Clear to auscultation bilaterally, no wheezing, rales or rhonchi  Gastrointestinal: Soft, nontender, nondistended, + bowel sounds  Ext: no pedal edema bilaterally  Neuro: No new deficit  Musculoskeletal: No digital cyanosis, clubbing  Skin: No rashes  Psych: Normal affect and demeanor, alert and oriented x3    Data Reviewed:  I have personally reviewed following labs and imaging studies  Micro Results Recent Results (from the past 240 hour(s))  SARS Coronavirus 2 [facility] order, Performed in [facility] hospital lab) Nasopharyngeal Nasopharyngeal Swab     Status: None   Collection Time: 04/21/19  8:18 PM   Specimen: Nasopharyngeal Swab  Result Value Ref Range Status   SARS Coronavirus 2 NEGATIVE NEGATIVE Final    Comment: (NOTE) If result is NEGATIVE SARS-CoV-2 target nucleic acids are NOT DETECTED. The SARS-CoV-2 RNA is generally detectable in upper and lower  respiratory specimens during the acute phase of infection. The lowest  concentration of SARS-CoV-2 viral copies this assay can detect is 250  copies / mL. A negative result does not preclude SARS-CoV-2 infection  and should not be used as the sole basis for treatment or other  patient management decisions.  A negative result may occur with  improper specimen collection / handling, submission of specimen other  than nasopharyngeal swab, presence of viral mutation(s) within the  areas  targeted by this assay, and inadequate number of viral copies  (<250 copies / mL). A negative result must be combined with clinical  observations, patient history, and epidemiological information. If result is POSITIVE SARS-CoV-2 target nucleic acids are DETECTED. The SARS-CoV-2 RNA is generally detectable in upper and lower  respiratory specimens dur ing the acute phase of infection.  Positive  results are indicative of active infection with SARS-CoV-2.  Clinical  correlation with patient history and other diagnostic information is  necessary to determine patient infection status.  Positive results do  not rule out bacterial infection or co-infection with other viruses. If result is PRESUMPTIVE POSTIVE SARS-CoV-2 nucleic acids MAY BE PRESENT.   A presumptive positive result was obtained on the submitted specimen  and confirmed on repeat testing.  While 2019 novel coronavirus  (SARS-CoV-2) nucleic acids may be present in the submitted sample  additional confirmatory testing may be necessary for epidemiological  and / or clinical management purposes  to differentiate between  SARS-CoV-2 and other Sarbecovirus currently known to infect humans.  If clinically indicated additional testing with an alternate test  methodology [phone]) is advised. The SARS-CoV-2 RNA is generally  detectable in upper and lower respiratory sp ecimens during the acute  phase of infection. The expected result is Negative. Fact Sheet for Patients:  StrictlyIdeas.no Fact Sheet for Healthcare Providers: BankingDealers.co.za This test is not yet approved or cleared by the [place] FDA and has been authorized for detection and/or diagnosis of SARS-CoV-2 by FDA under an Emergency Use Authorization (EUA).  This EUA will remain in effect (meaning this test can be used) for the duration of the COVID-19 declaration under Section 564(b)(1) of the Act, 21 U.S.C. section  360bbb-3(b)(1), unless the authorization is terminated or revoked sooner. Performed at [facility], [place] [address] [place], [place] [address]     Radiology Reports No results found.  Lab Data:  CBC: Recent Labs  Lab 04/21/19 1711 04/21/19 1717 04/22/19 0540  WBC 7.4  --  10.1  NEUTROABS 3.5  --   --   HGB 10.0* 11.6* 9.9*  HCT 34.0* 34.0* 33.3*  MCV 100.0  --  97.9  PLT 216  --  [id number]   Basic Metabolic Panel: Recent Labs  Lab 04/21/19 1711 04/21/19 1717 04/21/19 1944 04/22/19 0540  NA 140 141  --  140  K 6.0* 5.9*  --  5.4*  CL 114* 113*  --  113*  CO2 20*  --   --  22  GLUCOSE 123* 119*  --  188*  BUN 36* 36*  --  34*  CREATININE 2.33* 2.10*  --  1.90*  CALCIUM 8.1*  --   --  8.1*  MG  --   --  2.3 2.0  PHOS  --   --  4.2 4.2   GFR: Estimated Creatinine Clearance: 26.3 mL/min (A) (by C-G formula based on SCr of 1.9 mg/dL (H)). Liver Function Tests: Recent Labs  Lab 04/21/19 1944 04/22/19 0540  AST 28 22  ALT 30 27  ALKPHOS 202* 192*  BILITOT 0.2* 0.2*  PROT 7.2 7.0  ALBUMIN 3.8 3.8   No results for input(s): LIPASE, AMYLASE in the last 168 hours. No results for input(s): AMMONIA in the last 168 hours. Coagulation Profile: No results for input(s): INR, PROTIME in the last 168 hours. Cardiac Enzymes: Recent Labs  Lab 04/21/19 1944  CKTOTAL 82   BNP (last 3 results) No results for input(s): PROBNP in the last 8760 hours. HbA1C: Recent Labs    04/21/19 1711  HGBA1C 5.7*   CBG: Recent Labs  Lab 04/21/19 2224 04/22/19 0145 04/22/19 0501 04/22/19 0834 04/22/19 1118  GLUCAP 74 122* 156* 146* 141*   Lipid Profile: No results for input(s): CHOL, HDL, LDLCALC, TRIG, CHOLHDL, LDLDIRECT in the last 72 hours. Thyroid Function Tests: Recent Labs    04/22/19 0540  TSH 2.246   Anemia Panel: Recent Labs    04/21/19 1944 04/21/19 1945  VITAMINB12  --  608  FOLATE  --  6.4  FERRITIN  --  27  TIBC  --  237*  IRON  --   60  RETICCTPCT 1.1  --    Urine analysis:    Component Value Date/Time   COLORURINE STRAW (A) 04/21/2019 1944   APPEARANCEUR HAZY (A) 04/21/2019 1944   LABSPEC 1.012 04/21/2019 1944   PHURINE 7.0 04/21/2019 1944   GLUCOSEU NEGATIVE 04/21/2019 1944   HGBUR SMALL (A) 04/21/2019 1944   BILIRUBINUR NEGATIVE 04/21/2019 1944   KETONESUR NEGATIVE 04/21/2019 1944   PROTEINUR 30 (A) 04/21/2019 1944   UROBILINOGEN 0.2 03/03/2010 0808   NITRITE POSITIVE (A) 04/21/2019 1944   LEUKOCYTESUR LARGE (A) 04/21/2019 1944     Stephanie Olson M.D. [facility] 04/22/2019, 12:33 PM  Pager: [phone] Between 7am to 7pm - call Pager - [phone]  After 7pm go to [email or web address] - password [email or web address]  Call night coverage person covering after 7pm

## 2019-04-22 NOTE — ED Notes (Signed)
Pt. CBG 156, RN,Kelly made aware.

## 2019-04-23 ENCOUNTER — Inpatient Hospital Stay (HOSPITAL_COMMUNITY): Payer: Medicare Other

## 2019-04-23 DIAGNOSIS — E0841 Diabetes mellitus due to underlying condition with diabetic mononeuropathy: Secondary | ICD-10-CM

## 2019-04-23 DIAGNOSIS — L97401 Non-pressure chronic ulcer of unspecified heel and midfoot limited to breakdown of skin: Secondary | ICD-10-CM

## 2019-04-23 LAB — CBC
HCT: 34.9 % — ABNORMAL LOW (ref 36.0–46.0)
Hemoglobin: 10.4 g/dL — ABNORMAL LOW (ref 12.0–15.0)
MCH: 29.5 pg (ref 26.0–34.0)
MCHC: 29.8 g/dL — ABNORMAL LOW (ref 30.0–36.0)
MCV: 98.9 fL (ref 80.0–100.0)
Platelets: 175 10*3/uL (ref 150–400)
RBC: 3.53 MIL/uL — ABNORMAL LOW (ref 3.87–5.11)
RDW: 15.9 % — ABNORMAL HIGH (ref 11.5–15.5)
WBC: 14.4 10*3/uL — ABNORMAL HIGH (ref 4.0–10.5)
nRBC: 0 % (ref 0.0–0.2)

## 2019-04-23 LAB — BASIC METABOLIC PANEL
Anion gap: 7 (ref 5–15)
BUN: 37 mg/dL — ABNORMAL HIGH (ref 8–23)
CO2: 20 mmol/L — ABNORMAL LOW (ref 22–32)
Calcium: 8 mg/dL — ABNORMAL LOW (ref 8.9–10.3)
Chloride: 113 mmol/L — ABNORMAL HIGH (ref 98–111)
Creatinine, Ser: 2.38 mg/dL — ABNORMAL HIGH (ref 0.44–1.00)
GFR calc Af Amer: 22 mL/min — ABNORMAL LOW (ref >60)
GFR calc non Af Amer: 19 mL/min — ABNORMAL LOW (ref >60)
Glucose, Bld: 141 mg/dL — ABNORMAL HIGH (ref 70–99)
Potassium: 5 mmol/L (ref 3.5–5.1)
Sodium: 140 mmol/L (ref 135–145)

## 2019-04-23 LAB — GLUCOSE, CAPILLARY
Glucose-Capillary: 130 mg/dL — ABNORMAL HIGH (ref 70–99)
Glucose-Capillary: 131 mg/dL — ABNORMAL HIGH (ref 70–99)
Glucose-Capillary: 119 mg/dL — ABNORMAL HIGH (ref 70–99)
Glucose-Capillary: 140 mg/dL — ABNORMAL HIGH (ref 70–99)
Glucose-Capillary: 138 mg/dL — ABNORMAL HIGH (ref 70–99)

## 2019-04-23 MED ORDER — SODIUM CHLORIDE 0.9 % IV SOLN
INTRAVENOUS | Status: DC
  Administered 2019-04-23 – 2019-04-25 (×6): via INTRAVENOUS

## 2019-04-23 MED ORDER — NEPRO/CARBSTEADY PO LIQD
237.0000 mL | ORAL | Status: DC
  Administered 2019-04-24 – 2019-04-26 (×3): 237 mL via ORAL
  Filled 2019-04-23 (×3): qty 237

## 2019-04-23 MED ORDER — PRO-STAT SUGAR FREE PO LIQD
30.0000 mL | Freq: Two times a day (BID) | ORAL | Status: DC
  Administered 2019-04-23 – 2019-04-25 (×4): 30 mL via ORAL
  Filled 2019-04-23 (×5): qty 30

## 2019-04-23 NOTE — Consult Note (Signed)
Reason for Consult: Acute on Chronic Renal Failure, Urinary Retention, Cystitis  Referring Physician: Estill Cotta MD  Stephanie Olson is an 78 y.o. female.   HPI:   1 - Acute on Chronic Renal Failure / Bilateral Hydronephrosis  - recurrent acute renal failure with Cr 2-3 over baseline of around 1.4 usually with urinary retention. Prior episode 2019 and now again 04/2019. She is diabetic on insulin. CT 2019 and again 2020 with bilateral symmetric hydro to bladder w/o stones.   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 Periodic episodes of frank retention with volumes >1585mL.   3 - Cystitis - UCX 8/14 with pseudomonas / pending, placed on empiric rocephin. She is diabetic with poor bladder function. Imaging x several w/o stones.  PMH sig for IDDM2, PAD / chronic wounds, dementia, lives at [facility]  Today "Stephanie Olson" is seen in consultation for above. She normally follows with Dr. Gloriann Loan in our group.   Past Medical History:  Diagnosis Date  . Arthritis   . Diabetes mellitus without complication ([place])   . Diabetic foot ulcers (HCC)    RIGHT   . Hypertension   . Peripheral vascular disease [facility])     Past Surgical History:  Procedure Laterality Date  . ABDOMINAL AORTAGRAM  01/29/2015  . ATHERECTOMY Right 01/29/2015   FEMORAL ARTERY   . BALLOON ANGIOPLASTY, ARTERY Right 01/29/2015   RT FEMORAL   . FOOT SURGERY    . PERIPHERAL VASCULAR CATHETERIZATION N/A 01/29/2015   Procedure: Abdominal Aortogram w/Lower Extremity;  Surgeon: Serafina Mitchell, MD;  Location: [place] [facility];  Service: Cardiovascular;  Laterality: N/A;    Family History  Problem Relation Age of Onset  . Hypertension Mother   . Hypertension Father     Social History:  reports that she has been smoking cigarettes. She has never used smokeless tobacco. She reports that she does not drink alcohol or use drugs.  Allergies: No Known  Allergies  Medications: I have reviewed the patient's current medications.  Results for orders placed or performed during the hospital encounter of 04/21/19 (from the past 48 hour(s))  CBC with Differential/Platelet     Status: Abnormal   Collection Time: 04/21/19  5:11 PM  Result Value Ref Range   WBC 7.4 4.0 - 10.5 K/uL   RBC 3.40 (L) 3.87 - 5.11 MIL/uL   Hemoglobin 10.0 (L) 12.0 - 15.0 g/dL   HCT 34.0 (L) 36.0 - 46.0 %   MCV 100.0 80.0 - 100.0 fL   MCH 29.4 26.0 - 34.0 pg   MCHC 29.4 (L) 30.0 - 36.0 g/dL   RDW 16.5 (H) 11.5 - 15.5 %   Platelets 216 150 - 400 K/uL   nRBC 0.0 0.0 - 0.2 %   Neutrophils Relative % 47 %   Neutro Abs 3.5 1.7 - 7.7 K/uL   Lymphocytes Relative 42 %   Lymphs Abs 3.1 0.7 - 4.0 K/uL   Monocytes Relative 8 %   Monocytes Absolute 0.6 0.1 - 1.0 K/uL   Eosinophils Relative 2 %   Eosinophils Absolute 0.1 0.0 - 0.5 K/uL   Basophils Relative 1 %   Basophils Absolute 0.1 0.0 - 0.1 K/uL   Immature Granulocytes 0 %   Abs Immature Granulocytes 0.02 0.00 - 0.07 K/uL    Comment: Performed at [facility], [place] [address] [place], [place] [id number]  Basic metabolic panel     Status: Abnormal  Collection Time: 04/21/19  5:11 PM  Result Value Ref Range   Sodium 140 135 - 145 mmol/L   Potassium 6.0 (H) 3.5 - 5.1 mmol/L   Chloride 114 (H) 98 - 111 mmol/L   CO2 20 (L) 22 - 32 mmol/L   Glucose, Bld 123 (H) 70 - 99 mg/dL   BUN 36 (H) 8 - 23 mg/dL   Creatinine, Ser 2.33 (H) 0.44 - 1.00 mg/dL   Calcium 8.1 (L) 8.9 - 10.3 mg/dL   GFR calc non Af Amer 20 (L) >60 mL/min   GFR calc Af Amer 23 (L) >60 mL/min   Anion gap 6 5 - 15    Comment: Performed at [facility], [place] [address] [place], [place] [address]  Hemoglobin A1c     Status: Abnormal   Collection Time: 04/21/19  5:11 PM  Result Value Ref Range   Hgb A1c MFr Bld 5.7 (H) 4.8 - 5.6 %    Comment: (NOTE) Pre diabetes:          5.7%-6.4% Diabetes:               >6.4% Glycemic control for   <7.0% adults with diabetes    Mean Plasma Glucose 116.89 mg/dL    Comment: Performed at [place] [address], [place], [place] [address]  I-stat chem 8, ED (not at [facility])     Status: Abnormal   Collection Time: 04/21/19  5:17 PM  Result Value Ref Range   Sodium 141 135 - 145 mmol/L   Potassium 5.9 (H) 3.5 - 5.1 mmol/L   Chloride 113 (H) 98 - 111 mmol/L   BUN 36 (H) 8 - 23 mg/dL   Creatinine, Ser 2.10 (H) 0.44 - 1.00 mg/dL   Glucose, Bld 119 (H) 70 - 99 mg/dL   Calcium, Ion 1.13 (L) 1.15 - 1.40 mmol/L   TCO2 20 (L) 22 - 32 mmol/L   Hemoglobin 11.6 (L) 12.0 - 15.0 g/dL   HCT 34.0 (L) 36.0 - 46.0 %  Urinalysis, Routine w reflex microscopic     Status: Abnormal   Collection Time: 04/21/19  7:44 PM  Result Value Ref Range   Color, Urine STRAW (A) YELLOW   APPearance HAZY (A) CLEAR   Specific Gravity, Urine 1.012 1.005 - 1.030   pH 7.0 5.0 - 8.0   Glucose, UA NEGATIVE NEGATIVE mg/dL   Hgb urine dipstick SMALL (A) NEGATIVE   Bilirubin Urine NEGATIVE NEGATIVE   Ketones, ur NEGATIVE NEGATIVE mg/dL   Protein, ur 30 (A) NEGATIVE mg/dL   Nitrite POSITIVE (A) NEGATIVE   Leukocytes,Ua LARGE (A) NEGATIVE   RBC / HPF 6-10 0 - 5 RBC/hpf   WBC, UA >50 (H) 0 - 5 WBC/hpf   Bacteria, UA MANY (A) NONE SEEN   Squamous Epithelial / LPF 11-20 0 - 5   Non Squamous Epithelial 0-5 (A) NONE SEEN    Comment: Performed at [facility], [place] [address] [place], [place] [address]  Creatinine, urine, random     Status: None   Collection Time: 04/21/19  7:44 PM  Result Value Ref Range   Creatinine, Urine 63.38 mg/dL    Comment: Performed at [facility], [place] [address] [place], [place] [address]  Sodium, urine, random     Status: None   Collection Time: 04/21/19  7:44 PM  Result Value Ref Range   Sodium, Ur 144 mmol/L    Comment: Performed at [facility]  [place] [address] [place], [place] [address]   Magnesium     Status: None   Collection Time: 04/21/19  7:44 PM  Result Value Ref Range   Magnesium 2.3 1.7 - 2.4 mg/dL    Comment: Performed at [facility], [place] [address], [place], [place] [address]  Phosphorus     Status: None   Collection Time: 04/21/19  7:44 PM  Result Value Ref Range   Phosphorus 4.2 2.5 - 4.6 mg/dL    Comment: Performed at [facility], [place] [address] [place], [place] [address]  Hepatic function panel     Status: Abnormal   Collection Time: 04/21/19  7:44 PM  Result Value Ref Range   Total Protein 7.2 6.5 - 8.1 g/dL   Albumin 3.8 3.5 - 5.0 g/dL   AST 28 15 - 41 U/L   ALT 30 0 - 44 U/L   Alkaline Phosphatase 202 (H) 38 - 126 U/L   Total Bilirubin 0.2 (L) 0.3 - 1.2 mg/dL   Bilirubin, Direct 0.1 0.0 - 0.2 mg/dL   Indirect Bilirubin 0.1 (L) 0.3 - 0.9 mg/dL    Comment: Performed at [facility], [place] [address], [place], [place] [address]  CK     Status: None   Collection Time: 04/21/19  7:44 PM  Result Value Ref Range   Total CK 82 38 - 234 U/L    Comment: Performed at [facility], [place] [address] [place] [address] [place], [place] [address]  Reticulocytes     Status: Abnormal   Collection Time: 04/21/19  7:44 PM  Result Value Ref Range   Retic Ct Pct 1.1 0.4 - 3.1 %   RBC. 3.45 (L) 3.87 - 5.11 MIL/uL   Retic Count, Absolute 38.6 19.0 - 186.0 K/uL   Immature Retic Fract 8.5 2.3 - 15.9 %    Comment: Performed at [facility], [place] [address] [place], [place] [address]  Urine Culture     Status: Abnormal (Preliminary result)   Collection Time: 04/21/19  7:44 PM   Specimen: Urine, Clean Catch  Result Value Ref Range   Specimen Description      URINE, CLEAN CATCH Performed at [facility], [place] [address] [place], [place] [address]    Special Requests      Normal Performed at [facility], [place] [address], [place], [place] [address]     Culture 70,000 COLONIES/mL PSEUDOMONAS AERUGINOSA (A)    Report Status PENDING   Vitamin B12     Status: None   Collection Time: 04/21/19  7:45 PM  Result Value Ref Range   Vitamin B-12 608 180 - 914 pg/mL    Comment: (NOTE) This assay is not validated for testing neonatal or myeloproliferative syndrome specimens for Vitamin B12 levels. Performed at [facility], [place] [address], [place],  [address]   Folate     Status: None   Collection Time: 04/21/19  7:45 PM  Result Value Ref Range   Folate 6.4 >5.9 ng/mL    Comment: Performed at [facility], [place] [address] [place], [place] [address]  Iron and TIBC     Status: Abnormal   Collection Time: 04/21/19  7:45 PM  Result Value Ref Range   Iron 60 28 - 170 ug/dL   TIBC 237 (L) 250 - 450 ug/dL   Saturation Ratios 25 10.4 - 31.8 %   UIBC 177 ug/dL  Comment: Performed at [facility], [place] [address] [place], [place] [address]  Ferritin     Status: None   Collection Time: 04/21/19  7:45 PM  Result Value Ref Range   Ferritin 27 11 - 307 ng/mL    Comment: Performed at [facility], [place] [address], [place], [place] [address]  Blood gas, venous     Status: Abnormal   Collection Time: 04/21/19  7:59 PM  Result Value Ref Range   FIO2 21.00    pH, Ven 7.248 (L) 7.250 - 7.430   pCO2, Ven 51.9 44.0 - 60.0 mmHg   pO2, Ven BELOW REPORTABLE RANGE 32.0 - 45.0 mmHg    Comment: CRITICAL RESULT CALLED TO, READ BACK BY AND VERIFIED WITH: Toy Baker MD AT 2006 BY AMY RAY RRT RCP ON 04/21/2019    Bicarbonate 21.9 20.0 - 28.0 mmol/L   Acid-base deficit 4.9 (H) 0.0 - 2.0 mmol/L   O2 Saturation 42.7 %   Patient temperature 98.5    Collection site VEIN    Drawn by COLLECTED BY NURSE    Sample type VENOUS     Comment: Performed at [place] [address], [place], [place] [address]  Cortisol     Status: None   Collection Time: 04/21/19   7:59 PM  Result Value Ref Range   Cortisol, Plasma 5.0 ug/dL    Comment: (NOTE) AM    6.7 - 22.6 ug/dL PM   <10.0       ug/dL Performed at [place] [address], [place], [place] [address]   SARS Coronavirus 2 [facility] order, Performed in [facility] hospital lab) Nasopharyngeal Nasopharyngeal Swab     Status: None   Collection Time: 04/21/19  8:18 PM   Specimen: Nasopharyngeal Swab  Result Value Ref Range   SARS Coronavirus 2 NEGATIVE NEGATIVE    Comment: (NOTE) If result is NEGATIVE SARS-CoV-2 target nucleic acids are NOT DETECTED. The SARS-CoV-2 RNA is generally detectable in upper and lower  respiratory specimens during the acute phase of infection. The lowest  concentration of SARS-CoV-2 viral copies this assay can detect is 250  copies / mL. A negative result does not preclude SARS-CoV-2 infection  and should not be used as the sole basis for treatment or other  patient management decisions.  A negative result may occur with  improper specimen collection / handling, submission of specimen other  than nasopharyngeal swab, presence of viral mutation(s) within the  areas targeted by this assay, and inadequate number of viral copies  (<250 copies / mL). A negative result must be combined with clinical  observations, patient history, and epidemiological information. If result is POSITIVE SARS-CoV-2 target nucleic acids are DETECTED. The SARS-CoV-2 RNA is generally detectable in upper and lower  respiratory specimens dur ing the acute phase of infection.  Positive  results are indicative of active infection with SARS-CoV-2.  Clinical  correlation with patient history and other diagnostic information is  necessary to determine patient infection status.  Positive results do  not rule out bacterial infection or co-infection with other viruses. If result is PRESUMPTIVE POSTIVE SARS-CoV-2 nucleic acids MAY BE PRESENT.   A presumptive positive result was obtained on  the submitted specimen  and confirmed on repeat testing.  While 2019 novel coronavirus  (SARS-CoV-2) nucleic acids may be present in the submitted sample  additional confirmatory testing may be necessary for epidemiological  and / or clinical management purposes  to  differentiate between  SARS-CoV-2 and other Sarbecovirus currently known to infect humans.  If clinically indicated additional testing with an alternate test  methodology [phone]) is advised. The SARS-CoV-2 RNA is generally  detectable in upper and lower respiratory sp ecimens during the acute  phase of infection. The expected result is Negative. Fact Sheet for Patients:  StrictlyIdeas.no Fact Sheet for Healthcare Providers: BankingDealers.co.za This test is not yet approved or cleared by the [place] FDA and has been authorized for detection and/or diagnosis of SARS-CoV-2 by FDA under an Emergency Use Authorization (EUA).  This EUA will remain in effect (meaning this test can be used) for the duration of the COVID-19 declaration under Section 564(b)(1) of the Act, 21 U.S.C. section 360bbb-3(b)(1), unless the authorization is terminated or revoked sooner. Performed at [facility], [place] [address], [place], [place] [address]   CBG monitoring, ED     Status: None   Collection Time: 04/21/19 10:24 PM  Result Value Ref Range   Glucose-Capillary 74 70 - 99 mg/dL  CBG monitoring, ED     Status: Abnormal   Collection Time: 04/22/19  1:45 AM  Result Value Ref Range   Glucose-Capillary 122 (H) 70 - 99 mg/dL  CBG monitoring, ED     Status: Abnormal   Collection Time: 04/22/19  5:01 AM  Result Value Ref Range   Glucose-Capillary 156 (H) 70 - 99 mg/dL   Comment 1 Notify RN   Magnesium     Status: None   Collection Time: 04/22/19  5:40 AM  Result Value Ref Range   Magnesium 2.0 1.7 - 2.4 mg/dL    Comment: Performed at [facility], [place] [address] [place], [place] [address]  Phosphorus     Status: None   Collection Time: 04/22/19  5:40 AM  Result Value Ref Range   Phosphorus 4.2 2.5 - 4.6 mg/dL    Comment: Performed at [facility], [place] [address] [place], [place] [address]  TSH     Status: None   Collection Time: 04/22/19  5:40 AM  Result Value Ref Range   TSH 2.246 0.350 - 4.500 uIU/mL    Comment: Performed by a 3rd Generation assay with a functional sensitivity of <=0.01 uIU/mL. Performed at [facility], [place] [address], [place], [place] [address]   Comprehensive metabolic panel     Status: Abnormal   Collection Time: 04/22/19  5:40 AM  Result Value Ref Range   Sodium 140 135 - 145 mmol/L   Potassium 5.4 (H) 3.5 - 5.1 mmol/L   Chloride 113 (H) 98 - 111 mmol/L   CO2 22 22 - 32 mmol/L   Glucose, Bld 188 (H) 70 - 99 mg/dL   BUN 34 (H) 8 - 23 mg/dL   Creatinine, Ser 1.90 (H) 0.44 - 1.00 mg/dL   Calcium 8.1 (L) 8.9 - 10.3 mg/dL   Total Protein 7.0 6.5 - 8.1 g/dL   Albumin 3.8 3.5 - 5.0 g/dL   AST 22 15 - 41 U/L   ALT 27 0 - 44 U/L   Alkaline Phosphatase 192 (H) 38 - 126 U/L   Total Bilirubin 0.2 (L) 0.3 - 1.2 mg/dL   GFR calc non Af Amer 25 (L) >60 mL/min   GFR calc Af Amer 29 (L) >60 mL/min   Anion gap 5 5 - 15    Comment: Performed at [facility], [place] [address] [place], [place] [address]  CBC  Status: Abnormal   Collection Time: 04/22/19  5:40 AM  Result Value Ref Range   WBC 10.1 4.0 - 10.5 K/uL   RBC 3.40 (L) 3.87 - 5.11 MIL/uL   Hemoglobin 9.9 (L) 12.0 - 15.0 g/dL   HCT 33.3 (L) 36.0 - 46.0 %   MCV 97.9 80.0 - 100.0 fL   MCH 29.1 26.0 - 34.0 pg   MCHC 29.7 (L) 30.0 - 36.0 g/dL   RDW 16.4 (H) 11.5 - 15.5 %   Platelets 220 150 - 400 K/uL   nRBC 0.0 0.0 - 0.2 %    Comment: Performed at [facility], [place] [address] [place], [place] [address]  CBG monitoring, ED     Status: Abnormal   Collection Time: 04/22/19  8:34 AM   Result Value Ref Range   Glucose-Capillary 146 (H) 70 - 99 mg/dL  CBG monitoring, ED     Status: Abnormal   Collection Time: 04/22/19 11:18 AM  Result Value Ref Range   Glucose-Capillary 141 (H) 70 - 99 mg/dL  Glucose, capillary     Status: Abnormal   Collection Time: 04/22/19  4:44 PM  Result Value Ref Range   Glucose-Capillary 144 (H) 70 - 99 mg/dL  Glucose, capillary     Status: Abnormal   Collection Time: 04/22/19  8:13 PM  Result Value Ref Range   Glucose-Capillary 150 (H) 70 - 99 mg/dL   Comment 1 Notify RN    Comment 2 Document in Chart   Glucose, capillary     Status: Abnormal   Collection Time: 04/22/19 11:39 PM  Result Value Ref Range   Glucose-Capillary 110 (H) 70 - 99 mg/dL   Comment 1 Notify RN    Comment 2 Document in Chart   CBC     Status: Abnormal   Collection Time: 04/23/19  2:24 AM  Result Value Ref Range   WBC 14.4 (H) 4.0 - 10.5 K/uL   RBC 3.53 (L) 3.87 - 5.11 MIL/uL   Hemoglobin 10.4 (L) 12.0 - 15.0 g/dL   HCT 34.9 (L) 36.0 - 46.0 %   MCV 98.9 80.0 - 100.0 fL   MCH 29.5 26.0 - 34.0 pg   MCHC 29.8 (L) 30.0 - 36.0 g/dL   RDW 15.9 (H) 11.5 - 15.5 %   Platelets 175 150 - 400 K/uL   nRBC 0.0 0.0 - 0.2 %    Comment: Performed at [facility], [place] [address], [place], [place] [id number]  Basic metabolic panel     Status: Abnormal   Collection Time: 04/23/19  2:24 AM  Result Value Ref Range   Sodium 140 135 - 145 mmol/L   Potassium 5.0 3.5 - 5.1 mmol/L   Chloride 113 (H) 98 - 111 mmol/L   CO2 20 (L) 22 - 32 mmol/L   Glucose, Bld 141 (H) 70 - 99 mg/dL   BUN 37 (H) 8 - 23 mg/dL   Creatinine, Ser 2.38 (H) 0.44 - 1.00 mg/dL   Calcium 8.0 (L) 8.9 - 10.3 mg/dL   GFR calc non Af Amer 19 (L) >60 mL/min   GFR calc Af Amer 22 (L) >60 mL/min   Anion gap 7 5 - 15    Comment: Performed at [facility], [place] [address] [place], [place] [address]  Glucose, capillary     Status: Abnormal   Collection Time: 04/23/19  3:49 AM   Result Value Ref Range   Glucose-Capillary 130 (H) 70 - 99 mg/dL  Comment 1 Notify RN    Comment 2 Document in Chart   Glucose, capillary     Status: Abnormal   Collection Time: 04/23/19  8:37 AM  Result Value Ref Range   Glucose-Capillary 131 (H) 70 - 99 mg/dL  Glucose, capillary     Status: Abnormal   Collection Time: 04/23/19 11:48 AM  Result Value Ref Range   Glucose-Capillary 119 (H) 70 - 99 mg/dL    US Renal  Result Date: 04/23/2019 CLINICAL DATA:  Acute kidney injury.  Hydronephrosis. EXAM: RENAL / URINARY TRACT ULTRASOUND COMPLETE COMPARISON:  04/22/2018 FINDINGS: Right Kidney: Renal measurements: 9.5 x 5.1 x 4.4 cm = volume: 114 mL. No renal masses identified. Moderate to severe right hydronephrosis is seen which is increased since previous study. Left Kidney: Renal measurements: 8.0 x 3.6 x 4.5 cm = volume: 67 mL. No renal masses identified. Mild-to-moderate left hydronephrosis is seen, which is increased since previous study. Bladder: Urinary bladder is distended but otherwise unremarkable in appearance. IMPRESSION: Bilateral hydronephrosis which is increased since prior exam. Distended urinary bladder. Recommend clinical correlation for possible urinary retention. Electronically Signed   By: Marlaine Hind M.D.   On: 04/23/2019 10:04    Review of Systems  Constitutional: Negative for fever.  HENT: Positive for hearing loss.   Genitourinary: Negative for flank pain.  All other systems reviewed and are negative.  Blood pressure 122/75, pulse (!) 104, temperature 98.9 F (37.2 C), temperature source Oral, resp. rate 18, height 5\' 9"  (1.753 m), weight 71.8 kg, SpO2 96 %. Physical Exam  Constitutional:  Very hard of hearing and moderate dementia.   HENT:  Head: Normocephalic.  Eyes: Pupils are equal, round, and reactive to light.  Wearing glasses  Neck: Normal range of motion.  Cardiovascular:  Regular tachycardia  Respiratory: Effort normal.  GI: Soft.  Genitourinary:     Genitourinary Comments: No CVAT. Foley in place with non-foul urine.    Musculoskeletal:     Comments: LE wasting and RLE wound boot.   Neurological:  AOx1  Skin: Skin is warm.  Psychiatric: She has a normal mood and affect.    Assessment/Plan:  1 - Acute on Chronic Renal Failure - likely due to recurrent retention. Keep current foley at discharge.   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 Reinforced need for glycemic control. Keep current foley at discharge and f/u Dr. Gloriann Loan in our office in abotu 2 weeks, I have requested this.   3 - Cystitis - agree with current ABX pending final C/S data. Her poor emptying and diabetes are predisposing factors.   We will follow pt PRN at this point. Please call anytime with questions.    Alexis Frock 04/23/2019, 1:34 PM

## 2019-04-23 NOTE — Progress Notes (Signed)
Initial Nutrition Assessment  INTERVENTION:   -Provide Nepro Shake po daily, each supplement provides 425 kcal and 19 grams protein -Provide Prostat liquid protein PO 30 ml BID with meals, each supplement provides 100 kcal, 15 grams protein.  NUTRITION DIAGNOSIS:   Increased nutrient needs related to chronic illness as evidenced by estimated needs.  GOAL:   Patient will meet greater than or equal to 90% of their needs  MONITOR:   PO intake, Supplement acceptance, Labs, Weight trends, I & O's, Skin  REASON FOR ASSESSMENT:   Consult Assessment of nutrition requirement/status  ASSESSMENT:   78 year old female with diabetes, PVD with stent in right lower extremity, hypertension, failure to thrive, hyperkalemia, presented with abnormal labs from the nursing facility. Admitted for AKI.  **RD working remotely**  Patient currently alert/oriented x 2. Patient has had poor PO intakes PTA and this has continued this admission. Pt consumed 25% of lunch today. Will order Prostat supplements and Nepro supplements given elevated K and worsening renal function.   Per weight records, pt's weight has increased since admission 1 year ago.   Medications: Remeron tablet daily, IV Zofran PRN Labs reviewed:  CBGs: 119-131 GFR: 22  NUTRITION - FOCUSED PHYSICAL EXAM:  Unable to perform-working remotely.  Diet Order:   Diet Order            Diet renal with fluid restriction Fluid restriction: 1800 mL Fluid; Room service appropriate? Yes; Fluid consistency: Thin  Diet effective now              EDUCATION NEEDS:   No education needs have been identified at this time  Skin:  Skin Assessment: Skin Integrity Issues: Skin Integrity Issues:: DTI DTI: right foot  Last BM:  8/16  Height:   Ht Readings from Last 1 Encounters:  04/22/19 5\' 9"  (1.753 m)    Weight:   Wt Readings from Last 1 Encounters:  04/22/19 71.8 kg    Ideal Body Weight:  65.9 kg  BMI:  Body mass index is  23.38 kg/m.  Estimated Nutritional Needs:   Kcal:  1700-1900  Protein:  75-85g  Fluid:  1.7L/day   Clayton Bibles, MS, RD, LDN [place] Dietitian Pager: [phone] After Hours Pager: [phone]

## 2019-04-23 NOTE — Progress Notes (Addendum)
[facility]                                                                              Patient Demographics  Stephanie Olson, is a 78 y.o. female, DOB - 03-09-1941, [id number]  Admit date - 04/21/2019   Admitting Physician Toy Baker, MD  Outpatient Primary MD for the patient is Benito Mccreedy, MD  Outpatient specialists:   LOS - 1  days   Medical records reviewed and are as summarized below:    No chief complaint on file.      Brief summary   Patient is 78 year old female with diabetes, PVD with stent in right lower extremity, hypertension, failure to thrive, hyperkalemia, presented with abnormal labs from the nursing facility.  Patient otherwise did not complain of any abdominal pain nausea vomiting or diarrhea. Patient was treated with IV fluids and Lokelma 10 g x 1 Patient was also found to have UTI BMET showed creatinine of 2.3, potassium 6.0 with non-anion gap acidosis.  Patient was admitted for further work-up.  Assessment & Plan    Principal Problem:   AKI (acute kidney injury) ([place]) on CKD stage IV, hyperkalemia, non-anion gap acidosis - Presented with creatinine of 2.3, potassium 6.0, non-anion gap acidosis.  No uremia, making urine - Likely worsened due to poor oral intake, acute UTI, worsening of CKD stage IV - Patient was placed on IV fluid hydration, initial improvement to 1.9 yesterday, then worsened again to 2.3 today.  IV fluids were placed on hold last night. - Obtain renal ultrasound, placed back on IV fluids.   - Nephrology consulted, discussed with Dr. Grayland Ormond - Avoid nephrotoxic agents Addendum: 12:05 PM Renal ultrasound shows bilateral hydronephrosis which is increased since prior exam, distended urinary bladder -Discussed with Dr. Johnney Ou and Dr. Tresa Moore (urology), will place Foley catheter, CT abdomen pelvis without contrast (per urology recs). Updated family (son).  Active Problems:     PVD (peripheral vascular  disease) (HCC) -Currently stable  Acute lower UTI gram-negative rods -Follow sensitivities, growing gram-negative rods, continue IV Rocephin  -Blood culture 1/2 positive for coag negative staph, likely contaminant    Diabetic neuropathy ([place]),  Diabetic ulcer of foot, limited to breakdown of skin (HCC) -Continue wound care    Essential hypertension - BP stable -Place hydralazine IV as needed with parameters    Normocytic anemia likely anemia of chronic disease -Anemia panel consistent with anemia of chronic disease -H&H close to baseline    Hyperkalemia, NAG metabolic acidosis -Likely due to AKI on CKD stage IV, presented with K 6.0 -Patient received Lokelma 10 g x 2 yesterday, continue sodium bicarb -K improving, 5.0 today  Diabetes mellitus type 2 -Hemoglobin A1c 5.7, continue sliding scale insulin  Generalized debility, failure to thrive PT OT evaluation recommended skilled nursing facility  Hyperlipidemia Continue Pravachol  Code Status: Full CODE STATUS DVT Prophylaxis: Lovenox Family Communication: called and updated labs, imaging and management to patient's son Daine Gravel   Disposition Plan: Remains inpatient, creatinine function worsening, needs nephrology consult, follow urine culture and sensitivities.   Time Spent in minutes 25 minutes  Procedures:  None  Consultants:  Nephrology  Antimicrobials:   Anti-infectives (From admission, onward)   Start     Dose/Rate Route Frequency Ordered Stop   04/21/19 2200  cefTRIAXone (ROCEPHIN) 1 g in sodium chloride 0.9 % 100 mL IVPB     1 g 200 mL/hr over 30 Minutes Intravenous Every 24 hours 04/21/19 2159           Medications  Scheduled Meds: . aspirin  81 mg Oral Daily  . DULoxetine  20 mg Oral Daily  . enoxaparin (LOVENOX) injection  30 mg Subcutaneous Q24H  . insulin aspart  0-9 Units Subcutaneous Q4H  . mirtazapine  7.5 mg Oral QHS  . pravastatin  10 mg Oral QHS  . senna  1 tablet Oral BID   . sodium bicarbonate  650 mg Oral BID  . tamsulosin  0.4 mg Oral Daily   Continuous Infusions: . sodium chloride 75 mL/hr at 04/23/19 0944  . cefTRIAXone (ROCEPHIN)  IV 1 g (04/22/19 2201)   PRN Meds:.acetaminophen **OR** acetaminophen, hydrALAZINE, HYDROcodone-acetaminophen, ondansetron **OR** ondansetron (ZOFRAN) IV      Subjective:   Navie Salvesen was seen and examined today.  Complaining of right foot pain otherwise no acute complaints.  No fevers or chills, no diarrhea, eating and tolerating diet.  Making urine.  No chest pain, dizziness, shortness of breath, nausea or vomiting.   Objective:   Vitals:   04/22/19 1839 04/22/19 2003 04/23/19 0137 04/23/19 0538  BP: (!) 144/90 (!) 146/89 (!) 142/84 122/75  Pulse: (!) 104 (!) 107 (!) 110 (!) 104  Resp: 16 16 18 18   Temp: 99 F (37.2 C) 99.1 F (37.3 C) 99.7 F (37.6 C) 98.9 F (37.2 C)  TempSrc: Oral Oral Oral Oral  SpO2: 95% 94% 96% 96%  Weight:      Height:        Intake/Output Summary (Last 24 hours) at 04/23/2019 1034 Last data filed at 04/23/2019 0113 Gross per 24 hour  Intake -  Output 150 ml  Net -150 ml     Wt Readings from Last 3 Encounters:  04/22/19 71.8 kg  04/27/18 62.3 kg  11/21/15 85.3 kg   Physical Exam  General: Alert and oriented x 3, NAD  Eyes:   HEENT:    Cardiovascular: S1 S2 clear, RRR.   Respiratory: CTAB, no wheezing, rales or rhonchi  Gastrointestinal: Soft, nontender, nondistended, NBS  Ext: Right lower extremity in heel protector  Neuro: no new deficits  Musculoskeletal: No cyanosis, clubbing  Skin: No rashes  Psych: Normal affect and demeanor, alert and oriented x3    Data Reviewed:  I have personally reviewed following labs and imaging studies  Micro Results Recent Results (from the past 240 hour(s))  Urine Culture     Status: Abnormal (Preliminary result)   Collection Time: 04/21/19  7:44 PM   Specimen: Urine, Clean Catch  Result Value Ref Range Status    Specimen Description   Final    URINE, CLEAN CATCH Performed at [facility] [address] [place], [place] [address]    Special Requests   Final    Normal Performed at [facility], [place] [address], [place], [place] [address]    Culture 70,000 COLONIES/mL GRAM NEGATIVE RODS (A)  Final   Report Status PENDING  Incomplete  SARS Coronavirus 2 [facility] order, Performed in [facility] hospital lab) Nasopharyngeal Nasopharyngeal Swab     Status: None   Collection Time: 04/21/19  8:18 PM   Specimen: Nasopharyngeal Swab  Result Value Ref Range Status   SARS Coronavirus 2 NEGATIVE NEGATIVE Final    Comment: (NOTE) If result is NEGATIVE SARS-CoV-2 target nucleic acids are NOT DETECTED. The SARS-CoV-2 RNA is generally detectable in upper and lower  respiratory specimens during the acute phase of infection. The lowest  concentration of SARS-CoV-2 viral copies this assay can detect is 250  copies / mL. A negative result does not preclude SARS-CoV-2 infection  and should not be used as the sole basis for treatment or other  patient management decisions.  A negative result may occur with  improper specimen collection / handling, submission of specimen other  than nasopharyngeal swab, presence of viral mutation(s) within the  areas targeted by this assay, and inadequate number of viral copies  (<250 copies / mL). A negative result must be combined with clinical  observations, patient history, and epidemiological information. If result is POSITIVE SARS-CoV-2 target nucleic acids are DETECTED. The SARS-CoV-2 RNA is generally detectable in upper and lower  respiratory specimens dur ing the acute phase of infection.  Positive  results are indicative of active infection with SARS-CoV-2.  Clinical  correlation with patient history and other diagnostic information is  necessary to determine patient infection status.  Positive results do  not rule out bacterial  infection or co-infection with other viruses. If result is PRESUMPTIVE POSTIVE SARS-CoV-2 nucleic acids MAY BE PRESENT.   A presumptive positive result was obtained on the submitted specimen  and confirmed on repeat testing.  While 2019 novel coronavirus  (SARS-CoV-2) nucleic acids may be present in the submitted sample  additional confirmatory testing may be necessary for epidemiological  and / or clinical management purposes  to differentiate between  SARS-CoV-2 and other Sarbecovirus currently known to infect humans.  If clinically indicated additional testing with an alternate test  methodology [phone]) is advised. The SARS-CoV-2 RNA is generally  detectable in upper and lower respiratory sp ecimens during the acute  phase of infection. The expected result is Negative. Fact Sheet for Patients:  StrictlyIdeas.no Fact Sheet for Healthcare Providers: BankingDealers.co.za This test is not yet approved or cleared by the [place] FDA and has been authorized for detection and/or diagnosis of SARS-CoV-2 by FDA under an Emergency Use Authorization (EUA).  This EUA will remain in effect (meaning this test can be used) for the duration of the COVID-19 declaration under Section 564(b)(1) of the Act, 21 U.S.C. section 360bbb-3(b)(1), unless the authorization is terminated or revoked sooner. Performed at [facility], [place] [address], [place], [place] [address]     Radiology Reports US Renal  Result Date: 04/23/2019 CLINICAL DATA:  Acute kidney injury.  Hydronephrosis. EXAM: RENAL / URINARY TRACT ULTRASOUND COMPLETE COMPARISON:  04/22/2018 FINDINGS: Right Kidney: Renal measurements: 9.5 x 5.1 x 4.4 cm = volume: 114 mL. No renal masses identified. Moderate to severe right hydronephrosis is seen which is increased since previous study. Left Kidney: Renal measurements: 8.0 x 3.6 x 4.5 cm = volume: 67 mL. No renal masses  identified. Mild-to-moderate left hydronephrosis is seen, which is increased since previous study. Bladder: Urinary bladder is distended but otherwise unremarkable in appearance. IMPRESSION: Bilateral hydronephrosis which is increased since prior exam. Distended urinary bladder. Recommend clinical correlation for possible urinary retention. Electronically Signed   By: Marlaine Hind M.D.   On: 04/23/2019 10:04    Lab Data:  CBC: Recent Labs  Lab 04/21/19 1711 04/21/19 1717 04/22/19 0540 04/23/19 0224  WBC 7.4  --  10.1 14.4*  NEUTROABS 3.5  --   --   --   HGB 10.0* 11.6* 9.9* 10.4*  HCT 34.0* 34.0* 33.3* 34.9*  MCV 100.0  --  97.9 98.9  PLT 216  --  220 0000000   Basic Metabolic Panel: Recent Labs  Lab 04/21/19 1711 04/21/19 1717 04/21/19 1944 04/22/19 0540 04/23/19 0224  NA 140 141  --  140 140  K 6.0* 5.9*  --  5.4* 5.0  CL 114* 113*  --  113* 113*  CO2 20*  --   --  22 20*  GLUCOSE 123* 119*  --  188* 141*  BUN 36* 36*  --  34* 37*  CREATININE 2.33* 2.10*  --  1.90* 2.38*  CALCIUM 8.1*  --   --  8.1* 8.0*  MG  --   --  2.3 2.0  --   PHOS  --   --  4.2 4.2  --    GFR: Estimated Creatinine Clearance: 21 mL/min (A) (by C-G formula based on SCr of 2.38 mg/dL (H)). Liver Function Tests: Recent Labs  Lab 04/21/19 1944 04/22/19 0540  AST 28 22  ALT 30 27  ALKPHOS 202* 192*  BILITOT 0.2* 0.2*  PROT 7.2 7.0  ALBUMIN 3.8 3.8   No results for input(s): LIPASE, AMYLASE in the last 168 hours. No results for input(s): AMMONIA in the last 168 hours. Coagulation Profile: No results for input(s): INR, PROTIME in the last 168 hours. Cardiac Enzymes: Recent Labs  Lab 04/21/19 1944  CKTOTAL 82   BNP (last 3 results) No results for input(s): PROBNP in the last 8760 hours. HbA1C: Recent Labs    04/21/19 1711  HGBA1C 5.7*   CBG: Recent Labs  Lab 04/22/19 1644 04/22/19 2013 04/22/19 2339 04/23/19 0349 04/23/19 0837  GLUCAP 144* 150* 110* 130* 131*   Lipid Profile:  No results for input(s): CHOL, HDL, LDLCALC, TRIG, CHOLHDL, LDLDIRECT in the last 72 hours. Thyroid Function Tests: Recent Labs    04/22/19 0540  TSH 2.246   Anemia Panel: Recent Labs    04/21/19 1944 04/21/19 1945  VITAMINB12  --  608  FOLATE  --  6.4  FERRITIN  --  27  TIBC  --  237*  IRON  --  60  RETICCTPCT 1.1  --    Urine analysis:    Component Value Date/Time   COLORURINE STRAW (A) 04/21/2019 1944   APPEARANCEUR HAZY (A) 04/21/2019 1944   LABSPEC 1.012 04/21/2019 1944   PHURINE 7.0 04/21/2019 1944   GLUCOSEU NEGATIVE 04/21/2019 1944   HGBUR SMALL (A) 04/21/2019 1944   BILIRUBINUR NEGATIVE 04/21/2019 1944   KETONESUR NEGATIVE 04/21/2019 1944   PROTEINUR 30 (A) 04/21/2019 1944   UROBILINOGEN 0.2 03/03/2010 0808   NITRITE POSITIVE (A) 04/21/2019 1944   LEUKOCYTESUR LARGE (A) 04/21/2019 1944     Ripudeep Rai M.D. [facility] 04/23/2019, 10:34 AM  Pager: [phone] Between 7am to 7pm - call Pager - [phone]  After 7pm go to [email or web address] - password [email or web address]  Call night coverage person covering after 7pm

## 2019-04-23 NOTE — Consult Note (Signed)
[facility]  INPATIENT CONSULTATION  Reason for Consultation: AKI  Requesting Provider: Dr. Tana Coast  HPI: Stephanie Olson is an 78 y.o. female HTN, DM, PAD who is currently admitted from SNF after labs there showed K 6.9.  Nephrology is consulted for evaluation and management of AKI.   Pt presented from [place] on 8/14 after K reported 6.9 on labs there. Here on presentation K 6, Bicarb 20, BUN 36, Cr 2.33.  She was treated with NS MIVF, lokelma 10g x2 and started on na bicarb 650 BID po.   She has no particular complaints today but is a limited historian which appears mainly due to quite severe hearing impairment.   UOP yesterday documented as 175mL.  8/14 UA +LE and nit, +blood, +WBC.  Being treated with CTX for UTI UNa 144, U cr 63 - FeNA 3.2% 8/16 renal US showing distended bladder and worsen hydronephrosis c/w 04/2018 study.   She was admitted in 04/2018 from SNF to [facility] for weakness and fatigue.  She was found to have AKI Cr 8.1 with hydronephrosis on imaging which improved with foley placement.  She was hydrated as well.  Cr 1.44 at discharge.  She was also treated for possible aspiration PNA.  [facility]. She cannot tell me what the outcome was of that.   PMH: Past Medical History:  Diagnosis Date  . Arthritis   . Diabetes mellitus without complication ([place])   . Diabetic foot ulcers (HCC)    RIGHT   . Hypertension   . Peripheral vascular disease (HCC)    PSH: Past Surgical History:  Procedure Laterality Date  . ABDOMINAL AORTAGRAM  01/29/2015  . ATHERECTOMY Right 01/29/2015   FEMORAL ARTERY   . BALLOON ANGIOPLASTY, ARTERY Right 01/29/2015   RT FEMORAL   . FOOT SURGERY    . PERIPHERAL VASCULAR CATHETERIZATION N/A 01/29/2015   Procedure: Abdominal Aortogram w/Lower Extremity;  Surgeon: Serafina Mitchell, MD;  Location: [facility];  Service: Cardiovascular;  Laterality: N/A;    Past Medical History:  Diagnosis Date  .  Arthritis   . Diabetes mellitus without complication ([place])   . Diabetic foot ulcers (HCC)    RIGHT   . Hypertension   . Peripheral vascular disease ([place])     Medications:  I have reviewed the patient's current medications.  Medications Prior to Admission  Medication Sig Dispense Refill  . acetaminophen (TYLENOL) 325 MG tablet Take 650 mg by mouth every 8 (eight) hours as needed for moderate pain or headache.    Marland Kitchen acetaminophen (TYLENOL) 500 MG tablet Take 500 mg by mouth 2 (two) times daily.     Marland Kitchen aspirin 81 MG chewable tablet Chew 81 mg by mouth daily.    Marland Kitchen dicyclomine (BENTYL) 10 MG capsule Take 10 mg by mouth 2 (two) times daily.    . DULoxetine (CYMBALTA) 20 MG capsule Take 20 mg by mouth daily.    Marland Kitchen HYDROcodone-acetaminophen (NORCO/VICODIN) 5-325 MG tablet Take 1 tablet by mouth every 6 (six) hours as needed for moderate pain. 12 tablet 0  . Melatonin 3 MG TABS Take 3 mg by mouth at bedtime.     . mirtazapine (REMERON) 7.5 MG tablet Take 7.5 mg by mouth at bedtime.    . polyvinyl alcohol (LIQUIFILM TEARS) 1.4 % ophthalmic solution Place 1 drop into both eyes 2 (two) times daily.    . pravastatin (PRAVACHOL) 20 MG tablet Take 10 mg by mouth at bedtime.    Marland Kitchen  senna (SENOKOT) 8.6 MG TABS tablet Take 1 tablet by mouth 2 (two) times daily.    . SPS 15 GM/60ML suspension Take 60 mLs by mouth daily.    . tamsulosin (FLOMAX) 0.4 MG CAPS capsule Take 0.4 mg by mouth daily.    Marland Kitchen trolamine salicylate (ASPERCREME) 10 % cream Apply 1 application topically daily.    . bisacodyl (DULCOLAX) 10 MG suppository Place 1 suppository (10 mg total) rectally daily as needed for moderate constipation. (Patient not taking: Reported on 04/21/2019) 12 suppository 0  . loperamide (IMODIUM) 2 MG capsule Take 1 capsule (2 mg total) by mouth every 6 (six) hours as needed for diarrhea or loose stools. (Patient not taking: Reported on 04/21/2019) 30 capsule 0  . megestrol (MEGACE) 400 MG/10ML suspension Take 10 mLs  (400 mg total) by mouth daily. (Patient not taking: Reported on 04/21/2019) 480 mL 3  . ondansetron (ZOFRAN) 4 MG tablet Take 1 tablet (4 mg total) by mouth every 6 (six) hours as needed for nausea. (Patient not taking: Reported on 04/21/2019) 20 tablet 0    ALLERGIES:  No Known Allergies  FAM HX: Family History  Problem Relation Age of Onset  . Hypertension Mother   . Hypertension Father     Social History:   reports that she has been smoking cigarettes. She has never used smokeless tobacco. She reports that she does not drink alcohol or use drugs.  ROS: 12 system ROS neg except per HPI above  Blood pressure 122/75, pulse (!) 104, temperature 98.9 F (37.2 C), temperature source Oral, resp. rate 18, height 5\' 9"  (1.753 m), weight 71.8 kg, SpO2 96 %. PHYSICAL EXAM: Gen: comfortably sitting in bedside chair  Eyes:  anicteric ENT: MMM Neck: supple, no JVD CV:  Tachycardic at 100 and regular, no rub Abd:  Soft, mod distended suprapubic Lungs:  Clear ant GU: purewick Extr:  No edema, dec muscle bulk, R heel in inflatable boot due to wound Neuro: globally weak but nonfocal, very hard of hearing Skin: warm and dry   Results for orders placed or performed during the hospital encounter of 04/21/19 (from the past 48 hour(s))  CBC with Differential/Platelet     Status: Abnormal   Collection Time: 04/21/19  5:11 PM  Result Value Ref Range   WBC 7.4 4.0 - 10.5 K/uL   RBC 3.40 (L) 3.87 - 5.11 MIL/uL   Hemoglobin 10.0 (L) 12.0 - 15.0 g/dL   HCT 34.0 (L) 36.0 - 46.0 %   MCV 100.0 80.0 - 100.0 fL   MCH 29.4 26.0 - 34.0 pg   MCHC 29.4 (L) 30.0 - 36.0 g/dL   RDW 16.5 (H) 11.5 - 15.5 %   Platelets 216 150 - 400 K/uL   nRBC 0.0 0.0 - 0.2 %   Neutrophils Relative % 47 %   Neutro Abs 3.5 1.7 - 7.7 K/uL   Lymphocytes Relative 42 %   Lymphs Abs 3.1 0.7 - 4.0 K/uL   Monocytes Relative 8 %   Monocytes Absolute 0.6 0.1 - 1.0 K/uL   Eosinophils Relative 2 %   Eosinophils Absolute 0.1 0.0 -  0.5 K/uL   Basophils Relative 1 %   Basophils Absolute 0.1 0.0 - 0.1 K/uL   Immature Granulocytes 0 %   Abs Immature Granulocytes 0.02 0.00 - 0.07 K/uL    Comment: Performed at [facility], [place] [address] [place], [place] [id number]  Basic metabolic panel     Status: Abnormal   Collection  Time: 04/21/19  5:11 PM  Result Value Ref Range   Sodium 140 135 - 145 mmol/L   Potassium 6.0 (H) 3.5 - 5.1 mmol/L   Chloride 114 (H) 98 - 111 mmol/L   CO2 20 (L) 22 - 32 mmol/L   Glucose, Bld 123 (H) 70 - 99 mg/dL   BUN 36 (H) 8 - 23 mg/dL   Creatinine, Ser 2.33 (H) 0.44 - 1.00 mg/dL   Calcium 8.1 (L) 8.9 - 10.3 mg/dL   GFR calc non Af Amer 20 (L) >60 mL/min   GFR calc Af Amer 23 (L) >60 mL/min   Anion gap 6 5 - 15    Comment: Performed at [facility], [place] [address], [place], [place] [address]  Hemoglobin A1c     Status: Abnormal   Collection Time: 04/21/19  5:11 PM  Result Value Ref Range   Hgb A1c MFr Bld 5.7 (H) 4.8 - 5.6 %    Comment: (NOTE) Pre diabetes:          5.7%-6.4% Diabetes:              >6.4% Glycemic control for   <7.0% adults with diabetes    Mean Plasma Glucose 116.89 mg/dL    Comment: Performed at [facility] [address], [place], [place] [address]  I-stat chem 8, ED (not at [facility] or [facility])     Status: Abnormal   Collection Time: 04/21/19  5:17 PM  Result Value Ref Range   Sodium 141 135 - 145 mmol/L   Potassium 5.9 (H) 3.5 - 5.1 mmol/L   Chloride 113 (H) 98 - 111 mmol/L   BUN 36 (H) 8 - 23 mg/dL   Creatinine, Ser 2.10 (H) 0.44 - 1.00 mg/dL   Glucose, Bld 119 (H) 70 - 99 mg/dL   Calcium, Ion 1.13 (L) 1.15 - 1.40 mmol/L   TCO2 20 (L) 22 - 32 mmol/L   Hemoglobin 11.6 (L) 12.0 - 15.0 g/dL   HCT 34.0 (L) 36.0 - 46.0 %  Urinalysis, Routine w reflex microscopic     Status: Abnormal   Collection Time: 04/21/19  7:44 PM  Result Value Ref Range   Color, Urine STRAW (A) YELLOW   APPearance HAZY (A) CLEAR   Specific Gravity,  Urine 1.012 1.005 - 1.030   pH 7.0 5.0 - 8.0   Glucose, UA NEGATIVE NEGATIVE mg/dL   Hgb urine dipstick SMALL (A) NEGATIVE   Bilirubin Urine NEGATIVE NEGATIVE   Ketones, ur NEGATIVE NEGATIVE mg/dL   Protein, ur 30 (A) NEGATIVE mg/dL   Nitrite POSITIVE (A) NEGATIVE   Leukocytes,Ua LARGE (A) NEGATIVE   RBC / HPF 6-10 0 - 5 RBC/hpf   WBC, UA >50 (H) 0 - 5 WBC/hpf   Bacteria, UA MANY (A) NONE SEEN   Squamous Epithelial / LPF 11-20 0 - 5   Non Squamous Epithelial 0-5 (A) NONE SEEN    Comment: Performed at [facility], [place] [address], [place], [place] [address]  Creatinine, urine, random     Status: None   Collection Time: 04/21/19  7:44 PM  Result Value Ref Range   Creatinine, Urine 63.38 mg/dL    Comment: Performed at [facility], [place] [address], [place], [place] [address]  Sodium, urine, random     Status: None   Collection Time: 04/21/19  7:44 PM  Result Value Ref Range   Sodium, Ur 144 mmol/L    Comment: Performed at [facility], [place] [address] [place], [place] [address]  Magnesium     Status: None   Collection Time: 04/21/19  7:44 PM  Result Value Ref Range   Magnesium 2.3 1.7 - 2.4 mg/dL    Comment: Performed at [facility], [place] [address] [place], [place] [address]  Phosphorus     Status: None   Collection Time: 04/21/19  7:44 PM  Result Value Ref Range   Phosphorus 4.2 2.5 - 4.6 mg/dL    Comment: Performed at [facility], [place] [address], [place], [place] [address]  Hepatic function panel     Status: Abnormal   Collection Time: 04/21/19  7:44 PM  Result Value Ref Range   Total Protein 7.2 6.5 - 8.1 g/dL   Albumin 3.8 3.5 - 5.0 g/dL   AST 28 15 - 41 U/L   ALT 30 0 - 44 U/L   Alkaline Phosphatase 202 (H) 38 - 126 U/L   Total Bilirubin 0.2 (L) 0.3 - 1.2 mg/dL   Bilirubin, Direct 0.1 0.0 - 0.2 mg/dL   Indirect Bilirubin 0.1 (L) 0.3 - 0.9 mg/dL    Comment: Performed at  [facility], [place] [address] [place], [place] [address]  CK     Status: None   Collection Time: 04/21/19  7:44 PM  Result Value Ref Range   Total CK 82 38 - 234 U/L    Comment: Performed at [facility], [place] [address], [place], [place] [address]  Reticulocytes     Status: Abnormal   Collection Time: 04/21/19  7:44 PM  Result Value Ref Range   Retic Ct Pct 1.1 0.4 - 3.1 %   RBC. 3.45 (L) 3.87 - 5.11 MIL/uL   Retic Count, Absolute 38.6 19.0 - 186.0 K/uL   Immature Retic Fract 8.5 2.3 - 15.9 %    Comment: Performed at [facility], [place] [address] [place], [place] [address]  Urine Culture     Status: Abnormal (Preliminary result)   Collection Time: 04/21/19  7:44 PM   Specimen: Urine, Clean Catch  Result Value Ref Range   Specimen Description      URINE, CLEAN CATCH Performed at [facility], [place] [address], [place], [place] [address]    Special Requests      Normal Performed at [facility], [place] [address], [place], [place] [address]    Culture 70,000 COLONIES/mL GRAM NEGATIVE RODS (A)    Report Status PENDING   Vitamin B12     Status: None   Collection Time: 04/21/19  7:45 PM  Result Value Ref Range   Vitamin B-12 608 180 - 914 pg/mL    Comment: (NOTE) This assay is not validated for testing neonatal or myeloproliferative syndrome specimens for Vitamin B12 levels. Performed at [facility], [place] [address], [place], [place] [address]   Folate     Status: None   Collection Time: 04/21/19  7:45 PM  Result Value Ref Range   Folate 6.4 >5.9 ng/mL    Comment: Performed at [facility], [place] [address], [place], [place] [address]  Iron and TIBC     Status: Abnormal   Collection Time: 04/21/19  7:45 PM  Result Value Ref Range   Iron 60 28 - 170 ug/dL   TIBC 237 (L) 250 - 450 ug/dL   Saturation Ratios 25 10.4 - 31.8 %   UIBC 177 ug/dL  Comment: Performed at [facility], [place] [address], [place], [place] [address]  Ferritin     Status: None   Collection Time: 04/21/19  7:45 PM  Result Value Ref Range   Ferritin 27 11 - 307 ng/mL    Comment: Performed at [facility], [place] [address] [place], [place] [address]  Blood gas, venous     Status: Abnormal   Collection Time: 04/21/19  7:59 PM  Result Value Ref Range   FIO2 21.00    pH, Ven 7.248 (L) 7.250 - 7.430   pCO2, Ven 51.9 44.0 - 60.0 mmHg   pO2, Ven BELOW REPORTABLE RANGE 32.0 - 45.0 mmHg    Comment: CRITICAL RESULT CALLED TO, READ BACK BY AND VERIFIED WITH: Toy Baker MD AT 2006 BY AMY RAY RRT RCP ON 04/21/2019    Bicarbonate 21.9 20.0 - 28.0 mmol/L   Acid-base deficit 4.9 (H) 0.0 - 2.0 mmol/L   O2 Saturation 42.7 %   Patient temperature 98.5    Collection site VEIN    Drawn by COLLECTED BY NURSE    Sample type VENOUS     Comment: Performed at [place] [address], [place], [place] [address]  Cortisol     Status: None   Collection Time: 04/21/19  7:59 PM  Result Value Ref Range   Cortisol, Plasma 5.0 ug/dL    Comment: (NOTE) AM    6.7 - 22.6 ug/dL PM   <10.0       ug/dL Performed at [place] [address], [place], [place] [address]   SARS Coronavirus 2 [facility] order, Performed in [facility] hospital lab) Nasopharyngeal Nasopharyngeal Swab     Status: None   Collection Time: 04/21/19  8:18 PM   Specimen: Nasopharyngeal Swab  Result Value Ref Range   SARS Coronavirus 2 NEGATIVE NEGATIVE    Comment: (NOTE) If result is NEGATIVE SARS-CoV-2 target nucleic acids are NOT DETECTED. The SARS-CoV-2 RNA is generally detectable in upper and lower  respiratory specimens during the acute phase of infection. The lowest  concentration of SARS-CoV-2 viral copies this assay can detect is 250  copies / mL. A negative result does not preclude SARS-CoV-2 infection  and should not be  used as the sole basis for treatment or other  patient management decisions.  A negative result may occur with  improper specimen collection / handling, submission of specimen other  than nasopharyngeal swab, presence of viral mutation(s) within the  areas targeted by this assay, and inadequate number of viral copies  (<250 copies / mL). A negative result must be combined with clinical  observations, patient history, and epidemiological information. If result is POSITIVE SARS-CoV-2 target nucleic acids are DETECTED. The SARS-CoV-2 RNA is generally detectable in upper and lower  respiratory specimens dur ing the acute phase of infection.  Positive  results are indicative of active infection with SARS-CoV-2.  Clinical  correlation with patient history and other diagnostic information is  necessary to determine patient infection status.  Positive results do  not rule out bacterial infection or co-infection with other viruses. If result is PRESUMPTIVE POSTIVE SARS-CoV-2 nucleic acids MAY BE PRESENT.   A presumptive positive result was obtained on the submitted specimen  and confirmed on repeat testing.  While 2019 novel coronavirus  (SARS-CoV-2) nucleic acids may be present in the submitted sample  additional confirmatory testing may be necessary for epidemiological  and / or clinical management purposes  to differentiate  between  SARS-CoV-2 and other Sarbecovirus currently known to infect humans.  If clinically indicated additional testing with an alternate test  methodology [phone]) is advised. The SARS-CoV-2 RNA is generally  detectable in upper and lower respiratory sp ecimens during the acute  phase of infection. The expected result is Negative. Fact Sheet for Patients:  StrictlyIdeas.no Fact Sheet for Healthcare Providers: BankingDealers.co.za This test is not yet approved or cleared by the [place] FDA and has been authorized  for detection and/or diagnosis of SARS-CoV-2 by FDA under an Emergency Use Authorization (EUA).  This EUA will remain in effect (meaning this test can be used) for the duration of the COVID-19 declaration under Section 564(b)(1) of the Act, 21 U.S.C. section 360bbb-3(b)(1), unless the authorization is terminated or revoked sooner. Performed at [facility], [place] [address] [place], [place] [address]   CBG monitoring, ED     Status: None   Collection Time: 04/21/19 10:24 PM  Result Value Ref Range   Glucose-Capillary 74 70 - 99 mg/dL  CBG monitoring, ED     Status: Abnormal   Collection Time: 04/22/19  1:45 AM  Result Value Ref Range   Glucose-Capillary 122 (H) 70 - 99 mg/dL  CBG monitoring, ED     Status: Abnormal   Collection Time: 04/22/19  5:01 AM  Result Value Ref Range   Glucose-Capillary 156 (H) 70 - 99 mg/dL   Comment 1 Notify RN   Magnesium     Status: None   Collection Time: 04/22/19  5:40 AM  Result Value Ref Range   Magnesium 2.0 1.7 - 2.4 mg/dL    Comment: Performed at [facility], [place] [address], [place], [place] [address]  Phosphorus     Status: None   Collection Time: 04/22/19  5:40 AM  Result Value Ref Range   Phosphorus 4.2 2.5 - 4.6 mg/dL    Comment: Performed at [facility], [place] [address] [place], [place] [address]  TSH     Status: None   Collection Time: 04/22/19  5:40 AM  Result Value Ref Range   TSH 2.246 0.350 - 4.500 uIU/mL    Comment: Performed by a 3rd Generation assay with a functional sensitivity of <=0.01 uIU/mL. Performed at [facility], [place] [address], [place], [place] [address]   Comprehensive metabolic panel     Status: Abnormal   Collection Time: 04/22/19  5:40 AM  Result Value Ref Range   Sodium 140 135 - 145 mmol/L   Potassium 5.4 (H) 3.5 - 5.1 mmol/L   Chloride 113 (H) 98 - 111 mmol/L   CO2 22 22 - 32 mmol/L   Glucose, Bld 188 (H) 70 - 99 mg/dL   BUN 34  (H) 8 - 23 mg/dL   Creatinine, Ser 1.90 (H) 0.44 - 1.00 mg/dL   Calcium 8.1 (L) 8.9 - 10.3 mg/dL   Total Protein 7.0 6.5 - 8.1 g/dL   Albumin 3.8 3.5 - 5.0 g/dL   AST 22 15 - 41 U/L   ALT 27 0 - 44 U/L   Alkaline Phosphatase 192 (H) 38 - 126 U/L   Total Bilirubin 0.2 (L) 0.3 - 1.2 mg/dL   GFR calc non Af Amer 25 (L) >60 mL/min   GFR calc Af Amer 29 (L) >60 mL/min   Anion gap 5 5 - 15    Comment: Performed at [facility], [place] [address] [place], [place] [address]  CBC  Status: Abnormal   Collection Time: 04/22/19  5:40 AM  Result Value Ref Range   WBC 10.1 4.0 - 10.5 K/uL   RBC 3.40 (L) 3.87 - 5.11 MIL/uL   Hemoglobin 9.9 (L) 12.0 - 15.0 g/dL   HCT 33.3 (L) 36.0 - 46.0 %   MCV 97.9 80.0 - 100.0 fL   MCH 29.1 26.0 - 34.0 pg   MCHC 29.7 (L) 30.0 - 36.0 g/dL   RDW 16.4 (H) 11.5 - 15.5 %   Platelets 220 150 - 400 K/uL   nRBC 0.0 0.0 - 0.2 %    Comment: Performed at [facility] [place], [place] [address], [place], [place] [address]  CBG monitoring, ED     Status: Abnormal   Collection Time: 04/22/19  8:34 AM  Result Value Ref Range   Glucose-Capillary 146 (H) 70 - 99 mg/dL  CBG monitoring, ED     Status: Abnormal   Collection Time: 04/22/19 11:18 AM  Result Value Ref Range   Glucose-Capillary 141 (H) 70 - 99 mg/dL  Glucose, capillary     Status: Abnormal   Collection Time: 04/22/19  4:44 PM  Result Value Ref Range   Glucose-Capillary 144 (H) 70 - 99 mg/dL  Glucose, capillary     Status: Abnormal   Collection Time: 04/22/19  8:13 PM  Result Value Ref Range   Glucose-Capillary 150 (H) 70 - 99 mg/dL   Comment 1 Notify RN    Comment 2 Document in Chart   Glucose, capillary     Status: Abnormal   Collection Time: 04/22/19 11:39 PM  Result Value Ref Range   Glucose-Capillary 110 (H) 70 - 99 mg/dL   Comment 1 Notify RN    Comment 2 Document in Chart   CBC     Status: Abnormal   Collection Time: 04/23/19  2:24 AM  Result Value Ref Range   WBC  14.4 (H) 4.0 - 10.5 K/uL   RBC 3.53 (L) 3.87 - 5.11 MIL/uL   Hemoglobin 10.4 (L) 12.0 - 15.0 g/dL   HCT 34.9 (L) 36.0 - 46.0 %   MCV 98.9 80.0 - 100.0 fL   MCH 29.5 26.0 - 34.0 pg   MCHC 29.8 (L) 30.0 - 36.0 g/dL   RDW 15.9 (H) 11.5 - 15.5 %   Platelets 175 150 - 400 K/uL   nRBC 0.0 0.0 - 0.2 %    Comment: Performed at [facility], [place] [address] [place], [place] [id number]  Basic metabolic panel     Status: Abnormal   Collection Time: 04/23/19  2:24 AM  Result Value Ref Range   Sodium 140 135 - 145 mmol/L   Potassium 5.0 3.5 - 5.1 mmol/L   Chloride 113 (H) 98 - 111 mmol/L   CO2 20 (L) 22 - 32 mmol/L   Glucose, Bld 141 (H) 70 - 99 mg/dL   BUN 37 (H) 8 - 23 mg/dL   Creatinine, Ser 2.38 (H) 0.44 - 1.00 mg/dL   Calcium 8.0 (L) 8.9 - 10.3 mg/dL   GFR calc non Af Amer 19 (L) >60 mL/min   GFR calc Af Amer 22 (L) >60 mL/min   Anion gap 7 5 - 15    Comment: Performed at [facility], [place] [address] [place], [place] [address]  Glucose, capillary     Status: Abnormal   Collection Time: 04/23/19  3:49 AM  Result Value Ref Range   Glucose-Capillary 130 (H) 70 - 99 mg/dL  Comment 1 Notify RN    Comment 2 Document in Chart   Glucose, capillary     Status: Abnormal   Collection Time: 04/23/19  8:37 AM  Result Value Ref Range   Glucose-Capillary 131 (H) 70 - 99 mg/dL    US Renal  Result Date: 04/23/2019 CLINICAL DATA:  Acute kidney injury.  Hydronephrosis. EXAM: RENAL / URINARY TRACT ULTRASOUND COMPLETE COMPARISON:  04/22/2018 FINDINGS: Right Kidney: Renal measurements: 9.5 x 5.1 x 4.4 cm = volume: 114 mL. No renal masses identified. Moderate to severe right hydronephrosis is seen which is increased since previous study. Left Kidney: Renal measurements: 8.0 x 3.6 x 4.5 cm = volume: 67 mL. No renal masses identified. Mild-to-moderate left hydronephrosis is seen, which is increased since previous study. Bladder: Urinary bladder is distended but otherwise  unremarkable in appearance. IMPRESSION: Bilateral hydronephrosis which is increased since prior exam. Distended urinary bladder. Recommend clinical correlation for possible urinary retention. Electronically Signed   By: Marlaine Hind M.D.   On: 04/23/2019 10:04    Assessment/Plan **AKI on CKD:  Likely multifactorial with poor po intake prior to admission but imaging now showing obstruction.  D/w primary - will place foley and call urology.  I suspect with relief of obstruction she will return to CKD 3.    **Hyperkalemia:  K 5 this AM after fluids and lokelma.  Suspect will improve after obstruction relieved but if continues would d/c on low K diet.  Hold RAAS inhibition if needs antiHTN therapy.   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  Either way fix AKI etiology and supplement bicarb with po if needed to maintain serum bicarb >/=22.  Cont po bicarb for now.   **Anemia, normocytic:  Hb 10.4.  MCV 99.  No indication for transfusion or ESA at this time.  Iron replete. CTM.  **DM:  A1c 5.7.  Per primary. Has diabetic foot ulcer and is receiving wound care.   **HTN:  Normotensive this AM.    **UTI: on ceftriaxone. Culture pending.  Try to use I/O cath but may end up needing foley.   **PAD: no acute issues.  Will sign off, can f/u in nephrology clinic in 1-2 months for ongoing CKD 3 care.  I will request the visit be scheduled.  Please call me back with questions/concerns.   Justin Mend 04/23/2019, 11:38 AM

## 2019-04-24 LAB — BASIC METABOLIC PANEL WITH GFR
Anion gap: 8 (ref 5–15)
BUN: 38 mg/dL — ABNORMAL HIGH (ref 8–23)
CO2: 21 mmol/L — ABNORMAL LOW (ref 22–32)
Calcium: 7.8 mg/dL — ABNORMAL LOW (ref 8.9–10.3)
Chloride: 115 mmol/L — ABNORMAL HIGH (ref 98–111)
Creatinine, Ser: 2.53 mg/dL — ABNORMAL HIGH (ref 0.44–1.00)
GFR calc Af Amer: 21 mL/min — ABNORMAL LOW
GFR calc non Af Amer: 18 mL/min — ABNORMAL LOW
Glucose, Bld: 113 mg/dL — ABNORMAL HIGH (ref 70–99)
Potassium: 4.5 mmol/L (ref 3.5–5.1)
Sodium: 144 mmol/L (ref 135–145)

## 2019-04-24 LAB — GLUCOSE, CAPILLARY
Glucose-Capillary: 102 mg/dL — ABNORMAL HIGH (ref 70–99)
Glucose-Capillary: 102 mg/dL — ABNORMAL HIGH (ref 70–99)
Glucose-Capillary: 103 mg/dL — ABNORMAL HIGH (ref 70–99)
Glucose-Capillary: 106 mg/dL — ABNORMAL HIGH (ref 70–99)
Glucose-Capillary: 131 mg/dL — ABNORMAL HIGH (ref 70–99)
Glucose-Capillary: 148 mg/dL — ABNORMAL HIGH (ref 70–99)
Glucose-Capillary: 153 mg/dL — ABNORMAL HIGH (ref 70–99)
Glucose-Capillary: 83 mg/dL (ref 70–99)

## 2019-04-24 LAB — CBC
HCT: 31.3 % — ABNORMAL LOW (ref 36.0–46.0)
Hemoglobin: 9.2 g/dL — ABNORMAL LOW (ref 12.0–15.0)
MCH: 29.4 pg (ref 26.0–34.0)
MCHC: 29.4 g/dL — ABNORMAL LOW (ref 30.0–36.0)
MCV: 100 fL (ref 80.0–100.0)
Platelets: 195 K/uL (ref 150–400)
RBC: 3.13 MIL/uL — ABNORMAL LOW (ref 3.87–5.11)
RDW: 16.4 % — ABNORMAL HIGH (ref 11.5–15.5)
WBC: 12 K/uL — ABNORMAL HIGH (ref 4.0–10.5)
nRBC: 0 % (ref 0.0–0.2)

## 2019-04-24 LAB — URINE CULTURE
Culture: 70000 — AB
Special Requests: NORMAL

## 2019-04-24 LAB — SARS CORONAVIRUS 2 (TAT 6-24 HRS): SARS Coronavirus 2: NEGATIVE

## 2019-04-24 MED ORDER — SODIUM CHLORIDE 0.9 % IV SOLN
2.0000 g | INTRAVENOUS | Status: DC
  Administered 2019-04-24 – 2019-04-25 (×2): 2 g via INTRAVENOUS
  Filled 2019-04-24 (×3): qty 2

## 2019-04-24 NOTE — Progress Notes (Signed)
RN and NT went to pt room for morning rounds. Pt was very lethargic and unable to fully awake to a sternal rub. Pt drenched in sweat. Pt CBG 83. BP 83/55. All other VSS. Called another RN to room to assess. Recheck BP 91/55. CBG 103. Cold compress applied to pt forehead with no response. Rolled pt to change linen and gown, pt aroused more and helped [place] roll her. Slid pt up in bed and pt became completely awake and alert. BP 102/59. Sat pt up and pt A&O x2 which is baseline. Pt offered cranberry juice. BP 100/70. All other VSS. Pt awake and watching tv. On call notified of events. Will monitor pt very closely.

## 2019-04-24 NOTE — TOC Initial Note (Signed)
Transition of Care [facility]) - Initial/Assessment Note    Patient Details  Name: Stephanie Olson MRN: [id number] Date of Birth: Jun 16, 1941  [facility]) CM/SW Contact:    Wende Neighbors, LCSW Phone Number: 04/24/2019, 12:13 PM  Clinical Narrative:   CSW spoke with patients son [name]) via phone. [name] stated that patient is from [facility] and has been ar facility for over a year. [name] stated that patient is at [facility] long term and he would like patient to discharge back when medically cleared.  CSW reached out to [facility] admission coordinator and she stated she will be able to accept patient back at [facility]                Expected Discharge Plan: [place] Barriers to Discharge: Continued Medical Work up   Patient Goals and CMS Choice Patient states their goals for this hospitalization and ongoing recovery are:: to have pt dc back to [facility]   Choice offered to / list presented to : Adult Children  Expected Discharge Plan and Services Expected Discharge Plan: [place] In-house Referral: Clinical Social Work   Post Acute Care Choice: [place] Living arrangements for the past 2 months: [place]                                      Prior Living Arrangements/Services Living arrangements for the past 2 months: [place] Lives with:: Facility Resident Patient language and need for interpreter reviewed:: Yes        Need for Family Participation in Patient Care: Yes (Comment) Care giver support system in place?: Yes (comment)   Criminal Activity/Legal Involvement Pertinent to Current Situation/Hospitalization: No - Comment as needed  Activities of Daily Living Home Assistive Devices/Equipment: Wheelchair ADL Screening (condition at time of admission) Patient's cognitive ability adequate to safely complete daily activities?: No Is the patient deaf or have  difficulty hearing?: Yes Does the patient have difficulty seeing, even when wearing glasses/contacts?: No Does the patient have difficulty concentrating, remembering, or making decisions?: Yes Patient able to express need for assistance with ADLs?: Yes Does the patient have difficulty dressing or bathing?: Yes Independently performs ADLs?: No Communication: Independent Dressing (OT): Needs assistance Is this a change from baseline?: Pre-admission baseline Grooming: Needs assistance Is this a change from baseline?: Pre-admission baseline Feeding: Needs assistance Is this a change from baseline?: Pre-admission baseline Bathing: Needs assistance Is this a change from baseline?: Pre-admission baseline Toileting: Needs assistance Is this a change from baseline?: Pre-admission baseline In/Out Bed: Needs assistance Is this a change from baseline?: Pre-admission baseline Walks in Home: Needs assistance Is this a change from baseline?: Pre-admission baseline Does the patient have difficulty walking or climbing stairs?: Yes Weakness of Legs: Both Weakness of Arms/Hands: None  Permission Sought/Granted Permission sought to share information with : Facility Art therapist granted to share information with : Yes, Verbal Permission Granted  Share Information with NAME: Lonzo Cloud  Permission granted to share info w AGENCY: [facility]  Permission granted to share info w Relationship: son  Permission granted to share info w Contact Information: [phone]  Emotional Assessment Appearance:: Appears stated age Attitude/Demeanor/Rapport: Unable to Assess Affect (typically observed): Unable to Assess Orientation: : Oriented to Self, Oriented to Place, Oriented to Situation Alcohol / Substance Use: Not Applicable Psych Involvement: No (comment)  Admission diagnosis:  Abnormal Labs Patient Active Problem List   Diagnosis Date Noted  . Acute lower UTI 04/22/2019  .  Metabolic acidosis [id number]  . Malnutrition of moderate degree 04/25/2018  . Pressure injury of skin 04/23/2018  . Acute renal failure ([place])   . Urinary retention   . AKI (acute kidney injury) ([place])   . Renal failure (ARF), acute on chronic (HCC) 04/22/2018  . Essential hypertension 04/22/2018  . Normocytic anemia 04/22/2018  . Metabolic acidosis, normal anion gap (NAG) 04/22/2018  . Hyperkalemia 04/22/2018  . Aspiration into airway 04/22/2018  . Generalized weakness 04/22/2018  . Lethargy 04/22/2018  . Poor appetite 04/22/2018  . Failure to thrive in adult 04/22/2018  . Diabetic neuropathy ([place]) 11/21/2015  . 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/2015  . Diabetic ulcer of foot, limited to breakdown of skin ([place]) 11/21/2015  . Foot pain, left 02/14/2015  . PVD (peripheral vascular disease) ([place]) 01/29/2015   PCP:  Benito Mccreedy, MD Pharmacy:  No Pharmacies Listed    Social Determinants of Health (SDOH) Interventions    Readmission Risk Interventions No flowsheet data found.

## 2019-04-24 NOTE — Consult Note (Signed)
[place] Nurse wound consult note Reason for Consult: right foot wound History of skin graft; patient is followed by MD in SNF for wound  Wound type:  Deep Tissue Pressure Injury right lateral foot; 1cm x 2cm x 0cm  Evidence of healed skin graft right lateral heel Pressure Injury POA: Yes Measurement: see above  Wound bed: dark purple non blanchable injury right lateral foot  Drainage (amount, consistency, odor) none Periwound: intact, palpable distal pulses  Dressing procedure/placement/frequency: Silicone foam dressing to the right and left heel and right lateral foot to protect. Prevalon boot in place for the RLE.   Discussed POC with patient and bedside nurse.  Re consult if needed, will not follow at this time. Thanks  Maceo Hernan [employer], RN,CWOCN, CNS, [place] [phone])

## 2019-04-24 NOTE — Progress Notes (Signed)
[facility]                                                                              Patient Demographics  Stephanie Olson, is a 78 y.o. female, DOB - 1941/08/22, [id number]  Admit date - 04/21/2019   Admitting Physician Toy Baker, MD  Outpatient Primary MD for the patient is Benito Mccreedy, MD  Outpatient specialists:   LOS - 2  days   Medical records reviewed and are as summarized below:    No chief complaint on file.      Brief summary   Patient is 78 year old female with diabetes, PVD with stent in right lower extremity, hypertension, failure to thrive, hyperkalemia, presented with abnormal labs from the nursing facility.  Patient otherwise did not complain of any abdominal pain nausea vomiting or diarrhea. Patient was treated with IV fluids and Lokelma 10 g x 1 Patient was also found to have UTI BMET showed creatinine of 2.3, potassium 6.0 with non-anion gap acidosis.  Patient was admitted for further work-up.  Assessment & Plan    Principal Problem:   AKI (acute kidney injury) ([place]) on CKD stage IV, hyperkalemia, non-anion gap acidosis - Presented with creatinine of 2.3, potassium 6.0, non-anion gap acidosis.  No uremia, making urine.  Likely worsened due to poor oral intake, acute UTI, worsening of CKD stage IV, obstructive uropathy -Patient was placed on IV fluid hydration.  Renal ultrasound was obtained, nephrology  consulted.   - Renal ultrasound shows bilateral hydronephrosis which is increased since prior exam, distended urinary bladder -Seen by urology Dr. Tresa Moore, recommended Foley catheter and keep current Foley at discharge.  Outpatient follow-up with Dr. Gloriann Loan in about 2 weeks. -Recommended continue current antibiotics for UTI/cystitis. -Urology felt to have poor emptying of the bladder and diabetes of the predisposing factors for the cystitis -Creatinine continues to worsen today, will increase IV fluids to 100  cc/hour  Active Problems: Acute Pseudomonas UTI, complicated -DC IV Rocephin, placed on IV cefepime, for 7 days for complicated UTI.  Will change to oral ciprofloxacin at the time of discharge.     PVD (peripheral vascular disease) ([place]) -Currently stable    Diabetic neuropathy ([place]), Right Diabetic ulcer of foot, limited to breakdown of skin ([place]) -Wound care consult    Essential hypertension -Hypotensive, increase IV fluids to 100cc/hr, follow blood cultures    Normocytic anemia likely anemia of chronic disease -Anemia panel consistent with anemia of chronic disease -H&H close to baseline    Hyperkalemia, NAG metabolic acidosis -Likely due to AKI on CKD stage IV, presented with K 6.0.  Patient received Lokelma x2 -Continue sodium bicarb, potassium improving  Diabetes mellitus type 2 -Hemoglobin A1c 5.7, continue sliding scale insulin  Generalized debility, failure to thrive PT OT evaluation recommended skilled nursing facility  Hyperlipidemia Continue Pravachol  Code Status: Full CODE STATUS DVT Prophylaxis: Lovenox Family Communication: called and updated labs, imaging and management to patient's son Daine Gravel on 8/16   Disposition Plan: Remains inpatient, creatinine function worsening, needs nephrology consult, follow urine culture and sensitivities.   Time Spent in minutes 25 minutes  Procedures:  None  Consultants:   Nephrology urology  Antimicrobials:   Anti-infectives (From admission, onward)   Start     Dose/Rate Route Frequency Ordered Stop   04/24/19 1600  ceFEPIme (MAXIPIME) 2 g in sodium chloride 0.9 % 100 mL IVPB     2 g 200 mL/hr over 30 Minutes Intravenous Every 24 hours 04/24/19 1408     04/21/19 2200  cefTRIAXone (ROCEPHIN) 1 g in sodium chloride 0.9 % 100 mL IVPB  Status:  Discontinued     1 g 200 mL/hr over 30 Minutes Intravenous Every 24 hours 04/21/19 2159 04/24/19 1408         Medications  Scheduled Meds:  aspirin  81  mg Oral Daily   DULoxetine  20 mg Oral Daily   enoxaparin (LOVENOX) injection  30 mg Subcutaneous Q24H   feeding supplement (NEPRO CARB STEADY)  237 mL Oral Q24H   feeding supplement (PRO-STAT SUGAR FREE 64)  30 mL Oral BID   insulin aspart  0-9 Units Subcutaneous Q4H   mirtazapine  7.5 mg Oral QHS   pravastatin  10 mg Oral QHS   senna  1 tablet Oral BID   sodium bicarbonate  650 mg Oral BID   tamsulosin  0.4 mg Oral Daily   Continuous Infusions:  sodium chloride 75 mL/hr at 04/24/19 1229   ceFEPime (MAXIPIME) IV     PRN Meds:.acetaminophen **OR** acetaminophen, hydrALAZINE, HYDROcodone-acetaminophen, ondansetron **OR** ondansetron (ZOFRAN) IV      Subjective:   Stephanie Olson was seen and examined today.  Borderline BP, somewhat somnolent but arousable.  Denies any pain.  Low-grade temp of 100.7 F.  Making urine.  No chest pain, dizziness, shortness of breath, nausea or vomiting.   Objective:   Vitals:   04/24/19 0622 04/24/19 0623 04/24/19 0833 04/24/19 1219  BP: (!) 86/56 (!) 92/52 112/67 (!) 106/58  Pulse: 86 85 77 94  Resp: 12  16 17   Temp: 98 F (36.7 C)  98.8 F (37.1 C) (!) 100.7 F (38.2 C)  TempSrc: Oral  Oral Oral  SpO2: 98% 100% 96% 95%  Weight:      Height:        Intake/Output Summary (Last 24 hours) at 04/24/2019 1411 Last data filed at 04/24/2019 1230 Gross per 24 hour  Intake 3218.55 ml  Output 1050 ml  Net 2168.55 ml     Wt Readings from Last 3 Encounters:  04/22/19 71.8 kg  04/27/18 62.3 kg  11/21/15 85.3 kg   Physical Exam  General: Somnolent but arousable  Eyes:   HEENT:   Cardiovascular: S1 S2 clear, RRR. No pedal edema b/l  Respiratory: CTAB, no wheezing, rales or rhonchi  Gastrointestinal: Soft, nontender, nondistended, NBS  Ext: no pedal edema bilaterally  Neuro: no new deficits  Musculoskeletal: No cyanosis, clubbing  Skin: No rashes  Psych: Somnolent   Data Reviewed:  I have personally reviewed  following labs and imaging studies  Micro Results Recent Results (from the past 240 hour(s))  Urine Culture     Status: Abnormal   Collection Time: 04/21/19  7:44 PM   Specimen: Urine, Clean Catch  Result Value Ref Range Status   Specimen Description   Final    URINE, CLEAN CATCH Performed at [place] [address] [place], [place] [address]    Special Requests   Final    Normal Performed at [facility] [place], [place] [address] [place], [place] [address]    Culture 70,000 COLONIES/mL PSEUDOMONAS AERUGINOSA (A)  Final   Report Status 04/24/2019 FINAL  Final   Organism ID, Bacteria PSEUDOMONAS AERUGINOSA (A)  Final      Susceptibility   Pseudomonas aeruginosa - MIC*    CEFTAZIDIME 4 SENSITIVE Sensitive     CIPROFLOXACIN <=0.25 SENSITIVE Sensitive     GENTAMICIN <=1 SENSITIVE Sensitive     IMIPENEM 1 SENSITIVE Sensitive     CEFEPIME <=1 SENSITIVE Sensitive     * 70,000 COLONIES/mL PSEUDOMONAS AERUGINOSA  SARS Coronavirus 2 [facility] order, Performed in [facility] hospital lab) Nasopharyngeal Nasopharyngeal Swab     Status: None   Collection Time: 04/21/19  8:18 PM   Specimen: Nasopharyngeal Swab  Result Value Ref Range Status   SARS Coronavirus 2 NEGATIVE NEGATIVE Final    Comment: (NOTE) If result is NEGATIVE SARS-CoV-2 target nucleic acids are NOT DETECTED. The SARS-CoV-2 RNA is generally detectable in upper and lower  respiratory specimens during the acute phase of infection. The lowest  concentration of SARS-CoV-2 viral copies this assay can detect is 250  copies / mL. A negative result does not preclude SARS-CoV-2 infection  and should not be used as the sole basis for treatment or other  patient management decisions.  A negative result may occur with  improper specimen collection / handling, submission of specimen other  than nasopharyngeal swab, presence of viral mutation(s) within the  areas targeted by this assay, and inadequate  number of viral copies  (<250 copies / mL). A negative result must be combined with clinical  observations, patient history, and epidemiological information. If result is POSITIVE SARS-CoV-2 target nucleic acids are DETECTED. The SARS-CoV-2 RNA is generally detectable in upper and lower  respiratory specimens dur ing the acute phase of infection.  Positive  results are indicative of active infection with SARS-CoV-2.  Clinical  correlation with patient history and other diagnostic information is  necessary to determine patient infection status.  Positive results do  not rule out bacterial infection or co-infection with other viruses. If result is PRESUMPTIVE POSTIVE SARS-CoV-2 nucleic acids MAY BE PRESENT.   A presumptive positive result was obtained on the submitted specimen  and confirmed on repeat testing.  While 2019 novel coronavirus  (SARS-CoV-2) nucleic acids may be present in the submitted sample  additional confirmatory testing may be necessary for epidemiological  and / or clinical management purposes  to differentiate between  SARS-CoV-2 and other Sarbecovirus currently known to infect humans.  If clinically indicated additional testing with an alternate test  methodology [phone]) is advised. The SARS-CoV-2 RNA is generally  detectable in upper and lower respiratory sp ecimens during the acute  phase of infection. The expected result is Negative. Fact Sheet for Patients:  StrictlyIdeas.no Fact Sheet for Healthcare Providers: BankingDealers.co.za This test is not yet approved or cleared by the [place] FDA and has been authorized for detection and/or diagnosis of SARS-CoV-2 by FDA under an Emergency Use Authorization (EUA).  This EUA will remain in effect (meaning this test can be used) for the duration of the COVID-19 declaration under Section 564(b)(1) of the Act, 21 U.S.C. section 360bbb-3(b)(1), unless the  authorization is terminated or revoked sooner. Performed at [facility], [place] [address] [place],  [address]     Radiology Reports Ct Abdomen Pelvis Wo Contrast  Result Date: 04/23/2019 CLINICAL DATA:  Acute kidney injury with bilateral hydronephrosis and distended urinary bladder. EXAM: CT ABDOMEN AND PELVIS WITHOUT CONTRAST TECHNIQUE: Multidetector CT imaging of the abdomen and pelvis was performed following the  standard protocol without IV contrast. COMPARISON:  Renal ultrasound from earlier same day. CT abdomen dated 04/22/2018. FINDINGS: Lower chest: Patchy consolidation at the LEFT lung base, most likely atelectasis. Hepatobiliary: Layering stones within the moderately distended gallbladder. No focal liver abnormality. No bile duct dilatation seen. Pancreas: Unremarkable. No pancreatic ductal dilatation or surrounding inflammatory changes. Spleen: Normal in size without focal abnormality. Adrenals/Urinary Tract: Adrenal glands again appear bulbous bilaterally but without discrete nodule or mass. Small LEFT renal cyst, upper pole. Mild bilateral hydronephrosis, as demonstrated on today's renal ultrasound, decreased in severity compared to the earlier CT abdomen of 04/22/2018. No ureteral calculi identified. Bladder is now decompressed by Foley catheter. Stomach/Bowel: Moderate size stool ball in the rectal vault. No dilated large or small bowel loops. No evidence of bowel wall inflammation seen. Appendix is normal. Stomach is unremarkable, partially decompressed. Vascular/Lymphatic: Aortic atherosclerosis. No enlarged lymph nodes seen. Reproductive: Uterus and bilateral adnexa are unremarkable. Other: Small amount of ascites within the abdomen and pelvis. No abscess collection identified. No free intraperitoneal air. Musculoskeletal: No acute or suspicious osseous finding. Degenerative spondylosis of the lumbar spine, mild to moderate in degree. IMPRESSION: 1. Mild bilateral  hydronephrosis, as demonstrated on today's renal ultrasound, decreased in severity compared to the earlier CT abdomen of 04/22/2018. No ureteral calculi identified. Bladder is now decompressed by Foley catheter. 2. Small amount of ascites within the abdomen and pelvis. No abscess collection identified. 3. Cholelithiasis without evidence of acute cholecystitis. 4. Moderate to large stool ball in the rectal vault (impaction?). Aortic Atherosclerosis (ICD10-I70.0). Electronically Signed   By: Franki Cabot M.D.   On: 04/23/2019 14:22   US Renal  Result Date: 04/23/2019 CLINICAL DATA:  Acute kidney injury.  Hydronephrosis. EXAM: RENAL / URINARY TRACT ULTRASOUND COMPLETE COMPARISON:  04/22/2018 FINDINGS: Right Kidney: Renal measurements: 9.5 x 5.1 x 4.4 cm = volume: 114 mL. No renal masses identified. Moderate to severe right hydronephrosis is seen which is increased since previous study. Left Kidney: Renal measurements: 8.0 x 3.6 x 4.5 cm = volume: 67 mL. No renal masses identified. Mild-to-moderate left hydronephrosis is seen, which is increased since previous study. Bladder: Urinary bladder is distended but otherwise unremarkable in appearance. IMPRESSION: Bilateral hydronephrosis which is increased since prior exam. Distended urinary bladder. Recommend clinical correlation for possible urinary retention. Electronically Signed   By: Marlaine Hind M.D.   On: 04/23/2019 10:04    Lab Data:  CBC: Recent Labs  Lab 04/21/19 1711 04/21/19 1717 04/22/19 0540 04/23/19 0224 04/24/19 0506  WBC 7.4  --  10.1 14.4* 12.0*  NEUTROABS 3.5  --   --   --   --   HGB 10.0* 11.6* 9.9* 10.4* 9.2*  HCT 34.0* 34.0* 33.3* 34.9* 31.3*  MCV 100.0  --  97.9 98.9 100.0  PLT 216  --  220 175 0000000   Basic Metabolic Panel: Recent Labs  Lab 04/21/19 1711 04/21/19 1717 04/21/19 1944 04/22/19 0540 04/23/19 0224 04/24/19 0506  NA 140 141  --  140 140 144  K 6.0* 5.9*  --  5.4* 5.0 4.5  CL 114* 113*  --  113* 113* 115*   CO2 20*  --   --  22 20* 21*  GLUCOSE 123* 119*  --  188* 141* 113*  BUN 36* 36*  --  34* 37* 38*  CREATININE 2.33* 2.10*  --  1.90* 2.38* 2.53*  CALCIUM 8.1*  --   --  8.1* 8.0* 7.8*  MG  --   --  2.3 2.0  --   --   PHOS  --   --  4.2 4.2  --   --    GFR: Estimated Creatinine Clearance: 19.8 mL/min (A) (by C-G formula based on SCr of 2.53 mg/dL (H)). Liver Function Tests: Recent Labs  Lab 04/21/19 1944 04/22/19 0540  AST 28 22  ALT 30 27  ALKPHOS 202* 192*  BILITOT 0.2* 0.2*  PROT 7.2 7.0  ALBUMIN 3.8 3.8   No results for input(s): LIPASE, AMYLASE in the last 168 hours. No results for input(s): AMMONIA in the last 168 hours. Coagulation Profile: No results for input(s): INR, PROTIME in the last 168 hours. Cardiac Enzymes: Recent Labs  Lab 04/21/19 1944  CKTOTAL 82   BNP (last 3 results) No results for input(s): PROBNP in the last 8760 hours. HbA1C: Recent Labs    04/21/19 1711  HGBA1C 5.7*   CBG: Recent Labs  Lab 04/24/19 0021 04/24/19 0408 04/24/19 0422 04/24/19 0828 04/24/19 1212  GLUCAP 102* 83 103* 131* 148*   Lipid Profile: No results for input(s): CHOL, HDL, LDLCALC, TRIG, CHOLHDL, LDLDIRECT in the last 72 hours. Thyroid Function Tests: Recent Labs    04/22/19 0540  TSH 2.246   Anemia Panel: Recent Labs    04/21/19 1944 04/21/19 1945  VITAMINB12  --  608  FOLATE  --  6.4  FERRITIN  --  27  TIBC  --  237*  IRON  --  60  RETICCTPCT 1.1  --    Urine analysis:    Component Value Date/Time   COLORURINE STRAW (A) 04/21/2019 1944   APPEARANCEUR HAZY (A) 04/21/2019 1944   LABSPEC 1.012 04/21/2019 1944   PHURINE 7.0 04/21/2019 1944   GLUCOSEU NEGATIVE 04/21/2019 1944   HGBUR SMALL (A) 04/21/2019 1944   BILIRUBINUR NEGATIVE 04/21/2019 1944   KETONESUR NEGATIVE 04/21/2019 1944   PROTEINUR 30 (A) 04/21/2019 1944   UROBILINOGEN 0.2 03/03/2010 0808   NITRITE POSITIVE (A) 04/21/2019 1944   LEUKOCYTESUR LARGE (A) 04/21/2019 1944      Stephanie Olson M.D. [facility] 04/24/2019, 2:11 PM  Pager: [phone] Between 7am to 7pm - call Pager - [phone]  After 7pm go to [email or web address] - password [email or web address]  Call night coverage person covering after 7pm

## 2019-04-25 LAB — GLUCOSE, CAPILLARY
Glucose-Capillary: 117 mg/dL — ABNORMAL HIGH (ref 70–99)
Glucose-Capillary: 123 mg/dL — ABNORMAL HIGH (ref 70–99)
Glucose-Capillary: 124 mg/dL — ABNORMAL HIGH (ref 70–99)
Glucose-Capillary: 125 mg/dL — ABNORMAL HIGH (ref 70–99)
Glucose-Capillary: 95 mg/dL (ref 70–99)
Glucose-Capillary: 98 mg/dL (ref 70–99)

## 2019-04-25 LAB — BASIC METABOLIC PANEL
Anion gap: 5 (ref 5–15)
BUN: 39 mg/dL — ABNORMAL HIGH (ref 8–23)
CO2: 20 mmol/L — ABNORMAL LOW (ref 22–32)
Calcium: 7.2 mg/dL — ABNORMAL LOW (ref 8.9–10.3)
Chloride: 116 mmol/L — ABNORMAL HIGH (ref 98–111)
Creatinine, Ser: 1.96 mg/dL — ABNORMAL HIGH (ref 0.44–1.00)
GFR calc Af Amer: 28 mL/min — ABNORMAL LOW (ref >60)
GFR calc non Af Amer: 24 mL/min — ABNORMAL LOW (ref >60)
Glucose, Bld: 117 mg/dL — ABNORMAL HIGH (ref 70–99)
Potassium: 4.6 mmol/L (ref 3.5–5.1)
Sodium: 141 mmol/L (ref 135–145)

## 2019-04-25 LAB — CBC
HCT: 26.4 % — ABNORMAL LOW (ref 36.0–46.0)
Hemoglobin: 7.6 g/dL — ABNORMAL LOW (ref 12.0–15.0)
MCH: 29 pg (ref 26.0–34.0)
MCHC: 28.8 g/dL — ABNORMAL LOW (ref 30.0–36.0)
MCV: 100.8 fL — ABNORMAL HIGH (ref 80.0–100.0)
Platelets: 140 10*3/uL — ABNORMAL LOW (ref 150–400)
RBC: 2.62 MIL/uL — ABNORMAL LOW (ref 3.87–5.11)
RDW: 16.3 % — ABNORMAL HIGH (ref 11.5–15.5)
WBC: 8 10*3/uL (ref 4.0–10.5)
nRBC: 0 % (ref 0.0–0.2)

## 2019-04-25 NOTE — Plan of Care (Signed)
  Problem: Health Behavior/Discharge Planning: Goal: Ability to manage health-related needs will improve Outcome: Progressing   Problem: Clinical Measurements: Goal: Ability to maintain clinical measurements within normal limits will improve Outcome: Progressing Goal: Will remain free from infection Outcome: Progressing Goal: Diagnostic test results will improve Outcome: Progressing   Problem: Activity: Goal: Risk for activity intolerance will decrease Outcome: Progressing   Problem: Nutrition: Goal: Adequate nutrition will be maintained Outcome: Progressing   Problem: Elimination: Goal: Will not experience complications related to bowel motility Outcome: Progressing Goal: Will not experience complications related to urinary retention Outcome: Progressing   Problem: Safety: Goal: Ability to remain free from injury will improve Outcome: Progressing   Problem: Skin Integrity: Goal: Risk for impaired skin integrity will decrease Outcome: Progressing

## 2019-04-25 NOTE — Progress Notes (Signed)
[facility]                                                                              Patient Demographics  Stephanie Olson, is a 78 y.o. female, DOB - Sep 14, 1940, [id number]  Admit date - 04/21/2019   Admitting Physician Toy Baker, MD  Outpatient Primary MD for the patient is Benito Mccreedy, MD  Outpatient specialists:   LOS - 3  days   Medical records reviewed and are as summarized below:    No chief complaint on file.      Brief summary   Patient is 78 year old female with diabetes, PVD with stent in right lower extremity, hypertension, failure to thrive, hyperkalemia, presented with abnormal labs from the nursing facility.  Patient otherwise did not complain of any abdominal pain nausea vomiting or diarrhea. Patient was treated with IV fluids and Lokelma 10 g x 1 Patient was also found to have UTI BMET showed creatinine of 2.3, potassium 6.0 with non-anion gap acidosis.  Patient was admitted for further work-up.  Assessment & Plan    Principal Problem:   AKI (acute kidney injury) ([place]) on CKD stage IV, hyperkalemia, non-anion gap acidosis - Presented with creatinine of 2.3, potassium 6.0, non-anion gap acidosis.  No uremia, making urine.  Likely worsened due to poor oral intake, acute UTI, worsening of CKD stage IV, obstructive uropathy -Patient was placed on IV fluid hydration, nephrology was consulted.   - Renal ultrasound showed bilateral hydronephrosis which is increased since prior exam, distended urinary bladder -Seen by urology Dr. Tresa Moore, recommended Foley catheter and keep current Foley at discharge.  Outpatient follow-up with Dr. Gloriann Loan in about 2 weeks. -Recommended continue current antibiotics for UTI/cystitis. -Urology felt to have poor emptying of the bladder, diabetes as predisposing factors for the cystitis -Creatinine trended up to 2.5 on 8/7, IV fluids were increased, now improving to 1.9 today   Active Problems: Acute  Pseudomonas UTI, complicated -DC IV Rocephin, placed on IV cefepime, for 7 days for complicated UTI, change to oral ciprofloxacin at the time of discharge.     PVD (peripheral vascular disease) ([place]) -Currently stable    Diabetic neuropathy ([place]), Right Diabetic ulcer of foot, limited to breakdown of skin ([place]) - Right foot wound, pressure injury 1x 2cm dark purple non-blanchable -Wound care consulted  -Recommended silicone foam dressing to the right and left heel and right lateral foot to protect, Prevalon boot in place for right lower extremity    Essential hypertension -BP soft but stable Blood cultures negative till date    Normocytic anemia likely anemia of chronic disease -Anemia panel consistent with anemia of chronic disease -H&H close to baseline    Hyperkalemia, NAG metabolic acidosis -Likely due to AKI on CKD stage IV, presented with K 6.0.  Patient received Lokelma x2 -Continue sodium bicarb, potassium improved 4.6 today  Diabetes mellitus type 2 -Hemoglobin A1c 5.7, continue sliding scale insulin  Generalized debility, failure to thrive PT OT evaluation recommended skilled nursing facility  Hyperlipidemia Continue Pravachol  Code Status: Full CODE STATUS DVT Prophylaxis: Lovenox Family Communication: called and updated labs, imaging and management to patient's son,  Lonzo Cloud     Disposition Plan: Remains inpatient today, continue IV fluids, possibly DC to skilled nursing facility in a.m. if creatinine continues to improve   Time Spent in minutes 25 minutes  Procedures:  Renal ultrasound Foley catheter  Consultants:   Nephrology urology  Antimicrobials:   Anti-infectives (From admission, onward)   Start     Dose/Rate Route Frequency Ordered Stop   04/24/19 1600  ceFEPIme (MAXIPIME) 2 g in sodium chloride 0.9 % 100 mL IVPB     2 g 200 mL/hr over 30 Minutes Intravenous Every 24 hours 04/24/19 1408     04/21/19 2200  cefTRIAXone (ROCEPHIN) 1 g  in sodium chloride 0.9 % 100 mL IVPB  Status:  Discontinued     1 g 200 mL/hr over 30 Minutes Intravenous Every 24 hours 04/21/19 2159 04/24/19 1408         Medications  Scheduled Meds:  aspirin  81 mg Oral Daily   DULoxetine  20 mg Oral Daily   enoxaparin (LOVENOX) injection  30 mg Subcutaneous Q24H   feeding supplement (NEPRO CARB STEADY)  237 mL Oral Q24H   feeding supplement (PRO-STAT SUGAR FREE 64)  30 mL Oral BID   insulin aspart  0-9 Units Subcutaneous Q4H   mirtazapine  7.5 mg Oral QHS   pravastatin  10 mg Oral QHS   senna  1 tablet Oral BID   sodium bicarbonate  650 mg Oral BID   tamsulosin  0.4 mg Oral Daily   Continuous Infusions:  sodium chloride 75 mL/hr at 04/25/19 0948   ceFEPime (MAXIPIME) IV 2 g (04/24/19 1607)   PRN Meds:.acetaminophen **OR** acetaminophen, hydrALAZINE, HYDROcodone-acetaminophen, ondansetron **OR** ondansetron (ZOFRAN) IV      Subjective:   Stephanie Olson was seen and examined today.  Alert and oriented, overall improving.  No fevers, no nausea or vomiting.  Denies any pain. No chest pain, dizziness, shortness of breath, nausea or vomiting.   Objective:   Vitals:   04/24/19 1959 04/24/19 2344 04/25/19 0417 04/25/19 0823  BP: 120/73 113/71 107/68 118/84  Pulse: 100 90 78 72  Resp: 16 18 16    Temp: 98.6 F (37 C) 98.4 F (36.9 C) 97.6 F (36.4 C) 98.5 F (36.9 C)  TempSrc: Oral Oral Oral Oral  SpO2: 94% 95% 96% 96%  Weight:      Height:        Intake/Output Summary (Last 24 hours) at 04/25/2019 1318 Last data filed at 04/25/2019 1100 Gross per 24 hour  Intake 2843.17 ml  Output 1050 ml  Net 1793.17 ml     Wt Readings from Last 3 Encounters:  04/22/19 71.8 kg  04/27/18 62.3 kg  11/21/15 85.3 kg   Physical Exam  General: Alert and oriented x2, NAD  Eyes:   HEENT:  Atraumatic, normocephalic  Cardiovascular: S1 S2 clear,  RRR. No pedal edema b/l  Respiratory: CTAB, no wheezing, rales or  rhonchi  Gastrointestinal: Soft, nontender, nondistended, NBS  Ext: RLE in heel protector   Neuro: no new deficits  Musculoskeletal: No cyanosis, clubbing  Skin: Right foot wound, pressure injury 1x 2cm dark purple non-blanchable   Psych: Normal affect and demeanor, alert and oriented x2    Data Reviewed:  I have personally reviewed following labs and imaging studies  Micro Results Recent Results (from the past 240 hour(s))  Urine Culture     Status: Abnormal   Collection Time: 04/21/19  7:44 PM   Specimen: Urine, Clean Catch  Result Value  Ref Range Status   Specimen Description   Final    URINE, CLEAN CATCH Performed at [facility], [place] [address] [place], [place] [address]    Special Requests   Final    Normal Performed at [facility], [place] [address], [place], [place] [address]    Culture 70,000 COLONIES/mL PSEUDOMONAS AERUGINOSA (A)  Final   Report Status 04/24/2019 FINAL  Final   Organism ID, Bacteria PSEUDOMONAS AERUGINOSA (A)  Final      Susceptibility   Pseudomonas aeruginosa - MIC*    CEFTAZIDIME 4 SENSITIVE Sensitive     CIPROFLOXACIN <=0.25 SENSITIVE Sensitive     GENTAMICIN <=1 SENSITIVE Sensitive     IMIPENEM 1 SENSITIVE Sensitive     CEFEPIME <=1 SENSITIVE Sensitive     * 70,000 COLONIES/mL PSEUDOMONAS AERUGINOSA  SARS Coronavirus 2 [facility] order, Performed in [facility] hospital lab) Nasopharyngeal Nasopharyngeal Swab     Status: None   Collection Time: 04/21/19  8:18 PM   Specimen: Nasopharyngeal Swab  Result Value Ref Range Status   SARS Coronavirus 2 NEGATIVE NEGATIVE Final    Comment: (NOTE) If result is NEGATIVE SARS-CoV-2 target nucleic acids are NOT DETECTED. The SARS-CoV-2 RNA is generally detectable in upper and lower  respiratory specimens during the acute phase of infection. The lowest  concentration of SARS-CoV-2 viral copies this assay can detect is 250  copies / mL. A negative result does not  preclude SARS-CoV-2 infection  and should not be used as the sole basis for treatment or other  patient management decisions.  A negative result may occur with  improper specimen collection / handling, submission of specimen other  than nasopharyngeal swab, presence of viral mutation(s) within the  areas targeted by this assay, and inadequate number of viral copies  (<250 copies / mL). A negative result must be combined with clinical  observations, patient history, and epidemiological information. If result is POSITIVE SARS-CoV-2 target nucleic acids are DETECTED. The SARS-CoV-2 RNA is generally detectable in upper and lower  respiratory specimens dur ing the acute phase of infection.  Positive  results are indicative of active infection with SARS-CoV-2.  Clinical  correlation with patient history and other diagnostic information is  necessary to determine patient infection status.  Positive results do  not rule out bacterial infection or co-infection with other viruses. If result is PRESUMPTIVE POSTIVE SARS-CoV-2 nucleic acids MAY BE PRESENT.   A presumptive positive result was obtained on the submitted specimen  and confirmed on repeat testing.  While 2019 novel coronavirus  (SARS-CoV-2) nucleic acids may be present in the submitted sample  additional confirmatory testing may be necessary for epidemiological  and / or clinical management purposes  to differentiate between  SARS-CoV-2 and other Sarbecovirus currently known to infect humans.  If clinically indicated additional testing with an alternate test  methodology [phone]) is advised. The SARS-CoV-2 RNA is generally  detectable in upper and lower respiratory sp ecimens during the acute  phase of infection. The expected result is Negative. Fact Sheet for Patients:  StrictlyIdeas.no Fact Sheet for Healthcare Providers: BankingDealers.co.za This test is not yet approved or cleared by  the [place] FDA and has been authorized for detection and/or diagnosis of SARS-CoV-2 by FDA under an Emergency Use Authorization (EUA).  This EUA will remain in effect (meaning this test can be used) for the duration of the COVID-19 declaration under Section 564(b)(1) of the Act, 21 U.S.C. section 360bbb-3(b)(1), unless the authorization is terminated  or revoked sooner. Performed at [facility], [place] [address] [place], [place] [address]   SARS CORONAVIRUS 2 Nasal Swab Aptima Multi Swab     Status: None   Collection Time: 04/24/19 12:50 PM   Specimen: Aptima Multi Swab; Nasal Swab  Result Value Ref Range Status   SARS Coronavirus 2 NEGATIVE NEGATIVE Final    Comment: (NOTE) SARS-CoV-2 target nucleic acids are NOT DETECTED. The SARS-CoV-2 RNA is generally detectable in upper and lower respiratory specimens during the acute phase of infection. Negative results do not preclude SARS-CoV-2 infection, do not rule out co-infections with other pathogens, and should not be used as the sole basis for treatment or other patient management decisions. Negative results must be combined with clinical observations, patient history, and epidemiological information. The expected result is Negative. Fact Sheet for Patients: SugarRoll.be Fact Sheet for Healthcare Providers: https://www.woods-mathews.com/ This test is not yet approved or cleared by the [place] FDA and  has been authorized for detection and/or diagnosis of SARS-CoV-2 by FDA under an Emergency Use Authorization (EUA). This EUA will remain  in effect (meaning this test can be used) for the duration of the COVID-19 declaration under Section 56 4(b)(1) of the Act, 21 U.S.C. section 360bbb-3(b)(1), unless the authorization is terminated or revoked sooner. Performed at [facility], [place] [address], [place],  [place] [address]   Culture, blood (routine x 2)      Status: None (Preliminary result)   Collection Time: 04/24/19  2:37 PM   Specimen: BLOOD  Result Value Ref Range Status   Specimen Description   Final    BLOOD RIGHT ANTECUBITAL Performed at [facility], [place] [address], [place], [place] [address]    Special Requests   Final    BOTTLES DRAWN AEROBIC AND ANAEROBIC Blood Culture adequate volume Performed at [place] [address], [place], [place] [address]    Culture   Final    NO GROWTH < 12 HOURS Performed at [place] [address], [place], [place] [address]    Report Status PENDING  Incomplete  Culture, blood (routine x 2)     Status: None (Preliminary result)   Collection Time: 04/24/19  2:42 PM   Specimen: BLOOD RIGHT HAND  Result Value Ref Range Status   Specimen Description   Final    BLOOD RIGHT HAND Performed at [place] [address], [place], [place] [address]    Special Requests   Final    BOTTLES DRAWN AEROBIC ONLY Blood Culture adequate volume Performed at [place] [address] [place], [place] [address]    Culture   Final    NO GROWTH < 12 HOURS Performed at [place] [address], [place], [place] [address]    Report Status PENDING  Incomplete    Radiology Reports Ct Abdomen Pelvis Wo Contrast  Result Date: 04/23/2019 CLINICAL DATA:  Acute kidney injury with bilateral hydronephrosis and distended urinary bladder. EXAM: CT ABDOMEN AND PELVIS WITHOUT CONTRAST TECHNIQUE: Multidetector CT imaging of the abdomen and pelvis was performed following the standard protocol without IV contrast. COMPARISON:  Renal ultrasound from earlier same day. CT abdomen dated 04/22/2018. FINDINGS: Lower chest: Patchy consolidation at the LEFT lung base, most likely atelectasis. Hepatobiliary: Layering stones within the moderately distended gallbladder. No focal liver abnormality. No bile duct dilatation seen. Pancreas:  Unremarkable. No pancreatic ductal dilatation or surrounding inflammatory changes. Spleen: Normal in  size without focal abnormality. Adrenals/Urinary Tract: Adrenal glands again appear bulbous bilaterally but without discrete nodule or mass. Small LEFT renal cyst, upper pole. Mild bilateral hydronephrosis, as demonstrated on today's renal ultrasound, decreased in severity compared to the earlier CT abdomen of 04/22/2018. No ureteral calculi identified. Bladder is now decompressed by Foley catheter. Stomach/Bowel: Moderate size stool ball in the rectal vault. No dilated large or small bowel loops. No evidence of bowel wall inflammation seen. Appendix is normal. Stomach is unremarkable, partially decompressed. Vascular/Lymphatic: Aortic atherosclerosis. No enlarged lymph nodes seen. Reproductive: Uterus and bilateral adnexa are unremarkable. Other: Small amount of ascites within the abdomen and pelvis. No abscess collection identified. No free intraperitoneal air. Musculoskeletal: No acute or suspicious osseous finding. Degenerative spondylosis of the lumbar spine, mild to moderate in degree. IMPRESSION: 1. Mild bilateral hydronephrosis, as demonstrated on today's renal ultrasound, decreased in severity compared to the earlier CT abdomen of 04/22/2018. No ureteral calculi identified. Bladder is now decompressed by Foley catheter. 2. Small amount of ascites within the abdomen and pelvis. No abscess collection identified. 3. Cholelithiasis without evidence of acute cholecystitis. 4. Moderate to large stool ball in the rectal vault (impaction?). Aortic Atherosclerosis (ICD10-I70.0). Electronically Signed   By: Franki Cabot M.D.   On: 04/23/2019 14:22   US Renal  Result Date: 04/23/2019 CLINICAL DATA:  Acute kidney injury.  Hydronephrosis. EXAM: RENAL / URINARY TRACT ULTRASOUND COMPLETE COMPARISON:  04/22/2018 FINDINGS: Right Kidney: Renal measurements: 9.5 x 5.1 x 4.4 cm = volume: 114 mL. No renal masses identified.  Moderate to severe right hydronephrosis is seen which is increased since previous study. Left Kidney: Renal measurements: 8.0 x 3.6 x 4.5 cm = volume: 67 mL. No renal masses identified. Mild-to-moderate left hydronephrosis is seen, which is increased since previous study. Bladder: Urinary bladder is distended but otherwise unremarkable in appearance. IMPRESSION: Bilateral hydronephrosis which is increased since prior exam. Distended urinary bladder. Recommend clinical correlation for possible urinary retention. Electronically Signed   By: Marlaine Hind M.D.   On: 04/23/2019 10:04    Lab Data:  CBC: Recent Labs  Lab 04/21/19 1711 04/21/19 1717 04/22/19 0540 04/23/19 0224 04/24/19 0506 04/25/19 0502  WBC 7.4  --  10.1 14.4* 12.0* 8.0  NEUTROABS 3.5  --   --   --   --   --   HGB 10.0* 11.6* 9.9* 10.4* 9.2* 7.6*  HCT 34.0* 34.0* 33.3* 34.9* 31.3* 26.4*  MCV 100.0  --  97.9 98.9 100.0 100.8*  PLT 216  --  220 175 195 [id number]*   Basic Metabolic Panel: Recent Labs  Lab 04/21/19 1711 04/21/19 1717 04/21/19 1944 04/22/19 0540 04/23/19 0224 04/24/19 0506 04/25/19 0502  NA 140 141  --  140 140 144 141  K 6.0* 5.9*  --  5.4* 5.0 4.5 4.6  CL 114* 113*  --  113* 113* 115* 116*  CO2 20*  --   --  22 20* 21* 20*  GLUCOSE 123* 119*  --  188* 141* 113* 117*  BUN 36* 36*  --  34* 37* 38* 39*  CREATININE 2.33* 2.10*  --  1.90* 2.38* 2.53* 1.96*  CALCIUM 8.1*  --   --  8.1* 8.0* 7.8* 7.2*  MG  --   --  2.3 2.0  --   --   --   PHOS  --   --  4.2 4.2  --   --   --    GFR: Estimated Creatinine Clearance: 25.5 mL/min (A) (by  C-G formula based on SCr of 1.96 mg/dL (H)). Liver Function Tests: Recent Labs  Lab 04/21/19 1944 04/22/19 0540  AST 28 22  ALT 30 27  ALKPHOS 202* 192*  BILITOT 0.2* 0.2*  PROT 7.2 7.0  ALBUMIN 3.8 3.8   No results for input(s): LIPASE, AMYLASE in the last 168 hours. No results for input(s): AMMONIA in the last 168 hours. Coagulation Profile: No results for  input(s): INR, PROTIME in the last 168 hours. Cardiac Enzymes: Recent Labs  Lab 04/21/19 1944  CKTOTAL 82   BNP (last 3 results) No results for input(s): PROBNP in the last 8760 hours. HbA1C: No results for input(s): HGBA1C in the last 72 hours. CBG: Recent Labs  Lab 04/24/19 2018 04/24/19 2340 04/25/19 0344 04/25/19 0732 04/25/19 1124  GLUCAP 153* 106* 117* 95 125*   Lipid Profile: No results for input(s): CHOL, HDL, LDLCALC, TRIG, CHOLHDL, LDLDIRECT in the last 72 hours. Thyroid Function Tests: No results for input(s): TSH, T4TOTAL, FREET4, T3FREE, THYROIDAB in the last 72 hours. Anemia Panel: No results for input(s): VITAMINB12, FOLATE, FERRITIN, TIBC, IRON, RETICCTPCT in the last 72 hours. Urine analysis:    Component Value Date/Time   COLORURINE STRAW (A) 04/21/2019 1944   APPEARANCEUR HAZY (A) 04/21/2019 1944   LABSPEC 1.012 04/21/2019 1944   PHURINE 7.0 04/21/2019 1944   GLUCOSEU NEGATIVE 04/21/2019 1944   HGBUR SMALL (A) 04/21/2019 1944   BILIRUBINUR NEGATIVE 04/21/2019 1944   KETONESUR NEGATIVE 04/21/2019 1944   PROTEINUR 30 (A) 04/21/2019 1944   UROBILINOGEN 0.2 03/03/2010 0808   NITRITE POSITIVE (A) 04/21/2019 1944   LEUKOCYTESUR LARGE (A) 04/21/2019 1944     Willer Osorno M.D. [facility] 04/25/2019, 1:18 PM  Pager: [phone] Between 7am to 7pm - call Pager - [phone]  After 7pm go to [email or web address] - password [email or web address]  Call night coverage person covering after 7pm

## 2019-04-25 NOTE — Care Management Important Message (Signed)
Important Message  Patient Details IM Letter given to Dessa Phi RN to present to the Patient Name: Stephanie Olson MRN: [id number] Date of Birth: 1940/12/29   Medicare Important Message Given:  Yes     Kerin Salen 04/25/2019, 11:36 AM

## 2019-04-25 NOTE — Progress Notes (Signed)
Physical Therapy Treatment Patient Details Name: Stephanie Olson MRN: [id number] DOB: 07/07/1941 Today's Date: 04/25/2019    History of Present Illness 78 y.o. female with medical history significant of DM2 with diabetic ulcer, peripheral vascular disease status post angioplasty with stent on the Right LE , HTN. Admitted from [facility] with hyperkalemia    PT Comments    Pt making slow progress, but did agree to short bout of gait outside of room into hallway today.  Pt reports no pain in R foot during gait.  She continues to require min A overall for mobility and recommend continued skilled care in acute and also via SNF for continued therapy to address remaining deficits.     Follow Up Recommendations  SNF;Supervision/Assistance - 24 hour;Supervision for mobility/OOB     Equipment Recommendations  Other (comment)    Recommendations for Other Services       Precautions / Restrictions Precautions Precautions: Fall Precaution Comments: Has deep tissue injury on R heel/lateral foot Restrictions Weight Bearing Restrictions: No    Mobility  Bed Mobility Overal bed mobility: Modified Independent Bed Mobility: Supine to Sit     Supine to sit: HOB elevated;Modified independent (Device/Increase time)     General bed mobility comments: HOB elevated however pt did not need to use bed rails for sitting up to EOB.  Transfers Overall transfer level: Needs assistance Equipment used: Rolling walker (2 wheeled) Transfers: Sit to/from Stand Sit to Stand: Min assist         General transfer comment: Min A for forward weight shift with cues for hand placement and improving LE power to stand fully upright.  Ambulation/Gait Ambulation/Gait assistance: Min assist Gait Distance (Feet): 15 Feet Assistive device: Rolling walker (2 wheeled) Gait Pattern/deviations: Step-through pattern;Decreased stride length;Trunk flexed Gait velocity: decreased   General Gait Details: Pt only  agreeable to ambulate from bed to right outside of room into hallway then turn back to come to recliner.  Cues for posture and safe use of RW throughout.   Stairs             Wheelchair Mobility    Modified Rankin (Stroke Patients Only)       Balance Overall balance assessment: Needs assistance Sitting-balance support: Feet supported Sitting balance-Leahy Scale: Fair Sitting balance - Comments: Pt able to sit at EOB x 2 mins to finish coffee with feet unsupported and no overt LOB   Standing balance support: Bilateral upper extremity supported;During functional activity Standing balance-Leahy Scale: Poor Standing balance comment: Requires UE support throughout.                            Cognition Arousal/Alertness: Awake/alert Behavior During Therapy: WFL for tasks assessed/performed Overall Cognitive Status: No family/caregiver present to determine baseline cognitive functioning                                 General Comments: Able to follow 1 step commands, oriented to self and month, year.  Seems to have some memory/processing deficits during session, but able to continue with session.      Exercises      General Comments        Pertinent Vitals/Pain      Home Living                      Prior Function  PT Goals (current goals can now be found in the care plan section) Acute Rehab PT Goals Patient Stated Goal: none stated PT Goal Formulation: Patient unable to participate in goal setting Time For Goal Achievement: 05/06/19 Potential to Achieve Goals: Good Progress towards PT goals: Progressing toward goals    Frequency    Min 3X/week      PT Plan Current plan remains appropriate    Co-evaluation              AM-PAC PT "6 Clicks" Mobility   Outcome Measure  Help needed turning from your back to your side while in a flat bed without using bedrails?: None Help needed moving from lying on  your back to sitting on the side of a flat bed without using bedrails?: A Little Help needed moving to and from a bed to a chair (including a wheelchair)?: A Little Help needed standing up from a chair using your arms (e.g., wheelchair or bedside chair)?: A Little Help needed to walk in hospital room?: A Little Help needed climbing 3-5 steps with a railing? : Total 6 Click Score: 17    End of Session Equipment Utilized During Treatment: Gait belt Activity Tolerance: Patient tolerated treatment well;Patient limited by fatigue Patient left: in chair;with chair alarm set;with call bell/phone within reach Nurse Communication: Mobility status PT Visit Diagnosis: Unsteadiness on feet (R26.81);Difficulty in walking, not elsewhere classified (R26.2)     Time: [id number] PT Time Calculation (min) (ACUTE ONLY): 20 min  Charges:  $Gait Training: 8-22 mins                     Cameron Sprang, PT, MPT  04/25/19, 11:14 AM    Almyra Birman, Betha Loa 04/25/2019, 11:13 AM

## 2019-04-26 LAB — BASIC METABOLIC PANEL
Anion gap: 4 — ABNORMAL LOW (ref 5–15)
BUN: 34 mg/dL — ABNORMAL HIGH (ref 8–23)
CO2: 19 mmol/L — ABNORMAL LOW (ref 22–32)
Calcium: 7.4 mg/dL — ABNORMAL LOW (ref 8.9–10.3)
Chloride: 118 mmol/L — ABNORMAL HIGH (ref 98–111)
Creatinine, Ser: 1.79 mg/dL — ABNORMAL HIGH (ref 0.44–1.00)
GFR calc Af Amer: 31 mL/min — ABNORMAL LOW (ref >60)
GFR calc non Af Amer: 27 mL/min — ABNORMAL LOW (ref >60)
Glucose, Bld: 94 mg/dL (ref 70–99)
Potassium: 4.6 mmol/L (ref 3.5–5.1)
Sodium: 141 mmol/L (ref 135–145)

## 2019-04-26 LAB — CBC
HCT: 26.5 % — ABNORMAL LOW (ref 36.0–46.0)
Hemoglobin: 8 g/dL — ABNORMAL LOW (ref 12.0–15.0)
MCH: 29.4 pg (ref 26.0–34.0)
MCHC: 30.2 g/dL (ref 30.0–36.0)
MCV: 97.4 fL (ref 80.0–100.0)
Platelets: 129 10*3/uL — ABNORMAL LOW (ref 150–400)
RBC: 2.72 MIL/uL — ABNORMAL LOW (ref 3.87–5.11)
RDW: 16.1 % — ABNORMAL HIGH (ref 11.5–15.5)
WBC: 8.3 10*3/uL (ref 4.0–10.5)
nRBC: 0 % (ref 0.0–0.2)

## 2019-04-26 LAB — GLUCOSE, CAPILLARY
Glucose-Capillary: 88 mg/dL (ref 70–99)
Glucose-Capillary: 93 mg/dL (ref 70–99)
Glucose-Capillary: 110 mg/dL — ABNORMAL HIGH (ref 70–99)

## 2019-04-26 MED ORDER — CIPROFLOXACIN HCL 500 MG PO TABS: 500.0000 mg | ORAL_TABLET | Freq: Two times a day (BID) | ORAL | 0 refills | Status: AC

## 2019-04-26 MED ORDER — HYDROCODONE-ACETAMINOPHEN 5-325 MG PO TABS: 1.0000 | ORAL_TABLET | Freq: Four times a day (QID) | ORAL | 0 refills | Status: AC | PRN

## 2019-04-26 MED ORDER — POLYETHYLENE GLYCOL 3350 17 G PO PACK
17.0000 g | PACK | Freq: Once | ORAL | Status: AC
  Administered 2019-04-26: 17 g via ORAL
  Filled 2019-04-26: qty 1

## 2019-04-26 NOTE — TOC Progression Note (Signed)
Transition of Care [facility]) - Progression Note    Patient Details  Name: Stephanie Olson MRN: [id number] Date of Birth: 10-07-1940  Transition of Care [facility]) CM/SW Contact  Nakari Bracknell, Juliann Pulse, RN Phone Number: 04/26/2019, 12:12 PM  Clinical Narrative:  TC to [facility] for 2p pick up. No further CM needs.     Expected Discharge Plan: [place] Barriers to Discharge: No Barriers Identified  Expected Discharge Plan and Services Expected Discharge Plan: [place] In-house Referral: Clinical Social Work   Post Acute Care Choice: [place] Living arrangements for the past 2 months: [place] Expected Discharge Date: 04/26/19                                     Social Determinants of Health (SDOH) Interventions    Readmission Risk Interventions No flowsheet data found.

## 2019-04-26 NOTE — Discharge Summary (Signed)
Physician Discharge Summary  Stephanie Olson [id number] DOB: September 07, 1941 DOA: 04/21/2019  PCP: Benito Mccreedy, MD  Admit date: 04/21/2019 Discharge date: 04/26/2019  Admitted From: Inpatient Disposition: SNF  Recommendations for Outpatient Follow-up:  1. Follow up with PCP in 1-2 weeks 2. Please obtain BMP/CBC in one week  Home Health:No Equipment/Devices: None Discharge Condition:Stable CODE STATUS:Full code Diet recommendation: ADA Brief/Interim Summary: Patient is 78 year old female with diabetes, PVD with stent in right lower extremity, hypertension, failure to thrive, hyperkalemia, presented with abnormal labs from the nursing facility.  Patient otherwise did not complain of any abdominal pain nausea vomiting or diarrhea. Patient was treated with IV fluids and Lokelma 10 g x 1 Patient was also found to have UTI BMET showed creatinine of 2.3, potassium 6.0 with non-anion gap acidosis.  Patient was admitted for further work-up.  Hospital course: Patient present with acute kidney injury in the setting of CKD stage IV with hyperkalemia.  This was multifactorial consistent with poor p.o. intake, acute urinary tract infection.  Renal ultrasounds obtained nephrology was consulted as well as urology.  Patient had a renal ultrasound which showed bilateral hydronephrosis with recommendations for continuing Foley and discharge and following up with Dr. Gloriann Loan an outpatient setting about approximately 2 weeks.  Recommended continuing antibiotics for current UTI.  Renal function did improve will need close monitoring as an outpatient.   As part of patient's hospital course she was found to have a Pseudomonas UTI.  She was placed on Rocephin initially and then cefepime.  She continue Cipro as an outpatient for an additional 5 days. Patient was also found to have bacteremia which was consistent with a contaminant with coag negative.  Repeat blood cultures are negative.  Patient stable and  afebrile no further work-up necessary. CKD patient will need close monitoring of her outpatient electrolytes as well as acid-base status.  She received bicarb while in the hospital will discontinue on discharge but low threshold for reinitiating with close follow-up with labs as an outpatient. Diabetes mellitus type 2 hemoglobin A1c is on to be 5.7 patient on sliding scale insulin while in the hospital.  Monitor blood sugar as an outpatient to return if patient is truly diabetic reviewed medications and not reporting current diabetic meds.  Patient will benefit from a diabetic diet on discharge.  Discharge Diagnoses:  Principal Problem:   AKI (acute kidney injury) ([place]) Active Problems:   PVD (peripheral vascular disease) ([place])   Diabetic neuropathy ([place])   Diabetic ulcer of foot, limited to breakdown of skin ([place])   Essential hypertension   Normocytic anemia   Hyperkalemia   Metabolic acidosis   Acute lower UTI    Discharge Instructions  Discharge Instructions    Call MD for:   Complete by: As directed    Any acute change in medical condition   Diet - low sodium heart healthy   Complete by: As directed    Increase activity slowly   Complete by: As directed      SEE ATTACHED MED REC  No Known Allergies  Consultations:  UROLOGY   NEPHROLOGY   Procedures/Studies: Ct Abdomen Pelvis Wo Contrast  Result Date: 04/23/2019 CLINICAL DATA:  Acute kidney injury with bilateral hydronephrosis and distended urinary bladder. EXAM: CT ABDOMEN AND PELVIS WITHOUT CONTRAST TECHNIQUE: Multidetector CT imaging of the abdomen and pelvis was performed following the standard protocol without IV contrast. COMPARISON:  Renal ultrasound from earlier same day. CT abdomen dated 04/22/2018. FINDINGS: Lower chest: Patchy consolidation at the LEFT  lung base, most likely atelectasis. Hepatobiliary: Layering stones within the moderately distended gallbladder. No focal liver abnormality. No bile duct  dilatation seen. Pancreas: Unremarkable. No pancreatic ductal dilatation or surrounding inflammatory changes. Spleen: Normal in size without focal abnormality. Adrenals/Urinary Tract: Adrenal glands again appear bulbous bilaterally but without discrete nodule or mass. Small LEFT renal cyst, upper pole. Mild bilateral hydronephrosis, as demonstrated on today's renal ultrasound, decreased in severity compared to the earlier CT abdomen of 04/22/2018. No ureteral calculi identified. Bladder is now decompressed by Foley catheter. Stomach/Bowel: Moderate size stool ball in the rectal vault. No dilated large or small bowel loops. No evidence of bowel wall inflammation seen. Appendix is normal. Stomach is unremarkable, partially decompressed. Vascular/Lymphatic: Aortic atherosclerosis. No enlarged lymph nodes seen. Reproductive: Uterus and bilateral adnexa are unremarkable. Other: Small amount of ascites within the abdomen and pelvis. No abscess collection identified. No free intraperitoneal air. Musculoskeletal: No acute or suspicious osseous finding. Degenerative spondylosis of the lumbar spine, mild to moderate in degree. IMPRESSION: 1. Mild bilateral hydronephrosis, as demonstrated on today's renal ultrasound, decreased in severity compared to the earlier CT abdomen of 04/22/2018. No ureteral calculi identified. Bladder is now decompressed by Foley catheter. 2. Small amount of ascites within the abdomen and pelvis. No abscess collection identified. 3. Cholelithiasis without evidence of acute cholecystitis. 4. Moderate to large stool ball in the rectal vault (impaction?). Aortic Atherosclerosis (ICD10-I70.0). Electronically Signed   By: Franki Cabot M.D.   On: 04/23/2019 14:22   US Renal  Result Date: 04/23/2019 CLINICAL DATA:  Acute kidney injury.  Hydronephrosis. EXAM: RENAL / URINARY TRACT ULTRASOUND COMPLETE COMPARISON:  04/22/2018 FINDINGS: Right Kidney: Renal measurements: 9.5 x 5.1 x 4.4 cm = volume: 114 mL.  No renal masses identified. Moderate to severe right hydronephrosis is seen which is increased since previous study. Left Kidney: Renal measurements: 8.0 x 3.6 x 4.5 cm = volume: 67 mL. No renal masses identified. Mild-to-moderate left hydronephrosis is seen, which is increased since previous study. Bladder: Urinary bladder is distended but otherwise unremarkable in appearance. IMPRESSION: Bilateral hydronephrosis which is increased since prior exam. Distended urinary bladder. Recommend clinical correlation for possible urinary retention. Electronically Signed   By: Marlaine Hind M.D.   On: 04/23/2019 10:04       Subjective: No acute events Comfortably in bed ready for discharge to skilled nursing facility  Discharge Exam: Vitals:   04/25/19 2146 04/26/19 0438  BP: 119/71 120/73  Pulse: 91 78  Resp: 18 17  Temp: 99.1 F (37.3 C) 98.9 F (37.2 C)  SpO2: 95% 95%   Vitals:   04/25/19 1332 04/25/19 1427 04/25/19 2146 04/26/19 0438  BP: 127/76  119/71 120/73  Pulse: 90  91 78  Resp: 16  18 17   Temp: 98.4 F (36.9 C)  99.1 F (37.3 C) 98.9 F (37.2 C)  TempSrc: Oral  Oral Oral  SpO2: 96% 99% 95% 95%  Weight:      Height:        General: Pt is alert, awake, not in acute distress Cardiovascular: RRR, S1/S2 +, no rubs, no gallops Respiratory: CTA bilaterally, no wheezing, no rhonchi Abdominal: Soft, NT, ND, bowel sounds + Extremities: no edema, no cyanosis    The results of significant diagnostics from this hospitalization (including imaging, microbiology, ancillary and laboratory) are listed below for reference.     Microbiology: Recent Results (from the past 240 hour(s))  Urine Culture     Status: Abnormal   Collection Time: 04/21/19  7:44 PM   Specimen: Urine, Clean Catch  Result Value Ref Range Status   Specimen Description   Final    URINE, CLEAN CATCH Performed at [facility], [place] [address], [place], [place] [address]    Special Requests    Final    Normal Performed at [facility], [place] [address] [place], [place] [address]    Culture 70,000 COLONIES/mL PSEUDOMONAS AERUGINOSA (A)  Final   Report Status 04/24/2019 FINAL  Final   Organism ID, Bacteria PSEUDOMONAS AERUGINOSA (A)  Final      Susceptibility   Pseudomonas aeruginosa - MIC*    CEFTAZIDIME 4 SENSITIVE Sensitive     CIPROFLOXACIN <=0.25 SENSITIVE Sensitive     GENTAMICIN <=1 SENSITIVE Sensitive     IMIPENEM 1 SENSITIVE Sensitive     CEFEPIME <=1 SENSITIVE Sensitive     * 70,000 COLONIES/mL PSEUDOMONAS AERUGINOSA  SARS Coronavirus 2 [facility] order, Performed in [facility] hospital lab) Nasopharyngeal Nasopharyngeal Swab     Status: None   Collection Time: 04/21/19  8:18 PM   Specimen: Nasopharyngeal Swab  Result Value Ref Range Status   SARS Coronavirus 2 NEGATIVE NEGATIVE Final    Comment: (NOTE) If result is NEGATIVE SARS-CoV-2 target nucleic acids are NOT DETECTED. The SARS-CoV-2 RNA is generally detectable in upper and lower  respiratory specimens during the acute phase of infection. The lowest  concentration of SARS-CoV-2 viral copies this assay can detect is 250  copies / mL. A negative result does not preclude SARS-CoV-2 infection  and should not be used as the sole basis for treatment or other  patient management decisions.  A negative result may occur with  improper specimen collection / handling, submission of specimen other  than nasopharyngeal swab, presence of viral mutation(s) within the  areas targeted by this assay, and inadequate number of viral copies  (<250 copies / mL). A negative result must be combined with clinical  observations, patient history, and epidemiological information. If result is POSITIVE SARS-CoV-2 target nucleic acids are DETECTED. The SARS-CoV-2 RNA is generally detectable in upper and lower  respiratory specimens dur ing the acute phase of infection.  Positive  results are indicative of active  infection with SARS-CoV-2.  Clinical  correlation with patient history and other diagnostic information is  necessary to determine patient infection status.  Positive results do  not rule out bacterial infection or co-infection with other viruses. If result is PRESUMPTIVE POSTIVE SARS-CoV-2 nucleic acids MAY BE PRESENT.   A presumptive positive result was obtained on the submitted specimen  and confirmed on repeat testing.  While 2019 novel coronavirus  (SARS-CoV-2) nucleic acids may be present in the submitted sample  additional confirmatory testing may be necessary for epidemiological  and / or clinical management purposes  to differentiate between  SARS-CoV-2 and other Sarbecovirus currently known to infect humans.  If clinically indicated additional testing with an alternate test  methodology [phone]) is advised. The SARS-CoV-2 RNA is generally  detectable in upper and lower respiratory sp ecimens during the acute  phase of infection. The expected result is Negative. Fact Sheet for Patients:  StrictlyIdeas.no Fact Sheet for Healthcare Providers: BankingDealers.co.za This test is not yet approved or cleared by the [place] FDA and has been authorized for detection and/or diagnosis of SARS-CoV-2 by FDA under an Emergency Use Authorization (EUA).  This EUA will remain in effect (meaning this test can be used) for the duration of the COVID-19 declaration under Section 564(b)(1) of  the Act, 21 U.S.C. section 360bbb-3(b)(1), unless the authorization is terminated or revoked sooner. Performed at [facility], [place] [address], [place], [place] [address]   SARS CORONAVIRUS 2 Nasal Swab Aptima Multi Swab     Status: None   Collection Time: 04/24/19 12:50 PM   Specimen: Aptima Multi Swab; Nasal Swab  Result Value Ref Range Status   SARS Coronavirus 2 NEGATIVE NEGATIVE Final    Comment: (NOTE) SARS-CoV-2 target nucleic  acids are NOT DETECTED. The SARS-CoV-2 RNA is generally detectable in upper and lower respiratory specimens during the acute phase of infection. Negative results do not preclude SARS-CoV-2 infection, do not rule out co-infections with other pathogens, and should not be used as the sole basis for treatment or other patient management decisions. Negative results must be combined with clinical observations, patient history, and epidemiological information. The expected result is Negative. Fact Sheet for Patients: SugarRoll.be Fact Sheet for Healthcare Providers: https://www.woods-mathews.com/ This test is not yet approved or cleared by the [place] FDA and  has been authorized for detection and/or diagnosis of SARS-CoV-2 by FDA under an Emergency Use Authorization (EUA). This EUA will remain  in effect (meaning this test can be used) for the duration of the COVID-19 declaration under Section 56 4(b)(1) of the Act, 21 U.S.C. section 360bbb-3(b)(1), unless the authorization is terminated or revoked sooner. Performed at [facility], [place] [address], [place], [place] [address]   Culture, blood (routine x 2)     Status: None (Preliminary result)   Collection Time: 04/24/19  2:37 PM   Specimen: BLOOD  Result Value Ref Range Status   Specimen Description   Final    BLOOD RIGHT ANTECUBITAL Performed at [facility], [place] [address], [place], [place] [address]    Special Requests   Final    BOTTLES DRAWN AEROBIC AND ANAEROBIC Blood Culture adequate volume Performed at [place] [address], [place], [place] [address]    Culture   Final    NO GROWTH 2 DAYS Performed at [facility] [address], [place], [place] [address]    Report Status PENDING  Incomplete  Culture, blood (routine x 2)     Status: None (Preliminary result)   Collection Time: 04/24/19  2:42 PM   Specimen: BLOOD RIGHT HAND   Result Value Ref Range Status   Specimen Description   Final    BLOOD RIGHT HAND Performed at [place] [address] [place], [place] [address]    Special Requests   Final    BOTTLES DRAWN AEROBIC ONLY Blood Culture adequate volume Performed at [place] [address], [place], [place] [address]    Culture   Final    NO GROWTH 2 DAYS Performed at [place] [address] [place],  [address]    Report Status PENDING  Incomplete     Labs: BNP (last 3 results) No results for input(s): BNP in the last 8760 hours. Basic Metabolic Panel: Recent Labs  Lab 04/21/19 1944 04/22/19 0540 04/23/19 0224 04/24/19 0506 04/25/19 0502 04/26/19 0503  NA  --  140 140 144 141 141  K  --  5.4* 5.0 4.5 4.6 4.6  CL  --  113* 113* 115* 116* 118*  CO2  --  22 20* 21* 20* 19*  GLUCOSE  --  188* 141* 113* 117* 94  BUN  --  34* 37* 38* 39* 34*  CREATININE  --  1.90* 2.38* 2.53* 1.96* 1.79*  CALCIUM  --  8.1* 8.0* 7.8* 7.2* 7.4*  MG 2.3 2.0  --   --   --   --   PHOS 4.2 4.2  --   --   --   --    Liver Function Tests: Recent Labs  Lab 04/21/19 1944 04/22/19 0540  AST 28 22  ALT 30 27  ALKPHOS 202* 192*  BILITOT 0.2* 0.2*  PROT 7.2 7.0  ALBUMIN 3.8 3.8   No results for input(s): LIPASE, AMYLASE in the last 168 hours. No results for input(s): AMMONIA in the last 168 hours. CBC: Recent Labs  Lab 04/21/19 1711  04/22/19 0540 04/23/19 0224 04/24/19 0506 04/25/19 0502 04/26/19 0503  WBC 7.4  --  10.1 14.4* 12.0* 8.0 8.3  NEUTROABS 3.5  --   --   --   --   --   --   HGB 10.0*   < > 9.9* 10.4* 9.2* 7.6* 8.0*  HCT 34.0*   < > 33.3* 34.9* 31.3* 26.4* 26.5*  MCV 100.0  --  97.9 98.9 100.0 100.8* 97.4  PLT 216  --  220 175 195 140* 129*   < > = values in this interval not displayed.   Cardiac Enzymes: Recent Labs  Lab 04/21/19 1944  CKTOTAL 82   BNP: Invalid input(s): POCBNP CBG: Recent Labs  Lab 04/25/19 2031  04/25/19 2343 04/26/19 0439 04/26/19 0745 04/26/19 1132  GLUCAP 123* 98 88 93 110*   D-Dimer No results for input(s): DDIMER in the last 72 hours. Hgb A1c No results for input(s): HGBA1C in the last 72 hours. Lipid Profile No results for input(s): CHOL, HDL, LDLCALC, TRIG, CHOLHDL, LDLDIRECT in the last 72 hours. Thyroid function studies No results for input(s): TSH, T4TOTAL, T3FREE, THYROIDAB in the last 72 hours.  Invalid input(s): FREET3 Anemia work up No results for input(s): VITAMINB12, FOLATE, FERRITIN, TIBC, IRON, RETICCTPCT in the last 72 hours. Urinalysis    Component Value Date/Time   COLORURINE STRAW (A) 04/21/2019 1944   APPEARANCEUR HAZY (A) 04/21/2019 1944   LABSPEC 1.012 04/21/2019 1944   PHURINE 7.0 04/21/2019 1944   GLUCOSEU NEGATIVE 04/21/2019 1944   HGBUR SMALL (A) 04/21/2019 1944   BILIRUBINUR NEGATIVE 04/21/2019 1944   KETONESUR NEGATIVE 04/21/2019 1944   PROTEINUR 30 (A) 04/21/2019 1944   UROBILINOGEN 0.2 03/03/2010 0808   NITRITE POSITIVE (A) 04/21/2019 1944   LEUKOCYTESUR LARGE (A) 04/21/2019 1944   Sepsis Labs Invalid input(s): PROCALCITONIN,  WBC,  LACTICIDVEN Microbiology Recent Results (from the past 240 hour(s))  Urine Culture     Status: Abnormal   Collection Time: 04/21/19  7:44 PM   Specimen: Urine, Clean Catch  Result Value Ref Range Status   Specimen Description   Final    URINE, CLEAN CATCH Performed at [facility], [place] [address], [place], [place] [address]    Special Requests   Final    Normal Performed at [facility] [place] [address], [place], [place] [address]    Culture 70,000 COLONIES/mL PSEUDOMONAS AERUGINOSA (A)  Final   Report Status 04/24/2019 FINAL  Final   Organism ID, Bacteria PSEUDOMONAS AERUGINOSA (A)  Final      Susceptibility   Pseudomonas aeruginosa - MIC*    CEFTAZIDIME 4 SENSITIVE Sensitive     CIPROFLOXACIN <=0.25 SENSITIVE Sensitive     GENTAMICIN <=1 SENSITIVE  Sensitive     IMIPENEM 1 SENSITIVE Sensitive  CEFEPIME <=1 SENSITIVE Sensitive     * 70,000 COLONIES/mL PSEUDOMONAS AERUGINOSA  SARS Coronavirus 2 [facility] order, Performed in [facility] hospital lab) Nasopharyngeal Nasopharyngeal Swab     Status: None   Collection Time: 04/21/19  8:18 PM   Specimen: Nasopharyngeal Swab  Result Value Ref Range Status   SARS Coronavirus 2 NEGATIVE NEGATIVE Final    Comment: (NOTE) If result is NEGATIVE SARS-CoV-2 target nucleic acids are NOT DETECTED. The SARS-CoV-2 RNA is generally detectable in upper and lower  respiratory specimens during the acute phase of infection. The lowest  concentration of SARS-CoV-2 viral copies this assay can detect is 250  copies / mL. A negative result does not preclude SARS-CoV-2 infection  and should not be used as the sole basis for treatment or other  patient management decisions.  A negative result may occur with  improper specimen collection / handling, submission of specimen other  than nasopharyngeal swab, presence of viral mutation(s) within the  areas targeted by this assay, and inadequate number of viral copies  (<250 copies / mL). A negative result must be combined with clinical  observations, patient history, and epidemiological information. If result is POSITIVE SARS-CoV-2 target nucleic acids are DETECTED. The SARS-CoV-2 RNA is generally detectable in upper and lower  respiratory specimens dur ing the acute phase of infection.  Positive  results are indicative of active infection with SARS-CoV-2.  Clinical  correlation with patient history and other diagnostic information is  necessary to determine patient infection status.  Positive results do  not rule out bacterial infection or co-infection with other viruses. If result is PRESUMPTIVE POSTIVE SARS-CoV-2 nucleic acids MAY BE PRESENT.   A presumptive positive result was obtained on the submitted specimen  and confirmed on repeat testing.  While  2019 novel coronavirus  (SARS-CoV-2) nucleic acids may be present in the submitted sample  additional confirmatory testing may be necessary for epidemiological  and / or clinical management purposes  to differentiate between  SARS-CoV-2 and other Sarbecovirus currently known to infect humans.  If clinically indicated additional testing with an alternate test  methodology [phone]) is advised. The SARS-CoV-2 RNA is generally  detectable in upper and lower respiratory sp ecimens during the acute  phase of infection. The expected result is Negative. Fact Sheet for Patients:  StrictlyIdeas.no Fact Sheet for Healthcare Providers: BankingDealers.co.za This test is not yet approved or cleared by the [place] FDA and has been authorized for detection and/or diagnosis of SARS-CoV-2 by FDA under an Emergency Use Authorization (EUA).  This EUA will remain in effect (meaning this test can be used) for the duration of the COVID-19 declaration under Section 564(b)(1) of the Act, 21 U.S.C. section 360bbb-3(b)(1), unless the authorization is terminated or revoked sooner. Performed at [facility], [place] [address] [place], [place] [address]   SARS CORONAVIRUS 2 Nasal Swab Aptima Multi Swab     Status: None   Collection Time: 04/24/19 12:50 PM   Specimen: Aptima Multi Swab; Nasal Swab  Result Value Ref Range Status   SARS Coronavirus 2 NEGATIVE NEGATIVE Final    Comment: (NOTE) SARS-CoV-2 target nucleic acids are NOT DETECTED. The SARS-CoV-2 RNA is generally detectable in upper and lower respiratory specimens during the acute phase of infection. Negative results do not preclude SARS-CoV-2 infection, do not rule out co-infections with other pathogens, and should not be used as the sole basis for treatment or other patient management decisions. Negative results must be combined with clinical observations, patient  history, and  epidemiological information. The expected result is Negative. Fact Sheet for Patients: SugarRoll.be Fact Sheet for Healthcare Providers: https://www.woods-mathews.com/ This test is not yet approved or cleared by the [place] FDA and  has been authorized for detection and/or diagnosis of SARS-CoV-2 by FDA under an Emergency Use Authorization (EUA). This EUA will remain  in effect (meaning this test can be used) for the duration of the COVID-19 declaration under Section 56 4(b)(1) of the Act, 21 U.S.C. section 360bbb-3(b)(1), unless the authorization is terminated or revoked sooner. Performed at [facility], [place] [address], [place], [place] [address]   Culture, blood (routine x 2)     Status: None (Preliminary result)   Collection Time: 04/24/19  2:37 PM   Specimen: BLOOD  Result Value Ref Range Status   Specimen Description   Final    BLOOD RIGHT ANTECUBITAL Performed at [facility], [place] [address] [place], [place] [address]    Special Requests   Final    BOTTLES DRAWN AEROBIC AND ANAEROBIC Blood Culture adequate volume Performed at [place] [address] [place], [place] [address]    Culture   Final    NO GROWTH 2 DAYS Performed at [place] [address], [place], [place] [address]    Report Status PENDING  Incomplete  Culture, blood (routine x 2)     Status: None (Preliminary result)   Collection Time: 04/24/19  2:42 PM   Specimen: BLOOD RIGHT HAND  Result Value Ref Range Status   Specimen Description   Final    BLOOD RIGHT HAND Performed at [place] [address], [place], [place] [address]    Special Requests   Final    BOTTLES DRAWN AEROBIC ONLY Blood Culture adequate volume Performed at [facility] [address] [place], [place] [address]    Culture   Final    NO GROWTH 2 DAYS Performed at [place] [address] [place],  [address]    Report Status PENDING  Incomplete     Time coordinating discharge: Over 30 minutes  SIGNED:   Nicolette Bang, MD  [facility] 04/26/2019, 11:54 AM Pager   If 7PM-7AM, please contact night-coverage [email or web address] Password [email or web address]

## 2019-04-26 NOTE — TOC Transition Note (Signed)
Transition of Care [facility]) - CM/SW Discharge Note   Patient Details  Name: Stephanie Olson MRN: [id number] Date of Birth: 10/04/1940  Transition of Care [facility]) CM/SW Contact:  Dessa Phi, RN Phone Number: 04/26/2019, 11:37 AM   Clinical Narrative: for d/c back to [facility](LTC) today-TC son Jerome-aware of d/c back-TC [facility] aware of d/c back able to receive patient.covd neg 8/17-going to rm 1204p, nurse to call report [phone]. Will send d/c summary once available, & call [facility] for non emergency transport.Nsg/MD updated.      Final next level of care: Skilled Nursing Facility Barriers to Discharge: No Barriers Identified   Patient Goals and CMS Choice Patient states their goals for this hospitalization and ongoing recovery are:: to have pt dc back to [facility]   Choice offered to / list presented to : Adult Children  Discharge Placement                Patient to be transferred to facility by: [place] Name of family member notified: [name] son Patient and family notified of of transfer: 04/26/19  Discharge Plan and Services In-house Referral: Clinical Social Work   Post Acute Care Choice: [place]                               Social Determinants of Health (SDOH) Interventions     Readmission Risk Interventions No flowsheet data found.

## 2019-04-29 LAB — CULTURE, BLOOD (ROUTINE X 2)
Culture: NO GROWTH
Culture: NO GROWTH
Special Requests: ADEQUATE
Special Requests: ADEQUATE

## 2019-05-10 ENCOUNTER — Other Ambulatory Visit: Payer: Self-pay

## 2019-05-10 ENCOUNTER — Encounter (HOSPITAL_COMMUNITY): Payer: Self-pay

## 2019-05-10 ENCOUNTER — Inpatient Hospital Stay (HOSPITAL_COMMUNITY)
Admission: EM | Admit: 2019-05-10 | Discharge: 2019-05-14 | DRG: 641 | Disposition: A | Discharge status: Skilled Nursing Facility | Payer: Medicare Other | Attending: Internal Medicine | Admitting: Internal Medicine

## 2019-05-10 DIAGNOSIS — I129 Hypertensive chronic kidney disease with stage 1 through stage 4 chronic kidney disease, or unspecified chronic kidney disease: Secondary | ICD-10-CM | POA: Diagnosis present

## 2019-05-10 DIAGNOSIS — L8990 Pressure ulcer of unspecified site, unspecified stage: Secondary | ICD-10-CM | POA: Diagnosis present

## 2019-05-10 DIAGNOSIS — R627 Adult failure to thrive: Secondary | ICD-10-CM | POA: Diagnosis present

## 2019-05-10 DIAGNOSIS — N179 Acute kidney failure, unspecified: Secondary | ICD-10-CM

## 2019-05-10 DIAGNOSIS — E872 Acidosis, unspecified: Secondary | ICD-10-CM

## 2019-05-10 DIAGNOSIS — E875 Hyperkalemia: Secondary | ICD-10-CM | POA: Diagnosis not present

## 2019-05-10 DIAGNOSIS — E119 Type 2 diabetes mellitus without complications: Secondary | ICD-10-CM

## 2019-05-10 DIAGNOSIS — Z7982 Long term (current) use of aspirin: Secondary | ICD-10-CM

## 2019-05-10 DIAGNOSIS — L89616 Pressure-induced deep tissue damage of right heel: Secondary | ICD-10-CM | POA: Diagnosis present

## 2019-05-10 DIAGNOSIS — E440 Moderate protein-calorie malnutrition: Secondary | ICD-10-CM

## 2019-05-10 DIAGNOSIS — D638 Anemia in other chronic diseases classified elsewhere: Secondary | ICD-10-CM | POA: Diagnosis not present

## 2019-05-10 DIAGNOSIS — Z8673 Personal history of transient ischemic attack (TIA), and cerebral infarction without residual deficits: Secondary | ICD-10-CM

## 2019-05-10 DIAGNOSIS — N184 Chronic kidney disease, stage 4 (severe): Secondary | ICD-10-CM | POA: Diagnosis present

## 2019-05-10 DIAGNOSIS — Z8249 Family history of ischemic heart disease and other diseases of the circulatory system: Secondary | ICD-10-CM

## 2019-05-10 DIAGNOSIS — R338 Other retention of urine: Secondary | ICD-10-CM

## 2019-05-10 DIAGNOSIS — E1122 Type 2 diabetes mellitus with diabetic chronic kidney disease: Secondary | ICD-10-CM | POA: Diagnosis present

## 2019-05-10 DIAGNOSIS — D631 Anemia in chronic kidney disease: Secondary | ICD-10-CM | POA: Diagnosis present

## 2019-05-10 DIAGNOSIS — Z79899 Other long term (current) drug therapy: Secondary | ICD-10-CM

## 2019-05-10 DIAGNOSIS — F17210 Nicotine dependence, cigarettes, uncomplicated: Secondary | ICD-10-CM | POA: Diagnosis present

## 2019-05-10 DIAGNOSIS — E2740 Unspecified adrenocortical insufficiency: Secondary | ICD-10-CM | POA: Diagnosis present

## 2019-05-10 DIAGNOSIS — Z993 Dependence on wheelchair: Secondary | ICD-10-CM

## 2019-05-10 DIAGNOSIS — E1140 Type 2 diabetes mellitus with diabetic neuropathy, unspecified: Secondary | ICD-10-CM | POA: Diagnosis present

## 2019-05-10 DIAGNOSIS — E1151 Type 2 diabetes mellitus with diabetic peripheral angiopathy without gangrene: Secondary | ICD-10-CM | POA: Diagnosis present

## 2019-05-10 DIAGNOSIS — Z20828 Contact with and (suspected) exposure to other viral communicable diseases: Secondary | ICD-10-CM | POA: Diagnosis present

## 2019-05-10 LAB — BASIC METABOLIC PANEL
Anion gap: 5 (ref 5–15)
Anion gap: 8 (ref 5–15)
BUN: 37 mg/dL — ABNORMAL HIGH (ref 8–23)
BUN: 35 mg/dL — ABNORMAL HIGH (ref 8–23)
CO2: 17 mmol/L — ABNORMAL LOW (ref 22–32)
CO2: 17 mmol/L — ABNORMAL LOW (ref 22–32)
Calcium: 8.2 mg/dL — ABNORMAL LOW (ref 8.9–10.3)
Calcium: 8.7 mg/dL — ABNORMAL LOW (ref 8.9–10.3)
Chloride: 113 mmol/L — ABNORMAL HIGH (ref 98–111)
Chloride: 113 mmol/L — ABNORMAL HIGH (ref 98–111)
Creatinine, Ser: 2.17 mg/dL — ABNORMAL HIGH (ref 0.44–1.00)
Creatinine, Ser: 2.15 mg/dL — ABNORMAL HIGH (ref 0.44–1.00)
GFR calc Af Amer: 25 mL/min — ABNORMAL LOW (ref >60)
GFR calc Af Amer: 25 mL/min — ABNORMAL LOW (ref >60)
GFR calc non Af Amer: 21 mL/min — ABNORMAL LOW (ref >60)
GFR calc non Af Amer: 22 mL/min — ABNORMAL LOW (ref >60)
Glucose, Bld: 91 mg/dL (ref 70–99)
Glucose, Bld: 105 mg/dL — ABNORMAL HIGH (ref 70–99)
Potassium: 6.6 mmol/L (ref 3.5–5.1)
Potassium: 6.1 mmol/L — ABNORMAL HIGH (ref 3.5–5.1)
Sodium: 135 mmol/L (ref 135–145)
Sodium: 138 mmol/L (ref 135–145)

## 2019-05-10 LAB — URINALYSIS, ROUTINE W REFLEX MICROSCOPIC
Bilirubin Urine: NEGATIVE
Glucose, UA: NEGATIVE mg/dL
Hgb urine dipstick: NEGATIVE
Ketones, ur: NEGATIVE mg/dL
Nitrite: NEGATIVE
Protein, ur: 30 mg/dL — AB
Specific Gravity, Urine: 1.01 (ref 1.005–1.030)
pH: 6 (ref 5.0–8.0)

## 2019-05-10 LAB — CBC WITH DIFFERENTIAL/PLATELET
Abs Immature Granulocytes: 0.02 10*3/uL (ref 0.00–0.07)
Basophils Absolute: 0.1 10*3/uL (ref 0.0–0.1)
Basophils Relative: 1 %
Eosinophils Absolute: 0.1 10*3/uL (ref 0.0–0.5)
Eosinophils Relative: 1 %
HCT: 30.3 % — ABNORMAL LOW (ref 36.0–46.0)
Hemoglobin: 9 g/dL — ABNORMAL LOW (ref 12.0–15.0)
Immature Granulocytes: 0 %
Lymphocytes Relative: 45 %
Lymphs Abs: 3.9 10*3/uL (ref 0.7–4.0)
MCH: 29.3 pg (ref 26.0–34.0)
MCHC: 29.7 g/dL — ABNORMAL LOW (ref 30.0–36.0)
MCV: 98.7 fL (ref 80.0–100.0)
Monocytes Absolute: 0.6 10*3/uL (ref 0.1–1.0)
Monocytes Relative: 7 %
Neutro Abs: 4.1 10*3/uL (ref 1.7–7.7)
Neutrophils Relative %: 46 %
Platelets: 339 10*3/uL (ref 150–400)
RBC: 3.07 MIL/uL — ABNORMAL LOW (ref 3.87–5.11)
RDW: 16.6 % — ABNORMAL HIGH (ref 11.5–15.5)
WBC: 8.8 10*3/uL (ref 4.0–10.5)
nRBC: 0 % (ref 0.0–0.2)

## 2019-05-10 LAB — CBC
HCT: 32.8 % — ABNORMAL LOW (ref 36.0–46.0)
Hemoglobin: 9.6 g/dL — ABNORMAL LOW (ref 12.0–15.0)
MCH: 29.3 pg (ref 26.0–34.0)
MCHC: 29.3 g/dL — ABNORMAL LOW (ref 30.0–36.0)
MCV: 100 fL (ref 80.0–100.0)
Platelets: 357 10*3/uL (ref 150–400)
RBC: 3.28 MIL/uL — ABNORMAL LOW (ref 3.87–5.11)
RDW: 16.5 % — ABNORMAL HIGH (ref 11.5–15.5)
WBC: 9.9 10*3/uL (ref 4.0–10.5)
nRBC: 0 % (ref 0.0–0.2)

## 2019-05-10 LAB — CREATININE, SERUM
Creatinine, Ser: 2.14 mg/dL — ABNORMAL HIGH (ref 0.44–1.00)
GFR calc Af Amer: 25 mL/min — ABNORMAL LOW (ref >60)
GFR calc non Af Amer: 22 mL/min — ABNORMAL LOW (ref >60)

## 2019-05-10 LAB — PHOSPHORUS: Phosphorus: 4.2 mg/dL (ref 2.5–4.6)

## 2019-05-10 LAB — SARS CORONAVIRUS 2 (TAT 6-24 HRS): SARS Coronavirus 2: NEGATIVE

## 2019-05-10 MED ORDER — TAMSULOSIN HCL 0.4 MG PO CAPS
0.4000 mg | ORAL_CAPSULE | Freq: Every day | ORAL | Status: DC
  Administered 2019-05-11 – 2019-05-14 (×4): 0.4 mg via ORAL
  Filled 2019-05-10 (×4): qty 1

## 2019-05-10 MED ORDER — ACETAMINOPHEN 650 MG RE SUPP: 650.0000 mg | Freq: Four times a day (QID) | RECTAL | Status: DC | PRN

## 2019-05-10 MED ORDER — DOCUSATE SODIUM 100 MG PO CAPS
100.0000 mg | ORAL_CAPSULE | Freq: Two times a day (BID) | ORAL | Status: DC
  Administered 2019-05-10 – 2019-05-14 (×8): 100 mg via ORAL
  Filled 2019-05-10 (×8): qty 1

## 2019-05-10 MED ORDER — BISACODYL 5 MG PO TBEC: 5.0000 mg | DELAYED_RELEASE_TABLET | Freq: Every day | ORAL | Status: DC | PRN

## 2019-05-10 MED ORDER — MEGESTROL ACETATE 400 MG/10ML PO SUSP
400.0000 mg | Freq: Every day | ORAL | Status: DC
  Administered 2019-05-11 – 2019-05-14 (×4): 400 mg via ORAL
  Filled 2019-05-10 (×4): qty 10

## 2019-05-10 MED ORDER — MELATONIN 3 MG PO TABS
3.0000 mg | ORAL_TABLET | Freq: Every day | ORAL | Status: DC
  Administered 2019-05-10 – 2019-05-13 (×4): 3 mg via ORAL
  Filled 2019-05-10 (×5): qty 1

## 2019-05-10 MED ORDER — HEPARIN SODIUM (PORCINE) 5000 UNIT/ML IJ SOLN
5000.0000 [IU] | Freq: Three times a day (TID) | INTRAMUSCULAR | Status: DC
  Administered 2019-05-10 – 2019-05-14 (×11): 5000 [IU] via SUBCUTANEOUS
  Filled 2019-05-10 (×11): qty 1

## 2019-05-10 MED ORDER — SODIUM ZIRCONIUM CYCLOSILICATE 5 G PO PACK
5.0000 g | PACK | Freq: Once | ORAL | Status: AC
  Administered 2019-05-10: 5 g via ORAL
  Filled 2019-05-10: qty 1

## 2019-05-10 MED ORDER — MIRTAZAPINE 15 MG PO TABS
7.5000 mg | ORAL_TABLET | Freq: Every day | ORAL | Status: DC
  Administered 2019-05-10 – 2019-05-13 (×4): 7.5 mg via ORAL
  Filled 2019-05-10 (×4): qty 1

## 2019-05-10 MED ORDER — POLYVINYL ALCOHOL 1.4 % OP SOLN
1.0000 [drp] | Freq: Two times a day (BID) | OPHTHALMIC | Status: DC
  Administered 2019-05-10 – 2019-05-14 (×8): 1 [drp] via OPHTHALMIC
  Filled 2019-05-10: qty 15

## 2019-05-10 MED ORDER — DEXTROSE 50 % IV SOLN
1.0000 | Freq: Once | INTRAVENOUS | Status: AC
  Administered 2019-05-10: 16:00:00 50 mL via INTRAVENOUS
  Filled 2019-05-10: qty 50

## 2019-05-10 MED ORDER — SENNOSIDES-DOCUSATE SODIUM 8.6-50 MG PO TABS: 1.0000 | ORAL_TABLET | Freq: Every evening | ORAL | Status: DC | PRN

## 2019-05-10 MED ORDER — SODIUM POLYSTYRENE SULFONATE 15 GM/60ML PO SUSP
15.0000 g | Freq: Once | ORAL | Status: AC
  Administered 2019-05-10: 15 g via ORAL
  Filled 2019-05-10: qty 60

## 2019-05-10 MED ORDER — ALBUTEROL SULFATE HFA 108 (90 BASE) MCG/ACT IN AERS
2.0000 | INHALATION_SPRAY | Freq: Once | RESPIRATORY_TRACT | Status: AC
  Administered 2019-05-10: 16:00:00 2 via RESPIRATORY_TRACT
  Filled 2019-05-10: qty 6.7

## 2019-05-10 MED ORDER — DULOXETINE HCL 20 MG PO CPEP
20.0000 mg | ORAL_CAPSULE | Freq: Every day | ORAL | Status: DC
  Administered 2019-05-11 – 2019-05-14 (×4): 20 mg via ORAL
  Filled 2019-05-10 (×4): qty 1

## 2019-05-10 MED ORDER — INSULIN ASPART 100 UNIT/ML IV SOLN
5.0000 [IU] | Freq: Once | INTRAVENOUS | Status: AC
  Administered 2019-05-10: 16:00:00 5 [IU] via INTRAVENOUS
  Filled 2019-05-10: qty 0.05

## 2019-05-10 MED ORDER — CALCIUM GLUCONATE-NACL 1-0.675 GM/50ML-% IV SOLN
1.0000 g | Freq: Once | INTRAVENOUS | Status: AC
  Administered 2019-05-10: 16:00:00 1000 mg via INTRAVENOUS
  Filled 2019-05-10: qty 50

## 2019-05-10 MED ORDER — HYDROCODONE-ACETAMINOPHEN 5-325 MG PO TABS
1.0000 | ORAL_TABLET | ORAL | Status: DC | PRN
  Administered 2019-05-10 – 2019-05-13 (×8): 1 via ORAL
  Filled 2019-05-10 (×8): qty 1

## 2019-05-10 MED ORDER — ACETAMINOPHEN 325 MG PO TABS: 650.0000 mg | ORAL_TABLET | Freq: Four times a day (QID) | ORAL | Status: DC | PRN

## 2019-05-10 MED ORDER — PRAVASTATIN SODIUM 20 MG PO TABS
10.0000 mg | ORAL_TABLET | Freq: Every day | ORAL | Status: DC
  Administered 2019-05-10 – 2019-05-13 (×4): 10 mg via ORAL
  Filled 2019-05-10 (×4): qty 1

## 2019-05-10 MED ORDER — SODIUM CHLORIDE 0.9 % IV SOLN: 1.0000 g | Freq: Once | INTRAVENOUS | Status: DC

## 2019-05-10 MED ORDER — ASPIRIN 81 MG PO CHEW
81.0000 mg | CHEWABLE_TABLET | Freq: Every day | ORAL | Status: DC
  Administered 2019-05-11 – 2019-05-14 (×4): 81 mg via ORAL
  Filled 2019-05-10 (×4): qty 1

## 2019-05-10 NOTE — ED Notes (Signed)
K+ 6.6, no visible hemolysis

## 2019-05-10 NOTE — ED Provider Notes (Signed)
[facility] DEPT Provider Note   CSN: [id number] Arrival date & time: 05/10/19  1148     History   Chief Complaint Chief Complaint  Patient presents with   Abnormal Lab    HPI Stephanie Olson is a 78 y.o. female.  Who is here with abnormal lab work.  Patient has multiple recent visits for hyperkalemia.  Most recently admitted couple weeks ago for hyperkalemia, treated with low, and recommended dietary changes at time of discharge.  Routine recheck today at her facility was concerning for potassium of 7.0, patient at this time denies any symptoms specifically no chest pain, difficulty breathing, lightheadedness, fever, cough.     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    Hypertension    Peripheral vascular disease ([place])     Patient Active Problem List   Diagnosis Date Noted   Acute lower UTI [id number]   Metabolic acidosis [id number]   Malnutrition of moderate degree 04/25/2018   Pressure injury of skin 04/23/2018   Acute renal failure ([place])    Urinary retention    AKI (acute kidney injury) ([place])    Renal failure (ARF), acute on chronic ([place]) 04/22/2018   Essential hypertension 04/22/2018   Normocytic anemia [id number]   Metabolic acidosis, normal anion gap (NAG) 04/22/2018   Hyperkalemia 04/22/2018   Aspiration into airway 04/22/2018   Generalized weakness 04/22/2018   Lethargy 04/22/2018   Poor appetite 04/22/2018   Failure to thrive in adult 04/22/2018   Diabetic neuropathy ([place]) 11/21/2015   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/2015   Diabetic ulcer of foot, limited to breakdown of skin ([place]) 11/21/2015   Foot pain, left 02/14/2015   PVD (peripheral vascular disease) ([place]) 01/29/2015    Past Surgical History:  Procedure Laterality Date   ABDOMINAL AORTAGRAM  01/29/2015   ATHERECTOMY Right 01/29/2015   FEMORAL ARTERY    BALLOON ANGIOPLASTY, ARTERY Right 01/29/2015   RT FEMORAL    FOOT SURGERY     PERIPHERAL VASCULAR CATHETERIZATION N/A 01/29/2015   Procedure: Abdominal Aortogram w/Lower Extremity;  Surgeon: Serafina Mitchell, MD;  Location: [facility];  Service: Cardiovascular;  Laterality: N/A;     OB History   No obstetric history on file.      Home Medications    Prior to Admission medications   Medication Sig Start Date End Date Taking? Authorizing Provider  acetaminophen (TYLENOL) 325 MG tablet Take 650 mg by mouth every 8 (eight) hours as needed for moderate pain or headache.   Yes [provider]  acetaminophen (TYLENOL) 500 MG tablet Take 500 mg by mouth 2 (two) times daily.    Yes [provider]  aspirin 81 MG chewable tablet Chew 81 mg by mouth daily.   Yes [provider]  bisacodyl (DULCOLAX) 10 MG suppository Place 1 suppository (10 mg total) rectally daily as needed for moderate constipation. 04/26/18  Yes Georgette Shell, MD  dicyclomine (BENTYL) 10 MG capsule Take 10 mg by mouth 2 (two) times daily. 03/17/19  Yes [provider]  DULoxetine (CYMBALTA) 20 MG capsule Take 20 mg by mouth daily. 04/16/19  Yes [provider]  HYDROcodone-acetaminophen (NORCO/VICODIN) 5-325 MG tablet Take 1 tablet by mouth every 4 (four) hours as needed for moderate pain.   Yes [provider]  loperamide (IMODIUM) 2 MG capsule Take 1 capsule (2 mg  total) by mouth every 6 (six) hours as needed for diarrhea or loose stools. 04/26/18  Yes Georgette Shell, MD  megestrol (MEGACE) 400 MG/10ML suspension Take 10 mLs (400 mg total) by mouth daily. 04/27/18  Yes Emokpae, Courage, MD  Melatonin 3 MG TABS Take 3 mg by mouth at bedtime.    Yes [provider]  mirtazapine (REMERON) 7.5 MG tablet Take 7.5 mg by mouth at bedtime.   Yes [provider]  ondansetron (ZOFRAN) 4 MG tablet Take 1 tablet (4 mg total) by mouth  every 6 (six) hours as needed for nausea. 04/26/18  Yes Georgette Shell, MD  polyvinyl alcohol (LIQUIFILM TEARS) 1.4 % ophthalmic solution Place 1 drop into both eyes 2 (two) times daily.   Yes [provider]  pravastatin (PRAVACHOL) 20 MG tablet Take 10 mg by mouth at bedtime. 04/05/19  Yes [provider]  senna (SENOKOT) 8.6 MG TABS tablet Take 1 tablet by mouth 2 (two) times daily.   Yes [provider]  SPS 15 GM/60ML suspension Take 60 mLs by mouth daily. 04/07/19  Yes [provider]  tamsulosin (FLOMAX) 0.4 MG CAPS capsule Take 0.4 mg by mouth daily. 04/06/19  Yes [provider]  trolamine salicylate (ASPERCREME) 10 % cream Apply 1 application topically daily. Right lateral ankle   Yes [provider]    Family History Family History  Problem Relation Age of Onset   Hypertension Mother    Hypertension Father     Social History Social History   Tobacco Use   Smoking status: Current Some Day Smoker    Types: Cigarettes    Last attempt to quit: 02/13/2014    Years since quitting: 5.2   Smokeless tobacco: Never Used   Tobacco comment: " I quit smoking along time ago "  Substance Use Topics   Alcohol use: No    Alcohol/week: 0.0 standard drinks   Drug use: No     Allergies   Patient has no known allergies.   Review of Systems Review of Systems  Constitutional: Negative for chills and fever.  HENT: Negative for ear pain and sore throat.   Eyes: Negative for pain and visual disturbance.  Respiratory: Negative for cough and shortness of breath.   Cardiovascular: Negative for chest pain and palpitations.  Gastrointestinal: Negative for abdominal pain and vomiting.  Genitourinary: Negative for dysuria and hematuria.  Musculoskeletal: Negative for arthralgias and back pain.  Skin: Negative for color change and rash.  Neurological: Negative for seizures and syncope.  All other systems reviewed and are  negative.    Physical Exam Updated Vital Signs BP 135/63    Pulse 72    Temp 99.6 F (37.6 C) (Oral)    Resp 12    Ht 5\' 9"  (1.753 m)    Wt 72 kg    SpO2 100%    BMI 23.44 kg/m   Physical Exam Vitals signs and nursing note reviewed.  Constitutional:      General: She is not in acute distress.    Appearance: She is well-developed.  HENT:     Head: Normocephalic and atraumatic.  Eyes:     Conjunctiva/sclera: Conjunctivae normal.  Neck:     Musculoskeletal: Neck supple.  Cardiovascular:     Rate and Rhythm: Normal rate and regular rhythm.     Heart sounds: No murmur.  Pulmonary:     Effort: Pulmonary effort is normal. No respiratory distress.     Breath sounds: Normal  breath sounds.  Abdominal:     Palpations: Abdomen is soft.     Tenderness: There is no abdominal tenderness.  Skin:    General: Skin is warm and dry.  Neurological:     Mental Status: She is alert.      ED Treatments / Results  Labs (all labs ordered are listed, but only abnormal results are displayed) Labs Reviewed  CBC WITH DIFFERENTIAL/PLATELET - Abnormal; Notable for the following components:      Result Value   RBC 3.07 (*)    Hemoglobin 9.0 (*)    HCT 30.3 (*)    MCHC 29.7 (*)    RDW 16.6 (*)    All other components within normal limits  BASIC METABOLIC PANEL - Abnormal; Notable for the following components:   Potassium 6.6 (*)    Chloride 113 (*)    CO2 17 (*)    BUN 37 (*)    Creatinine, Ser 2.17 (*)    Calcium 8.2 (*)    GFR calc non Af Amer 21 (*)    GFR calc Af Amer 25 (*)    All other components within normal limits  SARS CORONAVIRUS 2 (TAT 6-24 HRS)  URINALYSIS, ROUTINE W REFLEX MICROSCOPIC    EKG EKG Interpretation  Date/Time:  Wednesday May 10 2019 13:05:18 EDT Ventricular Rate:  68 PR Interval:    QRS Duration: 99 QT Interval:  383 QTC Calculation: 408 R Axis:   38 Text Interpretation:  Sinus rhythm Low voltage, precordial leads Partial missing lead(s): V5  normal qrs, normal qtc, no peaked t waves Confirmed by Madalyn Rob [phone]) on 05/10/2019 1:40:55 PM   Radiology No results found.  Procedures Procedures (including critical care time)  Medications Ordered in ED Medications  insulin aspart (novoLOG) injection 5 Units (has no administration in time range)    And  dextrose 50 % solution 50 mL (has no administration in time range)  albuterol (VENTOLIN HFA) 108 (90 Base) MCG/ACT inhaler 2 puff (has no administration in time range)  calcium gluconate 1 g/ 50 mL sodium chloride IVPB (has no administration in time range)  sodium polystyrene (KAYEXALATE) 15 GM/60ML suspension 15 g (has no administration in time range)  sodium zirconium cyclosilicate (LOKELMA) packet 5 g (has no administration in time range)     Initial Impression / Assessment and Plan / ED Course  I have reviewed the triage vital signs and the nursing notes.  Pertinent labs & imaging results that were available during my care of the patient were reviewed by me and considered in my medical decision making (see chart for details).  Clinical Course as of May 10 1515  Wed May 10, 2019  1456 Discussed with nephrology, recommends Lokelma, Kayexalate and extensive diet education   [RD]  1456 Discussed with hospitalist who agrees for admission   [RD]    Clinical Course User Index [RD] Lucrezia Starch, MD       78 year old lady who presented to the ER with elevated potassium.  Asymptomatic.  EKG without changes.  Gave albuterol, insulin with D50, calcium.  Discussed case with nephrology, recommends Lokelma, Kayexalate, admission.  Discussed with hospitalist Dr. Cyndia Skeeters who agrees to admit patient.  Final Clinical Impressions(s) / ED Diagnoses   Final diagnoses:  Hyperkalemia  AKI (acute kidney injury) [facility])    ED Discharge Orders    None       Lucrezia Starch, MD 05/10/19 1517

## 2019-05-10 NOTE — ED Notes (Signed)
EKG handed to Roslynn Amble, MD

## 2019-05-10 NOTE — Progress Notes (Signed)
ED TO INPATIENT HANDOFF REPORT  Name/Age/Gender Stephanie Olson 78 y.o. female  Code Status Code Status History    Date Active Date Inactive Code Status Order ID Comments User Context   04/21/2019 2147 04/26/2019 1748 Full Code [id number]  Toy Baker, MD ED   04/22/2018 1812 04/28/2018 0310 Full Code [id number]  Kerney Elbe, DO ED   01/29/2015 2016 01/30/2015 2121 Full Code [id number]  Serafina Mitchell, MD Inpatient   01/29/2015 1733 01/29/2015 2016 Full Code [id number]  Angelia Mould, MD Inpatient   Advance Care Planning Activity      Home/SNF/Other Nursing Home  Chief Complaint hyperkalemia  Level of Care/Admitting Diagnosis ED Disposition    ED Disposition Condition [place]: [facility] [id number]  Level of Care: Telemetry [5]  Admit to tele based on following criteria: Other see comments  Comments: Hyperkalemia  Covid Evaluation: Asymptomatic Screening Protocol (No Symptoms)  Diagnosis: Hyperkalemia [id number]  Admitting Physician: Mercy Riding [id number]  Attending Physician: Mercy Riding [id number]  PT Class (Do Not Modify): Observation [104]  PT Acc Code (Do Not Modify): Observation [10022]       Medical History Past Medical History:  Diagnosis Date  . Arthritis   . Diabetes mellitus without complication ([place])   . Diabetic foot ulcers ([facility])    RIGHT   . Hypertension   . Peripheral vascular disease ([place])     Allergies No Known Allergies  IV Location/Drains/Wounds Patient Lines/Drains/Airways Status   Active Line/Drains/Airways    Name:   Placement date:   Placement time:   Site:   Days:   Peripheral IV 04/21/19 Left;Posterior Forearm   04/21/19    1945    Forearm   19   Peripheral IV 05/10/19 Right Forearm   05/10/19    1607    Forearm   less than 1   Urethral Catheter Ify O-RN Straight-tip 16 Fr.   04/23/19    1330    Straight-tip   17   Pressure Injury 04/22/18 Full thickness non-pressure would  with skin graft healing   04/22/18    1833     383   Pressure Injury 04/22/19 Foot Right Deep Tissue Injury - Purple or maroon localized area of discolored intact skin or blood-filled blister due to damage of underlying soft tissue from pressure and/or shear. DTI - maroon area to upper edge of out R foot   04/22/19    1500     18          Labs/Imaging Results for orders placed or performed during the hospital encounter of 05/10/19 (from the past 48 hour(s))  CBC with Differential     Status: Abnormal   Collection Time: 05/10/19  1:06 PM  Result Value Ref Range   WBC 8.8 4.0 - 10.5 K/uL   RBC 3.07 (L) 3.87 - 5.11 MIL/uL   Hemoglobin 9.0 (L) 12.0 - 15.0 g/dL   HCT 30.3 (L) 36.0 - 46.0 %   MCV 98.7 80.0 - 100.0 fL   MCH 29.3 26.0 - 34.0 pg   MCHC 29.7 (L) 30.0 - 36.0 g/dL   RDW 16.6 (H) 11.5 - 15.5 %   Platelets 339 150 - 400 K/uL   nRBC 0.0 0.0 - 0.2 %   Neutrophils Relative % 46 %   Neutro Abs 4.1 1.7 - 7.7 K/uL   Lymphocytes Relative 45 %   Lymphs Abs 3.9 0.7 - 4.0 K/uL   Monocytes  Relative 7 %   Monocytes Absolute 0.6 0.1 - 1.0 K/uL   Eosinophils Relative 1 %   Eosinophils Absolute 0.1 0.0 - 0.5 K/uL   Basophils Relative 1 %   Basophils Absolute 0.1 0.0 - 0.1 K/uL   Immature Granulocytes 0 %   Abs Immature Granulocytes 0.02 0.00 - 0.07 K/uL    Comment: Performed at [facility], [place] [address], [place], [place] [id number]  Basic metabolic panel     Status: Abnormal   Collection Time: 05/10/19  1:06 PM  Result Value Ref Range   Sodium 135 135 - 145 mmol/L   Potassium 6.6 (HH) 3.5 - 5.1 mmol/L    Comment: NO VISIBLE HEMOLYSIS REPEATED TO VERIFY CRITICAL RESULT CALLED TO, READ BACK BY AND VERIFIED WITH: ZULETA,C. RN AT 1352 05/10/19 MULLINS,T    Chloride 113 (H) 98 - 111 mmol/L   CO2 17 (L) 22 - 32 mmol/L   Glucose, Bld 91 70 - 99 mg/dL   BUN 37 (H) 8 - 23 mg/dL   Creatinine, Ser 2.17 (H) 0.44 - 1.00 mg/dL   Calcium 8.2 (L) 8.9 - 10.3 mg/dL   GFR  calc non Af Amer 21 (L) >60 mL/min   GFR calc Af Amer 25 (L) >60 mL/min   Anion gap 5 5 - 15    Comment: Performed at [facility], [place] [address] [place], [place] [address]  Urinalysis, Routine w reflex microscopic     Status: Abnormal   Collection Time: 05/10/19  3:19 PM  Result Value Ref Range   Color, Urine YELLOW YELLOW   APPearance HAZY (A) CLEAR   Specific Gravity, Urine 1.010 1.005 - 1.030   pH 6.0 5.0 - 8.0   Glucose, UA NEGATIVE NEGATIVE mg/dL   Hgb urine dipstick NEGATIVE NEGATIVE   Bilirubin Urine NEGATIVE NEGATIVE   Ketones, ur NEGATIVE NEGATIVE mg/dL   Protein, ur 30 (A) NEGATIVE mg/dL   Nitrite NEGATIVE NEGATIVE   Leukocytes,Ua SMALL (A) NEGATIVE   RBC / HPF 0-5 0 - 5 RBC/hpf   WBC, UA 21-50 0 - 5 WBC/hpf   Bacteria, UA MANY (A) NONE SEEN   Squamous Epithelial / LPF 0-5 0 - 5    Comment: Performed at [facility], [place] [address], [place], [place] [address]   No results found.  Pending Labs [employer] (From admission, onward)    Start     Ordered   05/10/19 1409  SARS CORONAVIRUS 2 (TAT 6-24 HRS) Nasopharyngeal Nasopharyngeal Swab  (Asymptomatic/Tier 2 Patients Labs)  Once,   STAT    Question Answer Comment  Is this test for diagnosis or screening Screening   Symptomatic for COVID-19 as defined by CDC No   Hospitalized for COVID-19 No   Admitted to ICU for COVID-19 No   Previously tested for COVID-19 Yes   Resident in a congregate (group) care setting No   Employed in healthcare setting No   Pregnant No      05/10/19 1408   Signed and Held  CBC  (heparin)  Once,   R    Comments: Baseline for heparin therapy IF NOT ALREADY DRAWN.  Notify MD if PLT < 100 K.    Signed and Held   Signed and Held  Creatinine, serum  (heparin)  Once,   R    Comments: Baseline for heparin therapy IF NOT ALREADY DRAWN.    Signed and Held   Signed and Held  Basic metabolic panel  Once,   R     Signed and Held   Signed and Held   Phosphorus  Once,   R     Signed and Held   Signed and Occupational hygienist morning,   R     Signed and Held   Signed and Held  CBC  Tomorrow morning,   R     Signed and Held   Signed and Held  Aldosterone + renin activity w/ ratio  Once,   R     Signed and Held          Vitals/Pain Today's Vitals   05/10/19 1500 05/10/19 1530 05/10/19 1600 05/10/19 1615  BP: 139/72 138/68 (!) 141/69   Pulse: 78 78 69 73  Resp: 15   15  Temp:      TempSrc:      SpO2: 99% 100% 100% 99%  Weight:      Height:      PainSc:        Isolation Precautions No active isolations  Medications Medications  calcium gluconate 1 g/ 50 mL sodium chloride IVPB (1,000 mg Intravenous New Bag/Given 05/10/19 1627)  insulin aspart (novoLOG) injection 5 Units (5 Units Intravenous Given 05/10/19 1612)    And  dextrose 50 % solution 50 mL (50 mLs Intravenous Given 05/10/19 1613)  albuterol (VENTOLIN HFA) 108 (90 Base) MCG/ACT inhaler 2 puff (2 puffs Inhalation Given 05/10/19 1611)  sodium polystyrene (KAYEXALATE) 15 GM/60ML suspension 15 g (15 g Oral Given 05/10/19 1621)  sodium zirconium cyclosilicate (LOKELMA) packet 5 g (5 g Oral Given 05/10/19 1621)    Mobility walks with device

## 2019-05-10 NOTE — ED Notes (Signed)
Unable to bladder scan at this time due to equipment issues.

## 2019-05-10 NOTE — ED Triage Notes (Signed)
Patient presents with abnormally high potassium lab results. She presented to the hospital with the same last week.    EMS vitals: 118/76 BP 84 HR 99% O2 sat on room air 16 Resp Rate

## 2019-05-10 NOTE — H&P (Signed)
History and Physical    Stephanie Olson [id number] DOB: 15-Nov-1940 DOA: 05/10/2019  PCP: Benito Mccreedy, MD Patient coming from: Facility  Chief Complaint: Abnormal lab  HPI: Stephanie Olson is a 78 y.o. female with history of CKD-4, DM-2, HTN, anemia of chronic disease, diabetic neuropathy, PVD/angioplasty and arthrectomy in 2016 and urinary retention presenting from facility with hyperkalemia.  Reportedly potassium elevated to 7 at facility.  She was sent to ED for evaluation.  Patient was recently hospitalized about 2 weeks ago for hyperkalemia.   She was discharged to follow-up with nephrology.  She states that she was at her nephrology office yesterday and had a blood work.  She was told, to return in a year.  She denies any medication changes.  Denies URI symptoms, fever, chills, cough, shortness of breath, chest pain, nausea, vomiting, diarrhea, abdominal pain or UTI symptoms.  She denies focal neuro symptoms or palpitation.  Denies change to appetite.  Lives at facility.  She denies smoking cigarettes, drinking alcohol recreational drug use.  In ED, HDS.  K6.6.  Chloride 113.  Bicarb 17.  Anion gap 5.  Creatinine 2.17 (baseline).  BUN 37 (baseline).  Hgb 9.0 (baseline).  EKG NSR without peaked T waves or abnormal intervals.  Received albuterol. IV insulin, dextrose, Lokelma and Kayexalate ordered.  Nephrology consulted and hospitalist service was called for admission for hyperkalemia.  EDP waiting to hear from nephrology  ROS All review of system negative except for pertinent positives and negatives as history of present illness above.  PMH Past Medical History:  Diagnosis Date  . Arthritis   . Diabetes mellitus without complication ([place])   . Diabetic foot ulcers (H[facility])    RIGHT   . Hypertension   . Peripheral vascular disease ([place])    [place] Past Surgical History:  Procedure Laterality Date  . ABDOMINAL AORTAGRAM  01/29/2015  . ATHERECTOMY Right 01/29/2015   FEMORAL  ARTERY   . BALLOON ANGIOPLASTY, ARTERY Right 01/29/2015   RT FEMORAL   . FOOT SURGERY    . PERIPHERAL VASCULAR CATHETERIZATION N/A 01/29/2015   Procedure: Abdominal Aortogram w/Lower Extremity;  Surgeon: Serafina Mitchell, MD;  Location: [facility];  Service: Cardiovascular;  Laterality: N/A;   Fam HX Family History  Problem Relation Age of Onset  . Hypertension Mother   . Hypertension Father     Social Hx  reports that she has been smoking cigarettes. She has never used smokeless tobacco. She reports that she does not drink alcohol or use drugs.  Allergy No Known Allergies Home Meds Prior to Admission medications   Medication Sig Start Date End Date Taking? Authorizing Provider  acetaminophen (TYLENOL) 325 MG tablet Take 650 mg by mouth every 8 (eight) hours as needed for moderate pain or headache.   Yes [provider]  acetaminophen (TYLENOL) 500 MG tablet Take 500 mg by mouth 2 (two) times daily.    Yes [provider]  aspirin 81 MG chewable tablet Chew 81 mg by mouth daily.   Yes [provider]  bisacodyl (DULCOLAX) 10 MG suppository Place 1 suppository (10 mg total) rectally daily as needed for moderate constipation. 04/26/18  Yes Georgette Shell, MD  dicyclomine (BENTYL) 10 MG capsule Take 10 mg by mouth 2 (two) times daily. 03/17/19  Yes [provider]  DULoxetine (CYMBALTA) 20 MG capsule Take 20 mg by mouth daily. 04/16/19  Yes [provider]  HYDROcodone-acetaminophen (NORCO/VICODIN) 5-325 MG tablet Take 1 tablet by mouth  every 4 (four) hours as needed for moderate pain.   Yes [provider]  loperamide (IMODIUM) 2 MG capsule Take 1 capsule (2 mg total) by mouth every 6 (six) hours as needed for diarrhea or loose stools. 04/26/18  Yes Georgette Shell, MD  megestrol (MEGACE) 400 MG/10ML suspension Take 10 mLs (400 mg total) by mouth daily. 04/27/18  Yes Emokpae, Courage, MD  Melatonin 3 MG TABS Take 3 mg by  mouth at bedtime.    Yes [provider]  mirtazapine (REMERON) 7.5 MG tablet Take 7.5 mg by mouth at bedtime.   Yes [provider]  ondansetron (ZOFRAN) 4 MG tablet Take 1 tablet (4 mg total) by mouth every 6 (six) hours as needed for nausea. 04/26/18  Yes Georgette Shell, MD  polyvinyl alcohol (LIQUIFILM TEARS) 1.4 % ophthalmic solution Place 1 drop into both eyes 2 (two) times daily.   Yes [provider]  pravastatin (PRAVACHOL) 20 MG tablet Take 10 mg by mouth at bedtime. 04/05/19  Yes [provider]  senna (SENOKOT) 8.6 MG TABS tablet Take 1 tablet by mouth 2 (two) times daily.   Yes [provider]  SPS 15 GM/60ML suspension Take 60 mLs by mouth daily. 04/07/19  Yes [provider]  tamsulosin (FLOMAX) 0.4 MG CAPS capsule Take 0.4 mg by mouth daily. 04/06/19  Yes [provider]  trolamine salicylate (ASPERCREME) 10 % cream Apply 1 application topically daily. Right lateral ankle   Yes [provider]    Physical Exam: Vitals:   05/10/19 1217 05/10/19 1230 05/10/19 1300 05/10/19 1330  BP:  (!) 115/59 136/67 135/63  Pulse:  75 77 72  Resp:  15 (!) 22 12  Temp: 99.6 F (37.6 C)     TempSrc: Oral     SpO2:  99% 99% 100%  Weight:      Height:        GENERAL: No acute distress.  Appears well.  HEENT: MMM.  Vision and hearing grossly intact.  NECK: Supple.  No apparent JVD.  RESP:  No IWOB. Good air movement bilaterally. CVS:  RRR.  Heart sounds normal.  ABD/GI/GU: Bowel sounds present. Soft. Non tender.  MSK/EXT:  Moves extremities.  Significant muscle mass wasting.  SKIN: Healed diabetic foot ulcer over right foot.  No active wound or signs of infection. NEURO: Awake, alert and oriented appropriately.  No gross deficit.  PSYCH: Calm. Normal affect.   Personally Reviewed Radiological Exams No results found.   Personally Reviewed Labs: CBC: Recent Labs  Lab 05/10/19 1306  WBC 8.8  NEUTROABS 4.1   HGB 9.0*  HCT 30.3*  MCV 98.7  PLT 99991111   Basic Metabolic Panel: Recent Labs  Lab 05/10/19 1306  NA 135  K 6.6*  CL 113*  CO2 17*  GLUCOSE 91  BUN 37*  CREATININE 2.17*  CALCIUM 8.2*   GFR: Estimated Creatinine Clearance: 23 mL/min (A) (by C-G formula based on SCr of 2.17 mg/dL (H)). Liver Function Tests: No results for input(s): AST, ALT, ALKPHOS, BILITOT, PROT, ALBUMIN in the last 168 hours. No results for input(s): LIPASE, AMYLASE in the last 168 hours. No results for input(s): AMMONIA in the last 168 hours. Coagulation Profile: No results for input(s): INR, PROTIME in the last 168 hours. Cardiac Enzymes: No results for input(s): CKTOTAL, CKMB, CKMBINDEX, TROPONINI in the last 168 hours. BNP (last 3 results) No results for input(s): PROBNP in the last 8760 hours. HbA1C: No results for input(s):  HGBA1C in the last 72 hours. CBG: No results for input(s): GLUCAP in the last 168 hours. Lipid Profile: No results for input(s): CHOL, HDL, LDLCALC, TRIG, CHOLHDL, LDLDIRECT in the last 72 hours. Thyroid Function Tests: No results for input(s): TSH, T4TOTAL, FREET4, T3FREE, THYROIDAB in the last 72 hours. Anemia Panel: No results for input(s): VITAMINB12, FOLATE, FERRITIN, TIBC, IRON, RETICCTPCT in the last 72 hours. Urine analysis:    Component Value Date/Time   COLORURINE STRAW (A) 04/21/2019 1944   APPEARANCEUR HAZY (A) 04/21/2019 1944   LABSPEC 1.012 04/21/2019 1944   PHURINE 7.0 04/21/2019 1944   GLUCOSEU NEGATIVE 04/21/2019 1944   HGBUR SMALL (A) 04/21/2019 1944   BILIRUBINUR NEGATIVE 04/21/2019 1944   KETONESUR NEGATIVE 04/21/2019 1944   PROTEINUR 30 (A) 04/21/2019 1944   UROBILINOGEN 0.2 03/03/2010 0808   NITRITE POSITIVE (A) 04/21/2019 1944   LEUKOCYTESUR LARGE (A) 04/21/2019 1944    Sepsis Labs:  None  Personally Reviewed EKG:  Normal sinus rhythm with no peak T waves or abnormal intervals.  Assessment/Plan Hyperkalemia in patient with CKD-4:  Potassium 6.6.  Likely due to chronic renal failure.  Not on obvious medication that could contribute.  EKG without significant finding. -S/p albuterol, dextrose, insulin, Lokelma and Kayexalate in ED. -Recheck BMP this evening, done in the morning -Check cholesterol level. -Nephrology consulted by EDP  Mild non-anion gap metabolic acidosis: Likely due to renal failure. -May consider bicarb -Recheck in the morning  Anemia of chronic disease: Hgb 9.0 (baseline 8-9).  Stable. -Recheck as needed  PVD: Stable -Continue home aspirin and statin  Chronic pain/diabetic neuropathy: A1c 5.7% on 04/21/2019.  Not on diabetic agents. -Continue home Cymbalta. -Continue home Norco  Moderate malnutrition: BMI 23.44. -Continue home Megace and Remeron -Consult dietitian   DVT prophylaxis: Subcu heparin  Code Status: Full code Family Communication: Updated patient's son over the phone. Disposition Plan: Admit to telemetry. Consults called: Nephrology consulted by EDP. Admission status: Observation   Mercy Riding MD [facility]  If 7PM-7AM, please contact night-coverage [email or web address] Password [email or web address]  05/10/2019, 4:08 PM

## 2019-05-11 ENCOUNTER — Observation Stay (HOSPITAL_COMMUNITY): Payer: Medicare Other

## 2019-05-11 DIAGNOSIS — F17210 Nicotine dependence, cigarettes, uncomplicated: Secondary | ICD-10-CM | POA: Diagnosis present

## 2019-05-11 DIAGNOSIS — R338 Other retention of urine: Secondary | ICD-10-CM | POA: Diagnosis not present

## 2019-05-11 DIAGNOSIS — E875 Hyperkalemia: Secondary | ICD-10-CM | POA: Diagnosis not present

## 2019-05-11 DIAGNOSIS — D631 Anemia in chronic kidney disease: Secondary | ICD-10-CM | POA: Diagnosis present

## 2019-05-11 DIAGNOSIS — E1122 Type 2 diabetes mellitus with diabetic chronic kidney disease: Secondary | ICD-10-CM | POA: Diagnosis present

## 2019-05-11 DIAGNOSIS — Z79899 Other long term (current) drug therapy: Secondary | ICD-10-CM | POA: Diagnosis not present

## 2019-05-11 DIAGNOSIS — E2740 Unspecified adrenocortical insufficiency: Secondary | ICD-10-CM | POA: Diagnosis not present

## 2019-05-11 DIAGNOSIS — N184 Chronic kidney disease, stage 4 (severe): Secondary | ICD-10-CM | POA: Diagnosis present

## 2019-05-11 DIAGNOSIS — E1151 Type 2 diabetes mellitus with diabetic peripheral angiopathy without gangrene: Secondary | ICD-10-CM | POA: Diagnosis present

## 2019-05-11 DIAGNOSIS — Z7982 Long term (current) use of aspirin: Secondary | ICD-10-CM | POA: Diagnosis not present

## 2019-05-11 DIAGNOSIS — E872 Acidosis: Secondary | ICD-10-CM | POA: Diagnosis not present

## 2019-05-11 DIAGNOSIS — Z8673 Personal history of transient ischemic attack (TIA), and cerebral infarction without residual deficits: Secondary | ICD-10-CM | POA: Diagnosis not present

## 2019-05-11 DIAGNOSIS — Z20828 Contact with and (suspected) exposure to other viral communicable diseases: Secondary | ICD-10-CM | POA: Diagnosis present

## 2019-05-11 DIAGNOSIS — N179 Acute kidney failure, unspecified: Secondary | ICD-10-CM | POA: Diagnosis not present

## 2019-05-11 DIAGNOSIS — E119 Type 2 diabetes mellitus without complications: Secondary | ICD-10-CM | POA: Diagnosis not present

## 2019-05-11 DIAGNOSIS — Z993 Dependence on wheelchair: Secondary | ICD-10-CM | POA: Diagnosis not present

## 2019-05-11 DIAGNOSIS — I129 Hypertensive chronic kidney disease with stage 1 through stage 4 chronic kidney disease, or unspecified chronic kidney disease: Secondary | ICD-10-CM | POA: Diagnosis present

## 2019-05-11 DIAGNOSIS — R627 Adult failure to thrive: Secondary | ICD-10-CM | POA: Diagnosis present

## 2019-05-11 DIAGNOSIS — E1140 Type 2 diabetes mellitus with diabetic neuropathy, unspecified: Secondary | ICD-10-CM | POA: Diagnosis present

## 2019-05-11 DIAGNOSIS — Z8249 Family history of ischemic heart disease and other diseases of the circulatory system: Secondary | ICD-10-CM | POA: Diagnosis not present

## 2019-05-11 DIAGNOSIS — L89616 Pressure-induced deep tissue damage of right heel: Secondary | ICD-10-CM | POA: Diagnosis present

## 2019-05-11 LAB — BASIC METABOLIC PANEL
Anion gap: 6 (ref 5–15)
Anion gap: 5 (ref 5–15)
BUN: 38 mg/dL — ABNORMAL HIGH (ref 8–23)
BUN: 40 mg/dL — ABNORMAL HIGH (ref 8–23)
CO2: 16 mmol/L — ABNORMAL LOW (ref 22–32)
CO2: 18 mmol/L — ABNORMAL LOW (ref 22–32)
Calcium: 7.3 mg/dL — ABNORMAL LOW (ref 8.9–10.3)
Calcium: 7.1 mg/dL — ABNORMAL LOW (ref 8.9–10.3)
Chloride: 117 mmol/L — ABNORMAL HIGH (ref 98–111)
Chloride: 118 mmol/L — ABNORMAL HIGH (ref 98–111)
Creatinine, Ser: 2.36 mg/dL — ABNORMAL HIGH (ref 0.44–1.00)
Creatinine, Ser: 2.32 mg/dL — ABNORMAL HIGH (ref 0.44–1.00)
GFR calc Af Amer: 22 mL/min — ABNORMAL LOW (ref >60)
GFR calc Af Amer: 23 mL/min — ABNORMAL LOW (ref >60)
GFR calc non Af Amer: 19 mL/min — ABNORMAL LOW (ref >60)
GFR calc non Af Amer: 20 mL/min — ABNORMAL LOW (ref >60)
Glucose, Bld: 158 mg/dL — ABNORMAL HIGH (ref 70–99)
Glucose, Bld: 115 mg/dL — ABNORMAL HIGH (ref 70–99)
Potassium: 6.1 mmol/L — ABNORMAL HIGH (ref 3.5–5.1)
Potassium: 6.6 mmol/L (ref 3.5–5.1)
Sodium: 139 mmol/L (ref 135–145)
Sodium: 141 mmol/L (ref 135–145)

## 2019-05-11 LAB — GLUCOSE, CAPILLARY
Glucose-Capillary: 79 mg/dL (ref 70–99)
Glucose-Capillary: 128 mg/dL — ABNORMAL HIGH (ref 70–99)

## 2019-05-11 LAB — CK: Total CK: 60 U/L (ref 38–234)

## 2019-05-11 LAB — POTASSIUM
Potassium: 6.4 mmol/L (ref 3.5–5.1)
Potassium: 5.6 mmol/L — ABNORMAL HIGH (ref 3.5–5.1)

## 2019-05-11 LAB — CBC
HCT: 28.8 % — ABNORMAL LOW (ref 36.0–46.0)
Hemoglobin: 8.4 g/dL — ABNORMAL LOW (ref 12.0–15.0)
MCH: 29.5 pg (ref 26.0–34.0)
MCHC: 29.2 g/dL — ABNORMAL LOW (ref 30.0–36.0)
MCV: 101.1 fL — ABNORMAL HIGH (ref 80.0–100.0)
Platelets: 285 10*3/uL (ref 150–400)
RBC: 2.85 MIL/uL — ABNORMAL LOW (ref 3.87–5.11)
RDW: 16.9 % — ABNORMAL HIGH (ref 11.5–15.5)
WBC: 8.4 10*3/uL (ref 4.0–10.5)
nRBC: 0 % (ref 0.0–0.2)

## 2019-05-11 MED ORDER — SODIUM CHLORIDE 0.9 % IV SOLN
INTRAVENOUS | Status: DC
  Administered 2019-05-11 – 2019-05-13 (×5): via INTRAVENOUS

## 2019-05-11 MED ORDER — SODIUM CHLORIDE 0.9 % IV BOLUS
500.0000 mL | Freq: Once | INTRAVENOUS | Status: AC
  Administered 2019-05-11: 500 mL via INTRAVENOUS

## 2019-05-11 MED ORDER — BOOST / RESOURCE BREEZE PO LIQD CUSTOM
1.0000 | Freq: Two times a day (BID) | ORAL | Status: DC
  Administered 2019-05-11 – 2019-05-14 (×6): 1 via ORAL

## 2019-05-11 MED ORDER — DEXTROSE 50 % IV SOLN
1.0000 | Freq: Once | INTRAVENOUS | Status: AC
  Administered 2019-05-11: 16:00:00 50 mL via INTRAVENOUS

## 2019-05-11 MED ORDER — SODIUM ZIRCONIUM CYCLOSILICATE 5 G PO PACK
5.0000 g | PACK | Freq: Three times a day (TID) | ORAL | Status: AC
  Administered 2019-05-11 (×3): 5 g via ORAL
  Filled 2019-05-11 (×3): qty 1

## 2019-05-11 MED ORDER — INSULIN ASPART 100 UNIT/ML IV SOLN
5.0000 [IU] | Freq: Once | INTRAVENOUS | Status: AC
  Administered 2019-05-11: 5 [IU] via INTRAVENOUS

## 2019-05-11 MED ORDER — SODIUM BICARBONATE 650 MG PO TABS
1300.0000 mg | ORAL_TABLET | Freq: Three times a day (TID) | ORAL | Status: DC
  Administered 2019-05-11 – 2019-05-14 (×8): 1300 mg via ORAL
  Filled 2019-05-11 (×8): qty 2

## 2019-05-11 MED ORDER — DEXTROSE 50 % IV SOLN
INTRAVENOUS | Status: AC
  Administered 2019-05-11: 50 mL via INTRAVENOUS
  Filled 2019-05-11: qty 50

## 2019-05-11 MED ORDER — SODIUM BICARBONATE 8.4 % IV SOLN
50.0000 meq | Freq: Once | INTRAVENOUS | Status: DC
  Filled 2019-05-11: qty 50

## 2019-05-11 MED ORDER — FUROSEMIDE 10 MG/ML IJ SOLN
40.0000 mg | Freq: Once | INTRAMUSCULAR | Status: AC
  Administered 2019-05-11: 40 mg via INTRAVENOUS
  Filled 2019-05-11: qty 4

## 2019-05-11 MED ORDER — PRO-STAT SUGAR FREE PO LIQD
30.0000 mL | Freq: Two times a day (BID) | ORAL | Status: DC
  Administered 2019-05-11 – 2019-05-14 (×6): 30 mL via ORAL
  Filled 2019-05-11 (×6): qty 30

## 2019-05-11 MED ORDER — SODIUM BICARBONATE 8.4 % IV SOLN
50.0000 meq | Freq: Once | INTRAVENOUS | Status: AC
  Administered 2019-05-11: 50 meq via INTRAVENOUS
  Filled 2019-05-11: qty 50

## 2019-05-11 MED ORDER — INSULIN ASPART 100 UNIT/ML ~~LOC~~ SOLN
0.0000 [IU] | Freq: Three times a day (TID) | SUBCUTANEOUS | Status: DC
  Administered 2019-05-12: 3 [IU] via SUBCUTANEOUS
  Administered 2019-05-12 – 2019-05-14 (×4): 1 [IU] via SUBCUTANEOUS

## 2019-05-11 NOTE — Plan of Care (Signed)

## 2019-05-11 NOTE — Progress Notes (Signed)
PROGRESS NOTE  Stephanie Olson [id number] DOB: Jun 03, 1941 DOA: 05/10/2019 PCP: Benito Mccreedy, MD  HPI/Recap of past 24 hours:  She denies pain, very pleasant, but appear slightly confused about the time and place, not sure if this is baseline  Assessment/Plan: Active Problems:   Hyperkalemia  HyperkalemiaAkI/CKDIII/metabolic acidosis:  -persistent hyperkalemia after treatment in the ED, will give bicarb, insulin/d50, schedule lokelma, appear dry will start ivf, keep on tele, monitor serial k -per chart review she had h/o hydronephrosis from urinary retention, will get renal US and bladder scan -Ua + bacteria, she denies symptom, urine culture ordered   Addendum: Urinary retention :PVR 400, foley placed with 600cc output, she is continued on flomax Persistent hyperkalemia, case discussed with nephrology Dr Johnney Ou who agrees with above management , in addition recommend fluids bolus 500cc with iv lasix 40mg  x1 -nephrology will see patient in consult  Anemia of chronic disease: Hgb 9.0 (baseline 8-9).  Stable. -Recheck as needed   Chronic pain/diabetic neuropathy: A1c 5.7% on 04/21/2019.  Not on diabetic agents. -Continue home Cymbalta. 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 vessel ischemia On asa/statin  PVD:  -Per chart review she is s/p right femoral balloon angioplasty in 01/2015 -she denies leg pain -Continue home aspirin and statin   Chronic pressure ulcer right ankle/heal, presents on admission  Wound care consulted   FTT; she reports a long term care resident at snf since early this year, she is wheelchair bound at baseline    DVT Prophylaxis:heparin  Code Status: full  Family Communication: patient   Disposition Plan: she is from SNF longterm care, plan to return to snf once medically stable   Consultants:  Case discussed with nephrology Dr Johnney Ou  Procedures:  Foley placement   Antibiotics:  none   Objective: BP (!) 96/57 (BP Location: Right Arm)   Pulse 91   Temp 98.2 F (36.8 C) (Oral)   Resp 20   Ht 5\' 9"  (1.753 m)   Wt 70.6 kg   SpO2 97%   BMI 22.98 kg/m   Intake/Output Summary (Last 24 hours) at 05/11/2019 1151 Last data filed at 05/11/2019 1145 Gross per 24 hour  Intake 120 ml  Output 375 ml  Net -255 ml   Filed Weights   05/10/19 1216 05/10/19 1700  Weight: 72 kg 70.6 kg    Exam: Patient is examined daily including today on 05/11/2019, exams remain the same as of yesterday except that has changed    General:  NAD, slightly confused about the time and place but able to be reoriented   Cardiovascular: RRR  Respiratory: CTABL  Abdomen: Soft/ND/NT, positive BS  Musculoskeletal: No Edema, right ankle/heal pressure ulcer (chronic)  Neuro: alert,slightly confused about the time and place but able to be reoriented    Data Reviewed: Basic Metabolic Panel: Recent Labs  Lab 05/10/19 1306 05/10/19 1723 05/11/19 0523  NA 135 138 139  K 6.6* 6.1* 6.1*  CL 113* 113* 117*  CO2 17* 17* 16*  GLUCOSE 91 105* 158*  BUN 37* 35* 38*  CREATININE 2.17* 2.15*  2.14* 2.36*  CALCIUM 8.2* 8.7* 7.3*  PHOS  --  4.2  --    Liver Function Tests: No results for input(s): AST, ALT, ALKPHOS, BILITOT, PROT, ALBUMIN in the last 168 hours. No results for input(s): LIPASE, AMYLASE in the last 168 hours. No results for input(s): AMMONIA in the last 168 hours. CBC: Recent Labs  Lab 05/10/19  1306 05/10/19 1723 05/11/19 0523  WBC 8.8 9.9 8.4  NEUTROABS 4.1  --   --   HGB 9.0* 9.6* 8.4*  HCT 30.3* 32.8* 28.8*  MCV 98.7 100.0 101.1*  PLT 339 357 285   Cardiac Enzymes:   No results for input(s): CKTOTAL, CKMB, CKMBINDEX, TROPONINI in the last 168 hours. BNP (last 3 results) No results for input(s): BNP in the last 8760 hours.  ProBNP (last 3 results) No results for input(s): PROBNP in the last 8760 hours.  CBG: No results for input(s): GLUCAP  in the last 168 hours.  Recent Results (from the past 240 hour(s))  SARS CORONAVIRUS 2 (TAT 6-24 HRS) Nasopharyngeal Nasopharyngeal Swab     Status: None   Collection Time: 05/10/19  3:19 PM   Specimen: Nasopharyngeal Swab  Result Value Ref Range Status   SARS Coronavirus 2 NEGATIVE NEGATIVE Final    Comment: (NOTE) SARS-CoV-2 target nucleic acids are NOT DETECTED. The SARS-CoV-2 RNA is generally detectable in upper and lower respiratory specimens during the acute phase of infection. Negative results do not preclude SARS-CoV-2 infection, do not rule out co-infections with other pathogens, and should not be used as the sole basis for treatment or other patient management decisions. Negative results must be combined with clinical observations, patient history, and epidemiological information. The expected result is Negative. Fact Sheet for Patients: SugarRoll.be Fact Sheet for Healthcare Providers: https://www.woods-mathews.com/ This test is not yet approved or cleared by the [place] FDA and  has been authorized for detection and/or diagnosis of SARS-CoV-2 by FDA under an Emergency Use Authorization (EUA). This EUA will remain  in effect (meaning this test can be used) for the duration of the COVID-19 declaration under Section 56 4(b)(1) of the Act, 21 U.S.C. section 360bbb-3(b)(1), unless the authorization is terminated or revoked sooner. Performed at [facility], [place] [address], [place], [place] [address]      Studies: No results found.  Scheduled Meds: . aspirin  81 mg Oral Daily  . docusate sodium  100 mg Oral BID  . DULoxetine  20 mg Oral Daily  . heparin  5,000 Units Subcutaneous Q8H  . megestrol  400 mg Oral Daily  . Melatonin  3 mg Oral QHS  . mirtazapine  7.5 mg Oral QHS  . polyvinyl alcohol  1 drop Both Eyes BID  . pravastatin  10 mg Oral QHS  . sodium zirconium cyclosilicate  5 g Oral TID  . tamsulosin  0.4  mg Oral Daily    Continuous Infusions:   Time spent: 78mins I have personally reviewed and interpreted on  05/11/2019 daily labs, tele strips, imagings as discussed above under date review session and assessment and plans.  I reviewed all nursing notes, pharmacy notes, consultant notes,  vitals, pertinent old records  I have discussed plan of care as described above with RN , patient  on 05/11/2019   Florencia Reasons MD, PhD, FACP  [facility] Pager [phone]. If 7PM-7AM, please contact night-coverage at [email or web address], password [facility] 05/11/2019, 11:51 AM  LOS: 0 days

## 2019-05-11 NOTE — Significant Event (Signed)
Critical potassium 6.4.  Md notified.  awaiting new orders

## 2019-05-11 NOTE — Evaluation (Signed)
Physical Therapy Evaluation Patient Details Name: Stephanie Olson MRN: [id number] DOB: 1941-06-06 Today's Date: 05/11/2019   History of Present Illness  78 y.o. female with history of CKD-4, DM-2, HTN, anemia of chronic disease, diabetic neuropathy, PVD/angioplasty and arthrectomy in 2016 and urinary retention presenting from facility with hyperkalemia.  Reportedly potassium elevated to 7 at facility.  Clinical Impression  Pt admitted with above diagnosis, recent admission ~ 2 wks ago.  Pt from SNF and anticipating return to SNF at d/c. Will follow in acute setting.  Pt currently with functional limitations due to the deficits listed below (see PT Problem List). Pt will benefit from skilled PT to increase their independence and safety with mobility to allow discharge to the venue listed below.       Follow Up Recommendations SNF    Equipment Recommendations  None recommended by PT    Recommendations for Other Services       Precautions / Restrictions Precautions Precautions: Fall Precaution Comments: Has deep tissue injury on R heel/lateral foot, in prevalon boot at time of PT eval Restrictions Weight Bearing Restrictions: No      Mobility  Bed Mobility Overal bed mobility: Needs Assistance Bed Mobility: Supine to Sit     Supine to sit: Min assist;Mod assist Sit to supine: Min assist   General bed mobility comments: assist to bring LEs off and onto bed, light assist to elevate trunk  Transfers                 General transfer comment: able to scoot laterally along EOB with min assist; pt declined OOB at this time and per RN pt needed in bed for [place]  Ambulation/Gait                Stairs            Wheelchair Mobility    Modified Rankin (Stroke Patients Only)       Balance                                             Pertinent Vitals/Pain Pain Assessment: No/denies pain    Home Living Family/patient expects to be  discharged to:: Skilled nursing facility                 Additional Comments: resident of [facility]    Prior Function           Comments: pt reports she transfers into [facility] independently, pt not a reliable historian     Hand Dominance        Extremity/Trunk Assessment   Upper Extremity Assessment Upper Extremity Assessment: Generalized weakness    Lower Extremity Assessment Lower Extremity Assessment: (diffuse atrophy bil LEs)       Communication      Cognition Arousal/Alertness: Awake/alert Behavior During Therapy: WFL for tasks assessed/performed Overall Cognitive Status: No family/caregiver present to determine baseline cognitive functioning                                 General Comments: oriented to self and month, year.  appears to have slower processing at times but able to follow one step commands      General Comments      Exercises     Assessment/Plan    PT Assessment Patient needs  continued PT services  PT Problem List Decreased activity tolerance;Decreased balance;Decreased mobility       PT Treatment Interventions Functional mobility training;Therapeutic activities;Therapeutic exercise;Patient/family education    PT Goals (Current goals can be found in the Care Plan section)  Acute Rehab PT Goals Patient Stated Goal: none stated PT Goal Formulation: Patient unable to participate in goal setting Time For Goal Achievement: 05/25/19 Potential to Achieve Goals: Good    Frequency Min 2X/week   Barriers to discharge        Co-evaluation               AM-PAC PT "6 Clicks" Mobility  Outcome Measure Help needed turning from your back to your side while in a flat bed without using bedrails?: A Little Help needed moving from lying on your back to sitting on the side of a flat bed without using bedrails?: A Little Help needed moving to and from a bed to a chair (including a wheelchair)?: A Lot Help needed standing  up from a chair using your arms (e.g., wheelchair or bedside chair)?: A Lot Help needed to walk in hospital room?: A Lot Help needed climbing 3-5 steps with a railing? : A Lot 6 Click Score: 14    End of Session   Activity Tolerance: Patient tolerated treatment well;Patient limited by fatigue Patient left: in bed;with bed alarm set;with call bell/phone within reach   PT Visit Diagnosis: Muscle weakness (generalized) (M62.81)    Time: [id number] PT Time Calculation (min) (ACUTE ONLY): 19 min   Charges:   PT Evaluation $PT Eval Low Complexity: 1 Low          Kenyon Ana, PT  Pager: [phone] Acute Rehab Dept [facility]): [phone]   05/11/2019   [facility] 05/11/2019, 1:31 PM

## 2019-05-11 NOTE — Progress Notes (Signed)
Verbal order to discontinue fluid restriction received from Dr [name]. Orders carried out.

## 2019-05-11 NOTE — Significant Event (Signed)
Lab notification of critical potassium level 6.6.  MD notified, awaiting further orders

## 2019-05-11 NOTE — Consult Note (Signed)
[facility] Nurse wound consult note Reason for Consult:DTPI (deep tissue pressure injury) to Right heel. Last seen by my partner, M. Austin on 04/24/19. Wound type: Pressure  Pressure Injury POA: Yes Measurement: 1cm x 2cm x 0cm Wound bed: dark purple nonblanchable injury right lateral foot Drainage (amount, consistency, odor) none Periwound:intact Dressing procedure/placement/frequency: Nursing is provided with guidance for the care of the chronic wound on the right heel: silicone foam dressings will be placed for treatment to the right heel and for prevention on the left heel. Prevalon boot is provided the RLE.   [facility] nursing team will not follow, but will remain available to this patient, the nursing and medical teams.  Please re-consult if needed. Thanks, Maudie Flakes, MSN, RN, [place], Arther Abbott  Pager# [phone]

## 2019-05-11 NOTE — Consult Note (Signed)
[facility]  INPATIENT CONSULTATION  Reason for Consultation: hyperkalemia, AKI Requesting Provider: Dr Florencia Reasons  HPI: Stephanie Olson is an 78 y.o. female HTN, DM, PAD who is currently admitted from SNF after labs there reportedly showed K 7.  Nephrology is consulted for evaluation and management of AKI.   Pt was recently (8/14-8/19/20) admitted under similar circumstances and found to have urinary retention on renal US.  She required foley placement and had urology consultation.  CT a/p without other pathology.  She was also diagnosed with a pseudomonal UTI.  During that admission Cr initially 2.3 and improved to 1.8 at discharge.  She was discharged with a foley in place and reportedly saw urology this week and told to f/u in 1 year.  She says the foley was removed at her SNF 1 day prior to the appt.  She had same admission with AKI secondary to obstruction in 04/2018 as well - at that time Cr 8.9 on admission and improved to 1.4 by discharge within a week.    This admission presenting labs 9/2 1pm showed Na 135, K 6.6, Bicarb 17, BUN 37, Cr 2.17.  She was treated with insulin, dextrose, IV calcium, kayexelate 15g, lokelma 5g.  AM labs today showed K 6.1, Cr 2.36, bicarb 16 and this afternoon again K 6.6, Cr 2.32.  She was started on lokelma 5g - dosed ~ 2pm, 6pm today.   Renal US without hydronephrosis but per nursing today PVR 436mL (had voided 137mL) and on foley insertion 651mL UOP out.   Pt tells me she's thirsty currently and also 'stays thirsty' at [facility].  She denies sensation of incomplete void, edema, orthopnea.  No n/v/d.  At baseline she's in a wheelchair at [facility].  I/Os yesterday 161mL/275mL (all UOP); today 761mL/700mL (all UOP).      PMH: Past Medical History:  Diagnosis Date  . Arthritis   . Diabetes mellitus without complication ([place])   . Diabetic foot ulcers (HCC)    RIGHT   . Hypertension   . Peripheral vascular disease (HCC)    PSH: Past Surgical History:   Procedure Laterality Date  . ABDOMINAL AORTAGRAM  01/29/2015  . ATHERECTOMY Right 01/29/2015   FEMORAL ARTERY   . BALLOON ANGIOPLASTY, ARTERY Right 01/29/2015   RT FEMORAL   . FOOT SURGERY    . PERIPHERAL VASCULAR CATHETERIZATION N/A 01/29/2015   Procedure: Abdominal Aortogram w/Lower Extremity;  Surgeon: Serafina Mitchell, MD;  Location: [facility];  Service: Cardiovascular;  Laterality: N/A;    Past Medical History:  Diagnosis Date  . Arthritis   . Diabetes mellitus without complication ([place])   . Diabetic foot ulcers (HCC)    RIGHT   . Hypertension   . Peripheral vascular disease ([place])     Medications:  I have reviewed the patient's current medications.  Medications Prior to Admission  Medication Sig Dispense Refill  . acetaminophen (TYLENOL) 325 MG tablet Take 650 mg by mouth every 8 (eight) hours as needed for moderate pain or headache.    Marland Kitchen acetaminophen (TYLENOL) 500 MG tablet Take 500 mg by mouth 2 (two) times daily.     Marland Kitchen aspirin 81 MG chewable tablet Chew 81 mg by mouth daily.    . bisacodyl (DULCOLAX) 10 MG suppository Place 1 suppository (10 mg total) rectally daily as needed for moderate constipation. 12 suppository 0  . dicyclomine (BENTYL) 10 MG capsule Take 10 mg by mouth 2 (two) times daily.    Marland Kitchen  DULoxetine (CYMBALTA) 20 MG capsule Take 20 mg by mouth daily.    Marland Kitchen HYDROcodone-acetaminophen (NORCO/VICODIN) 5-325 MG tablet Take 1 tablet by mouth every 4 (four) hours as needed for moderate pain.    Marland Kitchen loperamide (IMODIUM) 2 MG capsule Take 1 capsule (2 mg total) by mouth every 6 (six) hours as needed for diarrhea or loose stools. 30 capsule 0  . megestrol (MEGACE) 400 MG/10ML suspension Take 10 mLs (400 mg total) by mouth daily. 480 mL 3  . Melatonin 3 MG TABS Take 3 mg by mouth at bedtime.     . mirtazapine (REMERON) 7.5 MG tablet Take 7.5 mg by mouth at bedtime.    . ondansetron (ZOFRAN) 4 MG tablet Take 1 tablet (4 mg total) by mouth every 6 (six) hours as  needed for nausea. 20 tablet 0  . polyvinyl alcohol (LIQUIFILM TEARS) 1.4 % ophthalmic solution Place 1 drop into both eyes 2 (two) times daily.    . pravastatin (PRAVACHOL) 20 MG tablet Take 10 mg by mouth at bedtime.    . senna (SENOKOT) 8.6 MG TABS tablet Take 1 tablet by mouth 2 (two) times daily.    . SPS 15 GM/60ML suspension Take 60 mLs by mouth daily.    . tamsulosin (FLOMAX) 0.4 MG CAPS capsule Take 0.4 mg by mouth daily.    Marland Kitchen trolamine salicylate (ASPERCREME) 10 % cream Apply 1 application topically daily. Right lateral ankle      ALLERGIES:  No Known Allergies  FAM HX: Family History  Problem Relation Age of Onset  . Hypertension Mother   . Hypertension Father     Social History:   reports that she has been smoking cigarettes. She has never used smokeless tobacco. She reports that she does not drink alcohol or use drugs.  ROS: 12 system ROS negative except per HPI.   Blood pressure 122/69, pulse 93, temperature 98.5 F (36.9 C), temperature source Oral, resp. rate 16, height 5\' 9"  (1.753 m), weight 70.6 kg, SpO2 99 %. PHYSICAL EXAM: Gen: sitting comfortably in bed  Eyes: anicteric ENT:  MM tacky Neck: no JVD CV:  RRR, no S4 or rub Abd: soft, no TTP Lungs: clear to bases GU: foley with clear yellow urine Extr: no edema Neuro: oriented and conversant Skin: warm and dry   Results for orders placed or performed during the hospital encounter of 05/10/19 (from the past 48 hour(s))  CBC with Differential     Status: Abnormal   Collection Time: 05/10/19  1:06 PM  Result Value Ref Range   WBC 8.8 4.0 - 10.5 K/uL   RBC 3.07 (L) 3.87 - 5.11 MIL/uL   Hemoglobin 9.0 (L) 12.0 - 15.0 g/dL   HCT 30.3 (L) 36.0 - 46.0 %   MCV 98.7 80.0 - 100.0 fL   MCH 29.3 26.0 - 34.0 pg   MCHC 29.7 (L) 30.0 - 36.0 g/dL   RDW 16.6 (H) 11.5 - 15.5 %   Platelets 339 150 - 400 K/uL   nRBC 0.0 0.0 - 0.2 %   Neutrophils Relative % 46 %   Neutro Abs 4.1 1.7 - 7.7 K/uL   Lymphocytes Relative  45 %   Lymphs Abs 3.9 0.7 - 4.0 K/uL   Monocytes Relative 7 %   Monocytes Absolute 0.6 0.1 - 1.0 K/uL   Eosinophils Relative 1 %   Eosinophils Absolute 0.1 0.0 - 0.5 K/uL   Basophils Relative 1 %   Basophils Absolute 0.1 0.0 - 0.1 K/uL  Immature Granulocytes 0 %   Abs Immature Granulocytes 0.02 0.00 - 0.07 K/uL    Comment: Performed at [facility], [place] [address] [place], [place] [id number]  Basic metabolic panel     Status: Abnormal   Collection Time: 05/10/19  1:06 PM  Result Value Ref Range   Sodium 135 135 - 145 mmol/L   Potassium 6.6 (HH) 3.5 - 5.1 mmol/L    Comment: NO VISIBLE HEMOLYSIS REPEATED TO VERIFY CRITICAL RESULT CALLED TO, READ BACK BY AND VERIFIED WITH: ZULETA,C. RN AT 1352 05/10/19 MULLINS,T    Chloride 113 (H) 98 - 111 mmol/L   CO2 17 (L) 22 - 32 mmol/L   Glucose, Bld 91 70 - 99 mg/dL   BUN 37 (H) 8 - 23 mg/dL   Creatinine, Ser 2.17 (H) 0.44 - 1.00 mg/dL   Calcium 8.2 (L) 8.9 - 10.3 mg/dL   GFR calc non Af Amer 21 (L) >60 mL/min   GFR calc Af Amer 25 (L) >60 mL/min   Anion gap 5 5 - 15    Comment: Performed at [facility], [place] [address] [place], [place] [address]  Urinalysis, Routine w reflex microscopic     Status: Abnormal   Collection Time: 05/10/19  3:19 PM  Result Value Ref Range   Color, Urine YELLOW YELLOW   APPearance HAZY (A) CLEAR   Specific Gravity, Urine 1.010 1.005 - 1.030   pH 6.0 5.0 - 8.0   Glucose, UA NEGATIVE NEGATIVE mg/dL   Hgb urine dipstick NEGATIVE NEGATIVE   Bilirubin Urine NEGATIVE NEGATIVE   Ketones, ur NEGATIVE NEGATIVE mg/dL   Protein, ur 30 (A) NEGATIVE mg/dL   Nitrite NEGATIVE NEGATIVE   Leukocytes,Ua SMALL (A) NEGATIVE   RBC / HPF 0-5 0 - 5 RBC/hpf   WBC, UA 21-50 0 - 5 WBC/hpf   Bacteria, UA MANY (A) NONE SEEN   Squamous Epithelial / LPF 0-5 0 - 5    Comment: Performed at [facility], [place] [address], [place], [place] [address]  SARS CORONAVIRUS 2 (TAT 6-24  HRS) Nasopharyngeal Nasopharyngeal Swab     Status: None   Collection Time: 05/10/19  3:19 PM   Specimen: Nasopharyngeal Swab  Result Value Ref Range   SARS Coronavirus 2 NEGATIVE NEGATIVE    Comment: (NOTE) SARS-CoV-2 target nucleic acids are NOT DETECTED. The SARS-CoV-2 RNA is generally detectable in upper and lower respiratory specimens during the acute phase of infection. Negative results do not preclude SARS-CoV-2 infection, do not rule out co-infections with other pathogens, and should not be used as the sole basis for treatment or other patient management decisions. Negative results must be combined with clinical observations, patient history, and epidemiological information. The expected result is Negative. Fact Sheet for Patients: SugarRoll.be Fact Sheet for Healthcare Providers: https://www.woods-mathews.com/ This test is not yet approved or cleared by the [place] FDA and  has been authorized for detection and/or diagnosis of SARS-CoV-2 by FDA under an Emergency Use Authorization (EUA). This EUA will remain  in effect (meaning this test can be used) for the duration of the COVID-19 declaration under Section 56 4(b)(1) of the Act, 21 U.S.C. section 360bbb-3(b)(1), unless the authorization is terminated or revoked sooner. Performed at [facility], [place] [address], [place], [place] [address]   CBC     Status: Abnormal   Collection Time: 05/10/19  5:23 PM  Result Value Ref Range   WBC 9.9 4.0 - 10.5 K/uL  RBC 3.28 (L) 3.87 - 5.11 MIL/uL   Hemoglobin 9.6 (L) 12.0 - 15.0 g/dL   HCT 32.8 (L) 36.0 - 46.0 %   MCV 100.0 80.0 - 100.0 fL   MCH 29.3 26.0 - 34.0 pg   MCHC 29.3 (L) 30.0 - 36.0 g/dL   RDW 16.5 (H) 11.5 - 15.5 %   Platelets 357 150 - 400 K/uL   nRBC 0.0 0.0 - 0.2 %    Comment: Performed at [facility], [place] [address], [place], [place] [address]  Creatinine, serum     Status: Abnormal    Collection Time: 05/10/19  5:23 PM  Result Value Ref Range   Creatinine, Ser 2.14 (H) 0.44 - 1.00 mg/dL   GFR calc non Af Amer 22 (L) >60 mL/min   GFR calc Af Amer 25 (L) >60 mL/min    Comment: Performed at [facility], [place] [address] [place], [place] [id number]  Basic metabolic panel     Status: Abnormal   Collection Time: 05/10/19  5:23 PM  Result Value Ref Range   Sodium 138 135 - 145 mmol/L   Potassium 6.1 (H) 3.5 - 5.1 mmol/L   Chloride 113 (H) 98 - 111 mmol/L   CO2 17 (L) 22 - 32 mmol/L   Glucose, Bld 105 (H) 70 - 99 mg/dL   BUN 35 (H) 8 - 23 mg/dL   Creatinine, Ser 2.15 (H) 0.44 - 1.00 mg/dL   Calcium 8.7 (L) 8.9 - 10.3 mg/dL   GFR calc non Af Amer 22 (L) >60 mL/min   GFR calc Af Amer 25 (L) >60 mL/min   Anion gap 8 5 - 15    Comment: Performed at [facility], [place] [address] [place], [place] [address]  Phosphorus     Status: None   Collection Time: 05/10/19  5:23 PM  Result Value Ref Range   Phosphorus 4.2 2.5 - 4.6 mg/dL    Comment: Performed at [facility], [place] [address] [place], [place] [id number]  Basic metabolic panel     Status: Abnormal   Collection Time: 05/11/19  5:23 AM  Result Value Ref Range   Sodium 139 135 - 145 mmol/L   Potassium 6.1 (H) 3.5 - 5.1 mmol/L   Chloride 117 (H) 98 - 111 mmol/L   CO2 16 (L) 22 - 32 mmol/L   Glucose, Bld 158 (H) 70 - 99 mg/dL   BUN 38 (H) 8 - 23 mg/dL   Creatinine, Ser 2.36 (H) 0.44 - 1.00 mg/dL   Calcium 7.3 (L) 8.9 - 10.3 mg/dL   GFR calc non Af Amer 19 (L) >60 mL/min   GFR calc Af Amer 22 (L) >60 mL/min   Anion gap 6 5 - 15    Comment: Performed at [facility], [place] [address], [place],  [address]  CBC     Status: Abnormal   Collection Time: 05/11/19  5:23 AM  Result Value Ref Range   WBC 8.4 4.0 - 10.5 K/uL   RBC 2.85 (L) 3.87 - 5.11 MIL/uL   Hemoglobin 8.4 (L) 12.0 - 15.0 g/dL   HCT 28.8 (L) 36.0 - 46.0 %   MCV 101.1 (H) 80.0 -  100.0 fL   MCH 29.5 26.0 - 34.0 pg   MCHC 29.2 (L) 30.0 - 36.0 g/dL   RDW 16.9 (H) 11.5 - 15.5 %   Platelets 285 150 - 400 K/uL   nRBC 0.0 0.0 - 0.2 %  Comment: Performed at [facility], [place] [address] [place], [place] [id number]  Basic metabolic panel     Status: Abnormal   Collection Time: 05/11/19  2:57 PM  Result Value Ref Range   Sodium 141 135 - 145 mmol/L   Potassium 6.6 (HH) 3.5 - 5.1 mmol/L    Comment: REPEATED TO VERIFY NO VISIBLE HEMOLYSIS CRITICAL RESULT CALLED TO, READ BACK BY AND VERIFIED WITH: M.CORAH AT 1535 ON 05/11/19 BY N.THOMPSON    Chloride 118 (H) 98 - 111 mmol/L   CO2 18 (L) 22 - 32 mmol/L   Glucose, Bld 115 (H) 70 - 99 mg/dL   BUN 40 (H) 8 - 23 mg/dL   Creatinine, Ser 2.32 (H) 0.44 - 1.00 mg/dL   Calcium 7.1 (L) 8.9 - 10.3 mg/dL   GFR calc non Af Amer 20 (L) >60 mL/min   GFR calc Af Amer 23 (L) >60 mL/min   Anion gap 5 5 - 15    Comment: Performed at [facility], [place] [address], [place], [place] [address]  CK     Status: None   Collection Time: 05/11/19  4:06 PM  Result Value Ref Range   Total CK 60 38 - 234 U/L    Comment: Performed at [facility], [place] [address], [place], [place] [address]  Glucose, capillary     Status: None   Collection Time: 05/11/19  5:34 PM  Result Value Ref Range   Glucose-Capillary 79 70 - 99 mg/dL    US Renal  Result Date: 05/11/2019 CLINICAL DATA:  Acute renal injury EXAM: RENAL / URINARY TRACT ULTRASOUND COMPLETE COMPARISON:  04/23/2019 FINDINGS: Right Kidney: Renal measurements: 8.8 x 4.6 x 3.9 cm. = volume: 82 mL. No hydronephrosis is noted. A few small cysts are seen. The largest of these measures 1.3 cm. Left Kidney: Renal measurements: 9.8 x 5.5 x 4.0 cm. = volume: 114 mL. Echogenicity within normal limits. No mass or hydronephrosis visualized. Bladder: Decompressed by Foley catheter IMPRESSION: Right renal cyst better visualized than on prior CT examination. No  hydronephrosis is noted. Electronically Signed   By: [name] M.D.   On: 05/11/2019 14:13    Assessment/Plan **Hyperkalemia:  Similar admission recently and K improved after urinary obstruction relieved.  She's had persistent hyperkalemia this admission.  Suspect mtifactorial with mild obstruction, CKD, type 4 RTA (hyporeninemic hypoaldo), mild volume depletion.  To acutely manage have recommended giving IVF (NS 572mL then MIVF) and lasix to promote potassium excretion (if anything hypovolemic but loop diuretic will help excrete K).  D/C fluid restriction. Agree with continued lokelma.  CK normal.  BP on lower side, will r/o adrenal insufficiency with AM cortisol (though serum sodium normal).  Serial K checks planned overnight. I think this will improve.  May need to be on low K diet chronically.   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  Has rec'd some IV bicarb for hyperkalemia, will start po bicarb for now.   **AKI on CKD:  Appears recent baseline likely Cr ~2, was 1.8 on recent hospital discharge.  Currently with Cr 2.3.  Possibly continued mild urinary retention + hypovolemia leading to mild AKI on CKD.  Checking urine sodium and creatinine though really won't be particularly helpful unless low.    **Anemia, normocytic:  Stable, iron replete. No indications for ESA or transfusion.   **DM: A1c 5.7.  Per primary.    **PAD: no acute issue.  Justin Mend 05/11/2019, 5:39 PM

## 2019-05-11 NOTE — Progress Notes (Signed)
Initial Nutrition Assessment  RD working remotely.   DOCUMENTATION CODES:   Not applicable  INTERVENTION:  - will order Boost Breeze BID, each supplement provides 250 kcal and 9 grams of protein. - will order 30 mL Prostat BID, each supplement provides 100 kcal and 15 grams of protein. - continue to encourage PO intakes.    NUTRITION DIAGNOSIS:   Increased nutrient needs related to chronic illness, acute illness as evidenced by estimated needs.  GOAL:   Patient will meet greater than or equal to 90% of their needs  MONITOR:   PO intake, Supplement acceptance, Labs, Weight trends  REASON FOR ASSESSMENT:   Consult Assessment of nutrition requirement/status  ASSESSMENT:   78 y.o. female with history of CKD stage 4, type 2 DM, HTN, anemia of chronic disease, diabetic neuropathy, PVD/angioplasty and arthrectomy in 2016, and urinary retention. She presented to the ED from facility on 9/2 with hyperkalemia (7 mmol/l at facility). She was also hospitalized 2 weeks ago for hyperkalemia. She went to outpatient Nephrology appointment on 9/1 and was told to return in 1 year. In the ED she received lokelma and kayexalate.  Per RN flow sheet, patient consumed 15% of dinner last night. Patient reports appetite was good/at baseline until admission. She denies any chewing or swallowing difficulties and is able to feed herself at the facility. She denies any abdominal pain/pressure or nausea with PO intakes.   Per chart review, current weight is 156 lb and weight on 8/15 was 158 lb. This indicates 2 lb weight loss (1.3% body weight) in the past 2 weeks; not significant for time frame.   Per notes: - mild metabolic acidosis--likely d/t renal failure - hyperkalemia--no significant findings on EKG; checking cholesterol level; Nephrology consulted - anemia of chronic disease - chronic pain/diabetic neuropathy--DM with HgbA1c of 5.7& - moderate malnutrition--megace ordered   Labs reviewed; K:  6.1 mmol/l, Cl: 117 mmol/l, BUN: 38 mg/dl, creatinine: 2.36 mg/dl, Ca: 7.3 mg/dl, GFR: 22 ml/min.  Medications reviewed; 100 mg colace BID, 400 mg megace/day, 3 mg melatonin at bedtime, 5 mg lokelma x1 dose 9/2 and x3 doses 9/3, 15 g kayexalate x1 dose 9/2.     NUTRITION - FOCUSED PHYSICAL EXAM:  unable to complete at this time.   Diet Order:   Diet Order            Diet renal with fluid restriction Fluid restriction: 1200 mL Fluid; Room service appropriate? Yes; Fluid consistency: Thin  Diet effective now              EDUCATION NEEDS:   Not appropriate for education at this time  Skin:  Skin Assessment: Skin Integrity Issues: Skin Integrity Issues:: DTI DTI: R foot  Last BM:  9/2  Height:   Ht Readings from Last 1 Encounters:  05/10/19 5\' 9"  (1.753 m)    Weight:   Wt Readings from Last 1 Encounters:  05/10/19 70.6 kg    Ideal Body Weight:  65.9 kg  BMI:  Body mass index is 22.98 kg/m.  Estimated Nutritional Needs:   Kcal:  1700-1900 kcal  Protein:  65-75 grams  Fluid:  >/= 1.8 L/day     Stephanie Matin, MS, RD, LDN, [facility] Inpatient Clinical Dietitian Pager # [phone] After hours/weekend pager # [phone]

## 2019-05-11 NOTE — TOC Progression Note (Signed)
Transition of Care [facility]) - Progression Note    Patient Details  Name: Stephanie [facility] MRN: [id number] Date of Birth: [birth year]  [facility] of [facility]) CM/SW Contact  Adrena Nakamura, Juliann Pulse, RN Phone Number: 05/11/2019, 11:50 AM  Clinical Narrative: Confirmed w/[facility] [address]-rep Charlean Merl is from [place]. D/c plan to return.         Expected Discharge Plan and Services           Expected Discharge Date: (unknown)                                     Social Determinants of Health (SDOH) Interventions    Readmission Risk Interventions No flowsheet data found.

## 2019-05-12 DIAGNOSIS — E872 Acidosis, unspecified: Secondary | ICD-10-CM

## 2019-05-12 DIAGNOSIS — R627 Adult failure to thrive: Secondary | ICD-10-CM

## 2019-05-12 LAB — RENAL FUNCTION PANEL
Albumin: 3.1 g/dL — ABNORMAL LOW (ref 3.5–5.0)
Anion gap: 6 (ref 5–15)
BUN: 38 mg/dL — ABNORMAL HIGH (ref 8–23)
CO2: 19 mmol/L — ABNORMAL LOW (ref 22–32)
Calcium: 6.9 mg/dL — ABNORMAL LOW (ref 8.9–10.3)
Chloride: 117 mmol/L — ABNORMAL HIGH (ref 98–111)
Creatinine, Ser: 2.13 mg/dL — ABNORMAL HIGH (ref 0.44–1.00)
GFR calc Af Amer: 25 mL/min — ABNORMAL LOW (ref >60)
GFR calc non Af Amer: 22 mL/min — ABNORMAL LOW (ref >60)
Glucose, Bld: 90 mg/dL (ref 70–99)
Phosphorus: 5 mg/dL — ABNORMAL HIGH (ref 2.5–4.6)
Potassium: 5.6 mmol/L — ABNORMAL HIGH (ref 3.5–5.1)
Sodium: 142 mmol/L (ref 135–145)

## 2019-05-12 LAB — URINE CULTURE: Culture: NO GROWTH

## 2019-05-12 LAB — CORTISOL-AM, BLOOD: Cortisol - AM: 1.5 ug/dL — ABNORMAL LOW (ref 6.7–22.6)

## 2019-05-12 LAB — CREATININE, URINE, RANDOM: Creatinine, Urine: 109.38 mg/dL

## 2019-05-12 LAB — POTASSIUM
Potassium: 4.9 mmol/L (ref 3.5–5.1)
Potassium: 5.1 mmol/L (ref 3.5–5.1)
Potassium: 5.1 mmol/L (ref 3.5–5.1)
Potassium: 5.4 mmol/L — ABNORMAL HIGH (ref 3.5–5.1)
Potassium: 5.6 mmol/L — ABNORMAL HIGH (ref 3.5–5.1)
Potassium: 5.6 mmol/L — ABNORMAL HIGH (ref 3.5–5.1)

## 2019-05-12 LAB — GLUCOSE, CAPILLARY
Glucose-Capillary: 83 mg/dL (ref 70–99)
Glucose-Capillary: 244 mg/dL — ABNORMAL HIGH (ref 70–99)
Glucose-Capillary: 123 mg/dL — ABNORMAL HIGH (ref 70–99)
Glucose-Capillary: 128 mg/dL — ABNORMAL HIGH (ref 70–99)

## 2019-05-12 LAB — NA AND K (SODIUM & POTASSIUM), RAND UR
Potassium Urine: 30 mmol/L
Sodium, Ur: 105 mmol/L

## 2019-05-12 MED ORDER — SODIUM ZIRCONIUM CYCLOSILICATE 5 G PO PACK
5.0000 g | PACK | Freq: Three times a day (TID) | ORAL | Status: AC
  Administered 2019-05-12 – 2019-05-14 (×4): 5 g via ORAL
  Filled 2019-05-12 (×6): qty 1

## 2019-05-12 MED ORDER — COSYNTROPIN 0.25 MG IJ SOLR
0.2500 mg | Freq: Once | INTRAMUSCULAR | Status: AC
  Administered 2019-05-13: 0.25 mg via INTRAVENOUS
  Filled 2019-05-12: qty 0.25

## 2019-05-12 NOTE — Plan of Care (Signed)
  Problem: Nutrition: Goal: Adequate nutrition will be maintained Outcome: Progressing   Problem: Pain Managment: Goal: General experience of comfort will improve Outcome: Progressing   

## 2019-05-12 NOTE — Progress Notes (Signed)
[facility] Progress Note  Subjective: creat down 2.3 > 2.1 , and K+ down 6.6 > 5.1 today. Pt w/o complaints.   Vitals:   05/11/19 2055 05/12/19 0553 05/12/19 1320 05/12/19 1545  BP: (!) 108/59 (!) 100/59 125/64   Pulse: 80 71 82   Resp: 14 14 16    Temp: 97.8 F (36.6 C) 97.9 F (36.6 C) 99.9 F (37.7 C) 99.8 F (37.7 C)  TempSrc: Oral Oral Oral Oral  SpO2: 100% 98% 95%   Weight:      Height:        Inpatient medications: . aspirin  81 mg Oral Daily  . docusate sodium  100 mg Oral BID  . DULoxetine  20 mg Oral Daily  . feeding supplement  1 Container Oral BID BM  . feeding supplement (PRO-STAT SUGAR FREE 64)  30 mL Oral BID  . heparin  5,000 Units Subcutaneous Q8H  . insulin aspart  0-9 Units Subcutaneous TID WC  . megestrol  400 mg Oral Daily  . Melatonin  3 mg Oral QHS  . mirtazapine  7.5 mg Oral QHS  . polyvinyl alcohol  1 drop Both Eyes BID  . pravastatin  10 mg Oral QHS  . sodium bicarbonate  1,300 mg Oral TID  . sodium zirconium cyclosilicate  5 g Oral TID  . tamsulosin  0.4 mg Oral Daily   . sodium chloride 100 mL/hr at 05/12/19 [id number]   acetaminophen **OR** acetaminophen, bisacodyl, HYDROcodone-acetaminophen, senna-docusate    Exam: Gen: sitting comfortably in bed  Eyes: anicteric ENT:  MM tacky Neck: no JVD CV:  RRR, no S4 or rub Abd: soft, no TTP Lungs: clear to bases GU: foley with clear yellow urine Extr: no edema Neuro: oriented and conversant Skin: warm and dry   Lab Results Last 48 Hours  Results for orders placed or performed during the hospital encounter of 05/10/19 (from the past 48 hour(s))  CBC with Differential     Status: Abnormal   Collection Time: 05/10/19  1:06 PM  Result Value Ref Range   WBC 8.8 4.0 - 10.5 K/uL   RBC 3.07 (L) 3.87 - 5.11 MIL/uL   Hemoglobin 9.0 (L) 12.0 - 15.0 g/dL   HCT 30.3 (L) 36.0 - 46.0 %   MCV 98.7 80.0 - 100.0 fL   MCH 29.3 26.0 - 34.0 pg   MCHC 29.7 (L) 30.0 - 36.0 g/dL   RDW  16.6 (H) 11.5 - 15.5 %   Platelets 339 150 - 400 K/uL   nRBC 0.0 0.0 - 0.2 %   Neutrophils Relative % 46 %   Neutro Abs 4.1 1.7 - 7.7 K/uL   Lymphocytes Relative 45 %   Lymphs Abs 3.9 0.7 - 4.0 K/uL   Monocytes Relative 7 %   Monocytes Absolute 0.6 0.1 - 1.0 K/uL   Eosinophils Relative 1 %   Eosinophils Absolute 0.1 0.0 - 0.5 K/uL   Basophils Relative 1 %   Basophils Absolute 0.1 0.0 - 0.1 K/uL   Immature Granulocytes 0 %   Abs Immature Granulocytes 0.02 0.00 - 0.07 K/uL    Comment: Performed at [facility], [place] [address] [place], [place] [id number]  Basic metabolic panel     Status: Abnormal   Collection Time: 05/10/19  1:06 PM  Result Value Ref Range   Sodium 135 135 - 145 mmol/L   Potassium 6.6 (HH) 3.5 - 5.1 mmol/L    Comment: NO VISIBLE HEMOLYSIS REPEATED TO VERIFY CRITICAL  RESULT CALLED TO, READ BACK BY AND VERIFIED WITH: ZULETA,C. RN AT 1352 05/10/19 MULLINS,T    Chloride 113 (H) 98 - 111 mmol/L   CO2 17 (L) 22 - 32 mmol/L   Glucose, Bld 91 70 - 99 mg/dL   BUN 37 (H) 8 - 23 mg/dL   Creatinine, Ser 2.17 (H) 0.44 - 1.00 mg/dL   Calcium 8.2 (L) 8.9 - 10.3 mg/dL   GFR calc non Af Amer 21 (L) >60 mL/min   GFR calc Af Amer 25 (L) >60 mL/min   Anion gap 5 5 - 15    Comment: Performed at [facility], [place] [address], [place], [place] [address]  Urinalysis, Routine w reflex microscopic     Status: Abnormal   Collection Time: 05/10/19  3:19 PM  Result Value Ref Range   Color, Urine YELLOW YELLOW   APPearance HAZY (A) CLEAR   Specific Gravity, Urine 1.010 1.005 - 1.030   pH 6.0 5.0 - 8.0   Glucose, UA NEGATIVE NEGATIVE mg/dL   Hgb urine dipstick NEGATIVE NEGATIVE   Bilirubin Urine NEGATIVE NEGATIVE   Ketones, ur NEGATIVE NEGATIVE mg/dL   Protein, ur 30 (A) NEGATIVE mg/dL   Nitrite NEGATIVE NEGATIVE   Leukocytes,Ua SMALL (A) NEGATIVE   RBC / HPF 0-5 0 - 5 RBC/hpf   WBC, UA 21-50 0 - 5  WBC/hpf   Bacteria, UA MANY (A) NONE SEEN   Squamous Epithelial / LPF 0-5 0 - 5    Comment: Performed at [facility], [place] [address], [place], [place] [address]  SARS CORONAVIRUS 2 (TAT 6-24 HRS) Nasopharyngeal Nasopharyngeal Swab     Status: None   Collection Time: 05/10/19  3:19 PM   Specimen: Nasopharyngeal Swab  Result Value Ref Range   SARS Coronavirus 2 NEGATIVE NEGATIVE    Comment: (NOTE) SARS-CoV-2 target nucleic acids are NOT DETECTED. The SARS-CoV-2 RNA is generally detectable in upper and lower respiratory specimens during the acute phase of infection. Negative results do not preclude SARS-CoV-2 infection, do not rule out co-infections with other pathogens, and should not be used as the sole basis for treatment or other patient management decisions. Negative results must be combined with clinical observations, patient history, and epidemiological information. The expected result is Negative. Fact Sheet for Patients: SugarRoll.be Fact Sheet for Healthcare Providers: https://www.woods-mathews.com/ This test is not yet approved or cleared by the [place] FDA and  has been authorized for detection and/or diagnosis of SARS-CoV-2 by FDA under an Emergency Use Authorization (EUA). This EUA will remain  in effect (meaning this test can be used) for the duration of the COVID-19 declaration under Section 56 4(b)(1) of the Act, 21 U.S.C. section 360bbb-3(b)(1), unless the authorization is terminated or revoked sooner. Performed at [facility], [place] [address] [place], [place] [address]   CBC     Status: Abnormal   Collection Time: 05/10/19  5:23 PM  Result Value Ref Range   WBC 9.9 4.0 - 10.5 K/uL   RBC 3.28 (L) 3.87 - 5.11 MIL/uL   Hemoglobin 9.6 (L) 12.0 - 15.0 g/dL   HCT 32.8 (L) 36.0 - 46.0 %   MCV 100.0 80.0 - 100.0 fL   MCH 29.3 26.0 - 34.0 pg   MCHC 29.3 (L) 30.0 - 36.0 g/dL    RDW 16.5 (H) 11.5 - 15.5 %   Platelets 357 150 - 400 K/uL   nRBC 0.0 0.0 - 0.2 %    Comment: Performed at  [facility], [place] [address], [place], [place] [address]  Creatinine, serum     Status: Abnormal   Collection Time: 05/10/19  5:23 PM  Result Value Ref Range   Creatinine, Ser 2.14 (H) 0.44 - 1.00 mg/dL   GFR calc non Af Amer 22 (L) >60 mL/min   GFR calc Af Amer 25 (L) >60 mL/min    Comment: Performed at [facility], [place] [address], [place], [place] [id number]  Basic metabolic panel     Status: Abnormal   Collection Time: 05/10/19  5:23 PM  Result Value Ref Range   Sodium 138 135 - 145 mmol/L   Potassium 6.1 (H) 3.5 - 5.1 mmol/L   Chloride 113 (H) 98 - 111 mmol/L   CO2 17 (L) 22 - 32 mmol/L   Glucose, Bld 105 (H) 70 - 99 mg/dL   BUN 35 (H) 8 - 23 mg/dL   Creatinine, Ser 2.15 (H) 0.44 - 1.00 mg/dL   Calcium 8.7 (L) 8.9 - 10.3 mg/dL   GFR calc non Af Amer 22 (L) >60 mL/min   GFR calc Af Amer 25 (L) >60 mL/min   Anion gap 8 5 - 15    Comment: Performed at [facility], [place] [address], [place], [place] [address]  Phosphorus     Status: None   Collection Time: 05/10/19  5:23 PM  Result Value Ref Range   Phosphorus 4.2 2.5 - 4.6 mg/dL    Comment: Performed at [facility], [place] [address], [place], [place] [id number]  Basic metabolic panel     Status: Abnormal   Collection Time: 05/11/19  5:23 AM  Result Value Ref Range   Sodium 139 135 - 145 mmol/L   Potassium 6.1 (H) 3.5 - 5.1 mmol/L   Chloride 117 (H) 98 - 111 mmol/L   CO2 16 (L) 22 - 32 mmol/L   Glucose, Bld 158 (H) 70 - 99 mg/dL   BUN 38 (H) 8 - 23 mg/dL   Creatinine, Ser 2.36 (H) 0.44 - 1.00 mg/dL   Calcium 7.3 (L) 8.9 - 10.3 mg/dL   GFR calc non Af Amer 19 (L) >60 mL/min   GFR calc Af Amer 22 (L) >60 mL/min   Anion gap 6 5 - 15    Comment: Performed at [facility] [place] [address], [place], [place] [address]  CBC     Status: Abnormal   Collection Time: 05/11/19  5:23 AM  Result Value Ref Range   WBC 8.4 4.0 - 10.5 K/uL   RBC 2.85 (L) 3.87 - 5.11 MIL/uL   Hemoglobin 8.4 (L) 12.0 - 15.0 g/dL   HCT 28.8 (L) 36.0 - 46.0 %   MCV 101.1 (H) 80.0 - 100.0 fL   MCH 29.5 26.0 - 34.0 pg   MCHC 29.2 (L) 30.0 - 36.0 g/dL   RDW 16.9 (H) 11.5 - 15.5 %   Platelets 285 150 - 400 K/uL   nRBC 0.0 0.0 - 0.2 %    Comment: Performed at [facility], [place] [address], [place], [place] [id number]  Basic metabolic panel     Status: Abnormal   Collection Time: 05/11/19  2:57 PM  Result Value Ref Range   Sodium 141 135 - 145 mmol/L   Potassium 6.6 (HH) 3.5 - 5.1 mmol/L    Comment: REPEATED TO VERIFY NO VISIBLE HEMOLYSIS CRITICAL RESULT CALLED TO, READ BACK BY AND VERIFIED WITH: M.CORAH AT 1535 ON  05/11/19 BY N.THOMPSON    Chloride 118 (H) 98 - 111 mmol/L   CO2 18 (L) 22 - 32 mmol/L   Glucose, Bld 115 (H) 70 - 99 mg/dL   BUN 40 (H) 8 - 23 mg/dL   Creatinine, Ser 2.32 (H) 0.44 - 1.00 mg/dL   Calcium 7.1 (L) 8.9 - 10.3 mg/dL   GFR calc non Af Amer 20 (L) >60 mL/min   GFR calc Af Amer 23 (L) >60 mL/min   Anion gap 5 5 - 15    Comment: Performed at [facility], [place] [address] [place], [place] [address]  CK     Status: None   Collection Time: 05/11/19  4:06 PM  Result Value Ref Range   Total CK 60 38 - 234 U/L    Comment: Performed at [facility], [place] [address] [place], [place] [address]  Glucose, capillary     Status: None   Collection Time: 05/11/19  5:34 PM  Result Value Ref Range   Glucose-Capillary 79 70 - 99 mg/dL       Imaging Results (Last 48 hours)  US Renal  Result Date: 05/11/2019 CLINICAL DATA:  Acute renal injury EXAM: RENAL / URINARY TRACT ULTRASOUND COMPLETE COMPARISON:  04/23/2019 FINDINGS: Right Kidney: Renal measurements: 8.8 x 4.6 x 3.9 cm. = volume: 82 mL. No  hydronephrosis is noted. A few small cysts are seen. The largest of these measures 1.3 cm. Left Kidney: Renal measurements: 9.8 x 5.5 x 4.0 cm. = volume: 114 mL. Echogenicity within normal limits. No mass or hydronephrosis visualized. Bladder: Decompressed by Foley catheter IMPRESSION: Right renal cyst better visualized than on prior CT examination. No hydronephrosis is noted. Electronically Signed   By: [name] M.D.   On: 05/11/2019 14:13     Assessment/Plan **Hyperkalemia:  Similar admission recently and K improved after urinary obstruction relieved.  She's had persistent hyperkalemia this admission.  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  Has rec'd some IV bicarb for hyperkalemia, will start po bicarb for now.   **AKI on CKD:  Appears recent baseline likely Cr ~2, was 1.8 on recent hospital discharge. Possibly continued mild urinary retention + hypovolemia leading to mild AKI on CKD. Hx of recurrent urinary retention in 04/2018 and again in 04/2019   - creat here 2.3 > 2.1.  - renal US 8.8 / 9.8 cm kidneys w/o hydro - UA 20-50 wbc/ ++bact, 0-5 rbc, prot 30 - not c/w GN  **Anemia, normocytic:  Stable, iron replete. No indications for ESA or transfusion.   **DM: A1c 5.7.  Per primary.    **PAD: no acute issue.    [facility] 05/12/2019, 4:51 PM  Iron/TIBC/Ferritin/ %Sat    Component Value Date/Time   IRON 60 04/21/2019 1945   TIBC 237 (L) 04/21/2019 1945   FERRITIN 27 04/21/2019 1945   IRONPCTSAT 25 04/21/2019 1945   Recent Labs  Lab 05/12/19 0440  05/12/19 1225  NA 142  --   --   K 5.6*  5.6*   < > 5.1  CL 117*  --   --    CO2 19*  --   --  GLUCOSE 90  --   --   BUN 38*  --   --   CREATININE 2.13*  --   --   CALCIUM 6.9*  --   --   PHOS 5.0*  --   --   ALBUMIN 3.1*  --   --    < > = values in this interval not displayed.   No results for input(s): AST, ALT, ALKPHOS, BILITOT, PROT in the last 168 hours. Recent Labs  Lab 05/11/19 0523  WBC 8.4  HGB 8.4*  HCT 28.8*  PLT 285

## 2019-05-12 NOTE — Plan of Care (Signed)
  Problem: Education: Goal: Knowledge of General Education information will improve Description: Including pain rating scale, medication(s)/side effects and non-pharmacologic comfort measures Outcome: Progressing   Problem: Clinical Measurements: Goal: Ability to maintain clinical measurements within normal limits will improve Outcome: Progressing   Problem: Clinical Measurements: Goal: Will remain free from infection Outcome: Progressing   Problem: Clinical Measurements: Goal: Diagnostic test results will improve Outcome: Progressing   Problem: Clinical Measurements: Goal: Cardiovascular complication will be avoided Outcome: Progressing   

## 2019-05-12 NOTE — Progress Notes (Signed)
Assumed care from earlier RN. Agree with previous assessment. Eulas Post, RN

## 2019-05-12 NOTE — Progress Notes (Addendum)
PROGRESS NOTE  Stephanie Olson [id number] DOB: [birth year] DOA: 05/10/2019 PCP: Benito Mccreedy, MD  HPI/Recap of past 24 hours:  She denies pain, very pleasant, but appear slightly confused about the time and place vs difficulty with word finding, suspect this is baseline  Potassium is improving, but remain above normal, urine output 1.6liters, urine is clear in foley  Assessment/Plan: Active Problems:   Hyperkalemia   Acute urinary retention  Hyperkalemia/AKI/CKDIII/metabolic acidosis:  --per chart review she had h/o hydronephrosis from urinary retention, will get renal US and bladder scan -Ua + bacteria, she denies symptom, no fever, no leukocytosis, will hold off abx, urine culture in process -k improving but remain elevated, continue ivf, bicarb supplement, lokelma -nephrology consulted, will follow recommendation   Acute recurrent Urinary retention : -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  Stable. -Recheck as needed   Chronic pain/diabetic neuropathy: A1c 5.7% on 04/21/2019.  Not on diabetic agents. -Continue home Cymbalta. 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 vessel ischemia On asa/statin  PVD:  -Per chart review she is s/p right femoral balloon angioplasty in 01/2015 -she denies leg pain -Continue home aspirin and statin   Chronic pressure ulcer right lateral ankle, presents on admission  Wound care consulted   FTT; she reports a long term care resident at snf since early this year, she is wheelchair bound at baseline    DVT Prophylaxis:heparin  Diet: carb modified, low fat, low potassium diet  Code Status: full  Family Communication: patient at bedside, able to reach son over the phone this pm, son updated  Disposition  Plan: she is from SNF longterm care, plan to return to snf once medically stable   Consultants:  Case discussed with nephrology Dr Johnney Ou  Procedures:  Foley placement on 9/3  Antibiotics:  none   Objective: BP (!) 100/59 (BP Location: Left Arm)   Pulse 71   Temp 97.9 F (36.6 C) (Oral)   Resp 14   Ht 5\' 9"  (1.753 m)   Wt 70.6 kg   SpO2 98%   BMI 22.98 kg/m   Intake/Output Summary (Last 24 hours) at 05/12/2019 0809 Last data filed at 05/12/2019 0600 Gross per 24 hour  Intake 2761.98 ml  Output 1650 ml  Net 1111.98 ml   Filed Weights   05/10/19 1216 05/10/19 1700  Weight: 72 kg 70.6 kg    Exam: Patient is examined daily including today on 05/12/2019, exams remain the same as of yesterday except that has changed    General:  NAD, slightly confused about the time and place but able to be reoriented   Cardiovascular: RRR  Respiratory: CTABL  Abdomen: Soft/ND/NT, positive BS  Musculoskeletal: No Edema, right lateral ankle pressure ulcer (chronic)  Neuro: alert,slightly confused about the time and place but able to be reoriented      Data Reviewed: Basic Metabolic Panel: Recent Labs  Lab 05/10/19 1306 05/10/19 1723 05/11/19 0523 05/11/19 1457 05/11/19 1606 05/11/19 2118 05/11/19 2331 05/12/19 0440  NA 135 138 139 141  --   --   --   --   K 6.6* 6.1* 6.1* 6.6* 6.4* 5.6* 5.4* 5.6*  CL 113* 113* 117* 118*  --   --   --   --   CO2 17* 17* 16* 18*  --   --   --   --  GLUCOSE 91 105* 158* 115*  --   --   --   --   BUN 37* 35* 38* 40*  --   --   --   --   CREATININE 2.17* 2.15*  2.14* 2.36* 2.32*  --   --   --   --   CALCIUM 8.2* 8.7* 7.3* 7.1*  --   --   --   --   PHOS  --  4.2  --   --   --   --   --   --    Liver Function Tests: No results for input(s): AST, ALT, ALKPHOS, BILITOT, PROT, ALBUMIN in the last 168 hours. No results for input(s): LIPASE, AMYLASE in the last 168 hours. No results for input(s): AMMONIA in the last 168 hours. CBC:  Recent Labs  Lab 05/10/19 1306 05/10/19 1723 05/11/19 0523  WBC 8.8 9.9 8.4  NEUTROABS 4.1  --   --   HGB 9.0* 9.6* 8.4*  HCT 30.3* 32.8* 28.8*  MCV 98.7 100.0 101.1*  PLT 339 357 285   Cardiac Enzymes:   Recent Labs  Lab 05/11/19 1606  CKTOTAL 60   BNP (last 3 results) No results for input(s): BNP in the last 8760 hours.  ProBNP (last 3 results) No results for input(s): PROBNP in the last 8760 hours.  CBG: Recent Labs  Lab 05/11/19 1734 05/11/19 2124  GLUCAP 79 128*    Recent Results (from the past 240 hour(s))  SARS CORONAVIRUS 2 (TAT 6-24 HRS) Nasopharyngeal Nasopharyngeal Swab     Status: None   Collection Time: 05/10/19  3:19 PM   Specimen: Nasopharyngeal Swab  Result Value Ref Range Status   SARS Coronavirus 2 NEGATIVE NEGATIVE Final    Comment: (NOTE) SARS-CoV-2 target nucleic acids are NOT DETECTED. The SARS-CoV-2 RNA is generally detectable in upper and lower respiratory specimens during the acute phase of infection. Negative results do not preclude SARS-CoV-2 infection, do not rule out co-infections with other pathogens, and should not be used as the sole basis for treatment or other patient management decisions. Negative results must be combined with clinical observations, patient history, and epidemiological information. The expected result is Negative. Fact Sheet for Patients: SugarRoll.be Fact Sheet for Healthcare Providers: https://www.woods-mathews.com/ This test is not yet approved or cleared by the [place] FDA and  has been authorized for detection and/or diagnosis of SARS-CoV-2 by FDA under an Emergency Use Authorization (EUA). This EUA will remain  in effect (meaning this test can be used) for the duration of the COVID-19 declaration under Section 56 4(b)(1) of the Act, 21 U.S.C. section 360bbb-3(b)(1), unless the authorization is terminated or revoked sooner. Performed at [facility], [place] [address], [place], [place] [address]      Studies: US Renal  Result Date: 05/11/2019 CLINICAL DATA:  Acute renal injury EXAM: RENAL / URINARY TRACT ULTRASOUND COMPLETE COMPARISON:  04/23/2019 FINDINGS: Right Kidney: Renal measurements: 8.8 x 4.6 x 3.9 cm. = volume: 82 mL. No hydronephrosis is noted. A few small cysts are seen. The largest of these measures 1.3 cm. Left Kidney: Renal measurements: 9.8 x 5.5 x 4.0 cm. = volume: 114 mL. Echogenicity within normal limits. No mass or hydronephrosis visualized. Bladder: Decompressed by Foley catheter IMPRESSION: Right renal cyst better visualized than on prior CT examination. No hydronephrosis is noted. Electronically Signed   By: [name] M.D.   On: 05/11/2019 14:13    Scheduled Meds: . aspirin  81 mg Oral  Daily  . docusate sodium  100 mg Oral BID  . DULoxetine  20 mg Oral Daily  . feeding supplement  1 Container Oral BID BM  . feeding supplement (PRO-STAT SUGAR FREE 64)  30 mL Oral BID  . heparin  5,000 Units Subcutaneous Q8H  . insulin aspart  0-9 Units Subcutaneous TID WC  . megestrol  400 mg Oral Daily  . Melatonin  3 mg Oral QHS  . mirtazapine  7.5 mg Oral QHS  . polyvinyl alcohol  1 drop Both Eyes BID  . pravastatin  10 mg Oral QHS  . sodium bicarbonate  1,300 mg Oral TID  . tamsulosin  0.4 mg Oral Daily    Continuous Infusions: . sodium chloride 100 mL/hr at 05/11/19 2100     Time spent: 29mins I have personally reviewed and interpreted on  05/12/2019 daily labs, tele strips, imagings as discussed above under date review session and assessment and plans.  I reviewed all nursing notes, pharmacy notes, consultant notes,  vitals, pertinent old records  I have discussed plan of care as described above with RN , patient  on 05/12/2019   Florencia Reasons MD, PhD, FACP  [facility] Pager [phone]. If 7PM-7AM, please contact night-coverage at [email or web address], password [facility] 05/12/2019, 8:09 AM  LOS: 1 day

## 2019-05-13 DIAGNOSIS — E119 Type 2 diabetes mellitus without complications: Secondary | ICD-10-CM

## 2019-05-13 DIAGNOSIS — E2740 Unspecified adrenocortical insufficiency: Secondary | ICD-10-CM

## 2019-05-13 LAB — BASIC METABOLIC PANEL
Anion gap: 7 (ref 5–15)
BUN: 38 mg/dL — ABNORMAL HIGH (ref 8–23)
CO2: 20 mmol/L — ABNORMAL LOW (ref 22–32)
Calcium: 6.9 mg/dL — ABNORMAL LOW (ref 8.9–10.3)
Chloride: 115 mmol/L — ABNORMAL HIGH (ref 98–111)
Creatinine, Ser: 1.88 mg/dL — ABNORMAL HIGH (ref 0.44–1.00)
GFR calc Af Amer: 30 mL/min — ABNORMAL LOW (ref >60)
GFR calc non Af Amer: 25 mL/min — ABNORMAL LOW (ref >60)
Glucose, Bld: 131 mg/dL — ABNORMAL HIGH (ref 70–99)
Potassium: 5.1 mmol/L (ref 3.5–5.1)
Sodium: 142 mmol/L (ref 135–145)

## 2019-05-13 LAB — CBC WITH DIFFERENTIAL/PLATELET
Abs Immature Granulocytes: 0.01 10*3/uL (ref 0.00–0.07)
Basophils Absolute: 0 10*3/uL (ref 0.0–0.1)
Basophils Relative: 1 %
Eosinophils Absolute: 0.1 10*3/uL (ref 0.0–0.5)
Eosinophils Relative: 2 %
HCT: 26.7 % — ABNORMAL LOW (ref 36.0–46.0)
Hemoglobin: 7.8 g/dL — ABNORMAL LOW (ref 12.0–15.0)
Immature Granulocytes: 0 %
Lymphocytes Relative: 49 %
Lymphs Abs: 3.7 10*3/uL (ref 0.7–4.0)
MCH: 29.3 pg (ref 26.0–34.0)
MCHC: 29.2 g/dL — ABNORMAL LOW (ref 30.0–36.0)
MCV: 100.4 fL — ABNORMAL HIGH (ref 80.0–100.0)
Monocytes Absolute: 0.6 10*3/uL (ref 0.1–1.0)
Monocytes Relative: 8 %
Neutro Abs: 3.1 10*3/uL (ref 1.7–7.7)
Neutrophils Relative %: 40 %
Platelets: 254 10*3/uL (ref 150–400)
RBC: 2.66 MIL/uL — ABNORMAL LOW (ref 3.87–5.11)
RDW: 17.2 % — ABNORMAL HIGH (ref 11.5–15.5)
WBC: 7.6 10*3/uL (ref 4.0–10.5)
nRBC: 0 % (ref 0.0–0.2)

## 2019-05-13 LAB — GLUCOSE, CAPILLARY
Glucose-Capillary: 146 mg/dL — ABNORMAL HIGH (ref 70–99)
Glucose-Capillary: 126 mg/dL — ABNORMAL HIGH (ref 70–99)
Glucose-Capillary: 156 mg/dL — ABNORMAL HIGH (ref 70–99)
Glucose-Capillary: 271 mg/dL — ABNORMAL HIGH (ref 70–99)

## 2019-05-13 LAB — ACTH STIMULATION, 3 TIME POINTS
Cortisol, 30 Min: 9.7 ug/dL
Cortisol, 60 Min: 13.6 ug/dL
Cortisol, Base: 1.2 ug/dL

## 2019-05-13 LAB — FOLATE: Folate: 6.4 ng/mL (ref >5.9)

## 2019-05-13 LAB — VITAMIN B12: Vitamin B-12: 352 pg/mL (ref 180–914)

## 2019-05-13 MED ORDER — SODIUM CHLORIDE 0.9 % IV SOLN
INTRAVENOUS | Status: DC
  Administered 2019-05-13 – 2019-05-14 (×2): via INTRAVENOUS

## 2019-05-13 MED ORDER — FLUDROCORTISONE ACETATE 0.1 MG PO TABS: 0.1000 mg | ORAL_TABLET | Freq: Every day | ORAL | Status: DC

## 2019-05-13 MED ORDER — FOLIC ACID 1 MG PO TABS
1.0000 mg | ORAL_TABLET | Freq: Every day | ORAL | Status: DC
  Administered 2019-05-13 – 2019-05-14 (×2): 1 mg via ORAL
  Filled 2019-05-13 (×2): qty 1

## 2019-05-13 MED ORDER — VITAMIN B-12 1000 MCG PO TABS
1000.0000 ug | ORAL_TABLET | Freq: Every day | ORAL | Status: DC
  Administered 2019-05-13 – 2019-05-14 (×2): 1000 ug via ORAL
  Filled 2019-05-13 (×2): qty 1

## 2019-05-13 MED ORDER — SENNOSIDES-DOCUSATE SODIUM 8.6-50 MG PO TABS
1.0000 | ORAL_TABLET | Freq: Two times a day (BID) | ORAL | Status: DC
  Administered 2019-05-13 – 2019-05-14 (×3): 1 via ORAL
  Filled 2019-05-13 (×3): qty 1

## 2019-05-13 MED ORDER — BISACODYL 5 MG PO TBEC
5.0000 mg | DELAYED_RELEASE_TABLET | Freq: Once | ORAL | Status: AC
  Administered 2019-05-13: 5 mg via ORAL
  Filled 2019-05-13: qty 1

## 2019-05-13 MED ORDER — HYDROCORTISONE 5 MG PO TABS
5.0000 mg | ORAL_TABLET | Freq: Every day | ORAL | Status: DC
  Administered 2019-05-13 – 2019-05-14 (×2): 5 mg via ORAL
  Filled 2019-05-13 (×3): qty 1

## 2019-05-13 NOTE — Progress Notes (Signed)
PROGRESS NOTE  Stephanie Olson [id number] DOB: [birth year] DOA: 05/10/2019 PCP: Benito Mccreedy, MD  HPI/Recap of past 24 hours:  She denies pain, very pleasant, she remember Dr Karen Chafe came in this am   Potassium is improving, 5.1 this am, cr continue to trend down,   urine output 1.6liters, urine is clear in foley  No fever  Assessment/Plan: Active Problems:   Hyperkalemia   FTT (failure to thrive) in adult   Pressure injury of skin   Acute urinary retention   Metabolic acidemia  Hyperkalemia/AKI/CKDIII/metabolic acidosis:  --per chart review she had h/o hydronephrosis from urinary retention, will get renal US and bladder scan -Ua + bacteria, she denies symptom, no fever, no leukocytosis, will hold off abx, urine culture in process -k improving , today is 5.1, continue ivf fron one more day,  bicarb supplement, lokelma -nephrology consulted, will follow recommendation   Adrenal insufficiency  -An random cortisol is low at 1.2, 48min after stim is 9.7, 1hr after stim is 13.6 -adrenal insufficiency could cause hyperkalemia  -Case discussed with nephrology who recommends start cortef  And outpatient  endocrinology follow up  Acute recurrent Urinary retention : -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  Stable. -Recheck as needed   Chronic pain/diabetic neuropathy: A1c 5.7% on 04/21/2019.  Not on diabetic agents. -Continue home Cymbalta. 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 vessel ischemia On asa/statin  PVD:  -Per chart review she is s/p right femoral balloon angioplasty in 01/2015 -she denies leg pain -Continue home aspirin and statin   Chronic pressure ulcer right lateral  ankle, presents on admission  Wound care consulted   FTT; she reports a long term care resident at snf since early this year, she is wheelchair bound at baseline    DVT Prophylaxis:heparin  Diet: carb modified, low fat, low potassium diet  Code Status: full  Family Communication: patient at bedside,  son updated on 9/4  Disposition Plan: she is from SNF longterm care, plan to return to snf once medically stable   Consultants:   nephrology   Procedures:  Foley placement on 9/3  Antibiotics:  none   Objective: BP 110/61 (BP Location: Left Arm)   Pulse 81   Temp 98.7 F (37.1 C) (Oral)   Resp 13   Ht 5\' 9"  (1.753 m)   Wt 70.6 kg   SpO2 97%   BMI 22.98 kg/m   Intake/Output Summary (Last 24 hours) at 05/13/2019 [id number] Last data filed at 05/13/2019 [id number] Gross per 24 hour  Intake 3087.75 ml  Output 1450 ml  Net 1637.75 ml   Filed Weights   05/10/19 1216 05/10/19 1700  Weight: 72 kg 70.6 kg    Exam: Patient is examined daily including today on 05/13/2019, exams remain the same as of yesterday except that has changed    General:  NAD, slightly confused about the time and place but able to be reoriented   Cardiovascular: RRR  Respiratory: CTABL  Abdomen: Soft/ND/NT, positive BS  Musculoskeletal: No Edema, right lateral ankle pressure ulcer (chronic)  Neuro: alert,slightly confused about the time and place but able to be reoriented      Data Reviewed: Basic Metabolic Panel: Recent Labs  Lab  05/10/19 1723 05/11/19 0523 05/11/19 1457  05/12/19 0440 05/12/19 0845 05/12/19 1225 05/12/19 1628 05/12/19 2326 05/13/19 0505  NA 138 139 141  --  142  --   --   --   --  142  K 6.1* 6.1* 6.6*   < > 5.6*  5.6* 5.6* 5.1 5.1 4.9 5.1  CL 113* 117* 118*  --  117*  --   --   --   --  115*  CO2 17* 16* 18*  --  19*  --   --   --   --  20*  GLUCOSE 105* 158* 115*  --  90  --   --   --   --  131*  BUN 35* 38* 40*  --  38*  --   --   --   --  38*  CREATININE 2.15*   2.14* 2.36* 2.32*  --  2.13*  --   --   --   --  1.88*  CALCIUM 8.7* 7.3* 7.1*  --  6.9*  --   --   --   --  6.9*  PHOS 4.2  --   --   --  5.0*  --   --   --   --   --    < > = values in this interval not displayed.   Liver Function Tests: Recent Labs  Lab 05/12/19 0440  ALBUMIN 3.1*   No results for input(s): LIPASE, AMYLASE in the last 168 hours. No results for input(s): AMMONIA in the last 168 hours. CBC: Recent Labs  Lab 05/10/19 1306 05/10/19 1723 05/11/19 0523 05/13/19 0505  WBC 8.8 9.9 8.4 7.6  NEUTROABS 4.1  --   --  3.1  HGB 9.0* 9.6* 8.4* 7.8*  HCT 30.3* 32.8* 28.8* 26.7*  MCV 98.7 100.0 101.1* 100.4*  PLT 339 357 285 254   Cardiac Enzymes:   Recent Labs  Lab 05/11/19 1606  CKTOTAL 60   BNP (last 3 results) No results for input(s): BNP in the last 8760 hours.  ProBNP (last 3 results) No results for input(s): PROBNP in the last 8760 hours.  CBG: Recent Labs  Lab 05/12/19 0742 05/12/19 1151 05/12/19 1725 05/12/19 2113 05/13/19 0802  GLUCAP 83 244* 123* 128* 146*    Recent Results (from the past 240 hour(s))  SARS CORONAVIRUS 2 (TAT 6-24 HRS) Nasopharyngeal Nasopharyngeal Swab     Status: None   Collection Time: 05/10/19  3:19 PM   Specimen: Nasopharyngeal Swab  Result Value Ref Range Status   SARS Coronavirus 2 NEGATIVE NEGATIVE Final    Comment: (NOTE) SARS-CoV-2 target nucleic acids are NOT DETECTED. The SARS-CoV-2 RNA is generally detectable in upper and lower respiratory specimens during the acute phase of infection. Negative results do not preclude SARS-CoV-2 infection, do not rule out co-infections with other pathogens, and should not be used as the sole basis for treatment or other patient management decisions. Negative results must be combined with clinical observations, patient history, and epidemiological information. The expected result is Negative. Fact Sheet for Patients: SugarRoll.be Fact Sheet  for Healthcare Providers: https://www.woods-mathews.com/ This test is not yet approved or cleared by the [place] FDA and  has been authorized for detection and/or diagnosis of SARS-CoV-2 by FDA under an Emergency Use Authorization (EUA). This EUA will remain  in effect (meaning this test can be used) for the duration of the COVID-19 declaration under Section 56 4(b)(1) of the Act, 21 U.S.C. section 360bbb-3(b)(1), unless the  authorization is terminated or revoked sooner. Performed at [facility], [place] [address], [place], [place] [address]   Culture, Urine     Status: None   Collection Time: 05/11/19  1:17 PM   Specimen: Urine, Catheterized  Result Value Ref Range Status   Specimen Description   Final    URINE, CATHETERIZED Performed at [place] [address], [place], [place] [address]    Special Requests   Final    NONE Performed at [facility], [place] [address], [place], [place] [address]    Culture   Final    NO GROWTH Performed at [facility], [place] [address], [place], [place] [address]    Report Status 05/12/2019 FINAL  Final     Studies: No results found.  Scheduled Meds: . aspirin  81 mg Oral Daily  . docusate sodium  100 mg Oral BID  . DULoxetine  20 mg Oral Daily  . feeding supplement  1 Container Oral BID BM  . feeding supplement (PRO-STAT SUGAR FREE 64)  30 mL Oral BID  . heparin  5,000 Units Subcutaneous Q8H  . insulin aspart  0-9 Units Subcutaneous TID WC  . megestrol  400 mg Oral Daily  . Melatonin  3 mg Oral QHS  . mirtazapine  7.5 mg Oral QHS  . polyvinyl alcohol  1 drop Both Eyes BID  . pravastatin  10 mg Oral QHS  . sodium bicarbonate  1,300 mg Oral TID  . sodium zirconium cyclosilicate  5 g Oral TID  . tamsulosin  0.4 mg Oral Daily    Continuous Infusions: . sodium chloride 20 mL/hr at 05/13/19 [id number]     Time spent: 63mins, case discussed with nephrology  I have  personally reviewed and interpreted on  05/13/2019 daily labs, tele strips, imagings as discussed above under date review session and assessment and plans.  I reviewed all nursing notes, pharmacy notes, consultant notes,  vitals, pertinent old records  I have discussed plan of care as described above with RN , patient  on 05/13/2019   Florencia Reasons MD, PhD, FACP  [facility] Pager [phone]. If 7PM-7AM, please contact night-coverage at [email or web address], password [facility] 05/13/2019, 8:32 AM  LOS: 2 days

## 2019-05-13 NOTE — Progress Notes (Signed)
[facility] Progress Note  Subjective: creat down 1.8 today, pt is w/o complaints.  K 5.1.   Vitals:   05/12/19 1320 05/12/19 1545 05/12/19 2113 05/13/19 0436  BP: 125/64  118/70 110/61  Pulse: 82  92 81  Resp: 16  18 13   Temp: 99.9 F (37.7 C) 99.8 F (37.7 C) 98.8 F (37.1 C) 98.7 F (37.1 C)  TempSrc: Oral Oral Oral Oral  SpO2: 95%  97% 97%  Weight:      Height:        Inpatient medications: . aspirin  81 mg Oral Daily  . docusate sodium  100 mg Oral BID  . DULoxetine  20 mg Oral Daily  . feeding supplement  1 Container Oral BID BM  . feeding supplement (PRO-STAT SUGAR FREE 64)  30 mL Oral BID  . heparin  5,000 Units Subcutaneous Q8H  . insulin aspart  0-9 Units Subcutaneous TID WC  . megestrol  400 mg Oral Daily  . Melatonin  3 mg Oral QHS  . mirtazapine  7.5 mg Oral QHS  . polyvinyl alcohol  1 drop Both Eyes BID  . pravastatin  10 mg Oral QHS  . sodium bicarbonate  1,300 mg Oral TID  . sodium zirconium cyclosilicate  5 g Oral TID  . tamsulosin  0.4 mg Oral Daily   . sodium chloride 20 mL/hr at 05/13/19 [id number]   acetaminophen **OR** acetaminophen, bisacodyl, HYDROcodone-acetaminophen, senna-docusate    Exam: Gen: sitting comfortably in bed  Eyes: anicteric ENT:  MM tacky Neck: no JVD CV:  RRR, no S4 or rub Abd: soft, no TTP Lungs: clear to bases GU: foley with clear yellow urine Extr: no edema Neuro: oriented and conversant Skin: warm and dry   Lab Results Last 48 Hours  Results for orders placed or performed during the hospital encounter of 05/10/19 (from the past 48 hour(s))  CBC with Differential     Status: Abnormal   Collection Time: 05/10/19  1:06 PM  Result Value Ref Range   WBC 8.8 4.0 - 10.5 K/uL   RBC 3.07 (L) 3.87 - 5.11 MIL/uL   Hemoglobin 9.0 (L) 12.0 - 15.0 g/dL   HCT 30.3 (L) 36.0 - 46.0 %   MCV 98.7 80.0 - 100.0 fL   MCH 29.3 26.0 - 34.0 pg   MCHC 29.7 (L) 30.0 - 36.0 g/dL   RDW 16.6 (H) 11.5 - 15.5 %    Platelets 339 150 - 400 K/uL   nRBC 0.0 0.0 - 0.2 %   Neutrophils Relative % 46 %   Neutro Abs 4.1 1.7 - 7.7 K/uL   Lymphocytes Relative 45 %   Lymphs Abs 3.9 0.7 - 4.0 K/uL   Monocytes Relative 7 %   Monocytes Absolute 0.6 0.1 - 1.0 K/uL   Eosinophils Relative 1 %   Eosinophils Absolute 0.1 0.0 - 0.5 K/uL   Basophils Relative 1 %   Basophils Absolute 0.1 0.0 - 0.1 K/uL   Immature Granulocytes 0 %   Abs Immature Granulocytes 0.02 0.00 - 0.07 K/uL    Comment: Performed at [facility], [place] [address] [place], [place] [id number]  Basic metabolic panel     Status: Abnormal   Collection Time: 05/10/19  1:06 PM  Result Value Ref Range   Sodium 135 135 - 145 mmol/L   Potassium 6.6 (HH) 3.5 - 5.1 mmol/L    Comment: NO VISIBLE HEMOLYSIS REPEATED TO VERIFY CRITICAL RESULT CALLED TO, READ BACK BY AND  VERIFIED WITH: ZULETA,C. RN AT 1352 05/10/19 MULLINS,T    Chloride 113 (H) 98 - 111 mmol/L   CO2 17 (L) 22 - 32 mmol/L   Glucose, Bld 91 70 - 99 mg/dL   BUN 37 (H) 8 - 23 mg/dL   Creatinine, Ser 2.17 (H) 0.44 - 1.00 mg/dL   Calcium 8.2 (L) 8.9 - 10.3 mg/dL   GFR calc non Af Amer 21 (L) >60 mL/min   GFR calc Af Amer 25 (L) >60 mL/min   Anion gap 5 5 - 15    Comment: Performed at [facility], [place] [address] [place], [place] [address]  Urinalysis, Routine w reflex microscopic     Status: Abnormal   Collection Time: 05/10/19  3:19 PM  Result Value Ref Range   Color, Urine YELLOW YELLOW   APPearance HAZY (A) CLEAR   Specific Gravity, Urine 1.010 1.005 - 1.030   pH 6.0 5.0 - 8.0   Glucose, UA NEGATIVE NEGATIVE mg/dL   Hgb urine dipstick NEGATIVE NEGATIVE   Bilirubin Urine NEGATIVE NEGATIVE   Ketones, ur NEGATIVE NEGATIVE mg/dL   Protein, ur 30 (A) NEGATIVE mg/dL   Nitrite NEGATIVE NEGATIVE   Leukocytes,Ua SMALL (A) NEGATIVE   RBC / HPF 0-5 0 - 5 RBC/hpf   WBC, UA 21-50 0 - 5 WBC/hpf   Bacteria, UA  MANY (A) NONE SEEN   Squamous Epithelial / LPF 0-5 0 - 5    Comment: Performed at [facility], [place] [address] [place], [place] [address]  SARS CORONAVIRUS 2 (TAT 6-24 HRS) Nasopharyngeal Nasopharyngeal Swab     Status: None   Collection Time: 05/10/19  3:19 PM   Specimen: Nasopharyngeal Swab  Result Value Ref Range   SARS Coronavirus 2 NEGATIVE NEGATIVE    Comment: (NOTE) SARS-CoV-2 target nucleic acids are NOT DETECTED. The SARS-CoV-2 RNA is generally detectable in upper and lower respiratory specimens during the acute phase of infection. Negative results do not preclude SARS-CoV-2 infection, do not rule out co-infections with other pathogens, and should not be used as the sole basis for treatment or other patient management decisions. Negative results must be combined with clinical observations, patient history, and epidemiological information. The expected result is Negative. Fact Sheet for Patients: SugarRoll.be Fact Sheet for Healthcare Providers: https://www.woods-mathews.com/ This test is not yet approved or cleared by the [place] FDA and  has been authorized for detection and/or diagnosis of SARS-CoV-2 by FDA under an Emergency Use Authorization (EUA). This EUA will remain  in effect (meaning this test can be used) for the duration of the COVID-19 declaration under Section 56 4(b)(1) of the Act, 21 U.S.C. section 360bbb-3(b)(1), unless the authorization is terminated or revoked sooner. Performed at [facility], [place] [address] [place], [place] [address]   CBC     Status: Abnormal   Collection Time: 05/10/19  5:23 PM  Result Value Ref Range   WBC 9.9 4.0 - 10.5 K/uL   RBC 3.28 (L) 3.87 - 5.11 MIL/uL   Hemoglobin 9.6 (L) 12.0 - 15.0 g/dL   HCT 32.8 (L) 36.0 - 46.0 %   MCV 100.0 80.0 - 100.0 fL   MCH 29.3 26.0 - 34.0 pg   MCHC 29.3 (L) 30.0 - 36.0 g/dL   RDW 16.5 (H) 11.5 - 15.5  %   Platelets 357 150 - 400 K/uL   nRBC 0.0 0.0 - 0.2 %    Comment: Performed at [facility], [place]  [address] [place], [place] [address]  Creatinine, serum     Status: Abnormal   Collection Time: 05/10/19  5:23 PM  Result Value Ref Range   Creatinine, Ser 2.14 (H) 0.44 - 1.00 mg/dL   GFR calc non Af Amer 22 (L) >60 mL/min   GFR calc Af Amer 25 (L) >60 mL/min    Comment: Performed at [facility], [place] [address], [place], [place] [id number]  Basic metabolic panel     Status: Abnormal   Collection Time: 05/10/19  5:23 PM  Result Value Ref Range   Sodium 138 135 - 145 mmol/L   Potassium 6.1 (H) 3.5 - 5.1 mmol/L   Chloride 113 (H) 98 - 111 mmol/L   CO2 17 (L) 22 - 32 mmol/L   Glucose, Bld 105 (H) 70 - 99 mg/dL   BUN 35 (H) 8 - 23 mg/dL   Creatinine, Ser 2.15 (H) 0.44 - 1.00 mg/dL   Calcium 8.7 (L) 8.9 - 10.3 mg/dL   GFR calc non Af Amer 22 (L) >60 mL/min   GFR calc Af Amer 25 (L) >60 mL/min   Anion gap 8 5 - 15    Comment: Performed at [facility], [place] [address], [place], [place] [address]  Phosphorus     Status: None   Collection Time: 05/10/19  5:23 PM  Result Value Ref Range   Phosphorus 4.2 2.5 - 4.6 mg/dL    Comment: Performed at [facility], [place] [address], [place], [place] [id number]  Basic metabolic panel     Status: Abnormal   Collection Time: 05/11/19  5:23 AM  Result Value Ref Range   Sodium 139 135 - 145 mmol/L   Potassium 6.1 (H) 3.5 - 5.1 mmol/L   Chloride 117 (H) 98 - 111 mmol/L   CO2 16 (L) 22 - 32 mmol/L   Glucose, Bld 158 (H) 70 - 99 mg/dL   BUN 38 (H) 8 - 23 mg/dL   Creatinine, Ser 2.36 (H) 0.44 - 1.00 mg/dL   Calcium 7.3 (L) 8.9 - 10.3 mg/dL   GFR calc non Af Amer 19 (L) >60 mL/min   GFR calc Af Amer 22 (L) >60 mL/min   Anion gap 6 5 - 15    Comment: Performed at [facility], [place] [address], [place], [place] [address]   CBC     Status: Abnormal   Collection Time: 05/11/19  5:23 AM  Result Value Ref Range   WBC 8.4 4.0 - 10.5 K/uL   RBC 2.85 (L) 3.87 - 5.11 MIL/uL   Hemoglobin 8.4 (L) 12.0 - 15.0 g/dL   HCT 28.8 (L) 36.0 - 46.0 %   MCV 101.1 (H) 80.0 - 100.0 fL   MCH 29.5 26.0 - 34.0 pg   MCHC 29.2 (L) 30.0 - 36.0 g/dL   RDW 16.9 (H) 11.5 - 15.5 %   Platelets 285 150 - 400 K/uL   nRBC 0.0 0.0 - 0.2 %    Comment: Performed at [facility], [place] [address] [place], [place] [id number]  Basic metabolic panel     Status: Abnormal   Collection Time: 05/11/19  2:57 PM  Result Value Ref Range   Sodium 141 135 - 145 mmol/L   Potassium 6.6 (HH) 3.5 - 5.1 mmol/L    Comment: REPEATED TO VERIFY NO VISIBLE HEMOLYSIS CRITICAL RESULT CALLED TO, READ BACK BY AND VERIFIED WITH: M.CORAH AT 1535 ON 05/11/19 BY N.THOMPSON    Chloride  118 (H) 98 - 111 mmol/L   CO2 18 (L) 22 - 32 mmol/L   Glucose, Bld 115 (H) 70 - 99 mg/dL   BUN 40 (H) 8 - 23 mg/dL   Creatinine, Ser 2.32 (H) 0.44 - 1.00 mg/dL   Calcium 7.1 (L) 8.9 - 10.3 mg/dL   GFR calc non Af Amer 20 (L) >60 mL/min   GFR calc Af Amer 23 (L) >60 mL/min   Anion gap 5 5 - 15    Comment: Performed at [facility], [place] [address] [place], [place] [address]  CK     Status: None   Collection Time: 05/11/19  4:06 PM  Result Value Ref Range   Total CK 60 38 - 234 U/L    Comment: Performed at [facility], [place] [address], [place],  [address]  Glucose, capillary     Status: None   Collection Time: 05/11/19  5:34 PM  Result Value Ref Range   Glucose-Capillary 79 70 - 99 mg/dL       Imaging Results (Last 48 hours)  US Renal  Result Date: 05/11/2019 CLINICAL DATA:  Acute renal injury EXAM: RENAL / URINARY TRACT ULTRASOUND COMPLETE COMPARISON:  04/23/2019 FINDINGS: Right Kidney: Renal measurements: 8.8 x 4.6 x 3.9 cm. = volume: 82 mL. No hydronephrosis is noted. A few  small cysts are seen. The largest of these measures 1.3 cm. Left Kidney: Renal measurements: 9.8 x 5.5 x 4.0 cm. = volume: 114 mL. Echogenicity within normal limits. No mass or hydronephrosis visualized. Bladder: Decompressed by Foley catheter IMPRESSION: Right renal cyst better visualized than on prior CT examination. No hydronephrosis is noted. Electronically Signed   By: [name] M.D.   On: 05/11/2019 14:13     Assessment/Plan # Hyperkalemia:  Similar admission recently and K improved after urinary obstruction relieved.  She's had persistent hyperkalemia this admission.  Suspect mtifactorial with mild obstruction, CKD, type 4 RTA (hyporeninemic hypoaldo), mild volume depletion - Improved w/ IVF"s > dc - possible adrenal insuff - as below - recommend close f/u labs in OP setting, lokelma 10 gm 2x per week on dc for now and low K+ diet if possible at SNF - will we set up pt to be seen in our office for f/u of these issues - will sign off  # Low cortisol - possible adrenal insufficiency. Stim test is also on the low side. Per primary team.   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  # AKI on CKD:  Appears recent baseline likely Cr ~2, was 1.8 on recent hospital discharge. Possibly continued mild urinary retention + hypovolemia leading to mild AKI on CKD. Hx of recurrent urinary retention in 04/2018 and again in 04/2019   - creat down to baseline w/ fluids and holding diuretics - renal US 8.8 / 9.8 cm kidneys w/o hydro - UA 20-50 wbc/ ++bact, 0-5 rbc, prot 30 - not c/w GN  **Anemia, normocytic:  Stable, iron replete. No indications for ESA or transfusion.   **DM: A1c 5.7.  Per primary.    **PAD: no acute issue.    [facility] 05/13/2019, 11:16 AM  Iron/TIBC/Ferritin/ %Sat    Component Value Date/Time   IRON 60 04/21/2019 1945   TIBC 237 (L) 04/21/2019 1945   FERRITIN 27 04/21/2019 1945   IRONPCTSAT 25 04/21/2019  1945   Recent Labs  Lab 05/12/19 0440  05/13/19 0505  NA 142  --  142  K 5.6*  5.6*   < > 5.1  CL 117*  --  115*  CO2 19*  --  20*  GLUCOSE 90  --  131*  BUN 38*  --  38*  CREATININE 2.13*  --  1.88*  CALCIUM 6.9*  --  6.9*  PHOS 5.0*  --   --   ALBUMIN 3.1*  --   --    < > = values in this interval not displayed.   No results for input(s): AST, ALT, ALKPHOS, BILITOT, PROT in the last 168 hours. Recent Labs  Lab 05/13/19 0505  WBC 7.6  HGB 7.8*  HCT 26.7*  PLT 254

## 2019-05-13 NOTE — Progress Notes (Addendum)
CSW received a call from RN CM that a consult had been placed that pt is likely to be D/C'd back to [facility] where she has LTC on Sunday 9/6.  CSW called Winona Legato in admissions who stated pt would NOT need another COVID test but that they would need the pt's D/C summary prior to 12 noon tomorrow (Sun 9/6) in order to insure any new meds or any refills needed would be able to be obtained in prior to the closing of their pharmacy for the day.  RN updated by and provider updated by message who will have D/C summary completed early in the morning if pt is appropriate for D/C.   Per RN, pt had voiced she, "didn't like that place", referring to [facility] and was asking about other options and that pt's son is, "looking at other places".  CSW voiced to p's RN that pt has a LTC bed and that in general Care Coordination doe not facilitate switching of facilities to more preferred facilities from the hospital and that this change would have to occur from [facility] to another facility of their choice.  CSW provided the CSW's work cell and offered to speak to the pt if the pot so desired..  RN aware and took CSW's phone #.   CSW will continue to follow for D/C needs.  Alphonse Guild. Anmarie Fukushima, LCSW, LCAS, CSI Transitions of Care Clinical Social Worker Care Coordination Department Ph: [phone]

## 2019-05-14 LAB — CBC WITH DIFFERENTIAL/PLATELET
Abs Immature Granulocytes: 0.02 10*3/uL (ref 0.00–0.07)
Basophils Absolute: 0.1 10*3/uL (ref 0.0–0.1)
Basophils Relative: 1 %
Eosinophils Absolute: 0.1 10*3/uL (ref 0.0–0.5)
Eosinophils Relative: 2 %
HCT: 27.3 % — ABNORMAL LOW (ref 36.0–46.0)
Hemoglobin: 7.9 g/dL — ABNORMAL LOW (ref 12.0–15.0)
Immature Granulocytes: 0 %
Lymphocytes Relative: 43 %
Lymphs Abs: 3.5 10*3/uL (ref 0.7–4.0)
MCH: 29.4 pg (ref 26.0–34.0)
MCHC: 28.9 g/dL — ABNORMAL LOW (ref 30.0–36.0)
MCV: 101.5 fL — ABNORMAL HIGH (ref 80.0–100.0)
Monocytes Absolute: 0.6 10*3/uL (ref 0.1–1.0)
Monocytes Relative: 8 %
Neutro Abs: 3.7 10*3/uL (ref 1.7–7.7)
Neutrophils Relative %: 46 %
Platelets: 212 10*3/uL (ref 150–400)
RBC: 2.69 MIL/uL — ABNORMAL LOW (ref 3.87–5.11)
RDW: 17.1 % — ABNORMAL HIGH (ref 11.5–15.5)
WBC: 8.1 10*3/uL (ref 4.0–10.5)
nRBC: 0 % (ref 0.0–0.2)

## 2019-05-14 LAB — OCCULT BLOOD X 1 CARD TO LAB, STOOL: Fecal Occult Bld: NEGATIVE

## 2019-05-14 LAB — BASIC METABOLIC PANEL
Anion gap: 12 (ref 5–15)
BUN: 34 mg/dL — ABNORMAL HIGH (ref 8–23)
CO2: 17 mmol/L — ABNORMAL LOW (ref 22–32)
Calcium: 7.2 mg/dL — ABNORMAL LOW (ref 8.9–10.3)
Chloride: 115 mmol/L — ABNORMAL HIGH (ref 98–111)
Creatinine, Ser: 1.68 mg/dL — ABNORMAL HIGH (ref 0.44–1.00)
GFR calc Af Amer: 34 mL/min — ABNORMAL LOW (ref >60)
GFR calc non Af Amer: 29 mL/min — ABNORMAL LOW (ref >60)
Glucose, Bld: 111 mg/dL — ABNORMAL HIGH (ref 70–99)
Potassium: 4.7 mmol/L (ref 3.5–5.1)
Sodium: 144 mmol/L (ref 135–145)

## 2019-05-14 LAB — GLUCOSE, CAPILLARY
Glucose-Capillary: 129 mg/dL — ABNORMAL HIGH (ref 70–99)
Glucose-Capillary: 99 mg/dL (ref 70–99)
Glucose-Capillary: 133 mg/dL — ABNORMAL HIGH (ref 70–99)

## 2019-05-14 LAB — RETICULOCYTES
Immature Retic Fract: 20.9 % — ABNORMAL HIGH (ref 2.3–15.9)
RBC.: 2.69 MIL/uL — ABNORMAL LOW (ref 3.87–5.11)
Retic Count, Absolute: 33.9 10*3/uL (ref 19.0–186.0)
Retic Ct Pct: 1.3 % (ref 0.4–3.1)

## 2019-05-14 MED ORDER — CYANOCOBALAMIN 1000 MCG PO TABS: 1000.0000 ug | ORAL_TABLET | Freq: Every day | ORAL | Status: AC

## 2019-05-14 MED ORDER — PRO-STAT SUGAR FREE PO LIQD: 30.0000 mL | Freq: Two times a day (BID) | ORAL | 0 refills | Status: AC

## 2019-05-14 MED ORDER — HYDROCORTISONE 5 MG PO TABS: 5.0000 mg | ORAL_TABLET | Freq: Every day | ORAL | Status: DC

## 2019-05-14 MED ORDER — HYDROCODONE-ACETAMINOPHEN 5-325 MG PO TABS: 1.0000 | ORAL_TABLET | ORAL | 0 refills | Status: DC | PRN

## 2019-05-14 MED ORDER — SODIUM ZIRCONIUM CYCLOSILICATE 5 G PO PACK: 5.0000 g | PACK | ORAL | Status: DC

## 2019-05-14 MED ORDER — FOLIC ACID 1 MG PO TABS: 1.0000 mg | ORAL_TABLET | Freq: Every day | ORAL | Status: AC

## 2019-05-14 MED ORDER — SODIUM BICARBONATE 650 MG PO TABS: 1300.0000 mg | ORAL_TABLET | Freq: Three times a day (TID) | ORAL | Status: DC

## 2019-05-14 MED ORDER — SENNOSIDES-DOCUSATE SODIUM 8.6-50 MG PO TABS: 1.0000 | ORAL_TABLET | Freq: Two times a day (BID) | ORAL | Status: AC

## 2019-05-14 NOTE — Discharge Summary (Addendum)
Discharge Summary  Stephanie Olson [id number] DOB: Jan 17, 1941  PCP: Benito Mccreedy, MD  Admit date: 05/10/2019 Discharge date: 05/14/2019  Time spent: 60mins, more than 50% time spent on coordination of care.  Recommendations for Outpatient Follow-up:  1. F/u with SNF MD for hospital discharge follow up, repeat cbc/bmp at follow up.  2. F/u with [facility] nephrology for CKD 3. F/u with [facility] for recurrent urinary retention, foley management 4. F/u with [facility] for adrenal insufficiency (new diagnosis) 5. F/u with vascular surgery Dr Trula Slade as needed for PVD  Discharge Diagnoses:  Active Hospital Problems   Diagnosis Date Noted   Diet-controlled diabetes mellitus ([place])    Adrenal insufficiency (HCC)    Metabolic acidemia    Pressure injury of skin 04/23/2018   Acute urinary retention    Hyperkalemia 04/22/2018   FTT (failure to thrive) in adult 04/22/2018    Resolved Hospital Problems  No resolved problems to display.    Discharge Condition: stable  Diet recommendation: LOW POTASSIUM diet / heart healthy/carb modified  Nutrition consult obtained in the hospital Per nutrition:  "Consult received for low K diet education for patient with recurrent hyperkalemia and medical hx which includes stage 3-4 CKD. Full assessment completed by this RD on 9/3 at which time Boost Breeze and Prostat were both ordered BID; both supplements are low in K. Patient has accepted these supplements 100% of the time offered.   She is currently on Renal/Carb Mod diet and flow sheet indicates 50% completion of lunch yesterday and 25% of breakfast this AM. Recommend continuing Boost Breeze (or equivalent, such as Ensure Clear) and prostat outpatient.   Will place handouts from the Academy of Nutrition [employer] Dietetics in Discharge Instructions: High Potassium Foods list, Lower Potassium Foods list, Potassium Content of Foods list. "   Filed Weights   05/10/19  1216 05/10/19 1700  Weight: 72 kg 70.6 kg    History of present illness: (per admitting MD Dr Cyndia Skeeters) Patient coming from: Facility  Chief Complaint: Abnormal lab  HPI: LITZY CASALETTO is a 78 y.o. female with history of CKD-4, DM-2, HTN, anemia of chronic disease, diabetic neuropathy, PVD/angioplasty and arthrectomy in 2016 and urinary retention presenting from facility with hyperkalemia.  Reportedly potassium elevated to 7 at facility.  She was sent to ED for evaluation.  Patient was recently hospitalized about 2 weeks ago for hyperkalemia.   She was discharged to follow-up with nephrology.  She states that she was at her nephrology office yesterday and had a blood work.  She was told, to return in a year.  She denies any medication changes.  Denies URI symptoms, fever, chills, cough, shortness of breath, chest pain, nausea, vomiting, diarrhea, abdominal pain or UTI symptoms.  She denies focal neuro symptoms or palpitation.  Denies change to appetite.  Lives at facility.  She denies smoking cigarettes, drinking alcohol recreational drug use.  In ED, HDS.  K6.6.  Chloride 113.  Bicarb 17.  Anion gap 5.  Creatinine 2.17 (baseline).  BUN 37 (baseline).  Hgb 9.0 (baseline).  EKG NSR without peaked T waves or abnormal intervals.  Received albuterol. IV insulin, dextrose, Lokelma and Kayexalate ordered.  Nephrology consulted and hospitalist service was called for admission for hyperkalemia.  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):  --per chart review she had h/o hydronephrosis from urinary retention,  renal US this time no hydronephrosis -Ua + bacteria, urine culture no growth, she denies  symptom, no fever, no leukocytosis, -she received  ivf ,  bicarb supplement, lokelma, calcium gluconate, lasix, insulin/d50 shift, low potassium diet -nephrology consulted -k normalized at discharge She is discharged on lokelma mwf, sodium bicarb supplement  -f/u snf md and nephrology   Adrenal insufficiency  (new diagnosis) -An random cortisol is low at 1.2, 67min after stim is 9.7, 1hr after stim is 13.6 -adrenal insufficiency could cause hyperkalemia  -Case discussed with nephrology who recommends start cortef  And outpatient  endocrinology follow up  Acute recurrent Urinary retention : -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 Stable. -no overt bleeding , FOBT negative  -f/u with pcp and nephrology    Chronic pain/diabetic neuropathy: A1c 5.7% on 04/21/2019. Not on diabetic agents. -Continue home Cymbalta. 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 vessel ischemia On asa/statin, does appear to have underline mild memory deficit and slightly confused about time and place at baseline  PVD: -Per chart review she is s/p right femoral balloon angioplasty in 01/2015 by vascular surgery Dr Arita Miss -she denies leg pain -Continue home aspirin and statin F/u with vascular surgery as needed   Chronic pressure ulcer right lateral ankle, presents on admission  Wound care consulted   FTT; she reports a long term care resident at snf since early this year, she is wheelchair bound at baseline    DVT Prophylaxis while in the hospital :heparin  Diet: carb modified, low fat, low potassium diet  Code Status: full  Family Communication: patient at bedside,  son updated on 9/4,  left message for son on 9/6  Disposition Plan: she is from SNF longterm care, return to snf    Consultants:   nephrology   Procedures:  Foley placement on 9/3  Antibiotics:  none   Discharge Exam: BP 109/64 (BP Location: Right Arm)    Pulse 80    Temp 98 F (36.7 C) (Oral)    Resp 18    Ht 5\' 9"  (1.753 m)    Wt 70.6 kg    SpO2 96%    BMI 22.98 kg/m    General:  NAD, slightly confused about the time and place but able to be reoriented , foley in place with clear urine   Cardiovascular: RRR  Respiratory: CTABL  Abdomen: Soft/ND/NT, positive BS  Musculoskeletal: No Edema, right lateral ankle pressure ulcer (chronic)  Neuro: alert,slightly confused about the time and place but able to be reoriented      Discharge Instructions You were cared for by a hospitalist during your hospital stay. If you have any questions about your discharge medications or the care you received while you were in the hospital after you are discharged, you can call the unit and asked to speak with the hospitalist on call if the hospitalist that took care of you is not available. Once you are discharged, your primary care physician will handle any further medical issues. Please note that NO REFILLS for any discharge medications  will be authorized once you are discharged, as it is imperative that you return to your primary care physician (or establish a relationship with a primary care physician if you do not have one) for your aftercare needs so that they can reassess your need for medications and monitor your lab values.  Discharge Instructions    Diet Carb Modified   Complete by: As directed    Low potassium diet, low fat diet   Diet general   Complete by: As directed    LOW POTASSIUM DIET   Increase activity slowly   Complete by: As directed    Increase activity slowly   Complete by: As directed      Allergies as of 05/14/2019   No Known Allergies     Medication List    STOP  taking these medications   loperamide 2 MG capsule Commonly known as: IMODIUM   senna 8.6 MG Tabs tablet Commonly known as: SENOKOT   SPS 15 GM/60ML suspension Generic drug: sodium polystyrene     TAKE these medications   acetaminophen 500 MG tablet Commonly known as: TYLENOL Take 500 mg by mouth 2 (two) times daily. What changed: Another medication with the same name was removed. Continue taking this medication, and follow the directions you see here.   aspirin 81 MG chewable tablet Chew 81 mg by mouth daily.   bisacodyl 10 MG suppository Commonly known as: DULCOLAX Place 1 suppository (10 mg total) rectally daily as needed for moderate constipation.   cyanocobalamin 1000 MCG tablet Take 1 tablet (1,000 mcg total) by mouth daily. Start taking on: May 15, 2019   dicyclomine 10 MG capsule Commonly known as: BENTYL Take 10 mg by mouth 2 (two) times daily.   DULoxetine 20 MG capsule Commonly known as: CYMBALTA Take 20 mg by mouth daily.   feeding supplement (PRO-STAT SUGAR FREE 64) Liqd Take 30 mLs by mouth 2 (two) times daily.   folic acid 1 MG tablet Commonly known as: FOLVITE Take 1 tablet (1 mg total) by mouth daily. Start taking on: May 15, 2019   HYDROcodone-acetaminophen 5-325 MG tablet Commonly known as: NORCO/VICODIN Take 1 tablet by mouth every 4 (four) hours as needed for moderate pain.   hydrocortisone 5 MG tablet Commonly known as: CORTEF Take 1 tablet (5 mg total) by mouth daily. Start taking on: May 15, 2019   megestrol 400 MG/10ML suspension Commonly known as: MEGACE Take 10 mLs (400 mg total) by mouth daily.   Melatonin 3 MG Tabs Take 3 mg by mouth at bedtime.   mirtazapine 7.5 MG tablet Commonly known as: REMERON Take 7.5 mg by mouth at bedtime.   ondansetron 4 MG tablet Commonly known as: ZOFRAN Take 1 tablet (4 mg total) by mouth every 6 (six) hours as needed for nausea.   polyvinyl alcohol 1.4 % ophthalmic  solution Commonly known as: LIQUIFILM TEARS Place 1 drop into both eyes 2 (two) times daily.   pravastatin 20 MG tablet Commonly known as: PRAVACHOL Take 10 mg by mouth at bedtime.   senna-docusate 8.6-50 MG tablet Commonly known as: Senokot-S Take 1 tablet by mouth 2 (two) times daily.   sodium bicarbonate 650 MG tablet Take 2 tablets (1,300 mg total) by mouth 3 (three) times daily.   sodium zirconium cyclosilicate 5 g packet Commonly known as: LOKELMA Take 5 g by mouth every Monday, Wednesday, and Friday. Start taking on: May 15, 2019   tamsulosin 0.4 MG Caps capsule Commonly known as: FLOMAX Take  0.4 mg by mouth daily.   trolamine salicylate 10 % cream Commonly known as: ASPERCREME Apply 1 application topically daily. Right lateral ankle      No Known Allergies Follow-up Information    Serafina Mitchell, MD Follow up.   Specialties: Vascular Surgery, Cardiology Why: as needed for peripheral vascular disease, right lateral ankle ulcer  Contact information: [place] [address] [phone]        [place] [place] Follow up in 3 week(s).   Contact information: [place] [address] [facility] Follow up in 1 week(s).   Specialty: Internal Medicine Why: for adrenal insufficiency  Contact information: [address] [place] [phone] [place] Follow up.   Why: recurrent urinary retention, possible underline neurogenic bladder, indwelling foley management/removal per urology recommendation  Contact information: [place] [phone]           The results of significant diagnostics from this hospitalization (including imaging, microbiology, ancillary and laboratory) are listed below for reference.    Significant Diagnostic Studies: Ct Abdomen Pelvis Wo Contrast  Result  Date: 04/23/2019 CLINICAL DATA:  Acute kidney injury with bilateral hydronephrosis and distended urinary bladder. EXAM: CT ABDOMEN AND PELVIS WITHOUT CONTRAST TECHNIQUE: Multidetector CT imaging of the abdomen and pelvis was performed following the standard protocol without IV contrast. COMPARISON:  Renal ultrasound from earlier same day. CT abdomen dated 04/22/2018. FINDINGS: Lower chest: Patchy consolidation at the LEFT lung base, most likely atelectasis. Hepatobiliary: Layering stones within the moderately distended gallbladder. No focal liver abnormality. No bile duct dilatation seen. Pancreas: Unremarkable. No pancreatic ductal dilatation or surrounding inflammatory changes. Spleen: Normal in size without focal abnormality. Adrenals/Urinary Tract: Adrenal glands again appear bulbous bilaterally but without discrete nodule or mass. Small LEFT renal cyst, upper pole. Mild bilateral hydronephrosis, as demonstrated on today's renal ultrasound, decreased in severity compared to the earlier CT abdomen of 04/22/2018. No ureteral calculi identified. Bladder is now decompressed by Foley catheter. Stomach/Bowel: Moderate size stool ball in the rectal vault. No dilated large or small bowel loops. No evidence of bowel wall inflammation seen. Appendix is normal. Stomach is unremarkable, partially decompressed. Vascular/Lymphatic: Aortic atherosclerosis. No enlarged lymph nodes seen. Reproductive: Uterus and bilateral adnexa are unremarkable. Other: Small amount of ascites within the abdomen and pelvis. No abscess collection identified. No free intraperitoneal air. Musculoskeletal: No acute or suspicious osseous finding. Degenerative spondylosis of the lumbar spine, mild to moderate in degree. IMPRESSION: 1. Mild bilateral hydronephrosis, as demonstrated on today's renal ultrasound, decreased in severity compared to the earlier CT abdomen of 04/22/2018. No ureteral calculi identified. Bladder is now decompressed by Foley  catheter. 2. Small amount of ascites within the abdomen and pelvis. No abscess collection identified. 3. Cholelithiasis without evidence of acute cholecystitis. 4. Moderate to large stool ball in the rectal vault (impaction?). Aortic Atherosclerosis (ICD10-I70.0). Electronically Signed   By: Franki Cabot M.D.   On: 04/23/2019 14:22   US Renal  Result Date: 05/11/2019 CLINICAL DATA:  Acute renal injury EXAM: RENAL / URINARY TRACT ULTRASOUND COMPLETE COMPARISON:  04/23/2019 FINDINGS: Right Kidney: Renal measurements: 8.8 x 4.6 x 3.9 cm. = volume: 82 mL. No hydronephrosis is noted. A few small cysts are seen. The largest of these measures 1.3 cm. Left Kidney: Renal measurements: 9.8 x 5.5 x 4.0 cm. = volume: 114  mL. Echogenicity within normal limits. No mass or hydronephrosis visualized. Bladder: Decompressed by Foley catheter IMPRESSION: Right renal cyst better visualized than on prior CT examination. No hydronephrosis is noted. Electronically Signed   By: [name] M.D.   On: 05/11/2019 14:13   US Renal  Result Date: 04/23/2019 CLINICAL DATA:  Acute kidney injury.  Hydronephrosis. EXAM: RENAL / URINARY TRACT ULTRASOUND COMPLETE COMPARISON:  04/22/2018 FINDINGS: Right Kidney: Renal measurements: 9.5 x 5.1 x 4.4 cm = volume: 114 mL. No renal masses identified. Moderate to severe right hydronephrosis is seen which is increased since previous study. Left Kidney: Renal measurements: 8.0 x 3.6 x 4.5 cm = volume: 67 mL. No renal masses identified. Mild-to-moderate left hydronephrosis is seen, which is increased since previous study. Bladder: Urinary bladder is distended but otherwise unremarkable in appearance. IMPRESSION: Bilateral hydronephrosis which is increased since prior exam. Distended urinary bladder. Recommend clinical correlation for possible urinary retention. Electronically Signed   By: Marlaine Hind M.D.   On: 04/23/2019 10:04    Microbiology: Recent Results (from the past 240 hour(s))  SARS  CORONAVIRUS 2 (TAT 6-24 HRS) Nasopharyngeal Nasopharyngeal Swab     Status: None   Collection Time: 05/10/19  3:19 PM   Specimen: Nasopharyngeal Swab  Result Value Ref Range Status   SARS Coronavirus 2 NEGATIVE NEGATIVE Final    Comment: (NOTE) SARS-CoV-2 target nucleic acids are NOT DETECTED. The SARS-CoV-2 RNA is generally detectable in upper and lower respiratory specimens during the acute phase of infection. Negative results do not preclude SARS-CoV-2 infection, do not rule out co-infections with other pathogens, and should not be used as the sole basis for treatment or other patient management decisions. Negative results must be combined with clinical observations, patient history, and epidemiological information. The expected result is Negative. Fact Sheet for Patients: SugarRoll.be Fact Sheet for Healthcare Providers: https://www.woods-mathews.com/ This test is not yet approved or cleared by the [place] FDA and  has been authorized for detection and/or diagnosis of SARS-CoV-2 by FDA under an Emergency Use Authorization (EUA). This EUA will remain  in effect (meaning this test can be used) for the duration of the COVID-19 declaration under Section 56 4(b)(1) of the Act, 21 U.S.C. section 360bbb-3(b)(1), unless the authorization is terminated or revoked sooner. Performed at [facility], [place] [address], [place], [place] [address]   Culture, Urine     Status: None   Collection Time: 05/11/19  1:17 PM   Specimen: Urine, Catheterized  Result Value Ref Range Status   Specimen Description   Final    URINE, CATHETERIZED Performed at [facility] [address] [place], [place] [address]    Special Requests   Final    NONE Performed at [facility], [place] [address] [place], [place] [address]    Culture   Final    NO GROWTH Performed at [facility], [place] [address]  [place], [place] [address]    Report Status 05/12/2019 FINAL  Final     Labs: Basic Metabolic Panel: Recent Labs  Lab 05/10/19 1723 05/11/19 0523 05/11/19 1457  05/12/19 0440  05/12/19 1225 05/12/19 1628 05/12/19 2326 05/13/19 0505 05/14/19 0612  NA 138 139 141  --  142  --   --   --   --  142 144  K 6.1* 6.1* 6.6*   < > 5.6*   5.6*   < > 5.1 5.1 4.9 5.1 4.7  CL 113* 117* 118*  --  117*  --   --   --   --  115* 115*  CO2 17* 16* 18*  --  19*  --   --   --   --  20* 17*  GLUCOSE 105* 158* 115*  --  90  --   --   --   --  131* 111*  BUN 35* 38* 40*  --  38*  --   --   --   --  38* 34*  CREATININE 2.15*   2.14* 2.36* 2.32*  --  2.13*  --   --   --   --  1.88* 1.68*  CALCIUM 8.7* 7.3* 7.1*  --  6.9*  --   --   --   --  6.9* 7.2*  PHOS 4.2  --   --   --  5.0*  --   --   --   --   --   --    < > = values in this interval not displayed.   Liver Function Tests: Recent Labs  Lab 05/12/19 0440  ALBUMIN 3.1*   No results for input(s): LIPASE, AMYLASE in the last 168 hours. No results for input(s): AMMONIA in the last 168 hours. CBC: Recent Labs  Lab 05/10/19 1306 05/10/19 1723 05/11/19 0523 05/13/19 0505 05/14/19 0612  WBC 8.8 9.9 8.4 7.6 8.1  NEUTROABS 4.1  --   --  3.1 3.7  HGB 9.0* 9.6* 8.4* 7.8* 7.9*  HCT 30.3* 32.8* 28.8* 26.7* 27.3*  MCV 98.7 100.0 101.1* 100.4* 101.5*  PLT 339 357 285 254 212   Cardiac Enzymes: Recent Labs  Lab 05/11/19 1606  CKTOTAL 60   BNP: BNP (last 3 results) No results for input(s): BNP in the last 8760 hours.  ProBNP (last 3 results) No results for input(s): PROBNP in the last 8760 hours.  CBG: Recent Labs  Lab 05/13/19 1229 05/13/19 1652 05/13/19 2216 05/14/19 0406 05/14/19 0728  GLUCAP 126* 156* 271* 129* 99       Signed:  Florencia Reasons MD, PhD, FACP  [facility] 05/14/2019, 11:02 AM

## 2019-05-14 NOTE — Progress Notes (Signed)
Assumed care from previous RN, agree with assessment   05/13/19 2310  Hand-Off documentation  Report given to (Full Name) Ria Bush, RN  Report received from (Full Name) Rich Fuchs, RN  .

## 2019-05-14 NOTE — Discharge Instructions (Signed)
High-Potassium Foods List Food Amount Potassium (milligrams) Baked potato, with skin 1 medium 925 White beans, canned  cup 595 Avocado  fruit 487 Fish: halibut, tuna, cod, snapper 3 oz 480 Swiss chard  cup, cooked 480 Banana 1 medium 425 Spinach  cup cooked 420 Papaya 1 small 391 Milk: fat free, low fat, whole, buttermilk 1 cup (8 oz) 350-380 Lima beans  cup 353 Artichoke, cooked 1 medium 343 Soy milk 1 cup (8 oz) 287 Tomato or vegetable juice  cup (4 oz) 275 Dates 5 pieces 270 Raisins  cup (2 oz) 270 Potato, boiled  cup 255 Brussels sprouts  cup 250 [place] 3 oz 250 Sunflower or pumpkin seeds 1 oz 240 Yogurt  cup (4 oz) 238 Orange 1 fruit 237 Broccoli  cup 230 Cantaloupe  cup 215 Nuts: almonds, peanuts, hazelnuts, [place], cashew, mixed 1 oz 200 Tuna fish, canned 3 oz 200    Lower-Potassium Foods List Food Amount Potassium (milligrams) Applesauce  cup 90 Green beans  cup 90 Peas, green, frozen  cup 90 Raspberries  cup 90 Watermelon  cup 85 Cucumbers  cup 80 Oatmeal, regular, quick, or instant, unenriched  cup 80 Rice, wild  cup 80 Bread, whole wheat 1 slice 70 English muffin 1 each 65 Tea, brewed 6 oz 65 Blueberries  cup 60 Egg 1 large 60 Eggplant  cup 60 Rice, white or brown  cup 50 Tortilla, flour or corn 1 each 50 Cranberries  cup 45 Waffle, 4" 1 each 40 Bagel, 4": egg or plain  bagel 35 Nectar: papaya, mango, or pear  cup (4 oz) 35 Hummus 1 tbsp 32 Bread, white 1 slice 30 Spaghetti or macaroni, cooked  cup 30 Cheese 1 oz 20-30 Cranberry juice cocktail  cup (4 oz) 20    Potassium Content of Foods Eating more that the serving size for a moderate or low-potassium food will make it a high-potassium food. Foods made with high-potassium foods will also be high in potassium. Unless otherwise noted, all foods are cooked: meat is roasted, fish is cooked with dry heat, vegetables are cooked from fresh, and fruit is raw. This is a  guide. Actual values may vary depending on product and/or processing.  Foods that are frozen or canned may have higher potassium values.  Values are rounded to the closest 5-mg increment and may be averaged with similar foods in group.  High Potassium (more than 200 mg) Food Serving Potassium (mg) Apricots 2 raw or 5 dry 200 Artichoke 1 medium 345 Avocados, raw  each 245 Banana 1 medium 425 Beans, lima or baked, canned  cup 280 Beans, white, canned  cup 595 Beef roast 3 oz 320 Beef, ground 3 oz 270 Beets, cooked  cup 260 Bran muffin 2 oz 300 Broccoli  cup 230 Brussels sprouts  cup 250 Cantaloupe  cup 215 Cereal, 100% bran  cup 200-400 Cheeseburger, single, fast food 1 each 225-400 Chicken 3 oz 220 Clams, canned 3 oz 535 Crab 3 oz 225 Dates 5 dates 270 Dried beans and peas  cup 300-475 Figs, dried 2 each 260 Fish: halibut, tuna, cod, snapper 3 oz 480 Fish: salmon, haddock, swordfish, perch 3 oz 300 Fish, tuna, canned 3 oz 200 [place] fries, fast food 3 oz/small 470 Granola with fruit, nuts  cup 200 Grapefruit juice  cup 200 Greens, beet  cup 655 Honeydew melon  cup 200 Kale, raw 1 cup 300 Kiwi 1 medium 240 Kohlrabi, rutabaga, parsnips  cup 280 Lentils  cup 365 Mango 1 each  325 Milk, chocolate 1 cup 420 Milk, fat free, low fat, whole, buttermilk 1 cup 350-380 Molasses 1 Tbsp 295 Mushrooms  cup 280 Nectarine 1 each 275 Nuts: almonds, peanuts, hazelnuts, [place], cashew, mixed 1 oz 200 Nuts, pistachios 1 oz 295 Orange 1 each 240 Orange juice  cup 235 Papaya, medium  fruit 390 Peanut butter, chunky 2 Tbsp 240 Peanut butter, smooth 2 Tbsp 210 Pear 1 medium 200 Pomegranate 1 whole 400 Pomegranate juice  cup 215 Pork 3 oz 350 Potato chips, salted 1 oz 465 Potato, baked with skin 1 medium 925 Potatoes, boiled  cup 255 Potatoes, mashed  cup 330 Prune juice  cup 370 Prunes 5 each 305 Pudding, chocolate  cup 230 Pumpkin, canned  cup  250 Raisins, seedless  cup 270 Seeds, sunflower or pumpkin 1 oz 240 Soy milk 1 cup 300 Copyright Academy of Nutrition and Dietetics. This handout may be duplicated for client education. Page 3/5 Spinach  cup 420 Spinach, canned  cup 370 Sweet potato, baked with skin 1 medium 450 Swiss chard  cup 480 Tomato or vegetable juice  cup 275 Tomato sauce or puree  cup 400-550 Tomato, raw 1 medium 290 Tomatoes, canned  cup 200-300 [place] 3 oz 250 Wheat germ 1 oz 250 Winter squash  cup 250 Yogurt, plain or fruited 6 oz 260-435 Zucchini  cup 220  Moderate Potassium (50-200 mg) Food Serving Potassium (mg) Apple 1 each 150 Apple juice  cup 150 Applesauce  cup 90 Apricot nectar  cup 140 Asparagus, small spears  cup or 6 spears 155 Bagel, cinnamon raisin 1 each 130 Bagel, 4?: egg or plain 1 each 70 Beans, green  cup 90 Beans, yellow  cup 190 Beer, regular 12 oz 100 Beets, canned  cup 125 Blackberries  cup 115 Blueberries  cup 60 Bread, whole wheat 1 slice 70 Broccoli, raw  cup 145 Cabbage  cup 150 Carrots, cooked or raw  cup 180 Cauliflower, raw  cup 150 Celery, raw  cup 155 Cereal, bran flakes  cup 120-150 Cheese, cottage  cup 110 Cherries 10 each 150 Chocolate 1-oz bar 165 Coffee, brewed 6 oz 90 Corn  cup or 1 ear 195 Cucumbers  cup 80 Egg, large 1 each 60 Eggplant  cup 60 Endive, raw  cup 80 English muffin 1 each 65 Fish, orange roughy 3 oz 150 Frankfurter, beef/pork 1 each 75 Fruit cocktail  cup 115 Grape juice  cup 170 Grapefruit  fruit 175 Grapes  cup 155 Greens: kale, turnip, collard  cup 110-150 Ice cream or frozen yogurt, chocolate  cup 175 Ice cream or frozen yogurt, vanilla  cup 120-150 Lemons, limes 1 each 80 Lettuce, all types 1 cup 100 Mixed vegetables  cup 150 Mushrooms, raw  cup 110 Nuts: walnuts, pecans, macadamia 1 oz 125 Oatmeal  cup 80 Okra  cup 110 Onions, raw  cup 120 Peach 1 each 185 Peaches, canned   cup 120 Pears, canned  cup 120 Peas, green, frozen  cup 90 Peppers, green  cup 130 Peppers, red  cup 160 Pineapple juice  cup 165 Pineapple, fresh or canned  cup 100 Plums 1 each 105 Pudding, vanilla  cup 150 Raspberries  cup 90 Rhubarb  cup 115 Rice, wild  cup 80 Shrimp 3 oz 155 Spinach, raw 1 cup 170 Strawberries  cup 125 Summer squash  cup 175-200 Swiss chard, raw 1 cup 135 Tangerines 1 each 140 Tea, brewed 6 oz 65 Turnips  cup 140 Watermelon  cup 85 Wine, red,  table 5 oz 180 Wine, white, table 5 oz 100  Lower Potassium (less than 50 mg) Food Serving Potassium (mg) Bread, white 1 slice 30 Carbonated beverages 12 oz < 5 Cheese 1 oz 20-30 Cranberries  cup 45 Cranberry juice cocktail  cup 20 Fats and oils 1 Tbsp < 5 Hummus 1 Tbsp 32 Nectar: papaya, mango, or pear  cup 35 Rice, white or brown  cup 50 Spaghetti/macaroni, cooked  cup 30 Tortilla, flour or corn 1 each 50 Waffle, 4? 1 each 50 Water chestnuts  cup 40

## 2019-05-14 NOTE — TOC Progression Note (Signed)
Transition of Care [facility]) - Progression Note    Patient Details  Name: Stephanie Olson MRN: [id number] Date of Birth: 1940/11/27  Transition of Care [facility]) CM/SW Contact  Keyan Folson, [place], [place] Phone Number: 05/14/2019, 11:35 AM  Clinical Narrative:     Patient to be discharged back to [facility] term care today. Discharge orders and SNF transfer report provide to intake coordinator. [facility] contacted to provide transport for patient. Patient will be going to room 1204P. RN to call in report to [phone].  [place], [place] [facility] [phone]       Expected Discharge Plan and Services           Expected Discharge Date: 05/14/19                                     Social Determinants of Health (SDOH) Interventions    Readmission Risk Interventions No flowsheet data found.

## 2019-05-14 NOTE — Progress Notes (Signed)
NUTRITION NOTE RD working remotely.  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 Full assessment completed by this RD on 9/3 at which time Boost Breeze and Prostat were both ordered BID; both supplements are low in K. Patient has accepted these supplements 100% of the time offered.   She is currently on Renal/Carb Mod diet and flow sheet indicates 50% completion of lunch yesterday and 25% of breakfast this AM. Recommend continuing Boost Breeze (or equivalent, such as Ensure Clear) and prostat outpatient.   Will place handouts from the Academy of Nutrition and Dietetics in Discharge Instructions: High Potassium Foods list, Lower Potassium Foods list, Potassium Content of Foods list.      Jarome Matin, MS, RD, LDN, CNSC Inpatient Clinical Dietitian Pager # [phone] After hours/weekend pager # [phone]

## 2019-05-14 NOTE — Plan of Care (Signed)

## 2019-05-26 ENCOUNTER — Encounter (HOSPITAL_COMMUNITY): Payer: Self-pay

## 2019-05-26 ENCOUNTER — Inpatient Hospital Stay (HOSPITAL_COMMUNITY)
Admission: EM | Admit: 2019-05-26 | Discharge: 2019-05-30 | DRG: 641 | Disposition: A | Discharge status: Skilled Nursing Facility | Payer: Medicare Other | Attending: Internal Medicine | Admitting: Internal Medicine

## 2019-05-26 ENCOUNTER — Other Ambulatory Visit: Payer: Self-pay

## 2019-05-26 ENCOUNTER — Emergency Department (HOSPITAL_COMMUNITY): Payer: Medicare Other

## 2019-05-26 DIAGNOSIS — E875 Hyperkalemia: Secondary | ICD-10-CM | POA: Diagnosis not present

## 2019-05-26 DIAGNOSIS — Z6823 Body mass index (BMI) 23.0-23.9, adult: Secondary | ICD-10-CM

## 2019-05-26 DIAGNOSIS — E2740 Unspecified adrenocortical insufficiency: Secondary | ICD-10-CM | POA: Diagnosis present

## 2019-05-26 DIAGNOSIS — N179 Acute kidney failure, unspecified: Secondary | ICD-10-CM | POA: Diagnosis present

## 2019-05-26 DIAGNOSIS — E1140 Type 2 diabetes mellitus with diabetic neuropathy, unspecified: Secondary | ICD-10-CM | POA: Diagnosis present

## 2019-05-26 DIAGNOSIS — N390 Urinary tract infection, site not specified: Secondary | ICD-10-CM | POA: Diagnosis present

## 2019-05-26 DIAGNOSIS — R945 Abnormal results of liver function studies: Secondary | ICD-10-CM | POA: Diagnosis present

## 2019-05-26 DIAGNOSIS — F418 Other specified anxiety disorders: Secondary | ICD-10-CM | POA: Diagnosis present

## 2019-05-26 DIAGNOSIS — Z7982 Long term (current) use of aspirin: Secondary | ICD-10-CM

## 2019-05-26 DIAGNOSIS — E785 Hyperlipidemia, unspecified: Secondary | ICD-10-CM | POA: Diagnosis present

## 2019-05-26 DIAGNOSIS — I129 Hypertensive chronic kidney disease with stage 1 through stage 4 chronic kidney disease, or unspecified chronic kidney disease: Secondary | ICD-10-CM | POA: Diagnosis present

## 2019-05-26 DIAGNOSIS — Z23 Encounter for immunization: Secondary | ICD-10-CM

## 2019-05-26 DIAGNOSIS — E1122 Type 2 diabetes mellitus with diabetic chronic kidney disease: Secondary | ICD-10-CM | POA: Diagnosis present

## 2019-05-26 DIAGNOSIS — R627 Adult failure to thrive: Secondary | ICD-10-CM | POA: Diagnosis present

## 2019-05-26 DIAGNOSIS — N184 Chronic kidney disease, stage 4 (severe): Secondary | ICD-10-CM | POA: Diagnosis present

## 2019-05-26 DIAGNOSIS — D638 Anemia in other chronic diseases classified elsewhere: Secondary | ICD-10-CM | POA: Diagnosis present

## 2019-05-26 DIAGNOSIS — I10 Essential (primary) hypertension: Secondary | ICD-10-CM | POA: Diagnosis present

## 2019-05-26 DIAGNOSIS — N189 Chronic kidney disease, unspecified: Secondary | ICD-10-CM

## 2019-05-26 DIAGNOSIS — N3000 Acute cystitis without hematuria: Secondary | ICD-10-CM | POA: Diagnosis present

## 2019-05-26 DIAGNOSIS — Z8249 Family history of ischemic heart disease and other diseases of the circulatory system: Secondary | ICD-10-CM

## 2019-05-26 DIAGNOSIS — K828 Other specified diseases of gallbladder: Secondary | ICD-10-CM | POA: Diagnosis present

## 2019-05-26 DIAGNOSIS — E1151 Type 2 diabetes mellitus with diabetic peripheral angiopathy without gangrene: Secondary | ICD-10-CM | POA: Diagnosis present

## 2019-05-26 DIAGNOSIS — D649 Anemia, unspecified: Secondary | ICD-10-CM | POA: Diagnosis present

## 2019-05-26 DIAGNOSIS — F17210 Nicotine dependence, cigarettes, uncomplicated: Secondary | ICD-10-CM | POA: Diagnosis present

## 2019-05-26 DIAGNOSIS — Z79899 Other long term (current) drug therapy: Secondary | ICD-10-CM

## 2019-05-26 DIAGNOSIS — Z20828 Contact with and (suspected) exposure to other viral communicable diseases: Secondary | ICD-10-CM | POA: Diagnosis present

## 2019-05-26 LAB — COMPREHENSIVE METABOLIC PANEL
ALT: 49 U/L — ABNORMAL HIGH (ref 0–44)
AST: 25 U/L (ref 15–41)
Albumin: 3.6 g/dL (ref 3.5–5.0)
Alkaline Phosphatase: 135 U/L — ABNORMAL HIGH (ref 38–126)
Anion gap: 8 (ref 5–15)
BUN: 53 mg/dL — ABNORMAL HIGH (ref 8–23)
CO2: 17 mmol/L — ABNORMAL LOW (ref 22–32)
Calcium: 8.5 mg/dL — ABNORMAL LOW (ref 8.9–10.3)
Chloride: 113 mmol/L — ABNORMAL HIGH (ref 98–111)
Creatinine, Ser: 2.31 mg/dL — ABNORMAL HIGH (ref 0.44–1.00)
GFR calc Af Amer: 23 mL/min — ABNORMAL LOW (ref >60)
GFR calc non Af Amer: 20 mL/min — ABNORMAL LOW (ref >60)
Glucose, Bld: 111 mg/dL — ABNORMAL HIGH (ref 70–99)
Potassium: 6.8 mmol/L (ref 3.5–5.1)
Sodium: 138 mmol/L (ref 135–145)
Total Bilirubin: <0.1 mg/dL — ABNORMAL LOW (ref 0.3–1.2)
Total Protein: 7.3 g/dL (ref 6.5–8.1)

## 2019-05-26 MED ORDER — SODIUM ZIRCONIUM CYCLOSILICATE 10 G PO PACK
10.0000 g | PACK | Freq: Once | ORAL | Status: AC
  Administered 2019-05-27: 10 g via ORAL
  Filled 2019-05-26: qty 1

## 2019-05-26 NOTE — ED Notes (Signed)
Attempted to call patient's son, did not answer.

## 2019-05-26 NOTE — ED Notes (Signed)
Attempted to call patient's son again, son answered and requested for RN to call him back once patient is seen by MD.

## 2019-05-26 NOTE — ED Triage Notes (Signed)
Pt arrived by EMS from [facility] with high potassium. Last potassium was taken this morning at 11:18 and it was 7.3. Pt c/o no pain or symptoms. Pt has urinary catheter present on admission. Last vitals were 130/82, 100% RA, CBG 239, 100.3 and RR 18.

## 2019-05-26 NOTE — ED Provider Notes (Signed)
  Face-to-face evaluation   History: Sent here by her nursing care facility for evaluation of hyperkalemia.  Is a recurrent problem.  She currently has a Foley catheter, which was placed about 3 weeks ago, and reportedly is draining clear yellow urine.  Patient denies abdominal pain.  She denies vomiting.  Physical exam: Elderly patient was alert cooperative and responsive.  She is a somewhat poor historian.  No respiratory distress.  Abdomen soft nontender.  There is no palpable suprapubic mass consistent with bladder swelling.  Medical decision making-hyperkalemia with worsening renal function.  Unclear if this is structural versus metabolic.  Treatment begun with potassium binder, she will need ongoing management and observation in the hospital setting.   Medical screening examination/treatment/procedure(s) were conducted as a shared visit with non-physician practitioner(s) and myself.  I personally evaluated the patient during the encounter   Daleen Bo, MD 05/27/19 1036

## 2019-05-26 NOTE — ED Provider Notes (Addendum)
[place] DEPT Provider Note   CSN: [id number] Arrival date & time: 05/26/19  1742     History   Chief Complaint Chief Complaint  Patient presents with  . High potassium    HPI Stephanie Olson is a 78 y.o. female. Patient presents from SNF for elevated potassium today. She is otherwise feeling well. Patient states she is trying to follow low K diet but that nursing facility keeps bringing her bananas.  Patient is unsure whether she has been taking any medication to control her potassium. Patient has foley catheter still in place from last hospitalization (placed approx 3 weeks ago).  Patient denies any complications with her catheter.  States she is peeing.  Patient was hospitalized twice recently for hyperkalemia and followed up with nephrology last week and told to follow up next year.   Denies urinary changes, normal bowel movements, no LH, dizziness, CP, SOB, no leg swelling.       HPI  Past Medical History:  Diagnosis Date  . Arthritis   . Diabetes mellitus without complication ([place])   . Diabetic foot ulcers (HCC)    RIGHT   . Hypertension   . Peripheral vascular disease [facility])     Patient Active Problem List   Diagnosis Date Noted  . Diet-controlled diabetes mellitus ([place])   . Adrenal insufficiency ([place])   . Metabolic acidemia   . Acute lower UTI 04/22/2019  . Metabolic acidosis 16/09/930  . Malnutrition of moderate degree 04/25/2018  . Pressure injury of skin 04/23/2018  . Acute renal failure ([place])   . Acute urinary retention   . AKI (acute kidney injury) ([place])   . Renal failure (ARF), acute on chronic (HCC) 04/22/2018  . Essential hypertension 04/22/2018  . Normocytic anemia 04/22/2018  . Metabolic acidosis, normal anion gap (NAG) 04/22/2018  . Hyperkalemia 04/22/2018  . Aspiration into airway 04/22/2018  . Generalized weakness 04/22/2018  . Lethargy 04/22/2018  . Poor appetite 04/22/2018  . FTT (failure to thrive) in  adult 04/22/2018  . Diabetic neuropathy ([place]) 11/21/2015  . 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/2015  . Diabetic ulcer of foot, limited to breakdown of skin ([place]) 11/21/2015  . Foot pain, left 02/14/2015  . PVD (peripheral vascular disease) ([place]) 01/29/2015    Past Surgical History:  Procedure Laterality Date  . ABDOMINAL AORTAGRAM  01/29/2015  . ATHERECTOMY Right 01/29/2015   FEMORAL ARTERY   . BALLOON ANGIOPLASTY, ARTERY Right 01/29/2015   RT FEMORAL   . FOOT SURGERY    . PERIPHERAL VASCULAR CATHETERIZATION N/A 01/29/2015   Procedure: Abdominal Aortogram w/Lower Extremity;  Surgeon: Serafina Mitchell, MD;  Location: [facility];  Service: Cardiovascular;  Laterality: N/A;     OB History   No obstetric history on file.      Home Medications    Prior to Admission medications   Medication Sig Start Date End Date Taking? Authorizing Provider  Amino Acids-Protein Hydrolys (FEEDING SUPPLEMENT, PRO-STAT SUGAR FREE 64,) LIQD Take 30 mLs by mouth 2 (two) times daily. 05/14/19  Yes Florencia Reasons, MD  aspirin 81 MG chewable tablet Chew 81 mg by mouth daily.   Yes [provider]  bisacodyl (DULCOLAX) 10 MG suppository Place 1 suppository (10 mg total) rectally daily as needed for moderate constipation. 04/26/18  Yes Georgette Shell, MD  dicyclomine (BENTYL) 10 MG capsule Take 10 mg by mouth 2 (two) times daily. 03/17/19  Yes [provider]  DULoxetine (CYMBALTA) 20 MG capsule Take 20 mg  by mouth daily. 04/16/19  Yes [provider]  folic acid (FOLVITE) 1 MG tablet Take 1 tablet (1 mg total) by mouth daily. 05/15/19  Yes Florencia Reasons, MD  hydrocortisone (CORTEF) 5 MG tablet Take 1 tablet (5 mg total) by mouth daily. 05/15/19  Yes Florencia Reasons, MD  Melatonin 3 MG TABS Take 3 mg by mouth at bedtime.    Yes [provider]  mirtazapine (REMERON) 7.5 MG tablet Take 7.5 mg by mouth at bedtime.   Yes [provider]  ondansetron (ZOFRAN) 4 MG tablet Take 1 tablet (4 mg  total) by mouth every 6 (six) hours as needed for nausea. 04/26/18  Yes Georgette Shell, MD  polyvinyl alcohol (LIQUIFILM TEARS) 1.4 % ophthalmic solution Place 1 drop into both eyes 2 (two) times daily.   Yes [provider]  pravastatin (PRAVACHOL) 20 MG tablet Take 10 mg by mouth at bedtime. 04/05/19  Yes [provider]  senna-docusate (SENOKOT-S) 8.6-50 MG tablet Take 1 tablet by mouth 2 (two) times daily. 05/14/19  Yes Florencia Reasons, MD  sodium bicarbonate 650 MG tablet Take 2 tablets (1,300 mg total) by mouth 3 (three) times daily. 05/14/19  Yes Florencia Reasons, MD  sodium zirconium cyclosilicate [facility]) 5 g packet Take 5 g by mouth every Monday, Wednesday, and Friday. 05/15/19  Yes Florencia Reasons, MD  tamsulosin (FLOMAX) 0.4 MG CAPS capsule Take 0.4 mg by mouth daily. 04/06/19  Yes [provider]  trolamine salicylate (ASPERCREME) 10 % cream Apply 1 application topically daily. Right lateral ankle   Yes [provider]  vitamin B-12 1000 MCG tablet Take 1 tablet (1,000 mcg total) by mouth daily. 05/15/19  Yes Florencia Reasons, MD  acetaminophen (TYLENOL) 500 MG tablet Take 500 mg by mouth 2 (two) times daily as needed for mild pain.     [provider]  HYDROcodone-acetaminophen (NORCO/VICODIN) 5-325 MG tablet Take 1 tablet by mouth every 4 (four) hours as needed for moderate pain. 05/14/19   Florencia Reasons, MD  megestrol (MEGACE) 400 MG/10ML suspension Take 10 mLs (400 mg total) by mouth daily. 04/27/18   Roxan Hockey, MD    Family History Family History  Problem Relation Age of Onset  . Hypertension Mother   . Hypertension Father     Social History Social History   Tobacco Use  . Smoking status: Current Some Day Smoker    Types: Cigarettes    Last attempt to quit: 02/13/2014    Years since quitting: 5.2  . Smokeless tobacco: Never Used  . Tobacco comment: " I quit smoking along time ago "  Substance Use Topics  . Alcohol use: No    Alcohol/week: 0.0 standard drinks   . Drug use: No     Allergies   Patient has no known allergies.   Review of Systems Review of Systems  Constitutional: Negative for fever.  HENT: Negative for congestion.   Eyes: Negative for pain.  Respiratory: Negative for cough and shortness of breath.   Cardiovascular: Negative for chest pain.  Gastrointestinal: Negative for abdominal pain and vomiting.  Genitourinary: Negative for dysuria and frequency.  Musculoskeletal: Negative for myalgias.  Skin: Negative for rash.  Neurological: Negative for dizziness and headaches.     Physical Exam Updated Vital Signs BP 119/67   Pulse 84   Temp 99.7 F (37.6 C) (Oral)   Resp 13   Ht _0  (1.753 m)   Wt 70.8 kg   SpO2 100%   BMI  23.04 kg/m   Physical Exam Vitals signs and nursing note reviewed.  Constitutional:      General: She is not in acute distress.    Comments: Thin appearing  HENT:     Head: Normocephalic and atraumatic.     Nose: Nose normal.     Mouth/Throat:     Mouth: Mucous membranes are moist.  Eyes:     General: No scleral icterus. Neck:     Musculoskeletal: Normal range of motion.  Cardiovascular:     Rate and Rhythm: Normal rate and regular rhythm.     Pulses: Normal pulses.     Heart sounds: Normal heart sounds.  Pulmonary:     Effort: Pulmonary effort is normal. No respiratory distress.     Breath sounds: No wheezing.  Abdominal:     Palpations: Abdomen is soft.     Tenderness: There is no abdominal tenderness.  Musculoskeletal:     Right lower leg: No edema.     Left lower leg: No edema.     Comments: Patient has extremities patient states his were changed today and they have today's date on them.  There is no discharge evident from area.  Tenderness to palpation of bandaged area.  Skin:    General: Skin is warm and dry.     Capillary Refill: Capillary refill takes less than 2 seconds.  Neurological:     Mental Status: She is alert. Mental status is at baseline.  Psychiatric:         Mood and Affect: Mood normal.        Behavior: Behavior normal.      ED Treatments / Results  Labs (all labs ordered are listed, but only abnormal results are displayed) Labs Reviewed  COMPREHENSIVE METABOLIC PANEL - Abnormal; Notable for the following components:      Result Value   Potassium 6.8 (*)    Chloride 113 (*)    CO2 17 (*)    Glucose, Bld 111 (*)    BUN 53 (*)    Creatinine, Ser 2.31 (*)    Calcium 8.5 (*)    ALT 49 (*)    Alkaline Phosphatase 135 (*)    Total Bilirubin <0.1 (*)    GFR calc non Af Amer 20 (*)    GFR calc Af Amer 23 (*)    All other components within normal limits  CBC - Abnormal; Notable for the following components:   RBC 3.39 (*)    Hemoglobin 10.2 (*)    HCT 33.9 (*)    RDW 16.5 (*)    All other components within normal limits  URINALYSIS, ROUTINE W REFLEX MICROSCOPIC - Abnormal; Notable for the following components:   APPearance CLOUDY (*)    Hgb urine dipstick SMALL (*)    Protein, ur 100 (*)    Leukocytes,Ua LARGE (*)    WBC, UA >50 (*)    Bacteria, UA FEW (*)    All other components within normal limits  SARS CORONAVIRUS 2 (TAT 6-24 HRS)  URINE CULTURE    EKG EKG Interpretation  Date/Time:  Friday May 26 2019 22:11:57 EDT Ventricular Rate:  86 PR Interval:    QRS Duration: 99 QT Interval:  355 QTC Calculation: 425 R Axis:   59 Text Interpretation:  Sinus rhythm Abnormal T, consider ischemia, lateral leads since last tracing no significant change Confirmed by Daleen Bo [phone]) on 05/26/2019 10:38:55 PM   Radiology US Renal  Result Date: 05/26/2019 CLINICAL DATA:  Renal  insufficiency EXAM: RENAL / URINARY TRACT ULTRASOUND COMPLETE COMPARISON:  05/11/2019 FINDINGS: Right Kidney: Renal measurements: 8.5 x 4.5 x 4.2 cm = volume: 83 mL . Echogenicity within normal limits. No mass or hydronephrosis visualized. Left Kidney: Renal measurements: 8 x 3.8 x 4.6 cm = volume: 82 mL. Echogenicity within normal limits. No mass  or hydronephrosis visualized. Bladder: The bladder was decompressed with a Foley catheter. IMPRESSION: 1. Examination limited by patient body habitus. 2. No hydronephrosis. 3. Bladder decompressed with a Foley catheter. Electronically Signed   By: Constance Holster M.D.   On: 05/26/2019 23:45    Procedures Procedures (including critical care time)  Medications Ordered in ED Medications  cefTRIAXone (ROCEPHIN) 1 g in sodium chloride 0.9 % 100 mL IVPB (has no administration in time range)  calcium gluconate 1 g/ 50 mL sodium chloride IVPB (has no administration in time range)  sodium bicarbonate injection 50 mEq (has no administration in time range)  sodium zirconium cyclosilicate (LOKELMA) packet 10 g (10 g Oral Given 05/27/19 0008)     Initial Impression / Assessment and Plan / ED Course  I have reviewed the triage vital signs and the nursing notes.  Pertinent labs & imaging results that were available during my care of the patient were reviewed by me and considered in my medical decision making (see chart for details).  Clinical Course as of May 26 624  Sat May 27, 2019  0328 Discussed with Dr. Maudie Mercury who will admit   [HM]  0623 Potassium(!!): 6.8 [WF]    Clinical Course User Index [HM] Muthersbaugh, Gwenlyn Perking [WF] Tedd Sias, [place]      Patient is asymptomatic 79 year old female with CKD presenting to ED from nursing facility due to hyperkalemia at [facility].  2 hospitalizations within the last month for hyperkalemia.    On chart review, past 2 hospitalizations have been 8/14 and 9/2 most recently currently with Foley in place.  Patient discharged with low potassium diet, Lokelma, sodium bicarb supplement, follow-up with nephrology.  It is unclear if patient is taking her Lokelma at this time or her low potassium diet.   Patient is scheduled for outpatient endocrinology follow-up due to concern for adrenal insufficiency during last hospital stay (1.2, 9.7 thirty minutes after stim  and 13.6 after 1 hour).  Patient has stable blood pressure and ED today.   Patient UA consistent with UTI with large leukocytes, over 50 WBC, protein and hemoglobin urine we will treat with IV Rocephin.  Renal ultrasound without evidence of hydronephrosis or post renal obstruction. K+ is 6.8 and BUN and creatinine are acutely elevated at 53 and 2.31 respectively.  ALT and alk phos mildly elevated today Within unclear if this is due to underlying liver disease or transient elevation in setting of AKI.  Patient requires inpatient management of hyperkalemia. Muthersbaugh PA-C discussed with Dr. Maudie Mercury.    Final Clinical Impressions(s) / ED Diagnoses   Final diagnoses:  Acute renal failure superimposed on chronic kidney disease, unspecified CKD stage, unspecified acute renal failure type [facility])  Acute cystitis without hematuria  Hyperkalemia    ED Discharge Orders    None       Tedd Sias, [place] 05/27/19 0332    Tedd Sias, PA 05/27/19 9244    Daleen Bo, MD 05/27/19 1037

## 2019-05-26 NOTE — ED Notes (Signed)
Date and time results received: 05/26/19  (use smartphrase ".now" to insert current time)  Test: K+ Critical Value: 6.8  Name of Provider Notified:   Orders Received? Or Actions Taken?: Pt on cardiac monitor, waiting for blood work

## 2019-05-26 NOTE — ED Notes (Signed)
Pt has medical information (labs, medications, etc) at bedside.

## 2019-05-27 ENCOUNTER — Other Ambulatory Visit: Payer: Self-pay

## 2019-05-27 ENCOUNTER — Encounter (HOSPITAL_COMMUNITY): Payer: Self-pay | Admitting: Internal Medicine

## 2019-05-27 DIAGNOSIS — N179 Acute kidney failure, unspecified: Secondary | ICD-10-CM | POA: Diagnosis present

## 2019-05-27 DIAGNOSIS — E2740 Unspecified adrenocortical insufficiency: Secondary | ICD-10-CM | POA: Diagnosis not present

## 2019-05-27 DIAGNOSIS — F418 Other specified anxiety disorders: Secondary | ICD-10-CM | POA: Diagnosis present

## 2019-05-27 DIAGNOSIS — D638 Anemia in other chronic diseases classified elsewhere: Secondary | ICD-10-CM | POA: Diagnosis present

## 2019-05-27 DIAGNOSIS — N390 Urinary tract infection, site not specified: Secondary | ICD-10-CM | POA: Diagnosis not present

## 2019-05-27 DIAGNOSIS — N3000 Acute cystitis without hematuria: Secondary | ICD-10-CM | POA: Diagnosis present

## 2019-05-27 DIAGNOSIS — Z8249 Family history of ischemic heart disease and other diseases of the circulatory system: Secondary | ICD-10-CM | POA: Diagnosis not present

## 2019-05-27 DIAGNOSIS — K828 Other specified diseases of gallbladder: Secondary | ICD-10-CM | POA: Diagnosis present

## 2019-05-27 DIAGNOSIS — E1122 Type 2 diabetes mellitus with diabetic chronic kidney disease: Secondary | ICD-10-CM | POA: Diagnosis present

## 2019-05-27 DIAGNOSIS — Z79899 Other long term (current) drug therapy: Secondary | ICD-10-CM | POA: Diagnosis not present

## 2019-05-27 DIAGNOSIS — Z20828 Contact with and (suspected) exposure to other viral communicable diseases: Secondary | ICD-10-CM | POA: Diagnosis present

## 2019-05-27 DIAGNOSIS — R945 Abnormal results of liver function studies: Secondary | ICD-10-CM

## 2019-05-27 DIAGNOSIS — E875 Hyperkalemia: Principal | ICD-10-CM

## 2019-05-27 DIAGNOSIS — R627 Adult failure to thrive: Secondary | ICD-10-CM | POA: Diagnosis present

## 2019-05-27 DIAGNOSIS — Z7982 Long term (current) use of aspirin: Secondary | ICD-10-CM | POA: Diagnosis not present

## 2019-05-27 DIAGNOSIS — F17210 Nicotine dependence, cigarettes, uncomplicated: Secondary | ICD-10-CM | POA: Diagnosis present

## 2019-05-27 DIAGNOSIS — E785 Hyperlipidemia, unspecified: Secondary | ICD-10-CM | POA: Diagnosis present

## 2019-05-27 DIAGNOSIS — I129 Hypertensive chronic kidney disease with stage 1 through stage 4 chronic kidney disease, or unspecified chronic kidney disease: Secondary | ICD-10-CM | POA: Diagnosis present

## 2019-05-27 DIAGNOSIS — E1140 Type 2 diabetes mellitus with diabetic neuropathy, unspecified: Secondary | ICD-10-CM | POA: Diagnosis present

## 2019-05-27 DIAGNOSIS — E1151 Type 2 diabetes mellitus with diabetic peripheral angiopathy without gangrene: Secondary | ICD-10-CM | POA: Diagnosis present

## 2019-05-27 DIAGNOSIS — I10 Essential (primary) hypertension: Secondary | ICD-10-CM

## 2019-05-27 DIAGNOSIS — Z23 Encounter for immunization: Secondary | ICD-10-CM | POA: Diagnosis present

## 2019-05-27 DIAGNOSIS — N184 Chronic kidney disease, stage 4 (severe): Secondary | ICD-10-CM | POA: Diagnosis present

## 2019-05-27 DIAGNOSIS — Z6823 Body mass index (BMI) 23.0-23.9, adult: Secondary | ICD-10-CM | POA: Diagnosis not present

## 2019-05-27 LAB — CBC
HCT: 33.9 % — ABNORMAL LOW (ref 36.0–46.0)
Hemoglobin: 10.2 g/dL — ABNORMAL LOW (ref 12.0–15.0)
MCH: 30.1 pg (ref 26.0–34.0)
MCHC: 30.1 g/dL (ref 30.0–36.0)
MCV: 100 fL (ref 80.0–100.0)
Platelets: 236 10*3/uL (ref 150–400)
RBC: 3.39 MIL/uL — ABNORMAL LOW (ref 3.87–5.11)
RDW: 16.5 % — ABNORMAL HIGH (ref 11.5–15.5)
WBC: 8.6 10*3/uL (ref 4.0–10.5)
nRBC: 0 % (ref 0.0–0.2)

## 2019-05-27 LAB — URINALYSIS, ROUTINE W REFLEX MICROSCOPIC
Bilirubin Urine: NEGATIVE
Glucose, UA: NEGATIVE mg/dL
Ketones, ur: NEGATIVE mg/dL
Nitrite: NEGATIVE
Protein, ur: 100 mg/dL — AB
Specific Gravity, Urine: 1.015 (ref 1.005–1.030)
WBC, UA: >50 WBC/hpf — ABNORMAL HIGH (ref 0–5)
pH: 6 (ref 5.0–8.0)

## 2019-05-27 LAB — GLUCOSE, CAPILLARY
Glucose-Capillary: 154 mg/dL — ABNORMAL HIGH (ref 70–99)
Glucose-Capillary: 126 mg/dL — ABNORMAL HIGH (ref 70–99)

## 2019-05-27 LAB — BASIC METABOLIC PANEL
Anion gap: 3 — ABNORMAL LOW (ref 5–15)
Anion gap: 6 (ref 5–15)
BUN: 50 mg/dL — ABNORMAL HIGH (ref 8–23)
BUN: 46 mg/dL — ABNORMAL HIGH (ref 8–23)
CO2: 21 mmol/L — ABNORMAL LOW (ref 22–32)
CO2: 20 mmol/L — ABNORMAL LOW (ref 22–32)
Calcium: 8.7 mg/dL — ABNORMAL LOW (ref 8.9–10.3)
Calcium: 8.1 mg/dL — ABNORMAL LOW (ref 8.9–10.3)
Chloride: 115 mmol/L — ABNORMAL HIGH (ref 98–111)
Chloride: 113 mmol/L — ABNORMAL HIGH (ref 98–111)
Creatinine, Ser: 2.07 mg/dL — ABNORMAL HIGH (ref 0.44–1.00)
Creatinine, Ser: 2.04 mg/dL — ABNORMAL HIGH (ref 0.44–1.00)
GFR calc Af Amer: 26 mL/min — ABNORMAL LOW (ref >60)
GFR calc Af Amer: 27 mL/min — ABNORMAL LOW (ref >60)
GFR calc non Af Amer: 23 mL/min — ABNORMAL LOW (ref >60)
GFR calc non Af Amer: 23 mL/min — ABNORMAL LOW (ref >60)
Glucose, Bld: 109 mg/dL — ABNORMAL HIGH (ref 70–99)
Glucose, Bld: 148 mg/dL — ABNORMAL HIGH (ref 70–99)
Potassium: 6.2 mmol/L — ABNORMAL HIGH (ref 3.5–5.1)
Potassium: 6.2 mmol/L — ABNORMAL HIGH (ref 3.5–5.1)
Sodium: 139 mmol/L (ref 135–145)
Sodium: 139 mmol/L (ref 135–145)

## 2019-05-27 LAB — CORTISOL: Cortisol, Plasma: 12.9 ug/dL

## 2019-05-27 LAB — SARS CORONAVIRUS 2 (TAT 6-24 HRS): SARS Coronavirus 2: NEGATIVE

## 2019-05-27 MED ORDER — ASPIRIN 81 MG PO CHEW
81.0000 mg | CHEWABLE_TABLET | Freq: Every day | ORAL | Status: DC
  Administered 2019-05-27 – 2019-05-30 (×4): 81 mg via ORAL
  Filled 2019-05-27 (×4): qty 1

## 2019-05-27 MED ORDER — ACETAMINOPHEN 325 MG PO TABS
650.0000 mg | ORAL_TABLET | Freq: Four times a day (QID) | ORAL | Status: DC | PRN
  Administered 2019-05-29: 650 mg via ORAL
  Filled 2019-05-27: qty 2

## 2019-05-27 MED ORDER — VITAMIN B-12 1000 MCG PO TABS
1000.0000 ug | ORAL_TABLET | Freq: Every day | ORAL | Status: DC
  Administered 2019-05-27 – 2019-05-30 (×4): 1000 ug via ORAL
  Filled 2019-05-27 (×4): qty 1

## 2019-05-27 MED ORDER — ONDANSETRON HCL 4 MG PO TABS: 4.0000 mg | ORAL_TABLET | Freq: Four times a day (QID) | ORAL | Status: DC | PRN

## 2019-05-27 MED ORDER — PRO-STAT SUGAR FREE PO LIQD
30.0000 mL | Freq: Two times a day (BID) | ORAL | Status: DC
  Administered 2019-05-27 – 2019-05-30 (×6): 30 mL via ORAL
  Filled 2019-05-27 (×7): qty 30

## 2019-05-27 MED ORDER — ENOXAPARIN SODIUM 30 MG/0.3ML ~~LOC~~ SOLN
30.0000 mg | SUBCUTANEOUS | Status: DC
  Administered 2019-05-27 – 2019-05-30 (×4): 30 mg via SUBCUTANEOUS
  Filled 2019-05-27 (×5): qty 0.3

## 2019-05-27 MED ORDER — TROLAMINE SALICYLATE 10 % EX CREA: 1.0000 "application " | TOPICAL_CREAM | Freq: Every day | CUTANEOUS | Status: DC

## 2019-05-27 MED ORDER — SODIUM CHLORIDE 0.9 % IV SOLN
1.0000 g | Freq: Once | INTRAVENOUS | Status: AC
  Administered 2019-05-27: 06:00:00 1 g via INTRAVENOUS
  Filled 2019-05-27: qty 1

## 2019-05-27 MED ORDER — MELATONIN 3 MG PO TABS
3.0000 mg | ORAL_TABLET | Freq: Every day | ORAL | Status: DC
  Administered 2019-05-27 – 2019-05-29 (×3): 3 mg via ORAL
  Filled 2019-05-27 (×4): qty 1

## 2019-05-27 MED ORDER — SODIUM CHLORIDE 0.9 % IV SOLN
INTRAVENOUS | Status: AC
  Administered 2019-05-27: 07:00:00 via INTRAVENOUS

## 2019-05-27 MED ORDER — MIRTAZAPINE 15 MG PO TABS
7.5000 mg | ORAL_TABLET | Freq: Every day | ORAL | Status: DC
  Administered 2019-05-27 – 2019-05-29 (×3): 7.5 mg via ORAL
  Filled 2019-05-27 (×3): qty 1

## 2019-05-27 MED ORDER — CALCIUM GLUCONATE-NACL 1-0.675 GM/50ML-% IV SOLN
1.0000 g | Freq: Once | INTRAVENOUS | Status: AC
  Administered 2019-05-27: 1000 mg via INTRAVENOUS
  Filled 2019-05-27: qty 50

## 2019-05-27 MED ORDER — SODIUM BICARBONATE 650 MG PO TABS
1300.0000 mg | ORAL_TABLET | Freq: Three times a day (TID) | ORAL | Status: DC
  Administered 2019-05-27 – 2019-05-30 (×10): 1300 mg via ORAL
  Filled 2019-05-27 (×10): qty 2

## 2019-05-27 MED ORDER — ACETAMINOPHEN 650 MG RE SUPP: 650.0000 mg | Freq: Four times a day (QID) | RECTAL | Status: DC | PRN

## 2019-05-27 MED ORDER — TAMSULOSIN HCL 0.4 MG PO CAPS
0.4000 mg | ORAL_CAPSULE | Freq: Every day | ORAL | Status: DC
  Administered 2019-05-27 – 2019-05-30 (×4): 0.4 mg via ORAL
  Filled 2019-05-27 (×4): qty 1

## 2019-05-27 MED ORDER — SODIUM ZIRCONIUM CYCLOSILICATE 10 G PO PACK
10.0000 g | PACK | Freq: Once | ORAL | Status: AC
  Administered 2019-05-27: 10 g via ORAL
  Filled 2019-05-27: qty 1

## 2019-05-27 MED ORDER — FOLIC ACID 1 MG PO TABS
1.0000 mg | ORAL_TABLET | Freq: Every day | ORAL | Status: DC
  Administered 2019-05-27 – 2019-05-30 (×4): 1 mg via ORAL
  Filled 2019-05-27 (×4): qty 1

## 2019-05-27 MED ORDER — MUSCLE RUB 10-15 % EX CREA
TOPICAL_CREAM | Freq: Every day | CUTANEOUS | Status: DC
  Administered 2019-05-27 – 2019-05-30 (×4): via TOPICAL
  Filled 2019-05-27: qty 85

## 2019-05-27 MED ORDER — SODIUM CHLORIDE 0.9 % IV SOLN
1.0000 g | INTRAVENOUS | Status: DC
  Administered 2019-05-28 – 2019-05-29 (×2): 1 g via INTRAVENOUS
  Filled 2019-05-27 (×2): qty 1

## 2019-05-27 MED ORDER — INSULIN ASPART 100 UNIT/ML ~~LOC~~ SOLN
0.0000 [IU] | Freq: Three times a day (TID) | SUBCUTANEOUS | Status: DC
  Administered 2019-05-27: 1 [IU] via SUBCUTANEOUS
  Administered 2019-05-27: 2 [IU] via SUBCUTANEOUS
  Administered 2019-05-28: 1 [IU] via SUBCUTANEOUS
  Administered 2019-05-28: 2 [IU] via SUBCUTANEOUS
  Administered 2019-05-29 – 2019-05-30 (×2): 1 [IU] via SUBCUTANEOUS

## 2019-05-27 MED ORDER — PRAVASTATIN SODIUM 20 MG PO TABS
10.0000 mg | ORAL_TABLET | Freq: Every day | ORAL | Status: DC
  Administered 2019-05-27 – 2019-05-29 (×3): 10 mg via ORAL
  Filled 2019-05-27 (×3): qty 1

## 2019-05-27 MED ORDER — BISACODYL 10 MG RE SUPP
10.0000 mg | Freq: Every day | RECTAL | Status: DC | PRN
  Administered 2019-05-27: 17:00:00 10 mg via RECTAL
  Filled 2019-05-27: qty 1

## 2019-05-27 MED ORDER — SODIUM ZIRCONIUM CYCLOSILICATE 5 G PO PACK
5.0000 g | PACK | ORAL | Status: DC
  Administered 2019-05-29: 5 g via ORAL
  Filled 2019-05-27: qty 1

## 2019-05-27 MED ORDER — SODIUM BICARBONATE 8.4 % IV SOLN
50.0000 meq | Freq: Once | INTRAVENOUS | Status: AC
  Administered 2019-05-27: 06:00:00 50 meq via INTRAVENOUS
  Filled 2019-05-27 (×2): qty 50

## 2019-05-27 MED ORDER — DULOXETINE HCL 20 MG PO CPEP
20.0000 mg | ORAL_CAPSULE | Freq: Every day | ORAL | Status: DC
  Administered 2019-05-27 – 2019-05-30 (×4): 20 mg via ORAL
  Filled 2019-05-27 (×4): qty 1

## 2019-05-27 MED ORDER — SENNOSIDES-DOCUSATE SODIUM 8.6-50 MG PO TABS
1.0000 | ORAL_TABLET | Freq: Two times a day (BID) | ORAL | Status: DC
  Administered 2019-05-27 – 2019-05-30 (×7): 1 via ORAL
  Filled 2019-05-27 (×7): qty 1

## 2019-05-27 MED ORDER — POLYVINYL ALCOHOL 1.4 % OP SOLN
1.0000 [drp] | Freq: Two times a day (BID) | OPHTHALMIC | Status: DC
  Administered 2019-05-27 – 2019-05-30 (×7): 1 [drp] via OPHTHALMIC
  Filled 2019-05-27: qty 15

## 2019-05-27 MED ORDER — HYDROCODONE-ACETAMINOPHEN 5-325 MG PO TABS
1.0000 | ORAL_TABLET | ORAL | Status: DC | PRN
  Administered 2019-05-28 – 2019-05-30 (×2): 1 via ORAL
  Filled 2019-05-27 (×2): qty 1

## 2019-05-27 MED ORDER — DICYCLOMINE HCL 10 MG PO CAPS
10.0000 mg | ORAL_CAPSULE | Freq: Two times a day (BID) | ORAL | Status: DC
  Administered 2019-05-27 – 2019-05-30 (×7): 10 mg via ORAL
  Filled 2019-05-27 (×7): qty 1

## 2019-05-27 MED ORDER — INSULIN ASPART 100 UNIT/ML ~~LOC~~ SOLN: 0.0000 [IU] | Freq: Every day | SUBCUTANEOUS | Status: DC

## 2019-05-27 MED ORDER — HYDROCORTISONE 5 MG PO TABS
5.0000 mg | ORAL_TABLET | Freq: Every day | ORAL | Status: DC
  Administered 2019-05-27 – 2019-05-28 (×2): 5 mg via ORAL
  Filled 2019-05-27 (×3): qty 1

## 2019-05-27 NOTE — H&P (Addendum)
TRH H&P    Patient Demographics:    Stephanie Olson, is a 78 y.o. female  MRN: [id number]  DOB - [birth year]  Admit Date - 05/26/2019  Referring MD/NP/PA:   Eulis Foster  Outpatient Primary MD for the patient is Benito Mccreedy, MD  Patient coming from:   home  Chief complaint- hyperkalemia   HPI:    Stephanie Olson  is a 78 y.o. female,  w 78 y.o.femalewith history ofCKD-4, DM-2, HTN, anemia of chronic disease, diabetic neuropathy,PVD/angioplasty and arthrectomy in 2016 and urinary retention presenting from facility with hyperkalemia. Pt was recently hospitalized and discharge on 05/14/2019 for similar complaint,  Pt is currently on lokemla on MWF as well as sodium bicarbonate.   In ED,  T 99.7  P 104, R 13, Bp 125/75  Pox 100% on RA Wt 70.8kg  Renal ultrasound IMPRESSION: 1. Examination limited by patient body habitus. 2. No hydronephrosis. 3. Bladder decompressed with a Foley catheter.  Na 138, K 6.8,  Bun 53, Creatinine 2.31  Wbc 8.6, Hgb 10.2, Plt 236  Urinalysis wbc >50, rbc 0-5   Pt will be admitted for acute on chronic renal failure and hyperkalemia, and uti    Review of systems:    In addition to the HPI above,  No Fever-chills, No Headache, No changes with Vision or hearing, No problems swallowing food or Liquids, No Chest pain, Cough or Shortness of Breath, No Abdominal pain, No Nausea or Vomiting, bowel movements are regular, No Blood in stool or Urine, No dysuria, No new skin rashes or bruises, No new joints pains-aches,  No new weakness, tingling, numbness in any extremity, No recent weight gain or loss, No polyuria, polydypsia or polyphagia, No significant Mental Stressors.  All other systems reviewed and are negative.    Past History of the following :    Past Medical History:  Diagnosis Date   Arthritis    Diabetes mellitus without complication ([place])     Diabetic foot ulcers ([place])    RIGHT    Hypertension    Peripheral vascular disease [facility])       Past Surgical History:  Procedure Laterality Date   ABDOMINAL AORTAGRAM  01/29/2015   ATHERECTOMY Right 01/29/2015   FEMORAL ARTERY    BALLOON ANGIOPLASTY, ARTERY Right 01/29/2015   RT FEMORAL    FOOT SURGERY     PERIPHERAL VASCULAR CATHETERIZATION N/A 01/29/2015   Procedure: Abdominal Aortogram w/Lower Extremity;  Surgeon: Serafina Mitchell, MD;  Location: [facility];  Service: Cardiovascular;  Laterality: N/A;      Social History:      Social History   Tobacco Use   Smoking status: Current Some Day Smoker    Types: Cigarettes    Last attempt to quit: 02/13/2014    Years since quitting: 5.2   Smokeless tobacco: Never Used   Tobacco comment: " I quit smoking along time ago "  Substance Use Topics   Alcohol use: No    Alcohol/week: 0.0 standard drinks       Family History :  Family History  Problem Relation Age of Onset   Hypertension Mother    Hypertension Father        Home Medications:   Prior to Admission medications   Medication Sig Start Date End Date Taking? Authorizing Provider  Amino Acids-Protein Hydrolys (FEEDING SUPPLEMENT, PRO-STAT SUGAR FREE 64,) LIQD Take 30 mLs by mouth 2 (two) times daily. 05/14/19  Yes Florencia Reasons, MD  aspirin 81 MG chewable tablet Chew 81 mg by mouth daily.   Yes [provider]  bisacodyl (DULCOLAX) 10 MG suppository Place 1 suppository (10 mg total) rectally daily as needed for moderate constipation. 04/26/18  Yes Georgette Shell, MD  dicyclomine (BENTYL) 10 MG capsule Take 10 mg by mouth 2 (two) times daily. 03/17/19  Yes [provider]  DULoxetine (CYMBALTA) 20 MG capsule Take 20 mg by mouth daily. 04/16/19  Yes [provider]  folic acid (FOLVITE) 1 MG tablet Take 1 tablet (1 mg total) by mouth daily. 05/15/19  Yes Florencia Reasons, MD  hydrocortisone (CORTEF) 5 MG tablet Take 1 tablet (5 mg  total) by mouth daily. 05/15/19  Yes Florencia Reasons, MD  Melatonin 3 MG TABS Take 3 mg by mouth at bedtime.    Yes [provider]  mirtazapine (REMERON) 7.5 MG tablet Take 7.5 mg by mouth at bedtime.   Yes [provider]  ondansetron (ZOFRAN) 4 MG tablet Take 1 tablet (4 mg total) by mouth every 6 (six) hours as needed for nausea. 04/26/18  Yes Georgette Shell, MD  polyvinyl alcohol (LIQUIFILM TEARS) 1.4 % ophthalmic solution Place 1 drop into both eyes 2 (two) times daily.   Yes [provider]  pravastatin (PRAVACHOL) 20 MG tablet Take 10 mg by mouth at bedtime. 04/05/19  Yes [provider]  senna-docusate (SENOKOT-S) 8.6-50 MG tablet Take 1 tablet by mouth 2 (two) times daily. 05/14/19  Yes Florencia Reasons, MD  sodium bicarbonate 650 MG tablet Take 2 tablets (1,300 mg total) by mouth 3 (three) times daily. 05/14/19  Yes Florencia Reasons, MD  sodium zirconium cyclosilicate [facility]) 5 g packet Take 5 g by mouth every Monday, Wednesday, and Friday. 05/15/19  Yes Florencia Reasons, MD  tamsulosin (FLOMAX) 0.4 MG CAPS capsule Take 0.4 mg by mouth daily. 04/06/19  Yes [provider]  trolamine salicylate (ASPERCREME) 10 % cream Apply 1 application topically daily. Right lateral ankle   Yes [provider]  vitamin B-12 1000 MCG tablet Take 1 tablet (1,000 mcg total) by mouth daily. 05/15/19  Yes Florencia Reasons, MD  acetaminophen (TYLENOL) 500 MG tablet Take 500 mg by mouth 2 (two) times daily as needed for mild pain.     [provider]  HYDROcodone-acetaminophen (NORCO/VICODIN) 5-325 MG tablet Take 1 tablet by mouth every 4 (four) hours as needed for moderate pain. 05/14/19   Florencia Reasons, MD  megestrol (MEGACE) 400 MG/10ML suspension Take 10 mLs (400 mg total) by mouth daily. 04/27/18   Roxan Hockey, MD     Allergies:    No Known Allergies   Physical Exam:   Vitals  Blood pressure 120/62, pulse 84, temperature 99.7 F (37.6 C), temperature source Oral, resp. rate 14,  height 5\' 9"  (1.753 m), weight 70.8 kg, SpO2 100 %.  1.  General: axoxo3  2. Psychiatric: euthymic  3. Neurologic: cn2-12 intact, reflexes 2+ symmetric, diffuse with no clonus, motor 5/5 in all 4 ext  4. HEENMT:  Anicteric, pupils 1.108mm symmetric, direct, consensual, near intact  5. Respiratory :  CTAB  6. Cardiovascular : rrr s1, s2,   7. Gastrointestinal:  Abd: soft, nt, nd, +bs  8. Skin:  Ext: no c/c/e,   9.Musculoskeletal:  Good ROM    Data Review:    CBC Recent Labs  Lab 05/26/19 2330  WBC 8.6  HGB 10.2*  HCT 33.9*  PLT 236  MCV 100.0  MCH 30.1  MCHC 30.1  RDW 16.5*   ------------------------------------------------------------------------------------------------------------------  Results for orders placed or performed during the hospital encounter of 05/26/19 (from the past 48 hour(s))  Comprehensive metabolic panel     Status: Abnormal   Collection Time: 05/26/19  8:42 PM  Result Value Ref Range   Sodium 138 135 - 145 mmol/L   Potassium 6.8 (HH) 3.5 - 5.1 mmol/L    Comment: NO VISIBLE HEMOLYSIS CRITICAL RESULT CALLED TO, READ BACK BY AND VERIFIED WITH: RAU,C RN @2123  ON 05/26/2019 JACKSON,K    Chloride 113 (H) 98 - 111 mmol/L   CO2 17 (L) 22 - 32 mmol/L   Glucose, Bld 111 (H) 70 - 99 mg/dL   BUN 53 (H) 8 - 23 mg/dL   Creatinine, Ser 2.31 (H) 0.44 - 1.00 mg/dL   Calcium 8.5 (L) 8.9 - 10.3 mg/dL   Total Protein 7.3 6.5 - 8.1 g/dL   Albumin 3.6 3.5 - 5.0 g/dL   AST 25 15 - 41 U/L   ALT 49 (H) 0 - 44 U/L   Alkaline Phosphatase 135 (H) 38 - 126 U/L   Total Bilirubin <0.1 (L) 0.3 - 1.2 mg/dL   GFR calc non Af Amer 20 (L) >60 mL/min   GFR calc Af Amer 23 (L) >60 mL/min   Anion gap 8 5 - 15    Comment: Performed at [facility], [place] [address], [place], [place] [address]  CBC     Status: Abnormal   Collection Time: 05/26/19 11:30 PM  Result Value Ref Range   WBC 8.6 4.0 - 10.5 K/uL   RBC 3.39 (L) 3.87 - 5.11 MIL/uL    Hemoglobin 10.2 (L) 12.0 - 15.0 g/dL   HCT 33.9 (L) 36.0 - 46.0 %   MCV 100.0 80.0 - 100.0 fL   MCH 30.1 26.0 - 34.0 pg   MCHC 30.1 30.0 - 36.0 g/dL   RDW 16.5 (H) 11.5 - 15.5 %   Platelets 236 150 - 400 K/uL   nRBC 0.0 0.0 - 0.2 %    Comment: Performed at [facility], [place] [address], [place], [place] [address]  Urinalysis, Routine w reflex microscopic     Status: Abnormal   Collection Time: 05/27/19  2:10 AM  Result Value Ref Range   Color, Urine YELLOW YELLOW   APPearance CLOUDY (A) CLEAR   Specific Gravity, Urine 1.015 1.005 - 1.030   pH 6.0 5.0 - 8.0   Glucose, UA NEGATIVE NEGATIVE mg/dL   Hgb urine dipstick SMALL (A) NEGATIVE   Bilirubin Urine NEGATIVE NEGATIVE   Ketones, ur NEGATIVE NEGATIVE mg/dL   Protein, ur 100 (A) NEGATIVE mg/dL   Nitrite NEGATIVE NEGATIVE   Leukocytes,Ua LARGE (A) NEGATIVE   RBC / HPF 0-5 0 - 5 RBC/hpf   WBC, UA >50 (H) 0 - 5 WBC/hpf   Bacteria, UA FEW (A) NONE SEEN   Squamous Epithelial / LPF 0-5 0 - 5   WBC Clumps PRESENT    Amorphous Crystal PRESENT     Comment: Performed at [facility], [place] [address] [place],  [address]  Chemistries  Recent Labs  Lab 05/26/19 2042  NA 138  K 6.8*  CL 113*  CO2 17*  GLUCOSE 111*  BUN 53*  CREATININE 2.31*  CALCIUM 8.5*  AST 25  ALT 49*  ALKPHOS 135*  BILITOT <0.1*   ------------------------------------------------------------------------------------------------------------------  ------------------------------------------------------------------------------------------------------------------ GFR: Estimated Creatinine Clearance: 21.7 mL/min (A) (by C-G formula based on SCr of 2.31 mg/dL (H)). Liver Function Tests: Recent Labs  Lab 05/26/19 2042  AST 25  ALT 49*  ALKPHOS 135*  BILITOT <0.1*  PROT 7.3  ALBUMIN 3.6   No results for input(s): LIPASE, AMYLASE in the last 168 hours. No results for input(s): AMMONIA in the last 168  hours. Coagulation Profile: No results for input(s): INR, PROTIME in the last 168 hours. Cardiac Enzymes: No results for input(s): CKTOTAL, CKMB, CKMBINDEX, TROPONINI in the last 168 hours. BNP (last 3 results) No results for input(s): PROBNP in the last 8760 hours. HbA1C: No results for input(s): HGBA1C in the last 72 hours. CBG: No results for input(s): GLUCAP in the last 168 hours. Lipid Profile: No results for input(s): CHOL, HDL, LDLCALC, TRIG, CHOLHDL, LDLDIRECT in the last 72 hours. Thyroid Function Tests: No results for input(s): TSH, T4TOTAL, FREET4, T3FREE, THYROIDAB in the last 72 hours. Anemia Panel: No results for input(s): VITAMINB12, FOLATE, FERRITIN, TIBC, IRON, RETICCTPCT in the last 72 hours.  --------------------------------------------------------------------------------------------------------------- Urine analysis:    Component Value Date/Time   COLORURINE YELLOW 05/27/2019 0210   APPEARANCEUR CLOUDY (A) 05/27/2019 0210   LABSPEC 1.015 05/27/2019 0210   PHURINE 6.0 05/27/2019 0210   GLUCOSEU NEGATIVE 05/27/2019 0210   HGBUR SMALL (A) 05/27/2019 0210   BILIRUBINUR NEGATIVE 05/27/2019 0210   KETONESUR NEGATIVE 05/27/2019 0210   PROTEINUR 100 (A) 05/27/2019 0210   UROBILINOGEN 0.2 03/03/2010 0808   NITRITE NEGATIVE 05/27/2019 0210   LEUKOCYTESUR LARGE (A) 05/27/2019 0210      Imaging Results:    US Renal  Result Date: 05/26/2019 CLINICAL DATA:  Renal insufficiency EXAM: RENAL / URINARY TRACT ULTRASOUND COMPLETE COMPARISON:  05/11/2019 FINDINGS: Right Kidney: Renal measurements: 8.5 x 4.5 x 4.2 cm = volume: 83 mL . Echogenicity within normal limits. No mass or hydronephrosis visualized. Left Kidney: Renal measurements: 8 x 3.8 x 4.6 cm = volume: 82 mL. Echogenicity within normal limits. No mass or hydronephrosis visualized. Bladder: The bladder was decompressed with a Foley catheter. IMPRESSION: 1. Examination limited by patient body habitus. 2. No  hydronephrosis. 3. Bladder decompressed with a Foley catheter. Electronically Signed   By: Constance Holster M.D.   On: 05/26/2019 23:45   nsr at 85 nl axis, nl int, no peak T or QRS widening   Assessment & Plan:    Principal Problem:   Hyperkalemia Active Problems:   Essential hypertension   Anemia   ARF (acute renal failure) (HCC)   Adrenal insufficiency (HCC)   Abnormal liver function  Hyperkalemia Calcium gluconate 1gm iv x1 Sodium bicarb 1 amp iv x1 Kayexalate 30gm po x1 Check bmp at 9am  ARF on CKD stage 3 Ns iv  Check cmp in am  Acute Lower Uti Urine culture Rocephin 1gm iv qday  Hyperlipidemia, PVD Cont Pravastatin 10mg  po qhs (note this can affect liver function, if lft worsens consider stopping) Cont aspirin 81mg  po qday  Dm2 (diet controlled0 fsbs ac and qhs, ISS  Abnormal liver function Check acute hepatitis panel Prior CT scan abd/ pelvis 04/23/2019 Hepatobiliary: Layering stones within the moderately distended gallbladder. No focal liver abnormality. No bile duct dilatation  seen.  H/o adrenal insufficiency Check cortisol level this am  Cont Cortef  Anxiety/ Depression Cont Cymbalta Cont Remeron (note this can affect liver function, if lft worsens consider stopping)   DVT Prophylaxis-   Lovenox - SCDs   AM Labs Ordered, also please review Full Orders  Family Communication: Admission, patients condition and plan of care including tests being ordered have been discussed with the patient  who indicate understanding and agree with the plan and Code Status.  Code Status:  FULL CODE per patient  Admission status: Observation: Based on patients clinical presentation and evaluation of above clinical data, I have made determination that patient meets observationcriteria at this time.  Time spent in minutes : 70 minutes   Jani Gravel M.D on 05/27/2019 at 3:30 AM

## 2019-05-27 NOTE — ED Notes (Signed)
ED TO INPATIENT HANDOFF REPORT  Name/Age/Gender Stephanie Olson 78 y.o. female  Code Status Code Status History    Date Active Date Inactive Code Status Order ID Comments User Context   05/10/2019 1707 05/14/2019 1744 Full Code [id number]  Mercy Riding, MD Inpatient   04/21/2019 2147 04/26/2019 1748 Full Code [id number]  Toy Baker, MD ED   04/22/2018 1812 04/28/2018 0310 Full Code [id number]  Kerney Elbe, DO ED   01/29/2015 2016 01/30/2015 2121 Full Code [id number]  Serafina Mitchell, MD Inpatient   01/29/2015 1733 01/29/2015 2016 Full Code [id number]  Angelia Mould, MD Inpatient   Advance Care Planning Activity    Advance Directive Documentation     Most Recent Value  Type of Advance Directive  Healthcare Power of [place]  Pre-existing out of facility DNR order (yellow form or pink MOST form)  -  "MOST" Form in Place?  -      Home/SNF/Other Skilled nursing facility  Chief Complaint High Potassium  Level of Care/Admitting Diagnosis ED Disposition    ED Disposition Condition [place]: [place] [[id number]]  Level of Care: Telemetry [5]  Admit to tele based on following criteria: Monitor for Ischemic changes  Covid Evaluation: Asymptomatic Screening Protocol (No Symptoms)  Diagnosis: Hyperkalemia [id number]  Admitting Physician: Jani Gravel [[id number]]  Attending Physician: Jani Gravel [[id number]]  PT Class (Do Not Modify): Observation [104]  PT Acc Code (Do Not Modify): Observation [10022]       Medical History Past Medical History:  Diagnosis Date  . Arthritis   . Diabetes mellitus without complication ([place])   . Diabetic foot ulcers (HCC)    RIGHT   . Hypertension   . Peripheral vascular disease ([place])     Allergies No Known Allergies  IV Location/Drains/Wounds Patient Lines/Drains/Airways Status   Active Line/Drains/Airways    Name:   Placement date:   Placement time:   Site:   Days:   Peripheral IV 05/26/19 Right  Forearm   05/26/19    2041    Forearm   1   Urethral Catheter Leigh Garvin Fila  Non-latex 14 Fr.   05/11/19    1317    Non-latex   16   Pressure Injury 04/22/18 Full thickness non-pressure would with skin graft healing   04/22/18    1833     400   Pressure Injury 05/10/19 Foot Anterior;Right Deep Tissue Injury - Purple or maroon localized area of discolored intact skin or blood-filled blister due to damage of underlying soft tissue from pressure and/or shear. purple, discolored round area on base    05/10/19    1700     17          Labs/Imaging Results for orders placed or performed during the hospital encounter of 05/26/19 (from the past 48 hour(s))  Comprehensive metabolic panel     Status: Abnormal   Collection Time: 05/26/19  8:42 PM  Result Value Ref Range   Sodium 138 135 - 145 mmol/L   Potassium 6.8 (HH) 3.5 - 5.1 mmol/L    Comment: NO VISIBLE HEMOLYSIS CRITICAL RESULT CALLED TO, READ BACK BY AND VERIFIED WITH: RAU,C RN @2123  ON 05/26/2019 JACKSON,K    Chloride 113 (H) 98 - 111 mmol/L   CO2 17 (L) 22 - 32 mmol/L   Glucose, Bld 111 (H) 70 - 99 mg/dL   BUN 53 (H) 8 - 23 mg/dL   Creatinine, Ser 2.31 (H) 0.44 -  1.00 mg/dL   Calcium 8.5 (L) 8.9 - 10.3 mg/dL   Total Protein 7.3 6.5 - 8.1 g/dL   Albumin 3.6 3.5 - 5.0 g/dL   AST 25 15 - 41 U/L   ALT 49 (H) 0 - 44 U/L   Alkaline Phosphatase 135 (H) 38 - 126 U/L   Total Bilirubin <0.1 (L) 0.3 - 1.2 mg/dL   GFR calc non Af Amer 20 (L) >60 mL/min   GFR calc Af Amer 23 (L) >60 mL/min   Anion gap 8 5 - 15    Comment: Performed at [facility] [place] [address] [place], [place] [address]  CBC     Status: Abnormal   Collection Time: 05/26/19 11:30 PM  Result Value Ref Range   WBC 8.6 4.0 - 10.5 K/uL   RBC 3.39 (L) 3.87 - 5.11 MIL/uL   Hemoglobin 10.2 (L) 12.0 - 15.0 g/dL   HCT 33.9 (L) 36.0 - 46.0 %   MCV 100.0 80.0 - 100.0 fL   MCH 30.1 26.0 - 34.0 pg   MCHC 30.1 30.0 - 36.0 g/dL   RDW 16.5 (H) 11.5 - 15.5 %    Platelets 236 150 - 400 K/uL   nRBC 0.0 0.0 - 0.2 %    Comment: Performed at [facility], [place] [address] [place], [place] [address]  Urinalysis, Routine w reflex microscopic     Status: Abnormal   Collection Time: 05/27/19  2:10 AM  Result Value Ref Range   Color, Urine YELLOW YELLOW   APPearance CLOUDY (A) CLEAR   Specific Gravity, Urine 1.015 1.005 - 1.030   pH 6.0 5.0 - 8.0   Glucose, UA NEGATIVE NEGATIVE mg/dL   Hgb urine dipstick SMALL (A) NEGATIVE   Bilirubin Urine NEGATIVE NEGATIVE   Ketones, ur NEGATIVE NEGATIVE mg/dL   Protein, ur 100 (A) NEGATIVE mg/dL   Nitrite NEGATIVE NEGATIVE   Leukocytes,Ua LARGE (A) NEGATIVE   RBC / HPF 0-5 0 - 5 RBC/hpf   WBC, UA >50 (H) 0 - 5 WBC/hpf   Bacteria, UA FEW (A) NONE SEEN   Squamous Epithelial / LPF 0-5 0 - 5   WBC Clumps PRESENT    Amorphous Crystal PRESENT     Comment: Performed at [facility], [place] [address], [place], [place] [address]   US Renal  Result Date: 05/26/2019 CLINICAL DATA:  Renal insufficiency EXAM: RENAL / URINARY TRACT ULTRASOUND COMPLETE COMPARISON:  05/11/2019 FINDINGS: Right Kidney: Renal measurements: 8.5 x 4.5 x 4.2 cm = volume: 83 mL . Echogenicity within normal limits. No mass or hydronephrosis visualized. Left Kidney: Renal measurements: 8 x 3.8 x 4.6 cm = volume: 82 mL. Echogenicity within normal limits. No mass or hydronephrosis visualized. Bladder: The bladder was decompressed with a Foley catheter. IMPRESSION: 1. Examination limited by patient body habitus. 2. No hydronephrosis. 3. Bladder decompressed with a Foley catheter. Electronically Signed   By: Constance Holster M.D.   On: 05/26/2019 23:45    Pending Labs Unresulted Labs (From admission, onward)    Start     Ordered   05/27/19 0311  Urine culture  ONCE - STAT,   STAT     05/27/19 0311   05/26/19 2313  SARS CORONAVIRUS 2 (TAT 6-24 HRS) Nasopharyngeal Nasopharyngeal Swab  (Asymptomatic/Tier 2 Patients Labs)   Once,   STAT    Question Answer Comment  Is this test for diagnosis or screening Screening   Symptomatic for COVID-19 as defined by CDC  No   Hospitalized for COVID-19 No   Admitted to ICU for COVID-19 No   Previously tested for COVID-19 Yes   Resident in a congregate (group) care setting Yes   Employed in healthcare setting No   Pregnant No      05/26/19 2312          Vitals/Pain Today's Vitals   05/26/19 2330 05/27/19 0000 05/27/19 0100 05/27/19 0130  BP: 139/73 126/81 129/69 120/62  Pulse: 85 85 87 84  Resp: 13 12 12 14   Temp:      TempSrc:      SpO2: 100% 100% 100% 100%  Weight:      Height:      PainSc:        Isolation Precautions No active isolations  Medications Medications  cefTRIAXone (ROCEPHIN) 1 g in sodium chloride 0.9 % 100 mL IVPB (has no administration in time range)  calcium gluconate 1 g/ 50 mL sodium chloride IVPB (has no administration in time range)  sodium bicarbonate injection 50 mEq (has no administration in time range)  sodium zirconium cyclosilicate (LOKELMA) packet 10 g (10 g Oral Given 05/27/19 0008)    Mobility walks

## 2019-05-27 NOTE — Progress Notes (Addendum)
PROGRESS NOTE    Stephanie Olson  [id number] DOB: 09/18/1940 DOA: 05/26/2019 PCP: Benito Mccreedy, MD   Brief Narrative:  Stephanie Olson  is a 79 y.o. female,  w 78 y.o.femalewith history ofCKD-4, DM-2, HTN, anemia of chronic disease, diabetic neuropathy,PVD/angioplasty and arthrectomy in 2016 and urinary retention presenting from facility with hyperkalemia. Pt was recently hospitalized and discharge on 05/14/2019 for similar complaint,  Pt is currently on lokemla on MWF as well as sodium bicarbonate.   In ED,  T 99.7  P 104, R 13, Bp 125/75  Pox 100% on RA Wt 70.8kg Renal ultrasound IMPRESSION: 1. Examination limited by patient body habitus. 2. No hydronephrosis. 3. Bladder decompressed with a Foley catheter. Na 138, K 6.8,  Bun 53, Creatinine 2.31 Wbc 8.6, Hgb 10.2, Plt 236 Urinalysis wbc >50, rbc 0-5  Pt will be admitted for acute on chronic renal failure and hyperkalemia, and uti   Assessment & Plan:   Principal Problem:   Hyperkalemia Active Problems:   Essential hypertension   Anemia   ARF (acute renal failure) (HCC)   Acute lower UTI   Adrenal insufficiency (HCC)   Abnormal liver function   Hyperkalemia Calcium gluconate 1gm iv x1 Sodium bicarb 1 amp iv x1 Kayexalate 30gm po x1 Repeat is improving although still elevated Lokelma x1 Extend fluids Recheck BMP this evening-notify hospitalist  ARF on CKD stage 3 Ns iv  Check cmp in am  Acute Lower Uti Urine culture pending Rocephin 1gm iv qday  Hyperlipidemia, PVD Cont Pravastatin 10mg  po qhs (note this can affect liver function, if lft worsens consider stopping) Cont aspirin 81mg  po qday  Dm2 (diet controlled0 fsbs ac and qhs, ISS  Abnormal liver function Check acute hepatitis panel Prior CT scan abd/ pelvis 04/23/2019 Hepatobiliary: Layering stones within the moderately distended gallbladder. No focal liver abnormality. No bile duct dilatation seen.  H/o adrenal  insufficiency Check cortisol level this am  Cont Cortef  Anxiety/ Depression Cont Cymbalta Cont Remeron (note this can affect liver function, if lft worsens consider stopping)  DVT prophylaxis: Lovenox SQ  Code Status: Full    Code Status Orders  (From admission, onward)         Start     Ordered   05/27/19 0727  Full code  Continuous     05/27/19 0728        Code Status History    Date Active Date Inactive Code Status Order ID Comments User Context   05/10/2019 1707 05/14/2019 1744 Full Code [id number]  Mercy Riding, MD Inpatient   04/21/2019 2147 04/26/2019 1748 Full Code [id number]  Toy Baker, MD ED   04/22/2018 1812 04/28/2018 0310 Full Code [id number]  Kerney Elbe, [place] [facility]   01/29/2015 2016 01/30/2015 2121 Full Code [id number]  Serafina Mitchell, MD Inpatient   01/29/2015 1733 01/29/2015 2016 Full Code [id number]  Angelia Mould, MD Inpatient   Advance Care Planning Activity    Advance Directive Documentation     Most Recent Value  Type of Advance Directive  Healthcare Power of Attorney  Pre-existing out of facility DNR order (yellow form or pink MOST form)  --  "MOST" Form in Place?  --     Family Communication: Discussed in detail with patient Disposition Plan:   Patient will remain inpatient for elevated potassium requiring lab monitoring, medical and medication intervention as well as treatment urinary tract infection that these treatments patient at risk of life-threatening clinical deterioration consults called: None Admission status:  Inpatient   Consultants:   None  Procedures:  US Renal  Result Date: 05/26/2019 CLINICAL DATA:  Renal insufficiency EXAM: RENAL / URINARY TRACT ULTRASOUND COMPLETE COMPARISON:  05/11/2019 FINDINGS: Right Kidney: Renal measurements: 8.5 x 4.5 x 4.2 cm = volume: 83 mL . Echogenicity within normal limits. No mass or hydronephrosis visualized. Left Kidney: Renal measurements: 8 x 3.8 x 4.6 cm = volume: 82 mL.  Echogenicity within normal limits. No mass or hydronephrosis visualized. Bladder: The bladder was decompressed with a Foley catheter. IMPRESSION: 1. Examination limited by patient body habitus. 2. No hydronephrosis. 3. Bladder decompressed with a Foley catheter. Electronically Signed   By: Constance Holster M.D.   On: 05/26/2019 23:45   US Renal  Result Date: 05/11/2019 CLINICAL DATA:  Acute renal injury EXAM: RENAL / URINARY TRACT ULTRASOUND COMPLETE COMPARISON:  04/23/2019 FINDINGS: Right Kidney: Renal measurements: 8.8 x 4.6 x 3.9 cm. = volume: 82 mL. No hydronephrosis is noted. A few small cysts are seen. The largest of these measures 1.3 cm. Left Kidney: Renal measurements: 9.8 x 5.5 x 4.0 cm. = volume: 114 mL. Echogenicity within normal limits. No mass or hydronephrosis visualized. Bladder: Decompressed by Foley catheter IMPRESSION: Right renal cyst better visualized than on prior CT examination. No hydronephrosis is noted. Electronically Signed   By: [name] M.D.   On: 05/11/2019 14:13     Antimicrobials:   rocephin   Subjective: No acute events overnight resting comfortably in bed  Objective: Vitals:   05/27/19 0330 05/27/19 0400 05/27/19 0440 05/27/19 1240  BP: 125/69 131/71 (!) 155/81 114/70  Pulse: 83 81 88 95  Resp: 13  14 17   Temp:   99.2 F (37.3 C) 98.6 F (37 C)  TempSrc:   Oral Oral  SpO2: 100% 100% 98% 100%  Weight:      Height:        Intake/Output Summary (Last 24 hours) at 05/27/2019 1558 Last data filed at 05/27/2019 0600 Gross per 24 hour  Intake 297.92 ml  Output --  Net 297.92 ml   Filed Weights   05/26/19 1759  Weight: 70.8 kg    Examination:  General exam: Appears calm and comfortable  Respiratory system: Clear to auscultation. Respiratory effort normal. Cardiovascular system: S1 & S2 heard, RRR. No JVD, murmurs, rubs, gallops or clicks. No pedal edema. Gastrointestinal system: Abdomen is nondistended, soft and nontender. No organomegaly  or masses felt. Normal bowel sounds heard. Central nervous system: Alert and oriented. No focal neurological deficits. Extremities: WWP, no edema. Skin: No rashes, lesions or ulcers Psychiatry: Judgement and insight appear normal. Mood & affect appropriate.     Data Reviewed: I have personally reviewed following labs and imaging studies  CBC: Recent Labs  Lab 05/26/19 2330  WBC 8.6  HGB 10.2*  HCT 33.9*  MCV 100.0  PLT [id number]   Basic Metabolic Panel: Recent Labs  Lab 05/26/19 2042 05/27/19 0932  NA 138 139  K 6.8* 6.2*  CL 113* 115*  CO2 17* 21*  GLUCOSE 111* 109*  BUN 53* 50*  CREATININE 2.31* 2.07*  CALCIUM 8.5* 8.7*   GFR: Estimated Creatinine Clearance: 24.2 mL/min (A) (by C-G formula based on SCr of 2.07 mg/dL (H)). Liver Function Tests: Recent Labs  Lab 05/26/19 2042  AST 25  ALT 49*  ALKPHOS 135*  BILITOT <0.1*  PROT 7.3  ALBUMIN 3.6   No results for input(s): LIPASE, AMYLASE in the last 168 hours. No results for input(s): AMMONIA in the last  168 hours. Coagulation Profile: No results for input(s): INR, PROTIME in the last 168 hours. Cardiac Enzymes: No results for input(s): CKTOTAL, CKMB, CKMBINDEX, TROPONINI in the last 168 hours. BNP (last 3 results) No results for input(s): PROBNP in the last 8760 hours. HbA1C: No results for input(s): HGBA1C in the last 72 hours. CBG: Recent Labs  Lab 05/27/19 1207  GLUCAP 154*   Lipid Profile: No results for input(s): CHOL, HDL, LDLCALC, TRIG, CHOLHDL, LDLDIRECT in the last 72 hours. Thyroid Function Tests: No results for input(s): TSH, T4TOTAL, FREET4, T3FREE, THYROIDAB in the last 72 hours. Anemia Panel: No results for input(s): VITAMINB12, FOLATE, FERRITIN, TIBC, IRON, RETICCTPCT in the last 72 hours. Sepsis Labs: No results for input(s): PROCALCITON, LATICACIDVEN in the last 168 hours.  No results found for this or any previous visit (from the past 240 hour(s)).       Radiology Studies: US  Renal  Result Date: 06/19/19 CLINICAL DATA:  Renal insufficiency EXAM: RENAL / URINARY TRACT ULTRASOUND COMPLETE COMPARISON:  05/11/2019 FINDINGS: Right Kidney: Renal measurements: 8.5 x 4.5 x 4.2 cm = volume: 83 mL . Echogenicity within normal limits. No mass or hydronephrosis visualized. Left Kidney: Renal measurements: 8 x 3.8 x 4.6 cm = volume: 82 mL. Echogenicity within normal limits. No mass or hydronephrosis visualized. Bladder: The bladder was decompressed with a Foley catheter. IMPRESSION: 1. Examination limited by patient body habitus. 2. No hydronephrosis. 3. Bladder decompressed with a Foley catheter. Electronically Signed   By: Constance Holster M.D.   On: Jun 19, 2019 23:45        Scheduled Meds:  aspirin  81 mg Oral Daily   dicyclomine  10 mg Oral BID   DULoxetine  20 mg Oral Daily   enoxaparin (LOVENOX) injection  30 mg Subcutaneous Q24H   feeding supplement (PRO-STAT SUGAR FREE 64)  30 mL Oral BID   folic acid  1 mg Oral Daily   hydrocortisone  5 mg Oral Daily   insulin aspart  0-5 Units Subcutaneous QHS   insulin aspart  0-9 Units Subcutaneous TID WC   Melatonin  3 mg Oral QHS   mirtazapine  7.5 mg Oral QHS   Muscle Rub   Topical Daily   polyvinyl alcohol  1 drop Both Eyes BID   pravastatin  10 mg Oral QHS   senna-docusate  1 tablet Oral BID   sodium bicarbonate  1,300 mg Oral TID   sodium zirconium cyclosilicate  10 g Oral Once   [START ON 05/29/2019] sodium zirconium cyclosilicate  5 g Oral Q M,W,F   tamsulosin  0.4 mg Oral Daily   cyanocobalamin  1,000 mcg Oral Daily   Continuous Infusions:  sodium chloride 75 mL/hr at 05/27/19 0700   [START ON 05/28/2019] cefTRIAXone (ROCEPHIN)  IV       LOS: 0 days    Time spent: 35 min    Nicolette Bang, MD [facility]  If 7PM-7AM, please contact night-coverage  05/27/2019, 3:58 PM

## 2019-05-28 LAB — COMPREHENSIVE METABOLIC PANEL
ALT: 37 U/L (ref 0–44)
AST: 18 U/L (ref 15–41)
Albumin: 3.2 g/dL — ABNORMAL LOW (ref 3.5–5.0)
Alkaline Phosphatase: 114 U/L (ref 38–126)
Anion gap: 6 (ref 5–15)
BUN: 45 mg/dL — ABNORMAL HIGH (ref 8–23)
CO2: 19 mmol/L — ABNORMAL LOW (ref 22–32)
Calcium: 8.1 mg/dL — ABNORMAL LOW (ref 8.9–10.3)
Chloride: 116 mmol/L — ABNORMAL HIGH (ref 98–111)
Creatinine, Ser: 2.04 mg/dL — ABNORMAL HIGH (ref 0.44–1.00)
GFR calc Af Amer: 27 mL/min — ABNORMAL LOW (ref >60)
GFR calc non Af Amer: 23 mL/min — ABNORMAL LOW (ref >60)
Glucose, Bld: 117 mg/dL — ABNORMAL HIGH (ref 70–99)
Potassium: 6 mmol/L — ABNORMAL HIGH (ref 3.5–5.1)
Sodium: 141 mmol/L (ref 135–145)
Total Bilirubin: 0.2 mg/dL — ABNORMAL LOW (ref 0.3–1.2)
Total Protein: 6.3 g/dL — ABNORMAL LOW (ref 6.5–8.1)

## 2019-05-28 LAB — HEPATITIS PANEL, ACUTE
HCV Ab: <0.1 s/co ratio (ref 0.0–0.9)
Hep A IgM: NEGATIVE
Hep B C IgM: NEGATIVE
Hepatitis B Surface Ag: NEGATIVE

## 2019-05-28 LAB — BASIC METABOLIC PANEL
Anion gap: 8 (ref 5–15)
BUN: 43 mg/dL — ABNORMAL HIGH (ref 8–23)
CO2: 19 mmol/L — ABNORMAL LOW (ref 22–32)
Calcium: 8.1 mg/dL — ABNORMAL LOW (ref 8.9–10.3)
Chloride: 113 mmol/L — ABNORMAL HIGH (ref 98–111)
Creatinine, Ser: 2.05 mg/dL — ABNORMAL HIGH (ref 0.44–1.00)
GFR calc Af Amer: 27 mL/min — ABNORMAL LOW (ref >60)
GFR calc non Af Amer: 23 mL/min — ABNORMAL LOW (ref >60)
Glucose, Bld: 106 mg/dL — ABNORMAL HIGH (ref 70–99)
Potassium: 6.1 mmol/L — ABNORMAL HIGH (ref 3.5–5.1)
Sodium: 140 mmol/L (ref 135–145)

## 2019-05-28 LAB — GLUCOSE, CAPILLARY
Glucose-Capillary: 140 mg/dL — ABNORMAL HIGH (ref 70–99)
Glucose-Capillary: 161 mg/dL — ABNORMAL HIGH (ref 70–99)
Glucose-Capillary: 100 mg/dL — ABNORMAL HIGH (ref 70–99)
Glucose-Capillary: 116 mg/dL — ABNORMAL HIGH (ref 70–99)

## 2019-05-28 LAB — CBC
HCT: 28.3 % — ABNORMAL LOW (ref 36.0–46.0)
Hemoglobin: 8.4 g/dL — ABNORMAL LOW (ref 12.0–15.0)
MCH: 29.9 pg (ref 26.0–34.0)
MCHC: 29.7 g/dL — ABNORMAL LOW (ref 30.0–36.0)
MCV: 100.7 fL — ABNORMAL HIGH (ref 80.0–100.0)
Platelets: 200 10*3/uL (ref 150–400)
RBC: 2.81 MIL/uL — ABNORMAL LOW (ref 3.87–5.11)
RDW: 16.3 % — ABNORMAL HIGH (ref 11.5–15.5)
WBC: 6.7 10*3/uL (ref 4.0–10.5)
nRBC: 0 % (ref 0.0–0.2)

## 2019-05-28 LAB — MRSA PCR SCREENING: MRSA by PCR: NEGATIVE

## 2019-05-28 MED ORDER — SODIUM POLYSTYRENE SULFONATE 15 GM/60ML PO SUSP
30.0000 g | Freq: Once | ORAL | Status: AC
  Administered 2019-05-28: 30 g via ORAL
  Filled 2019-05-28: qty 120

## 2019-05-28 NOTE — Progress Notes (Signed)
PROGRESS NOTE    Stephanie Olson  [id number] DOB: 08/08/1941 DOA: 05/26/2019 PCP: Stephanie Mccreedy, MD   Brief Narrative:  [name] [id number] y.o.female,w77 y.o.femalewith history ofCKD-4, DM-2, HTN, anemia of chronic disease, diabetic neuropathy,PVD/angioplasty and arthrectomy in 2016 and urinary retention presenting from facility with hyperkalemia.Pt was recently hospitalized and discharge on 05/14/2019 for similar complaint, Pt is currently on lokemla on MWF as well as sodium bicarbonate.   Assessment & Plan:   Principal Problem:   Hyperkalemia Active Problems:   Essential hypertension   Anemia   ARF (acute renal failure) (HCC)   Acute lower UTI   Adrenal insufficiency (HCC)   Abnormal liver function   Hyperkalemia Repeat 6 Sodium bicarb 1 amp iv x1 Kayexalate 30gm po x1 Repeat is improving although still elevated Extend fluids Recheck BMP this evening-notify hospitalist  ARF on CKD stage 3 Ns iv  Check cmp in am  Acute Lower Uti Urine culture reincubating Rocephin 1gm iv qday  Hyperlipidemia, PVD Cont Pravastatin 10mg  po qhs (note this can affect liver function, if lft worsens consider stopping) Cont aspirin 81mg  po qday  Dm2 (diet controlled0 fsbs ac and qhs, ISS  Abnormal liver function Neg acute hepatitis panel Prior CT scan abd/ pelvis 04/23/2019 Hepatobiliary: Layering stones within the moderately distended gallbladder. No focal liver abnormality. No bile duct dilatation seen.  H/o adrenal insufficiency 1.5  Cont Cortef  Anxiety/ Depression Cont Cymbalta Cont Remeron (note this can affect liver function, if lft worsens consider stopping)  DVT prophylaxis: Lovenox SQ  Code Status: Full    Code Status Orders  (From admission, onward)         Start     Ordered   05/27/19 0727  Full code  Continuous     05/27/19 0728        Code Status History    Date Active Date Inactive Code Status Order ID Comments User  Context   05/10/2019 1707 05/14/2019 1744 Full Code [id number]  Stephanie Riding, MD Inpatient   04/21/2019 2147 04/26/2019 1748 Full Code [id number]  Stephanie Baker, MD ED   04/22/2018 1812 04/28/2018 0310 Full Code [id number]  Stephanie Elbe, DO ED   01/29/2015 2016 01/30/2015 2121 Full Code [id number]  Stephanie Mitchell, MD Inpatient   01/29/2015 1733 01/29/2015 2016 Full Code [id number]  Stephanie Mould, MD Inpatient   Advance Care Planning Activity    Advance Directive Documentation     Most Recent Value  Type of Advance Directive  Healthcare Power of Attorney  Pre-existing out of facility DNR order (yellow form or pink MOST form)  -  "MOST" Form in Place?  -     Family Communication: spoke with son, answered all questions Disposition Plan:   Patient remained inpatient for an additional day, electrolyte monitoring, Consults called: None Admission status: Inpatient   Consultants:   None  Procedures:  US Renal  Result Date: 05/26/2019 CLINICAL DATA:  Renal insufficiency EXAM: RENAL / URINARY TRACT ULTRASOUND COMPLETE COMPARISON:  05/11/2019 FINDINGS: Right Kidney: Renal measurements: 8.5 x 4.5 x 4.2 cm = volume: 83 mL . Echogenicity within normal limits. No mass or hydronephrosis visualized. Left Kidney: Renal measurements: 8 x 3.8 x 4.6 cm = volume: 82 mL. Echogenicity within normal limits. No mass or hydronephrosis visualized. Bladder: The bladder was decompressed with a Foley catheter. IMPRESSION: 1. Examination limited by patient body habitus. 2. No hydronephrosis. 3. Bladder decompressed with a Foley catheter. Electronically Signed   By: Stephanie Olson.D.  On: 05/26/2019 23:45   US Renal  Result Date: 05/11/2019 CLINICAL DATA:  Acute renal injury EXAM: RENAL / URINARY TRACT ULTRASOUND COMPLETE COMPARISON:  04/23/2019 FINDINGS: Right Kidney: Renal measurements: 8.8 x 4.6 x 3.9 cm. = volume: 82 mL. No hydronephrosis is noted. A few small cysts are seen. The largest of these  measures 1.3 cm. Left Kidney: Renal measurements: 9.8 x 5.5 x 4.0 cm. = volume: 114 mL. Echogenicity within normal limits. No mass or hydronephrosis visualized. Bladder: Decompressed by Foley catheter IMPRESSION: Right renal cyst better visualized than on prior CT examination. No hydronephrosis is noted. Electronically Signed   By: Stephanie Olson M.D.   On: 05/11/2019 14:13     Antimicrobials:   none    Subjective: Resting in bed comfortably, No acute problems overnight  Objective: Vitals:   05/27/19 0440 05/27/19 1240 05/28/19 0654 05/28/19 1237  BP: (!) 155/81 114/70 135/60 115/74  Pulse: 88 95 70 84  Resp: 14 17 14  (!) 22  Temp: 99.2 F (37.3 C) 98.6 F (37 C) 97.7 F (36.5 C) 99.1 F (37.3 C)  TempSrc: Oral Oral Oral Oral  SpO2: 98% 100% 100% 99%  Weight:   70 kg   Height:        Intake/Output Summary (Last 24 hours) at 05/28/2019 1335 Last data filed at 05/28/2019 1200 Gross per 24 hour  Intake 1403.03 ml  Output 2625 ml  Net -1221.97 ml   Filed Weights   05/26/19 1759 05/28/19 0654  Weight: 70.8 kg 70 kg    Examination:  General exam: Appears calm and comfortable  Respiratory system: Clear to auscultation. Respiratory effort normal. Cardiovascular system: S1 & S2 heard, RRR. No JVD, murmurs, rubs, gallops or clicks. No pedal edema. Gastrointestinal system: Abdomen is nondistended, soft and nontender. No organomegaly or masses felt. Normal bowel sounds heard. Central nervous system: Alert and oriented. No focal neurological deficits. Extremities: wwp, no edema. Skin: No rashes, lesions or ulcers Psychiatry: Judgement and insight appear normal. Mood & affect appropriate.     Data Reviewed: I have personally reviewed following labs and imaging studies  CBC: Recent Labs  Lab 05/26/19 2330 05/28/19 0413  WBC 8.6 6.7  HGB 10.2* 8.4*  HCT 33.9* 28.3*  MCV 100.0 100.7*  PLT 236 [id number]   Basic Metabolic Panel: Recent Labs  Lab 05/26/19 2042 05/27/19 0932  05/27/19 2158 05/28/19 0413  NA 138 139 139 141  K 6.8* 6.2* 6.2* 6.0*  CL 113* 115* 113* 116*  CO2 17* 21* 20* 19*  GLUCOSE 111* 109* 148* 117*  BUN 53* 50* 46* 45*  CREATININE 2.31* 2.07* 2.04* 2.04*  CALCIUM 8.5* 8.7* 8.1* 8.1*   GFR: Estimated Creatinine Clearance: 24.5 mL/min (A) (by C-G formula based on SCr of 2.04 mg/dL (H)). Liver Function Tests: Recent Labs  Lab 05/26/19 2042 05/28/19 0413  AST 25 18  ALT 49* 37  ALKPHOS 135* 114  BILITOT <0.1* 0.2*  PROT 7.3 6.3*  ALBUMIN 3.6 3.2*   No results for input(s): LIPASE, AMYLASE in the last 168 hours. No results for input(s): AMMONIA in the last 168 hours. Coagulation Profile: No results for input(s): INR, PROTIME in the last 168 hours. Cardiac Enzymes: No results for input(s): CKTOTAL, CKMB, CKMBINDEX, TROPONINI in the last 168 hours. BNP (last 3 results) No results for input(s): PROBNP in the last 8760 hours. HbA1C: No results for input(s): HGBA1C in the last 72 hours. CBG: Recent Labs  Lab 05/27/19 1207 05/27/19 1657 05/28/19 0802 05/28/19 1119  GLUCAP 154* 126* 140* 161*   Lipid Profile: No results for input(s): CHOL, HDL, LDLCALC, TRIG, CHOLHDL, LDLDIRECT in the last 72 hours. Thyroid Function Tests: No results for input(s): TSH, T4TOTAL, FREET4, T3FREE, THYROIDAB in the last 72 hours. Anemia Panel: No results for input(s): VITAMINB12, FOLATE, FERRITIN, TIBC, IRON, RETICCTPCT in the last 72 hours. Sepsis Labs: No results for input(s): PROCALCITON, LATICACIDVEN in the last 168 hours.  Recent Results (from the past 240 hour(s))  SARS CORONAVIRUS 2 (TAT 6-24 HRS) Nasopharyngeal Nasopharyngeal Swab     Status: None   Collection Time: 05/27/19 12:12 AM   Specimen: Nasopharyngeal Swab  Result Value Ref Range Status   SARS Coronavirus 2 NEGATIVE NEGATIVE Final    Comment: (NOTE) SARS-CoV-2 target nucleic acids are NOT DETECTED. The SARS-CoV-2 RNA is generally detectable in upper and lower respiratory  specimens during the acute phase of infection. Negative results do not preclude SARS-CoV-2 infection, do not rule out co-infections with other pathogens, and should not be used as the sole basis for treatment or other patient management decisions. Negative results must be combined with clinical observations, patient history, and epidemiological information. The expected result is Negative. Fact Sheet for Patients: SugarRoll.be Fact Sheet for Healthcare Providers: https://www.woods-mathews.com/ This test is not yet approved or cleared by the [place] FDA and  has been authorized for detection and/or diagnosis of SARS-CoV-2 by FDA under an Emergency Use Authorization (EUA). This EUA will remain  in effect (meaning this test can be used) for the duration of the COVID-19 declaration under Section 56 4(b)(1) of the Act, 21 U.S.C. section 360bbb-3(b)(1), unless the authorization is terminated or revoked sooner. Performed at [facility], [place] [address], [place], [place] [address]   Urine culture     Status: None (Preliminary result)   Collection Time: 05/27/19  2:10 AM   Specimen: Urine, Random  Result Value Ref Range Status   Specimen Description   Final    URINE, RANDOM Performed at [place] [address], [place], [place] [address]    Special Requests   Final    NONE Performed at [facility], [place] [address], [place], [place] [address]    Culture   Final    CULTURE REINCUBATED FOR BETTER GROWTH Performed at [facility], [place] [address], [place], [place] [address]    Report Status PENDING  Incomplete  MRSA PCR Screening     Status: None   Collection Time: 05/28/19  8:36 AM   Specimen: Nasal Mucosa; Nasopharyngeal  Result Value Ref Range Status   MRSA by PCR NEGATIVE NEGATIVE Final    Comment:        The GeneXpert MRSA Assay (FDA approved for NASAL specimens only), is one  component of a comprehensive MRSA colonization surveillance program. It is not intended to diagnose MRSA infection nor to guide or monitor treatment for MRSA infections. Performed at [facility], [place] [address], [place], [place] [address]          Radiology Studies: US Renal  Result Date: 05/26/2019 CLINICAL DATA:  Renal insufficiency EXAM: RENAL / URINARY TRACT ULTRASOUND COMPLETE COMPARISON:  05/11/2019 FINDINGS: Right Kidney: Renal measurements: 8.5 x 4.5 x 4.2 cm = volume: 83 mL . Echogenicity within normal limits. No mass or hydronephrosis visualized. Left Kidney: Renal measurements: 8 x 3.8 x 4.6 cm = volume: 82 mL. Echogenicity within normal limits. No mass or hydronephrosis visualized. Bladder: The bladder was decompressed with a  Foley catheter. IMPRESSION: 1. Examination limited by patient body habitus. 2. No hydronephrosis. 3. Bladder decompressed with a Foley catheter. Electronically Signed   By: Constance Holster M.D.   On: 05/26/2019 23:45        Scheduled Meds: . aspirin  81 mg Oral Daily  . dicyclomine  10 mg Oral BID  . DULoxetine  20 mg Oral Daily  . enoxaparin (LOVENOX) injection  30 mg Subcutaneous Q24H  . feeding supplement (PRO-STAT SUGAR FREE 64)  30 mL Oral BID  . folic acid  1 mg Oral Daily  . hydrocortisone  5 mg Oral Daily  . insulin aspart  0-5 Units Subcutaneous QHS  . insulin aspart  0-9 Units Subcutaneous TID WC  . Melatonin  3 mg Oral QHS  . mirtazapine  7.5 mg Oral QHS  . Muscle Rub   Topical Daily  . polyvinyl alcohol  1 drop Both Eyes BID  . pravastatin  10 mg Oral QHS  . senna-docusate  1 tablet Oral BID  . sodium bicarbonate  1,300 mg Oral TID  . [START ON 05/29/2019] sodium zirconium cyclosilicate  5 g Oral Q M,W,F  . tamsulosin  0.4 mg Oral Daily  . cyanocobalamin  1,000 mcg Oral Daily   Continuous Infusions: . cefTRIAXone (ROCEPHIN)  IV 1 g (05/28/19 0604)     LOS: 1 day    Time spent: 81 min      Nicolette Bang, MD [facility]  If 7PM-7AM, please contact night-coverage  05/28/2019, 1:35 PM

## 2019-05-29 LAB — URINE CULTURE: Culture: >=100000 — AB

## 2019-05-29 LAB — GLUCOSE, CAPILLARY
Glucose-Capillary: 82 mg/dL (ref 70–99)
Glucose-Capillary: 122 mg/dL — ABNORMAL HIGH (ref 70–99)
Glucose-Capillary: 120 mg/dL — ABNORMAL HIGH (ref 70–99)
Glucose-Capillary: 134 mg/dL — ABNORMAL HIGH (ref 70–99)

## 2019-05-29 LAB — BASIC METABOLIC PANEL
Anion gap: 7 (ref 5–15)
BUN: 43 mg/dL — ABNORMAL HIGH (ref 8–23)
CO2: 18 mmol/L — ABNORMAL LOW (ref 22–32)
Calcium: 8.1 mg/dL — ABNORMAL LOW (ref 8.9–10.3)
Chloride: 113 mmol/L — ABNORMAL HIGH (ref 98–111)
Creatinine, Ser: 1.91 mg/dL — ABNORMAL HIGH (ref 0.44–1.00)
GFR calc Af Amer: 29 mL/min — ABNORMAL LOW (ref >60)
GFR calc non Af Amer: 25 mL/min — ABNORMAL LOW (ref >60)
Glucose, Bld: 92 mg/dL (ref 70–99)
Potassium: 5.4 mmol/L — ABNORMAL HIGH (ref 3.5–5.1)
Sodium: 138 mmol/L (ref 135–145)

## 2019-05-29 MED ORDER — INFLUENZA VAC A&B SA ADJ QUAD 0.5 ML IM PRSY
0.5000 mL | PREFILLED_SYRINGE | INTRAMUSCULAR | Status: AC
  Administered 2019-05-30: 0.5 mL via INTRAMUSCULAR
  Filled 2019-05-29: qty 0.5

## 2019-05-29 MED ORDER — HYDROCORTISONE 10 MG PO TABS
10.0000 mg | ORAL_TABLET | Freq: Every day | ORAL | Status: DC
  Administered 2019-05-29 – 2019-05-30 (×2): 10 mg via ORAL
  Filled 2019-05-29 (×2): qty 1

## 2019-05-29 MED ORDER — HYDROCORTISONE 5 MG PO TABS
5.0000 mg | ORAL_TABLET | Freq: Every day | ORAL | Status: DC
  Administered 2019-05-29: 5 mg via ORAL
  Filled 2019-05-29 (×2): qty 1

## 2019-05-29 MED ORDER — CHLORHEXIDINE GLUCONATE CLOTH 2 % EX PADS
6.0000 | MEDICATED_PAD | Freq: Every day | CUTANEOUS | Status: DC
  Administered 2019-05-29 – 2019-05-30 (×2): 6 via TOPICAL

## 2019-05-29 MED ORDER — SODIUM POLYSTYRENE SULFONATE 15 GM/60ML PO SUSP
30.0000 g | Freq: Once | ORAL | Status: AC
  Administered 2019-05-29: 30 g via ORAL
  Filled 2019-05-29: qty 120

## 2019-05-29 NOTE — TOC Progression Note (Signed)
Transition of Care [facility]) - Progression Note    Patient Details  Name: Stephanie Olson MRN: [id number] Date of Birth: Jun 16, 1941  Transition of Care [facility] CM/SW Contact  Purcell Mouton, RN Phone Number: 05/29/2019, 4:09 PM  Clinical Narrative:    Pt is a resident at [facility] and plan to return when stable. Will need a COVID test.       Expected Discharge Plan and Services                                                 Social Determinants of Health (SDOH) Interventions    Readmission Risk Interventions No flowsheet data found.

## 2019-05-29 NOTE — Progress Notes (Signed)
PROGRESS NOTE    [name]  [id number] DOB: May 04, 1941 DOA: 05/26/2019 PCP: Benito Mccreedy, MD   Brief Narrative:  [name] [id number] y.o.female,w77 y.o.femalewith history ofCKD-4, DM-2, HTN, anemia of chronic disease, diabetic neuropathy,PVD/angioplasty and arthrectomy in 2016 and urinary retention presenting from facility with hyperkalemia.Pt was recently hospitalized and discharge on 05/14/2019 for similar complaint, Pt is currently on lokemla on MWF as well as sodium bicarbonate.   Assessment & Plan:   Principal Problem:   Hyperkalemia Active Problems:   Essential hypertension   Anemia   ARF (acute renal failure) (HCC)   Acute lower UTI   Adrenal insufficiency (HCC)   Abnormal liver function   Hyperkalemia Repeat 5.4 Sodium bicarb 1 amp iv x1 Kayexalate 30gm po x1 again toady Repeat is improving although still elevated Extend fluids Daily bmp  ARF on CKD stage 3 Ns iv  Check cmp in am  Acute Lower Uti Urine culturereincubating Rocephin 1gm iv qday  Hyperlipidemia, PVD Cont Pravastatin 10mg  po qhs (note this can affect liver function, if lft worsens consider stopping) Cont aspirin 81mg  po qday  Dm2 (diet controlled0 fsbs ac and qhs, ISS  Abnormal liver function Neg acute hepatitis panel Prior CT scan abd/ pelvis 04/23/2019 Hepatobiliary: Layering stones within the moderately distended gallbladder. No focal liver abnormality. No bile duct dilatation seen.  H/o adrenal insufficiency Likely etiology of hyperkalemia Cont Cortef but dosing changed to bid to be more physiolog  Anxiety/ Depression Cont Cymbalta Cont Remeron (note this can affect liver function, if lft worsens consider stopping)  DVT prophylaxis: Lovenox SQ  Code Status: Full    Code Status Orders  (From admission, onward)         Start     Ordered   05/27/19 0727  Full code  Continuous     05/27/19 0728        Code Status History    Date  Active Date Inactive Code Status Order ID Comments User Context   05/10/2019 1707 05/14/2019 1744 Full Code [id number]  Mercy Riding, MD Inpatient   04/21/2019 2147 04/26/2019 1748 Full Code [id number]  Toy Baker, MD ED   04/22/2018 1812 04/28/2018 0310 Full Code [id number]  Kerney Elbe, [facility]   01/29/2015 2016 01/30/2015 2121 Full Code [id number]  Serafina Mitchell, MD Inpatient   01/29/2015 1733 01/29/2015 2016 Full Code [id number]  Angelia Mould, MD Inpatient   Advance Care Planning Activity    Advance Directive Documentation     Most Recent Value  Type of Advance Directive  Healthcare Power of Attorney  Pre-existing out of facility DNR order (yellow form or pink MOST form)  -  "MOST" Form in Place?  -     Family Communication: none today  Disposition Plan:   Patient remained inpatient for adjustment of steroids in the setting of adrenal insufficiency with hyperkalemia.  Without these treatments patient risk of severe life-threatening clinical deterioration with electrolyte abnormalities. Consults called: None Admission status: Inpatient   Consultants:   None  Procedures:  US Renal  Result Date: 05/26/2019 CLINICAL DATA:  Renal insufficiency EXAM: RENAL / URINARY TRACT ULTRASOUND COMPLETE COMPARISON:  05/11/2019 FINDINGS: Right Kidney: Renal measurements: 8.5 x 4.5 x 4.2 cm = volume: 83 mL . Echogenicity within normal limits. No mass or hydronephrosis visualized. Left Kidney: Renal measurements: 8 x 3.8 x 4.6 cm = volume: 82 mL. Echogenicity within normal limits. No mass or hydronephrosis visualized. Bladder: The bladder was decompressed with a Foley catheter. IMPRESSION: 1.  Examination limited by patient body habitus. 2. No hydronephrosis. 3. Bladder decompressed with a Foley catheter. Electronically Signed   By: Constance Holster M.D.   On: 05/26/2019 23:45   US Renal  Result Date: 05/11/2019 CLINICAL DATA:  Acute renal injury EXAM: RENAL / URINARY TRACT ULTRASOUND  COMPLETE COMPARISON:  04/23/2019 FINDINGS: Right Kidney: Renal measurements: 8.8 x 4.6 x 3.9 cm. = volume: 82 mL. No hydronephrosis is noted. A few small cysts are seen. The largest of these measures 1.3 cm. Left Kidney: Renal measurements: 9.8 x 5.5 x 4.0 cm. = volume: 114 mL. Echogenicity within normal limits. No mass or hydronephrosis visualized. Bladder: Decompressed by Foley catheter IMPRESSION: Right renal cyst better visualized than on prior CT examination. No hydronephrosis is noted. Electronically Signed   By: [name] M.D.   On: 05/11/2019 14:13     Antimicrobials:   none    Subjective: Patient tearful this morning Acute events overnight  Objective: Vitals:   05/28/19 0654 05/28/19 1237 05/29/19 0659 05/29/19 1333  BP: 135/60 115/74 100/63 109/75  Pulse: 70 84 75 89  Resp: 14 (!) 22 14 16   Temp: 97.7 F (36.5 C) 99.1 F (37.3 C) 98.9 F (37.2 C) 98.3 F (36.8 C)  TempSrc: Oral Oral Oral Oral  SpO2: 100% 99% 100% 100%  Weight: 70 kg  70.2 kg   Height:        Intake/Output Summary (Last 24 hours) at 05/29/2019 1337 Last data filed at 05/29/2019 1158 Gross per 24 hour  Intake 800 ml  Output 950 ml  Net -150 ml   Filed Weights   05/26/19 1759 05/28/19 0654 05/29/19 0659  Weight: 70.8 kg 70 kg 70.2 kg    Examination:  General exam: Appears calm and comfortable  Respiratory system: Clear to auscultation. Respiratory effort normal. Cardiovascular system: S1 & S2 heard, RRR. No JVD, murmurs, rubs, gallops or clicks. No pedal edema. Gastrointestinal system: Abdomen is nondistended, soft and nontender. No organomegaly or masses felt. Normal bowel sounds heard. Central nervous system: Alert and oriented. No focal neurological deficits. Extremities: wwp.no edema Skin: No rashes, lesions or ulcers Psychiatry: Judgement and insight appear normal. Mood & affect flat.     Data Reviewed: I have personally reviewed following labs and imaging studies  CBC: Recent  Labs  Lab 05/26/19 2330 05/28/19 0413  WBC 8.6 6.7  HGB 10.2* 8.4*  HCT 33.9* 28.3*  MCV 100.0 100.7*  PLT 236 [id number]   Basic Metabolic Panel: Recent Labs  Lab 05/27/19 0932 05/27/19 2158 05/28/19 0413 05/28/19 1652 05/29/19 0903  NA 139 139 141 140 138  K 6.2* 6.2* 6.0* 6.1* 5.4*  CL 115* 113* 116* 113* 113*  CO2 21* 20* 19* 19* 18*  GLUCOSE 109* 148* 117* 106* 92  BUN 50* 46* 45* 43* 43*  CREATININE 2.07* 2.04* 2.04* 2.05* 1.91*  CALCIUM 8.7* 8.1* 8.1* 8.1* 8.1*   GFR: Estimated Creatinine Clearance: 26.2 mL/min (A) (by C-G formula based on SCr of 1.91 mg/dL (H)). Liver Function Tests: Recent Labs  Lab 05/26/19 2042 05/28/19 0413  AST 25 18  ALT 49* 37  ALKPHOS 135* 114  BILITOT <0.1* 0.2*  PROT 7.3 6.3*  ALBUMIN 3.6 3.2*   No results for input(s): LIPASE, AMYLASE in the last 168 hours. No results for input(s): AMMONIA in the last 168 hours. Coagulation Profile: No results for input(s): INR, PROTIME in the last 168 hours. Cardiac Enzymes: No results for input(s): CKTOTAL, CKMB, CKMBINDEX, TROPONINI in the last 168  hours. BNP (last 3 results) No results for input(s): PROBNP in the last 8760 hours. HbA1C: No results for input(s): HGBA1C in the last 72 hours. CBG: Recent Labs  Lab 05/28/19 1119 05/28/19 1716 05/28/19 2102 05/29/19 0816 05/29/19 1130  GLUCAP 161* 100* 116* 82 122*   Lipid Profile: No results for input(s): CHOL, HDL, LDLCALC, TRIG, CHOLHDL, LDLDIRECT in the last 72 hours. Thyroid Function Tests: No results for input(s): TSH, T4TOTAL, FREET4, T3FREE, THYROIDAB in the last 72 hours. Anemia Panel: No results for input(s): VITAMINB12, FOLATE, FERRITIN, TIBC, IRON, RETICCTPCT in the last 72 hours. Sepsis Labs: No results for input(s): PROCALCITON, LATICACIDVEN in the last 168 hours.  Recent Results (from the past 240 hour(s))  SARS CORONAVIRUS 2 (TAT 6-24 HRS) Nasopharyngeal Nasopharyngeal Swab     Status: None   Collection Time: 05/27/19  12:12 AM   Specimen: Nasopharyngeal Swab  Result Value Ref Range Status   SARS Coronavirus 2 NEGATIVE NEGATIVE Final    Comment: (NOTE) SARS-CoV-2 target nucleic acids are NOT DETECTED. The SARS-CoV-2 RNA is generally detectable in upper and lower respiratory specimens during the acute phase of infection. Negative results do not preclude SARS-CoV-2 infection, do not rule out co-infections with other pathogens, and should not be used as the sole basis for treatment or other patient management decisions. Negative results must be combined with clinical observations, patient history, and epidemiological information. The expected result is Negative. Fact Sheet for Patients: SugarRoll.be Fact Sheet for Healthcare Providers: https://www.woods-mathews.com/ This test is not yet approved or cleared by the [place] FDA and  has been authorized for detection and/or diagnosis of SARS-CoV-2 by FDA under an Emergency Use Authorization (EUA). This EUA will remain  in effect (meaning this test can be used) for the duration of the COVID-19 declaration under Section 56 4(b)(1) of the Act, 21 U.S.C. section 360bbb-3(b)(1), unless the authorization is terminated or revoked sooner. Performed at [facility], [place] [address] [place], [place] [address]   Urine culture     Status: Abnormal   Collection Time: 05/27/19  2:10 AM   Specimen: Urine, Random  Result Value Ref Range Status   Specimen Description   Final    URINE, RANDOM Performed at [facility] [address], [place], [place] [address]    Special Requests   Final    NONE Performed at [facility], [place] [address], [place], [place] [address]    Culture >=100,000 COLONIES/mL ENTEROCOCCUS FAECALIS (A)  Final   Report Status 05/29/2019 FINAL  Final   Organism ID, Bacteria ENTEROCOCCUS FAECALIS (A)  Final      Susceptibility   Enterococcus faecalis -  MIC*    AMPICILLIN <=2 SENSITIVE Sensitive     LEVOFLOXACIN >=8 RESISTANT Resistant     NITROFURANTOIN <=16 SENSITIVE Sensitive     VANCOMYCIN 1 SENSITIVE Sensitive     * >=100,000 COLONIES/mL ENTEROCOCCUS FAECALIS  MRSA PCR Screening     Status: None   Collection Time: 05/28/19  8:36 AM   Specimen: Nasal Mucosa; Nasopharyngeal  Result Value Ref Range Status   MRSA by PCR NEGATIVE NEGATIVE Final    Comment:        The GeneXpert MRSA Assay (FDA approved for NASAL specimens only), is one component of a comprehensive MRSA colonization surveillance program. It is not intended to diagnose MRSA infection nor to guide or monitor treatment for MRSA infections. Performed at [facility], [place] [address] [place], [place] [address]  Radiology Studies: No results found.      Scheduled Meds: . aspirin  81 mg Oral Daily  . dicyclomine  10 mg Oral BID  . DULoxetine  20 mg Oral Daily  . enoxaparin (LOVENOX) injection  30 mg Subcutaneous Q24H  . feeding supplement (PRO-STAT SUGAR FREE 64)  30 mL Oral BID  . folic acid  1 mg Oral Daily  . hydrocortisone  10 mg Oral Daily  . hydrocortisone  5 mg Oral Daily  . [START ON 05/30/2019] influenza vaccine adjuvanted  0.5 mL Intramuscular Tomorrow-1000  . insulin aspart  0-5 Units Subcutaneous QHS  . insulin aspart  0-9 Units Subcutaneous TID WC  . Melatonin  3 mg Oral QHS  . mirtazapine  7.5 mg Oral QHS  . Muscle Rub   Topical Daily  . polyvinyl alcohol  1 drop Both Eyes BID  . pravastatin  10 mg Oral QHS  . senna-docusate  1 tablet Oral BID  . sodium bicarbonate  1,300 mg Oral TID  . sodium zirconium cyclosilicate  5 g Oral Q M,W,F  . tamsulosin  0.4 mg Oral Daily  . cyanocobalamin  1,000 mcg Oral Daily   Continuous Infusions: . cefTRIAXone (ROCEPHIN)  IV Stopped (05/29/19 [id number])     LOS: 2 days    Time spent: 35 min    Nicolette Bang, MD [facility]  If 7PM-7AM, please contact  night-coverage  05/29/2019, 1:37 PM

## 2019-05-30 LAB — BASIC METABOLIC PANEL
Anion gap: 8 (ref 5–15)
BUN: 40 mg/dL — ABNORMAL HIGH (ref 8–23)
CO2: 21 mmol/L — ABNORMAL LOW (ref 22–32)
Calcium: 7.9 mg/dL — ABNORMAL LOW (ref 8.9–10.3)
Chloride: 110 mmol/L (ref 98–111)
Creatinine, Ser: 1.87 mg/dL — ABNORMAL HIGH (ref 0.44–1.00)
GFR calc Af Amer: 30 mL/min — ABNORMAL LOW (ref >60)
GFR calc non Af Amer: 26 mL/min — ABNORMAL LOW (ref >60)
Glucose, Bld: 92 mg/dL (ref 70–99)
Potassium: 4.7 mmol/L (ref 3.5–5.1)
Sodium: 139 mmol/L (ref 135–145)

## 2019-05-30 LAB — GLUCOSE, CAPILLARY
Glucose-Capillary: 74 mg/dL (ref 70–99)
Glucose-Capillary: 124 mg/dL — ABNORMAL HIGH (ref 70–99)

## 2019-05-30 MED ORDER — HYDROCORTISONE 10 MG PO TABS: 10.0000 mg | ORAL_TABLET | Freq: Every day | ORAL | 0 refills | Status: DC

## 2019-05-30 MED ORDER — NITROFURANTOIN MONOHYD MACRO 100 MG PO CAPS: 100.0000 mg | ORAL_CAPSULE | Freq: Two times a day (BID) | ORAL | 0 refills | Status: AC

## 2019-05-30 NOTE — Progress Notes (Signed)
Called report to Lezlie Lye at receiving facility. Awaiting for [facility] for transportation to facility.

## 2019-05-30 NOTE — Discharge Summary (Signed)
Physician Discharge Summary  Stephanie Olson [id number] DOB: [birth year] DOA: 05/26/2019  PCP: Benito Mccreedy, MD  Admit date: 05/26/2019 Discharge date: 05/30/2019  Admitted From: Inpatient Disposition: SNF  Recommendations for Outpatient Follow-up:  1. Follow up with PCP in 1-2 weeks 2. Check bmp for potassium in 2-3 days  Home Health:No Equipment/Devices:no new equipment  Discharge Condition:Stable CODE STATUS:Full code Diet recommendation: Regular healthy diet  Brief/Interim Summary: Stephanie Olson Q000111Q y.o.female,w77 y.o.femalewith history ofCKD-4, DM-2, HTN, anemia of chronic disease, diabetic neuropathy,PVD/angioplasty and arthrectomy in 2016 and urinary retention presenting from facility with hyperkalemia.Pt was recently hospitalized and discharge on 05/14/2019 for similar complaint, Pt is currently on lokemla on MWF as well as sodium bicarbonate  Hospital course: Per kalemia.  Patient was treated by protocol with improvement in potassium.  I did adjust patient's treatment for adrenal insufficiency likely contributing to the hyperkalemia.  Today patient potassium 4.7.  Should continue home medications.  I asked that a BMP checked be checked in 2 to 3 days for potassium level. In the hospital patient was also noted to have acute kidney injury superimposed on CKD stage III.  This improved with gentle hydration.  She was also treated with a short course of antibiotics although reported no symptoms for UTI.  Finally patient was continued on home medications for hyperlipidemia and anxiety depression without acute changes.  Discharge Diagnoses:  Principal Problem:   Hyperkalemia Active Problems:   Essential hypertension   Anemia   ARF (acute renal failure) (HCC)   Acute lower UTI   Adrenal insufficiency (HCC)   Abnormal liver function    Discharge Instructions  Discharge Instructions    Call MD for:   Complete by: As directed    Any acute change in  medical condition   Diet - low sodium heart healthy   Complete by: As directed    Increase activity slowly   Complete by: As directed      Allergies as of 05/30/2019   No Known Allergies     Medication List    TAKE these medications   acetaminophen 500 MG tablet Commonly known as: TYLENOL Take 500 mg by mouth 2 (two) times daily as needed for mild pain.   aspirin 81 MG chewable tablet Chew 81 mg by mouth daily.   bisacodyl 10 MG suppository Commonly known as: DULCOLAX Place 1 suppository (10 mg total) rectally daily as needed for moderate constipation.   cyanocobalamin 1000 MCG tablet Take 1 tablet (1,000 mcg total) by mouth daily.   dicyclomine 10 MG capsule Commonly known as: BENTYL Take 10 mg by mouth 2 (two) times daily.   DULoxetine 20 MG capsule Commonly known as: CYMBALTA Take 20 mg by mouth daily.   feeding supplement (PRO-STAT SUGAR FREE 64) Liqd Take 30 mLs by mouth 2 (two) times daily.   folic acid 1 MG tablet Commonly known as: FOLVITE Take 1 tablet (1 mg total) by mouth daily.   HYDROcodone-acetaminophen 5-325 MG tablet Commonly known as: NORCO/VICODIN Take 1 tablet by mouth every 4 (four) hours as needed for moderate pain.   hydrocortisone 10 MG tablet Commonly known as: CORTEF Take 1 tablet (10 mg total) by mouth daily. Start taking on: May 31, 2019 What changed:   medication strength  how much to take   megestrol 400 MG/10ML suspension Commonly known as: MEGACE Take 10 mLs (400 mg total) by mouth daily.   Melatonin 3 MG Tabs Take 3 mg by mouth at bedtime.   mirtazapine 7.5 MG tablet  Commonly known as: REMERON Take 7.5 mg by mouth at bedtime.   ondansetron 4 MG tablet Commonly known as: ZOFRAN Take 1 tablet (4 mg total) by mouth every 6 (six) hours as needed for nausea.   polyvinyl alcohol 1.4 % ophthalmic solution Commonly known as: LIQUIFILM TEARS Place 1 drop into both eyes 2 (two) times daily.   pravastatin 20 MG  tablet Commonly known as: PRAVACHOL Take 10 mg by mouth at bedtime.   senna-docusate 8.6-50 MG tablet Commonly known as: Senokot-S Take 1 tablet by mouth 2 (two) times daily.   sodium bicarbonate 650 MG tablet Take 2 tablets (1,300 mg total) by mouth 3 (three) times daily.   sodium zirconium cyclosilicate 5 g packet Commonly known as: LOKELMA Take 5 g by mouth every Monday, Wednesday, and Friday.   tamsulosin 0.4 MG Caps capsule Commonly known as: FLOMAX Take 0.4 mg by mouth daily.   trolamine salicylate 10 % cream Commonly known as: ASPERCREME Apply 1 application topically daily. Right lateral ankle       No Known Allergies  Consultations:  None   Procedures/Studies: US Renal  Result Date: 05/26/2019 CLINICAL DATA:  Renal insufficiency EXAM: RENAL / URINARY TRACT ULTRASOUND COMPLETE COMPARISON:  05/11/2019 FINDINGS: Right Kidney: Renal measurements: 8.5 x 4.5 x 4.2 cm = volume: 83 mL . Echogenicity within normal limits. No mass or hydronephrosis visualized. Left Kidney: Renal measurements: 8 x 3.8 x 4.6 cm = volume: 82 mL. Echogenicity within normal limits. No mass or hydronephrosis visualized. Bladder: The bladder was decompressed with a Foley catheter. IMPRESSION: 1. Examination limited by patient body habitus. 2. No hydronephrosis. 3. Bladder decompressed with a Foley catheter. Electronically Signed   By: Constance Holster M.D.   On: 05/26/2019 23:45   US Renal  Result Date: 05/11/2019 CLINICAL DATA:  Acute renal injury EXAM: RENAL / URINARY TRACT ULTRASOUND COMPLETE COMPARISON:  04/23/2019 FINDINGS: Right Kidney: Renal measurements: 8.8 x 4.6 x 3.9 cm. = volume: 82 mL. No hydronephrosis is noted. A few small cysts are seen. The largest of these measures 1.3 cm. Left Kidney: Renal measurements: 9.8 x 5.5 x 4.0 cm. = volume: 114 mL. Echogenicity within normal limits. No mass or hydronephrosis visualized. Bladder: Decompressed by Foley catheter IMPRESSION: Right renal cyst  better visualized than on prior CT examination. No hydronephrosis is noted. Electronically Signed   By: [name] M.D.   On: 05/11/2019 14:13       Subjective: Patient happy with the idea being discharged today no complaints overnight Comfortably in bed this morning eating breakfast  Discharge Exam: Vitals:   05/29/19 2048 05/30/19 0420  BP: 115/66 114/65  Pulse: 83 74  Resp: 16 16  Temp: 98.7 F (37.1 C) 99 F (37.2 C)  SpO2: 100% 99%   Vitals:   05/29/19 1333 05/29/19 2048 05/30/19 0416 05/30/19 0420  BP: 109/75 115/66  114/65  Pulse: 89 83  74  Resp: 16 16  16   Temp: 98.3 F (36.8 C) 98.7 F (37.1 C)  99 F (37.2 C)  TempSrc: Oral Oral  Oral  SpO2: 100% 100%  99%  Weight:   71.3 kg   Height:        General: Pt is alert, awake, not in acute distress Cardiovascular: RRR, S1/S2 +, no rubs, no gallops Respiratory: CTA bilaterally, no wheezing, no rhonchi Abdominal: Soft, NT, ND, bowel sounds + Extremities: no edema, no cyanosis    The results of significant diagnostics from this hospitalization (including imaging, microbiology, ancillary  and laboratory) are listed below for reference.     Microbiology: Recent Results (from the past 240 hour(s))  SARS CORONAVIRUS 2 (TAT 6-24 HRS) Nasopharyngeal Nasopharyngeal Swab     Status: None   Collection Time: 05/27/19 12:12 AM   Specimen: Nasopharyngeal Swab  Result Value Ref Range Status   SARS Coronavirus 2 NEGATIVE NEGATIVE Final    Comment: (NOTE) SARS-CoV-2 target nucleic acids are NOT DETECTED. The SARS-CoV-2 RNA is generally detectable in upper and lower respiratory specimens during the acute phase of infection. Negative results do not preclude SARS-CoV-2 infection, do not rule out co-infections with other pathogens, and should not be used as the sole basis for treatment or other patient management decisions. Negative results must be combined with clinical observations, patient history, and epidemiological  information. The expected result is Negative. Fact Sheet for Patients: SugarRoll.be Fact Sheet for Healthcare Providers: https://www.woods-mathews.com/ This test is not yet approved or cleared by the [place] FDA and  has been authorized for detection and/or diagnosis of SARS-CoV-2 by FDA under an Emergency Use Authorization (EUA). This EUA will remain  in effect (meaning this test can be used) for the duration of the COVID-19 declaration under Section 56 4(b)(1) of the Act, 21 U.S.C. section 360bbb-3(b)(1), unless the authorization is terminated or revoked sooner. Performed at [facility], [place] [address], [place], [place] [address]   Urine culture     Status: Abnormal   Collection Time: 05/27/19  2:10 AM   Specimen: Urine, Random  Result Value Ref Range Status   Specimen Description   Final    URINE, RANDOM Performed at [place] [address] [place], [place] [address]    Special Requests   Final    NONE Performed at [facility], [place] [address], [place], [place] [address]    Culture >=100,000 COLONIES/mL ENTEROCOCCUS FAECALIS (A)  Final   Report Status 05/29/2019 FINAL  Final   Organism ID, Bacteria ENTEROCOCCUS FAECALIS (A)  Final      Susceptibility   Enterococcus faecalis - MIC*    AMPICILLIN <=2 SENSITIVE Sensitive     LEVOFLOXACIN >=8 RESISTANT Resistant     NITROFURANTOIN <=16 SENSITIVE Sensitive     VANCOMYCIN 1 SENSITIVE Sensitive     * >=100,000 COLONIES/mL ENTEROCOCCUS FAECALIS  MRSA PCR Screening     Status: None   Collection Time: 05/28/19  8:36 AM   Specimen: Nasal Mucosa; Nasopharyngeal  Result Value Ref Range Status   MRSA by PCR NEGATIVE NEGATIVE Final    Comment:        The GeneXpert MRSA Assay (FDA approved for NASAL specimens only), is one component of a comprehensive MRSA colonization surveillance program. It is not intended to diagnose  MRSA infection nor to guide or monitor treatment for MRSA infections. Performed at [facility], [place] [address] [place], [place] [address]      Labs: BNP (last 3 results) No results for input(s): BNP in the last 8760 hours. Basic Metabolic Panel: Recent Labs  Lab 05/27/19 2158 05/28/19 0413 05/28/19 1652 05/29/19 0903 05/30/19 0348  NA 139 141 140 138 139  K 6.2* 6.0* 6.1* 5.4* 4.7  CL 113* 116* 113* 113* 110  CO2 20* 19* 19* 18* 21*  GLUCOSE 148* 117* 106* 92 92  BUN 46* 45* 43* 43* 40*  CREATININE 2.04* 2.04* 2.05* 1.91* 1.87*  CALCIUM 8.1* 8.1* 8.1* 8.1* 7.9*   Liver Function Tests: Recent Labs  Lab 05/26/19 2042  05/28/19 0413  AST 25 18  ALT 49* 37  ALKPHOS 135* 114  BILITOT <0.1* 0.2*  PROT 7.3 6.3*  ALBUMIN 3.6 3.2*   No results for input(s): LIPASE, AMYLASE in the last 168 hours. No results for input(s): AMMONIA in the last 168 hours. CBC: Recent Labs  Lab 05/26/19 2330 05/28/19 0413  WBC 8.6 6.7  HGB 10.2* 8.4*  HCT 33.9* 28.3*  MCV 100.0 100.7*  PLT 236 200   Cardiac Enzymes: No results for input(s): CKTOTAL, CKMB, CKMBINDEX, TROPONINI in the last 168 hours. BNP: Invalid input(s): POCBNP CBG: Recent Labs  Lab 05/29/19 0816 05/29/19 1130 05/29/19 1645 05/29/19 2045 05/30/19 0731  GLUCAP 82 122* 120* 134* 74   D-Dimer No results for input(s): DDIMER in the last 72 hours. Hgb A1c No results for input(s): HGBA1C in the last 72 hours. Lipid Profile No results for input(s): CHOL, HDL, LDLCALC, TRIG, CHOLHDL, LDLDIRECT in the last 72 hours. Thyroid function studies No results for input(s): TSH, T4TOTAL, T3FREE, THYROIDAB in the last 72 hours.  Invalid input(s): FREET3 Anemia work up No results for input(s): VITAMINB12, FOLATE, FERRITIN, TIBC, IRON, RETICCTPCT in the last 72 hours. Urinalysis    Component Value Date/Time   COLORURINE YELLOW 05/27/2019 0210   APPEARANCEUR CLOUDY (A) 05/27/2019 0210   LABSPEC 1.015  05/27/2019 0210   PHURINE 6.0 05/27/2019 0210   GLUCOSEU NEGATIVE 05/27/2019 0210   HGBUR SMALL (A) 05/27/2019 0210   BILIRUBINUR NEGATIVE 05/27/2019 0210   KETONESUR NEGATIVE 05/27/2019 0210   PROTEINUR 100 (A) 05/27/2019 0210   UROBILINOGEN 0.2 03/03/2010 0808   NITRITE NEGATIVE 05/27/2019 0210   LEUKOCYTESUR LARGE (A) 05/27/2019 0210   Sepsis Labs Invalid input(s): PROCALCITONIN,  WBC,  LACTICIDVEN Microbiology Recent Results (from the past 240 hour(s))  SARS CORONAVIRUS 2 (TAT 6-24 HRS) Nasopharyngeal Nasopharyngeal Swab     Status: None   Collection Time: 05/27/19 12:12 AM   Specimen: Nasopharyngeal Swab  Result Value Ref Range Status   SARS Coronavirus 2 NEGATIVE NEGATIVE Final    Comment: (NOTE) SARS-CoV-2 target nucleic acids are NOT DETECTED. The SARS-CoV-2 RNA is generally detectable in upper and lower respiratory specimens during the acute phase of infection. Negative results do not preclude SARS-CoV-2 infection, do not rule out co-infections with other pathogens, and should not be used as the sole basis for treatment or other patient management decisions. Negative results must be combined with clinical observations, patient history, and epidemiological information. The expected result is Negative. Fact Sheet for Patients: SugarRoll.be Fact Sheet for Healthcare Providers: https://www.woods-mathews.com/ This test is not yet approved or cleared by the [place] FDA and  has been authorized for detection and/or diagnosis of SARS-CoV-2 by FDA under an Emergency Use Authorization (EUA). This EUA will remain  in effect (meaning this test can be used) for the duration of the COVID-19 declaration under Section 56 4(b)(1) of the Act, 21 U.S.C. section 360bbb-3(b)(1), unless the authorization is terminated or revoked sooner. Performed at [facility], [place] [address], [place], [place] [address]   Urine culture     Status:  Abnormal   Collection Time: 05/27/19  2:10 AM   Specimen: Urine, Random  Result Value Ref Range Status   Specimen Description   Final    URINE, RANDOM Performed at [facility] [address], [place], [place] [address]    Special Requests   Final    NONE Performed at [facility], [place] [address] [place], [place] [address]  Culture >=100,000 COLONIES/mL ENTEROCOCCUS FAECALIS (A)  Final   Report Status 05/29/2019 FINAL  Final   Organism ID, Bacteria ENTEROCOCCUS FAECALIS (A)  Final      Susceptibility   Enterococcus faecalis - MIC*    AMPICILLIN <=2 SENSITIVE Sensitive     LEVOFLOXACIN >=8 RESISTANT Resistant     NITROFURANTOIN <=16 SENSITIVE Sensitive     VANCOMYCIN 1 SENSITIVE Sensitive     * >=100,000 COLONIES/mL ENTEROCOCCUS FAECALIS  MRSA PCR Screening     Status: None   Collection Time: 05/28/19  8:36 AM   Specimen: Nasal Mucosa; Nasopharyngeal  Result Value Ref Range Status   MRSA by PCR NEGATIVE NEGATIVE Final    Comment:        The GeneXpert MRSA Assay (FDA approved for NASAL specimens only), is one component of a comprehensive MRSA colonization surveillance program. It is not intended to diagnose MRSA infection nor to guide or monitor treatment for MRSA infections. Performed at [facility], [place] [address], [place], [place] [address]      Time coordinating discharge: Over 30 minutes  SIGNED:   Nicolette Bang, MD  [facility] 05/30/2019, 10:25 AM Pager   If 7PM-7AM, please contact night-coverage [email or web address] Password [email or web address]

## 2019-05-30 NOTE — NC FL2 (Signed)
[place] LEVEL OF CARE SCREENING TOOL     IDENTIFICATION  Patient Name: Stephanie Olson Birthdate: 18-Nov-1940 Sex: female Admission Date (Current Location): 05/26/2019  [facility] and [place] Number:  Herbalist and Address:  [facility],  [place] [address] [place]      Provider Number: [id number]  Attending Physician Name and Address:  Marcell Anger*  Relative Name and Phone Number:       Current Level of Care: Hospital Recommended Level of Care: [place] Prior Approval Number:    Date Approved/Denied:   PASRR Number:    Discharge Plan: SNF    Current Diagnoses: Patient Active Problem List   Diagnosis Date Noted  . Abnormal liver function 05/27/2019  . Diet-controlled diabetes mellitus ([place])   . Adrenal insufficiency ([place])   . Metabolic acidemia   . Acute lower UTI 04/22/2019  . Metabolic acidosis Q000111Q  . Malnutrition of moderate degree 04/25/2018  . Pressure injury of skin 04/23/2018  . ARF (acute renal failure) ([place])   . Acute urinary retention   . AKI (acute kidney injury) ([place])   . Renal failure (ARF), acute on chronic (HCC) 04/22/2018  . Essential hypertension 04/22/2018  . Anemia 04/22/2018  . Metabolic acidosis, normal anion gap (NAG) 04/22/2018  . Hyperkalemia 04/22/2018  . Aspiration into airway 04/22/2018  . Generalized weakness 04/22/2018  . Lethargy 04/22/2018  . Poor appetite 04/22/2018  . FTT (failure to thrive) in adult 04/22/2018  . Diabetic neuropathy ([place]) 11/21/2015  . 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/2015  . Diabetic ulcer of foot, limited to breakdown of skin ([place]) 11/21/2015  . Foot pain, left 02/14/2015  . PVD (peripheral vascular disease) ([place]) 01/29/2015    Orientation RESPIRATION BLADDER Height & Weight     Self, Time, Situation, Place  Normal External catheter Weight: 71.3 kg Height:  5\' 9"  (175.3 cm)  BEHAVIORAL SYMPTOMS/MOOD NEUROLOGICAL BOWEL  NUTRITION STATUS      Continent Diet  AMBULATORY STATUS COMMUNICATION OF NEEDS Skin   Extensive Assist Verbally Normal                       Personal Care Assistance Level of Assistance  Bathing, Feeding, Dressing Bathing Assistance: Maximum assistance Feeding assistance: Limited assistance Dressing Assistance: Maximum assistance     Functional Limitations Info  Sight, Hearing, Speech Sight Info: Impaired(glasses) Hearing Info: Adequate Speech Info: Adequate    SPECIAL CARE FACTORS FREQUENCY                       Contractures Contractures Info: Not present    Additional Factors Info  Code Status, Allergies Code Status Info: FULL Allergies Info: No Known Allergies           Current Medications (05/30/2019):  This is the current hospital active medication list Current Facility-Administered Medications  Medication Dose Route Frequency Provider Last Rate Last Dose  . acetaminophen (TYLENOL) tablet 650 mg  650 mg Oral Q6H PRN Jani Gravel, MD   650 mg at 05/29/19 1026   Or  . acetaminophen (TYLENOL) suppository 650 mg  650 mg Rectal Q6H PRN Jani Gravel, MD      . aspirin chewable tablet 81 mg  81 mg Oral Daily Jani Gravel, MD   81 mg at 05/30/19 0941  . bisacodyl (DULCOLAX) suppository 10 mg  10 mg Rectal Daily PRN Jani Gravel, MD   10 mg at 05/27/19 1648  . Chlorhexidine  Gluconate Cloth 2 % PADS 6 each  6 each Topical Daily Spongberg, Audie Pinto, MD   6 each at 05/30/19 [phone]  . dicyclomine (BENTYL) capsule 10 mg  10 mg Oral BID Jani Gravel, MD   10 mg at 05/30/19 0941  . DULoxetine (CYMBALTA) DR capsule 20 mg  20 mg Oral Daily Jani Gravel, MD   20 mg at 05/30/19 0941  . enoxaparin (LOVENOX) injection 30 mg  30 mg Subcutaneous Q24H Jani Gravel, MD   30 mg at 05/30/19 0941  . feeding supplement (PRO-STAT SUGAR FREE 64) liquid 30 mL  30 mL Oral BID Jani Gravel, MD   30 mL at 05/30/19 0941  . folic acid (FOLVITE) tablet 1 mg  1 mg Oral Daily Jani Gravel, MD   1 mg at  05/30/19 0940  . HYDROcodone-acetaminophen (NORCO/VICODIN) 5-325 MG per tablet 1 tablet  1 tablet Oral Q4H PRN Jani Gravel, MD   1 tablet at 05/30/19 0955  . hydrocortisone (CORTEF) tablet 10 mg  10 mg Oral Daily Marcell Anger, MD   10 mg at 05/30/19 0941  . hydrocortisone (CORTEF) tablet 5 mg  5 mg Oral Daily Marcell Anger, MD   5 mg at 05/29/19 1750  . insulin aspart (novoLOG) injection 0-5 Units  0-5 Units Subcutaneous QHS Jani Gravel, MD      . insulin aspart (novoLOG) injection 0-9 Units  0-9 Units Subcutaneous TID WC Jani Gravel, MD   1 Units at 05/29/19 1229  . Melatonin TABS 3 mg  3 mg Oral QHS Jani Gravel, MD   3 mg at 05/29/19 2231  . mirtazapine (REMERON) tablet 7.5 mg  7.5 mg Oral QHS Jani Gravel, MD   7.5 mg at 05/29/19 2230  . Muscle Rub CREA   Topical Daily Spongberg, Audie Pinto, MD      . ondansetron [facility]) tablet 4 mg  4 mg Oral Q6H PRN Jani Gravel, MD      . polyvinyl alcohol (LIQUIFILM TEARS) 1.4 % ophthalmic solution 1 drop  1 drop Both Eyes BID Jani Gravel, MD   1 drop at 05/30/19 0941  . pravastatin (PRAVACHOL) tablet 10 mg  10 mg Oral Loma Sousa, MD   10 mg at 05/29/19 2230  . senna-docusate (Senokot-S) tablet 1 tablet  1 tablet Oral BID Jani Gravel, MD   1 tablet at 05/30/19 0940  . sodium bicarbonate tablet 1,300 mg  1,300 mg Oral TID Jani Gravel, MD   1,300 mg at 05/30/19 0941  . sodium zirconium cyclosilicate (LOKELMA) packet 5 g  5 g Oral Q M,W,F Jani Gravel, MD   5 g at 05/29/19 1152  . tamsulosin (FLOMAX) capsule 0.4 mg  0.4 mg Oral Daily Jani Gravel, MD   0.4 mg at 05/30/19 0940  . vitamin B-12 (CYANOCOBALAMIN) tablet 1,000 mcg  1,000 mcg Oral Daily Jani Gravel, MD   1,000 mcg at 05/30/19 [id number]     Discharge Medications: Please see discharge summary for a list of discharge medications.  Relevant Imaging Results:  Relevant Lab Results:   Additional Information [id number]  Purcell Mouton, RN

## 2019-05-30 NOTE — Care Management Important Message (Signed)
Important Message  Patient Details IM Letter given to Cookie McGibboney RN to present to the Patient Name: Stephanie Olson MRN: [id number] Date of Birth: [birth year]   Medicare Important Message Given:  Yes     Kerin Salen 05/30/2019, 9:55 AM

## 2019-06-29 ENCOUNTER — Other Ambulatory Visit: Payer: Self-pay

## 2019-06-29 ENCOUNTER — Ambulatory Visit (INDEPENDENT_AMBULATORY_CARE_PROVIDER_SITE_OTHER): Payer: Medicare Other | Admitting: Endocrinology

## 2019-06-29 ENCOUNTER — Encounter: Payer: Self-pay | Admitting: Endocrinology

## 2019-06-29 VITALS — BP 134/80 | HR 88 | Ht 69.0 in | Wt 156.0 lb

## 2019-06-29 DIAGNOSIS — E2740 Unspecified adrenocortical insufficiency: Secondary | ICD-10-CM

## 2019-06-29 MED ORDER — FLUDROCORTISONE ACETATE 0.1 MG PO TABS: 0.1000 mg | ORAL_TABLET | Freq: Every day | ORAL | 11 refills | Status: DC

## 2019-06-29 NOTE — Patient Instructions (Addendum)
Please discontinue hydrocortisone and megestrol. Please start fludrocortisone, 100 mcg po daily.   Please come back for a follow-up appointment in 2 weeks, when we'll plan to recheck the ACTH stim test, and the BMET.

## 2019-06-29 NOTE — Progress Notes (Signed)
Subjective:    Patient ID: Stephanie Olson, female    DOB: [birth year], 78 y.o.   MRN: [id number]  HPI  Pt is referred by Dr Sunny Schlein, for poss Addison's Disease.  no h/o abdominal or brain injury.  No h/o cancer, thyroid problems, seizures, hypoglycemia, amyloidosis, or tuberculosis.  she has never been on steroid therapy prior to hosp visit.  No h/o ketoconazole, rifampin, or dilantin.  She has slight headache, but no assoc dizziness.  She lives at [facility] and [place].   Past Medical History:  Diagnosis Date  . Arthritis   . Diabetes mellitus without complication ([place])   . Diabetic foot ulcers (HCC)    RIGHT   . Hypertension   . Peripheral vascular disease [facility])     Past Surgical History:  Procedure Laterality Date  . ABDOMINAL AORTAGRAM  01/29/2015  . ATHERECTOMY Right 01/29/2015   FEMORAL ARTERY   . BALLOON ANGIOPLASTY, ARTERY Right 01/29/2015   RT FEMORAL   . FOOT SURGERY    . PERIPHERAL VASCULAR CATHETERIZATION N/A 01/29/2015   Procedure: Abdominal Aortogram w/Lower Extremity;  Surgeon: Serafina Mitchell, MD;  Location: [place] [facility];  Service: Cardiovascular;  Laterality: N/A;    Social History   Socioeconomic History  . Marital status: Divorced    Spouse name: Not on file  . Number of children: Not on file  . Years of education: Not on file  . Highest education level: Not on file  Occupational History  . Not on file  Social Needs  . Financial resource strain: Not hard at all  . Food insecurity    Worry: Never true    Inability: Never true  . Transportation needs    Medical: No    Non-medical: No  Tobacco Use  . Smoking status: Current Some Day Smoker    Types: Cigarettes    Last attempt to quit: 02/13/2014    Years since quitting: 5.3  . Smokeless tobacco: Never Used  . Tobacco comment: " I quit smoking along time ago "  Substance and Sexual Activity  . Alcohol use: No    Alcohol/week: 0.0 standard drinks  . Drug use: No  . Sexual activity: Not Currently   Lifestyle  . Physical activity    Days per week: 4 days    Minutes per session: 10 min  . Stress: Not at all  Relationships  . Social Herbalist on phone: Three times a week    Gets together: More than three times a week    Attends religious service: Never    Active member of club or organization: No    Attends meetings of clubs or organizations: Never    Relationship status: Widowed  . Intimate partner violence    Fear of current or ex partner: Not on file    Emotionally abused: Not on file    Physically abused: Not on file    Forced sexual activity: Not on file  Other Topics Concern  . Not on file  Social History Narrative  . Not on file    Current Outpatient Medications on File Prior to Visit  Medication Sig Dispense Refill  . acetaminophen (TYLENOL) 500 MG tablet Take 500 mg by mouth 2 (two) times daily as needed for mild pain.     . Amino Acids-Protein Hydrolys (FEEDING SUPPLEMENT, PRO-STAT SUGAR FREE 64,) LIQD Take 30 mLs by mouth 2 (two) times daily. 887 mL 0  . aspirin 81 MG chewable tablet  Chew 81 mg by mouth daily.    . bisacodyl (DULCOLAX) 10 MG suppository Place 1 suppository (10 mg total) rectally daily as needed for moderate constipation. 12 suppository 0  . dicyclomine (BENTYL) 10 MG capsule Take 10 mg by mouth 2 (two) times daily.    . DULoxetine (CYMBALTA) 20 MG capsule Take 20 mg by mouth daily.    . folic acid (FOLVITE) 1 MG tablet Take 1 tablet (1 mg total) by mouth daily.    Marland Kitchen HYDROcodone-acetaminophen (NORCO/VICODIN) 5-325 MG tablet Take 1 tablet by mouth every 4 (four) hours as needed for moderate pain. 5 tablet 0  . Melatonin 3 MG TABS Take 3 mg by mouth at bedtime.     . mirtazapine (REMERON) 7.5 MG tablet Take 7.5 mg by mouth at bedtime.    . ondansetron (ZOFRAN) 4 MG tablet Take 1 tablet (4 mg total) by mouth every 6 (six) hours as needed for nausea. 20 tablet 0  . polyvinyl alcohol (LIQUIFILM TEARS) 1.4 % ophthalmic solution Place 1 drop  into both eyes 2 (two) times daily.    . pravastatin (PRAVACHOL) 20 MG tablet Take 10 mg by mouth at bedtime.    . senna-docusate (SENOKOT-S) 8.6-50 MG tablet Take 1 tablet by mouth 2 (two) times daily.    . sodium bicarbonate 650 MG tablet Take 2 tablets (1,300 mg total) by mouth 3 (three) times daily.    . sodium zirconium cyclosilicate (LOKELMA) 5 g packet Take 5 g by mouth every Monday, Wednesday, and Friday.    . tamsulosin (FLOMAX) 0.4 MG CAPS capsule Take 0.4 mg by mouth daily.    Marland Kitchen trolamine salicylate (ASPERCREME) 10 % cream Apply 1 application topically daily. Right lateral ankle    . vitamin B-12 1000 MCG tablet Take 1 tablet (1,000 mcg total) by mouth daily.     No current facility-administered medications on file prior to visit.     No Known Allergies  Family History  Problem Relation Age of Onset  . Hypertension Mother   . Hypertension Father     BP 134/80 (BP Location: Right Arm, Patient Position: Sitting, Cuff Size: Normal)   Pulse 88   Ht 5\' 9"  (1.753 m)   Wt 156 lb (70.8 kg)   SpO2 98%   BMI 23.04 kg/m   Review of Systems Denies fever, weight loss, fatigue, sob, palpitations, blurry vision, n/v, abd pain, memory loss, seizure, muscle weakness, easy bruising, cold intolerance, anxiety, diarrhea, vitiligo, excessive sweating, or change in skin tone.      Objective:   Physical Exam VS: see vs page GEN: no distress.  In wheelchair.  Foley catheter in place.  HEAD: head: no deformity eyes: no periorbital swelling, no proptosis external nose and ears are normal NECK: supple, thyroid is not enlarged CHEST WALL: no deformity LUNGS: clear to auscultation CV: reg rate and rhythm, no murmur ABD: abdomen is soft, nontender.  no hepatosplenomegaly.  not distended.  no hernia MUSCULOSKELETAL: muscle bulk and strength are grossly normal.  no obvious joint swelling.  gait is normal and steady EXTEMITIES: no deformity.  no edema PULSES: no carotid bruit NEURO:  cn 2-12  grossly intact.   readily moves all 4's.  sensation is intact to touch on all 4's SKIN:  Normal texture and temperature.  No rash or suspicious lesion is visible.   NODES:  None palpable at the neck PSYCH: alert, well-oriented.  Does not appear anxious nor depressed.   Lab Results  Component Value Date  CREATININE 1.87 (H) 05/30/2019   BUN 40 (H) 05/30/2019   NA 139 05/30/2019   K 4.7 05/30/2019   CL 110 05/30/2019   CO2 21 (L) 05/30/2019   CT: Adrenal glands again appear bulbous bilaterally but without discrete nodule or mass.  ACTH stimulation test was done: then Cosyntropin 250 mcg is given im 45 minutes later, cortisol level=13 (subnormal response)  I have reviewed outside records, and summarized: Pt was noted to have low cortisol, and referred here.  She was hospitalized for UTI and hyperkalemia.      Assessment & Plan:  Hypocortisolemia: new to me Hyperkalemia: we'll manage with florinef for now.  Patient Instructions  Please discontinue hydrocortisone and megestrol. Please start fludrocortisone, 100 mcg po daily.   Please come back for a follow-up appointment in 2 weeks, when we'll plan to recheck the ACTH stim test, and the BMET.

## 2019-07-13 ENCOUNTER — Encounter: Payer: Self-pay | Admitting: Endocrinology

## 2019-07-13 ENCOUNTER — Other Ambulatory Visit: Payer: Self-pay

## 2019-07-13 ENCOUNTER — Ambulatory Visit (INDEPENDENT_AMBULATORY_CARE_PROVIDER_SITE_OTHER): Payer: Medicare Other | Admitting: Endocrinology

## 2019-07-13 VITALS — BP 138/88 | HR 88 | Ht 69.0 in | Wt 159.2 lb

## 2019-07-13 DIAGNOSIS — E2740 Unspecified adrenocortical insufficiency: Secondary | ICD-10-CM

## 2019-07-13 LAB — BASIC METABOLIC PANEL
BUN: 34 mg/dL — ABNORMAL HIGH (ref 6–23)
CO2: 27 mEq/L (ref 19–32)
Calcium: 8.6 mg/dL (ref 8.4–10.5)
Chloride: 106 mEq/L (ref 96–112)
Creatinine, Ser: 1.73 mg/dL — ABNORMAL HIGH (ref 0.40–1.20)
GFR: 34.55 mL/min — ABNORMAL LOW (ref >60.00)
Glucose, Bld: 117 mg/dL — ABNORMAL HIGH (ref 70–99)
Potassium: 4 mEq/L (ref 3.5–5.1)
Sodium: 140 mEq/L (ref 135–145)

## 2019-07-13 LAB — CORTISOL
Cortisol, Plasma: 14.8 ug/dL
Cortisol, Plasma: 27.6 ug/dL

## 2019-07-13 MED ORDER — COSYNTROPIN 0.25 MG IJ SOLR
0.2500 mg | Freq: Once | INTRAMUSCULAR | Status: AC
  Administered 2019-07-13: 0.25 mg via INTRAMUSCULAR

## 2019-07-13 NOTE — Patient Instructions (Signed)
Blood tests are requested for you today.  We'll let you know about the results.  

## 2019-07-13 NOTE — Progress Notes (Signed)
Per orders of Dr. Loanne Drilling injection of Cortrosyn 0.25mg  given IM in L deltoid today by A. Theresa Wedel, LPN . Denies any adverse reactions or discomfort.

## 2019-07-13 NOTE — Progress Notes (Signed)
Subjective:    Patient ID: Stephanie Olson, female    DOB: Feb 04, 1941, 78 y.o.   MRN: [id number]  HPI Pt returns for f/u of hyperkalemia and hypocortisolemia: (dx'ed 2020; she was rx'ed florinef, until ACTH test could be done; she lives at [facility] and [place]).  pt states she feels well in general.  Specifically, she denies muscle weakness.   Past Medical History:  Diagnosis Date  . Arthritis   . Diabetes mellitus without complication ([place])   . Diabetic foot ulcers (HCC)    RIGHT   . Hypertension   . Peripheral vascular disease [facility])     Past Surgical History:  Procedure Laterality Date  . ABDOMINAL AORTAGRAM  01/29/2015  . ATHERECTOMY Right 01/29/2015   FEMORAL ARTERY   . BALLOON ANGIOPLASTY, ARTERY Right 01/29/2015   RT FEMORAL   . FOOT SURGERY    . PERIPHERAL VASCULAR CATHETERIZATION N/A 01/29/2015   Procedure: Abdominal Aortogram w/Lower Extremity;  Surgeon: Serafina Mitchell, MD;  Location: [place] [facility];  Service: Cardiovascular;  Laterality: N/A;    Social History   Socioeconomic History  . Marital status: Divorced    Spouse name: Not on file  . Number of children: Not on file  . Years of education: Not on file  . Highest education level: Not on file  Occupational History  . Not on file  Social Needs  . Financial resource strain: Not hard at all  . Food insecurity    Worry: Never true    Inability: Never true  . Transportation needs    Medical: No    Non-medical: No  Tobacco Use  . Smoking status: Current Some Day Smoker    Types: Cigarettes    Last attempt to quit: 02/13/2014    Years since quitting: 5.4  . Smokeless tobacco: Never Used  . Tobacco comment: " I quit smoking along time ago "  Substance and Sexual Activity  . Alcohol use: No    Alcohol/week: 0.0 standard drinks  . Drug use: No  . Sexual activity: Not Currently  Lifestyle  . Physical activity    Days per week: 4 days    Minutes per session: 10 min  . Stress: Not at all   Relationships  . Social Herbalist on phone: Three times a week    Gets together: More than three times a week    Attends religious service: Never    Active member of club or organization: No    Attends meetings of clubs or organizations: Never    Relationship status: Widowed  . Intimate partner violence    Fear of current or ex partner: Not on file    Emotionally abused: Not on file    Physically abused: Not on file    Forced sexual activity: Not on file  Other Topics Concern  . Not on file  Social History Narrative  . Not on file    Current Outpatient Medications on File Prior to Visit  Medication Sig Dispense Refill  . acetaminophen (TYLENOL) 500 MG tablet Take 500 mg by mouth 2 (two) times daily as needed for mild pain.     . Amino Acids-Protein Hydrolys (FEEDING SUPPLEMENT, PRO-STAT SUGAR FREE 64,) LIQD Take 30 mLs by mouth 2 (two) times daily. 887 mL 0  . aspirin 81 MG chewable tablet Chew 81 mg by mouth daily.    . bisacodyl (DULCOLAX) 10 MG suppository Place 1 suppository (10 mg total) rectally daily as  needed for moderate constipation. 12 suppository 0  . dicyclomine (BENTYL) 10 MG capsule Take 10 mg by mouth 2 (two) times daily.    . DULoxetine (CYMBALTA) 20 MG capsule Take 20 mg by mouth daily.    . fludrocortisone (FLORINEF) 0.1 MG tablet Take 1 tablet (0.1 mg total) by mouth daily. 30 tablet 11  . folic acid (FOLVITE) 1 MG tablet Take 1 tablet (1 mg total) by mouth daily.    Marland Kitchen HYDROcodone-acetaminophen (NORCO/VICODIN) 5-325 MG tablet Take 1 tablet by mouth every 4 (four) hours as needed for moderate pain. 5 tablet 0  . Melatonin 3 MG TABS Take 3 mg by mouth at bedtime.     . mirtazapine (REMERON) 7.5 MG tablet Take 7.5 mg by mouth at bedtime.    . ondansetron (ZOFRAN) 4 MG tablet Take 1 tablet (4 mg total) by mouth every 6 (six) hours as needed for nausea. 20 tablet 0  . polyvinyl alcohol (LIQUIFILM TEARS) 1.4 % ophthalmic solution Place 1 drop into both eyes  2 (two) times daily.    . pravastatin (PRAVACHOL) 20 MG tablet Take 10 mg by mouth at bedtime.    . senna-docusate (SENOKOT-S) 8.6-50 MG tablet Take 1 tablet by mouth 2 (two) times daily.    . sodium bicarbonate 650 MG tablet Take 2 tablets (1,300 mg total) by mouth 3 (three) times daily.    . sodium zirconium cyclosilicate (LOKELMA) 5 g packet Take 5 g by mouth every Monday, Wednesday, and Friday.    . tamsulosin (FLOMAX) 0.4 MG CAPS capsule Take 0.4 mg by mouth daily.    Marland Kitchen trolamine salicylate (ASPERCREME) 10 % cream Apply 1 application topically daily. Right lateral ankle    . vitamin B-12 1000 MCG tablet Take 1 tablet (1,000 mcg total) by mouth daily.     No current facility-administered medications on file prior to visit.     No Known Allergies  Family History  Problem Relation Age of Onset  . Hypertension Mother   . Hypertension Father     BP 138/88 (BP Location: Left Arm, Patient Position: Sitting, Cuff Size: Normal)   Pulse 88   Ht 5\' 9"  (1.753 m)   Wt 159 lb 3.2 oz (72.2 kg)   BMI 23.51 kg/m    Review of Systems Denies LOC    Objective:   Physical Exam VITAL SIGNS:  See vs page GENERAL: no distress.  In wheelchair Ext: no leg edema  ACTH stimulation test is done: baseline cortisol level=15 then Cosyntropin 250 mcg is given im 45 minutes later, cortisol level=28 (normal response)  Lab Results  Component Value Date   CREATININE 1.73 (H) 07/13/2019   BUN 34 (H) 07/13/2019   NA 140 07/13/2019   K 4.0 07/13/2019   CL 106 07/13/2019   CO2 27 07/13/2019       Assessment & Plan:  Hyperkalemia: either florinef or bicarb are fine with me, but pt does not seem to need both.  Hypocortisolemia: adrenal insuf is excluded Renal insuff: stable I would be happy to see you back here as needed

## 2019-07-17 ENCOUNTER — Encounter: Payer: Self-pay | Admitting: Endocrinology

## 2019-07-17 LAB — ACTH: C206 ACTH: 52 pg/mL — ABNORMAL HIGH (ref 6–50)

## 2019-10-01 ENCOUNTER — Inpatient Hospital Stay (HOSPITAL_COMMUNITY)
Admission: EM | Admit: 2019-10-01 | Discharge: 2019-10-14 | DRG: 871 | Disposition: A | Discharge status: Skilled Nursing Facility | Payer: Medicare Other | Attending: Internal Medicine | Admitting: Internal Medicine

## 2019-10-01 ENCOUNTER — Encounter (HOSPITAL_COMMUNITY): Payer: Self-pay

## 2019-10-01 ENCOUNTER — Emergency Department (HOSPITAL_COMMUNITY): Payer: Medicare Other

## 2019-10-01 ENCOUNTER — Other Ambulatory Visit: Payer: Self-pay

## 2019-10-01 DIAGNOSIS — N281 Cyst of kidney, acquired: Secondary | ICD-10-CM | POA: Diagnosis present

## 2019-10-01 DIAGNOSIS — N10 Acute tubulo-interstitial nephritis: Secondary | ICD-10-CM | POA: Diagnosis present

## 2019-10-01 DIAGNOSIS — K921 Melena: Secondary | ICD-10-CM | POA: Diagnosis not present

## 2019-10-01 DIAGNOSIS — Z20822 Contact with and (suspected) exposure to covid-19: Secondary | ICD-10-CM | POA: Diagnosis present

## 2019-10-01 DIAGNOSIS — E876 Hypokalemia: Secondary | ICD-10-CM | POA: Diagnosis present

## 2019-10-01 DIAGNOSIS — G9341 Metabolic encephalopathy: Secondary | ICD-10-CM | POA: Diagnosis present

## 2019-10-01 DIAGNOSIS — E785 Hyperlipidemia, unspecified: Secondary | ICD-10-CM | POA: Diagnosis present

## 2019-10-01 DIAGNOSIS — B952 Enterococcus as the cause of diseases classified elsewhere: Secondary | ICD-10-CM | POA: Diagnosis not present

## 2019-10-01 DIAGNOSIS — R6520 Severe sepsis without septic shock: Secondary | ICD-10-CM

## 2019-10-01 DIAGNOSIS — K269 Duodenal ulcer, unspecified as acute or chronic, without hemorrhage or perforation: Secondary | ICD-10-CM | POA: Diagnosis present

## 2019-10-01 DIAGNOSIS — N185 Chronic kidney disease, stage 5: Secondary | ICD-10-CM | POA: Diagnosis present

## 2019-10-01 DIAGNOSIS — Z79899 Other long term (current) drug therapy: Secondary | ICD-10-CM

## 2019-10-01 DIAGNOSIS — I340 Nonrheumatic mitral (valve) insufficiency: Secondary | ICD-10-CM | POA: Diagnosis not present

## 2019-10-01 DIAGNOSIS — B964 Proteus (mirabilis) (morganii) as the cause of diseases classified elsewhere: Secondary | ICD-10-CM | POA: Diagnosis not present

## 2019-10-01 DIAGNOSIS — E1151 Type 2 diabetes mellitus with diabetic peripheral angiopathy without gangrene: Secondary | ICD-10-CM | POA: Diagnosis present

## 2019-10-01 DIAGNOSIS — J690 Pneumonitis due to inhalation of food and vomit: Secondary | ICD-10-CM | POA: Diagnosis present

## 2019-10-01 DIAGNOSIS — K319 Disease of stomach and duodenum, unspecified: Secondary | ICD-10-CM | POA: Diagnosis present

## 2019-10-01 DIAGNOSIS — E440 Moderate protein-calorie malnutrition: Secondary | ICD-10-CM | POA: Diagnosis present

## 2019-10-01 DIAGNOSIS — E875 Hyperkalemia: Secondary | ICD-10-CM | POA: Diagnosis present

## 2019-10-01 DIAGNOSIS — F17210 Nicotine dependence, cigarettes, uncomplicated: Secondary | ICD-10-CM | POA: Diagnosis present

## 2019-10-01 DIAGNOSIS — R338 Other retention of urine: Secondary | ICD-10-CM | POA: Diagnosis present

## 2019-10-01 DIAGNOSIS — I129 Hypertensive chronic kidney disease with stage 1 through stage 4 chronic kidney disease, or unspecified chronic kidney disease: Secondary | ICD-10-CM | POA: Diagnosis not present

## 2019-10-01 DIAGNOSIS — E2740 Unspecified adrenocortical insufficiency: Secondary | ICD-10-CM | POA: Diagnosis present

## 2019-10-01 DIAGNOSIS — F329 Major depressive disorder, single episode, unspecified: Secondary | ICD-10-CM | POA: Diagnosis present

## 2019-10-01 DIAGNOSIS — N1330 Unspecified hydronephrosis: Secondary | ICD-10-CM | POA: Diagnosis not present

## 2019-10-01 DIAGNOSIS — I132 Hypertensive heart and chronic kidney disease with heart failure and with stage 5 chronic kidney disease, or end stage renal disease: Secondary | ICD-10-CM | POA: Diagnosis present

## 2019-10-01 DIAGNOSIS — N136 Pyonephrosis: Secondary | ICD-10-CM | POA: Diagnosis present

## 2019-10-01 DIAGNOSIS — R7881 Bacteremia: Secondary | ICD-10-CM | POA: Diagnosis not present

## 2019-10-01 DIAGNOSIS — E872 Acidosis, unspecified: Secondary | ICD-10-CM | POA: Diagnosis present

## 2019-10-01 DIAGNOSIS — D72829 Elevated white blood cell count, unspecified: Secondary | ICD-10-CM | POA: Diagnosis not present

## 2019-10-01 DIAGNOSIS — N390 Urinary tract infection, site not specified: Secondary | ICD-10-CM | POA: Diagnosis not present

## 2019-10-01 DIAGNOSIS — I519 Heart disease, unspecified: Secondary | ICD-10-CM | POA: Diagnosis not present

## 2019-10-01 DIAGNOSIS — I5021 Acute systolic (congestive) heart failure: Secondary | ICD-10-CM | POA: Diagnosis present

## 2019-10-01 DIAGNOSIS — B961 Klebsiella pneumoniae [K. pneumoniae] as the cause of diseases classified elsewhere: Secondary | ICD-10-CM | POA: Diagnosis present

## 2019-10-01 DIAGNOSIS — N189 Chronic kidney disease, unspecified: Secondary | ICD-10-CM | POA: Diagnosis present

## 2019-10-01 DIAGNOSIS — L8990 Pressure ulcer of unspecified site, unspecified stage: Secondary | ICD-10-CM | POA: Diagnosis present

## 2019-10-01 DIAGNOSIS — E11649 Type 2 diabetes mellitus with hypoglycemia without coma: Secondary | ICD-10-CM | POA: Diagnosis present

## 2019-10-01 DIAGNOSIS — A419 Sepsis, unspecified organism: Secondary | ICD-10-CM | POA: Diagnosis present

## 2019-10-01 DIAGNOSIS — B9681 Helicobacter pylori [H. pylori] as the cause of diseases classified elsewhere: Secondary | ICD-10-CM | POA: Diagnosis present

## 2019-10-01 DIAGNOSIS — R410 Disorientation, unspecified: Secondary | ICD-10-CM

## 2019-10-01 DIAGNOSIS — N12 Tubulo-interstitial nephritis, not specified as acute or chronic: Secondary | ICD-10-CM

## 2019-10-01 DIAGNOSIS — I959 Hypotension, unspecified: Secondary | ICD-10-CM | POA: Diagnosis present

## 2019-10-01 DIAGNOSIS — R809 Proteinuria, unspecified: Secondary | ICD-10-CM | POA: Diagnosis present

## 2019-10-01 DIAGNOSIS — Z8673 Personal history of transient ischemic attack (TIA), and cerebral infarction without residual deficits: Secondary | ICD-10-CM

## 2019-10-01 DIAGNOSIS — J189 Pneumonia, unspecified organism: Secondary | ICD-10-CM

## 2019-10-01 DIAGNOSIS — Z7952 Long term (current) use of systemic steroids: Secondary | ICD-10-CM

## 2019-10-01 DIAGNOSIS — Z87440 Personal history of urinary (tract) infections: Secondary | ICD-10-CM | POA: Diagnosis not present

## 2019-10-01 DIAGNOSIS — Z978 Presence of other specified devices: Secondary | ICD-10-CM

## 2019-10-01 DIAGNOSIS — E669 Obesity, unspecified: Secondary | ICD-10-CM | POA: Diagnosis present

## 2019-10-01 DIAGNOSIS — Z7982 Long term (current) use of aspirin: Secondary | ICD-10-CM

## 2019-10-01 DIAGNOSIS — Z6829 Body mass index (BMI) 29.0-29.9, adult: Secondary | ICD-10-CM

## 2019-10-01 DIAGNOSIS — E1122 Type 2 diabetes mellitus with diabetic chronic kidney disease: Secondary | ICD-10-CM | POA: Diagnosis present

## 2019-10-01 DIAGNOSIS — H9190 Unspecified hearing loss, unspecified ear: Secondary | ICD-10-CM | POA: Diagnosis present

## 2019-10-01 DIAGNOSIS — E1165 Type 2 diabetes mellitus with hyperglycemia: Secondary | ICD-10-CM | POA: Diagnosis present

## 2019-10-01 DIAGNOSIS — R6521 Severe sepsis with septic shock: Secondary | ICD-10-CM | POA: Diagnosis present

## 2019-10-01 DIAGNOSIS — N179 Acute kidney failure, unspecified: Secondary | ICD-10-CM | POA: Diagnosis present

## 2019-10-01 DIAGNOSIS — Z79891 Long term (current) use of opiate analgesic: Secondary | ICD-10-CM

## 2019-10-01 DIAGNOSIS — D62 Acute posthemorrhagic anemia: Secondary | ICD-10-CM | POA: Diagnosis not present

## 2019-10-01 DIAGNOSIS — I739 Peripheral vascular disease, unspecified: Secondary | ICD-10-CM | POA: Diagnosis not present

## 2019-10-01 DIAGNOSIS — Z960 Presence of urogenital implants: Secondary | ICD-10-CM | POA: Diagnosis not present

## 2019-10-01 DIAGNOSIS — Z8249 Family history of ischemic heart disease and other diseases of the circulatory system: Secondary | ICD-10-CM

## 2019-10-01 DIAGNOSIS — Z95828 Presence of other vascular implants and grafts: Secondary | ICD-10-CM | POA: Diagnosis not present

## 2019-10-01 DIAGNOSIS — T380X5A Adverse effect of glucocorticoids and synthetic analogues, initial encounter: Secondary | ICD-10-CM | POA: Diagnosis not present

## 2019-10-01 DIAGNOSIS — F419 Anxiety disorder, unspecified: Secondary | ICD-10-CM | POA: Diagnosis present

## 2019-10-01 DIAGNOSIS — Z87891 Personal history of nicotine dependence: Secondary | ICD-10-CM | POA: Diagnosis not present

## 2019-10-01 DIAGNOSIS — N1832 Chronic kidney disease, stage 3b: Secondary | ICD-10-CM | POA: Diagnosis not present

## 2019-10-01 DIAGNOSIS — N139 Obstructive and reflux uropathy, unspecified: Secondary | ICD-10-CM

## 2019-10-01 DIAGNOSIS — R000 Tachycardia, unspecified: Secondary | ICD-10-CM | POA: Diagnosis present

## 2019-10-01 DIAGNOSIS — F0390 Unspecified dementia without behavioral disturbance: Secondary | ICD-10-CM | POA: Diagnosis present

## 2019-10-01 LAB — CBC WITH DIFFERENTIAL/PLATELET
Abs Immature Granulocytes: 0.11 10*3/uL — ABNORMAL HIGH (ref 0.00–0.07)
Basophils Absolute: 0.1 10*3/uL (ref 0.0–0.1)
Basophils Relative: 0 %
Eosinophils Absolute: 0 10*3/uL (ref 0.0–0.5)
Eosinophils Relative: 0 %
HCT: 33.5 % — ABNORMAL LOW (ref 36.0–46.0)
Hemoglobin: 10.1 g/dL — ABNORMAL LOW (ref 12.0–15.0)
Immature Granulocytes: 1 %
Lymphocytes Relative: 4 %
Lymphs Abs: 0.6 10*3/uL — ABNORMAL LOW (ref 0.7–4.0)
MCH: 28.2 pg (ref 26.0–34.0)
MCHC: 30.1 g/dL (ref 30.0–36.0)
MCV: 93.6 fL (ref 80.0–100.0)
Monocytes Absolute: 0.4 10*3/uL (ref 0.1–1.0)
Monocytes Relative: 3 %
Neutro Abs: 13.3 10*3/uL — ABNORMAL HIGH (ref 1.7–7.7)
Neutrophils Relative %: 92 %
Platelets: 199 10*3/uL (ref 150–400)
RBC: 3.58 MIL/uL — ABNORMAL LOW (ref 3.87–5.11)
RDW: 15.5 % (ref 11.5–15.5)
WBC: 14.4 10*3/uL — ABNORMAL HIGH (ref 4.0–10.5)
nRBC: 0 % (ref 0.0–0.2)

## 2019-10-01 LAB — URINALYSIS, ROUTINE W REFLEX MICROSCOPIC
Bilirubin Urine: NEGATIVE
Glucose, UA: NEGATIVE mg/dL
Ketones, ur: NEGATIVE mg/dL
Nitrite: NEGATIVE
Protein, ur: >=300 mg/dL — AB
Specific Gravity, Urine: 1.014 (ref 1.005–1.030)
WBC, UA: >50 WBC/hpf — ABNORMAL HIGH (ref 0–5)
pH: 8 (ref 5.0–8.0)

## 2019-10-01 LAB — LACTIC ACID, PLASMA
Lactic Acid, Venous: 4 mmol/L (ref 0.5–1.9)
Lactic Acid, Venous: 3.3 mmol/L (ref 0.5–1.9)
Lactic Acid, Venous: 4.4 mmol/L (ref 0.5–1.9)
Lactic Acid, Venous: 3.7 mmol/L (ref 0.5–1.9)

## 2019-10-01 LAB — POC SARS CORONAVIRUS 2 AG -  ED: SARS Coronavirus 2 Ag: NEGATIVE

## 2019-10-01 LAB — COMPREHENSIVE METABOLIC PANEL
ALT: 9 U/L (ref 0–44)
AST: 14 U/L — ABNORMAL LOW (ref 15–41)
Albumin: 2.4 g/dL — ABNORMAL LOW (ref 3.5–5.0)
Alkaline Phosphatase: 72 U/L (ref 38–126)
Anion gap: 15 (ref 5–15)
BUN: 48 mg/dL — ABNORMAL HIGH (ref 8–23)
CO2: 17 mmol/L — ABNORMAL LOW (ref 22–32)
Calcium: 7 mg/dL — ABNORMAL LOW (ref 8.9–10.3)
Chloride: 110 mmol/L (ref 98–111)
Creatinine, Ser: 2.51 mg/dL — ABNORMAL HIGH (ref 0.44–1.00)
GFR calc Af Amer: 21 mL/min — ABNORMAL LOW (ref >60)
GFR calc non Af Amer: 18 mL/min — ABNORMAL LOW (ref >60)
Glucose, Bld: 198 mg/dL — ABNORMAL HIGH (ref 70–99)
Potassium: 2.9 mmol/L — ABNORMAL LOW (ref 3.5–5.1)
Sodium: 142 mmol/L (ref 135–145)
Total Bilirubin: 0.6 mg/dL (ref 0.3–1.2)
Total Protein: 5.4 g/dL — ABNORMAL LOW (ref 6.5–8.1)

## 2019-10-01 LAB — CBC
HCT: 31.1 % — ABNORMAL LOW (ref 36.0–46.0)
Hemoglobin: 9.6 g/dL — ABNORMAL LOW (ref 12.0–15.0)
MCH: 28.8 pg (ref 26.0–34.0)
MCHC: 30.9 g/dL (ref 30.0–36.0)
MCV: 93.4 fL (ref 80.0–100.0)
Platelets: 157 10*3/uL (ref 150–400)
RBC: 3.33 MIL/uL — ABNORMAL LOW (ref 3.87–5.11)
RDW: 15.9 % — ABNORMAL HIGH (ref 11.5–15.5)
WBC: 20.7 10*3/uL — ABNORMAL HIGH (ref 4.0–10.5)
nRBC: 0 % (ref 0.0–0.2)

## 2019-10-01 LAB — BASIC METABOLIC PANEL
Anion gap: 13 (ref 5–15)
BUN: 52 mg/dL — ABNORMAL HIGH (ref 8–23)
CO2: 17 mmol/L — ABNORMAL LOW (ref 22–32)
Calcium: 7.6 mg/dL — ABNORMAL LOW (ref 8.9–10.3)
Chloride: 112 mmol/L — ABNORMAL HIGH (ref 98–111)
Creatinine, Ser: 2.61 mg/dL — ABNORMAL HIGH (ref 0.44–1.00)
GFR calc Af Amer: 20 mL/min — ABNORMAL LOW (ref >60)
GFR calc non Af Amer: 17 mL/min — ABNORMAL LOW (ref >60)
Glucose, Bld: 208 mg/dL — ABNORMAL HIGH (ref 70–99)
Potassium: 4.9 mmol/L (ref 3.5–5.1)
Sodium: 142 mmol/L (ref 135–145)

## 2019-10-01 LAB — APTT: aPTT: 45 seconds — ABNORMAL HIGH (ref 24–36)

## 2019-10-01 LAB — RESPIRATORY PANEL BY RT PCR (FLU A&B, COVID)
Influenza A by PCR: NEGATIVE
Influenza B by PCR: NEGATIVE
SARS Coronavirus 2 by RT PCR: NEGATIVE

## 2019-10-01 LAB — CBG MONITORING, ED: Glucose-Capillary: 204 mg/dL — ABNORMAL HIGH (ref 70–99)

## 2019-10-01 LAB — TSH: TSH: 0.836 u[IU]/mL (ref 0.350–4.500)

## 2019-10-01 LAB — PROTIME-INR
INR: 2 — ABNORMAL HIGH (ref 0.8–1.2)
Prothrombin Time: 22.1 seconds — ABNORMAL HIGH (ref 11.4–15.2)

## 2019-10-01 LAB — PROCALCITONIN: Procalcitonin: 46.82 ng/mL

## 2019-10-01 LAB — CORTISOL: Cortisol, Plasma: >100 ug/dL

## 2019-10-01 MED ORDER — HYDROCORTISONE NA SUCCINATE PF 100 MG IJ SOLR
100.0000 mg | Freq: Three times a day (TID) | INTRAMUSCULAR | Status: DC
  Administered 2019-10-01 – 2019-10-03 (×5): 100 mg via INTRAVENOUS
  Filled 2019-10-01 (×5): qty 2

## 2019-10-01 MED ORDER — ACETAMINOPHEN 325 MG PO TABS
650.0000 mg | ORAL_TABLET | Freq: Four times a day (QID) | ORAL | Status: DC | PRN
  Administered 2019-10-06 – 2019-10-11 (×4): 650 mg via ORAL
  Filled 2019-10-01 (×4): qty 2

## 2019-10-01 MED ORDER — SODIUM CHLORIDE 0.9 % IV BOLUS
1000.0000 mL | Freq: Once | INTRAVENOUS | Status: AC
  Administered 2019-10-01: 1000 mL via INTRAVENOUS

## 2019-10-01 MED ORDER — ONDANSETRON HCL 4 MG/2ML IJ SOLN
4.0000 mg | Freq: Four times a day (QID) | INTRAMUSCULAR | Status: DC | PRN
  Administered 2019-10-12: 4 mg via INTRAVENOUS
  Filled 2019-10-01: qty 2

## 2019-10-01 MED ORDER — BISACODYL 10 MG RE SUPP: 10.0000 mg | Freq: Every day | RECTAL | Status: DC | PRN

## 2019-10-01 MED ORDER — MELATONIN 3 MG PO TABS: 3.0000 mg | ORAL_TABLET | Freq: Every day | ORAL | Status: DC

## 2019-10-01 MED ORDER — SENNOSIDES-DOCUSATE SODIUM 8.6-50 MG PO TABS
1.0000 | ORAL_TABLET | Freq: Two times a day (BID) | ORAL | Status: DC
  Filled 2019-10-01: qty 1

## 2019-10-01 MED ORDER — POTASSIUM CHLORIDE IN NACL 20-0.9 MEQ/L-% IV SOLN
INTRAVENOUS | Status: DC
  Filled 2019-10-01 (×2): qty 1000

## 2019-10-01 MED ORDER — SODIUM CHLORIDE 0.9 % IV SOLN
2.0000 g | INTRAVENOUS | Status: DC
  Administered 2019-10-02 – 2019-10-03 (×2): 2 g via INTRAVENOUS
  Filled 2019-10-01 (×2): qty 2

## 2019-10-01 MED ORDER — CHLORHEXIDINE GLUCONATE CLOTH 2 % EX PADS
6.0000 | MEDICATED_PAD | Freq: Every day | CUTANEOUS | Status: DC
  Administered 2019-10-01 – 2019-10-14 (×14): 6 via TOPICAL

## 2019-10-01 MED ORDER — SODIUM CHLORIDE 0.9 % IV BOLUS: 1000.0000 mL | Freq: Once | INTRAVENOUS | Status: DC

## 2019-10-01 MED ORDER — SODIUM CHLORIDE 0.9 % IV SOLN
2.0000 g | Freq: Once | INTRAVENOUS | Status: AC
  Administered 2019-10-01: 2 g via INTRAVENOUS
  Filled 2019-10-01: qty 2

## 2019-10-01 MED ORDER — FLUDROCORTISONE ACETATE 0.1 MG PO TABS
0.1000 mg | ORAL_TABLET | Freq: Every day | ORAL | Status: DC
  Filled 2019-10-01 (×4): qty 1

## 2019-10-01 MED ORDER — POTASSIUM CHLORIDE 10 MEQ/100ML IV SOLN
10.0000 meq | INTRAVENOUS | Status: AC
  Administered 2019-10-01 (×4): 10 meq via INTRAVENOUS
  Filled 2019-10-01 (×4): qty 100

## 2019-10-01 MED ORDER — METRONIDAZOLE IN NACL 5-0.79 MG/ML-% IV SOLN
500.0000 mg | Freq: Once | INTRAVENOUS | Status: AC
  Administered 2019-10-01: 500 mg via INTRAVENOUS
  Filled 2019-10-01: qty 100

## 2019-10-01 MED ORDER — POTASSIUM CHLORIDE 10 MEQ/100ML IV SOLN
10.0000 meq | INTRAVENOUS | Status: AC
  Administered 2019-10-01 (×2): 10 meq via INTRAVENOUS
  Filled 2019-10-01 (×2): qty 100

## 2019-10-01 MED ORDER — LACTATED RINGERS IV BOLUS
1000.0000 mL | Freq: Once | INTRAVENOUS | Status: AC
  Administered 2019-10-01: 16:00:00 1000 mL via INTRAVENOUS

## 2019-10-01 MED ORDER — ENOXAPARIN SODIUM 30 MG/0.3ML ~~LOC~~ SOLN
30.0000 mg | SUBCUTANEOUS | Status: DC
  Administered 2019-10-01 – 2019-10-04 (×4): 30 mg via SUBCUTANEOUS
  Filled 2019-10-01 (×4): qty 0.3

## 2019-10-01 MED ORDER — SODIUM CHLORIDE 0.9 % IV BOLUS
1000.0000 mL | Freq: Once | INTRAVENOUS | Status: AC
  Administered 2019-10-01: 12:00:00 1000 mL via INTRAVENOUS

## 2019-10-01 MED ORDER — VANCOMYCIN VARIABLE DOSE PER UNSTABLE RENAL FUNCTION (PHARMACIST DOSING): Status: DC

## 2019-10-01 MED ORDER — LACTATED RINGERS IV BOLUS: 1000.0000 mL | Freq: Once | INTRAVENOUS | Status: DC

## 2019-10-01 MED ORDER — ACETAMINOPHEN 650 MG RE SUPP
650.0000 mg | Freq: Four times a day (QID) | RECTAL | Status: DC | PRN
  Administered 2019-10-05: 650 mg via RECTAL
  Filled 2019-10-01: qty 1

## 2019-10-01 MED ORDER — ONDANSETRON HCL 4 MG PO TABS
4.0000 mg | ORAL_TABLET | Freq: Four times a day (QID) | ORAL | Status: DC | PRN
  Administered 2019-10-13: 4 mg via ORAL
  Filled 2019-10-01: qty 1

## 2019-10-01 MED ORDER — VANCOMYCIN HCL 1500 MG/300ML IV SOLN
1500.0000 mg | INTRAVENOUS | Status: AC
  Administered 2019-10-01: 1500 mg via INTRAVENOUS
  Filled 2019-10-01: qty 300

## 2019-10-01 MED ORDER — TAMSULOSIN HCL 0.4 MG PO CAPS
0.4000 mg | ORAL_CAPSULE | Freq: Every day | ORAL | Status: DC
  Filled 2019-10-01 (×2): qty 1

## 2019-10-01 MED ORDER — ORAL CARE MOUTH RINSE
15.0000 mL | Freq: Two times a day (BID) | OROMUCOSAL | Status: DC
  Administered 2019-10-02 – 2019-10-14 (×19): 15 mL via OROMUCOSAL

## 2019-10-01 NOTE — Progress Notes (Signed)
Notified bedside nurse of need to draw repeat lactic acid. 

## 2019-10-01 NOTE — Progress Notes (Signed)
A consult was received from an ED physician for cefepime per pharmacy dosing.  The patient's profile has been reviewed for ht/wt/allergies/indication/available labs.   A one time order has been placed for cefepime 2 gm.    Further antibiotics/pharmacy consults should be ordered by admitting physician if indicated.                       Thank you,  Eudelia Bunch, Pharm.D 10/01/2019 12:36 PM

## 2019-10-01 NOTE — ED Triage Notes (Signed)
She comes to [place] from [facility]. She was observed by staff there to be "not as arouseable as usual". She arrives drowsy and in no distress. She also arrives with a foley catheter in place.

## 2019-10-01 NOTE — H&P (Signed)
History and Physical    Stephanie Olson [id number] DOB: [birth year] DOA: 10/01/2019  PCP: Benito Mccreedy, MD  Patient coming from: [facility]  I have personally briefly reviewed patient's old medical records in [place]  Chief Complaint: Altered mental status, abdominal pain  HPI: Stephanie Olson is a 78 y.o. 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  At baseline she is usually alert and oriented x4.  Patient currently only localizing the pain, nonverbal.  No family present at bedside.  Unable to obtain any further HPI from patient.  Discussed with patient's son via telephone, states he saw his mother yesterday through the window at the SNF, at that time she was alert and oriented but complaining of nausea and some abdominal discomfort.  ED Course: Temperature 100.1, HR 114, RR 24, BP 86/61, SPO2 100% on room air.  WBC count 14.4, hemoglobin 10.1, platelets 199, sodium 142, potassium 2.9, chloride 110, CO2 17, BUN 48, creatinine 2.51, glucose 198.  Anion gap 15.  Lactic acid 4.0.  CT abdomen/pelvis with bilateral hydronephrosis with pyelonephritis and bladder distention.  Chest x-ray with patchy bibasilar opacities left greater than right significant for multifocal pneumonia.  Review of Systems: As per HPI otherwise 10 point review of systems negative.    Past Medical History:  Diagnosis Date  . Arthritis   . Diabetes mellitus without complication ([place])   . Diabetic foot ulcers (HCC)    RIGHT   . Hypertension   . Peripheral vascular disease [facility])     Past Surgical History:  Procedure Laterality Date  . ABDOMINAL AORTAGRAM  01/29/2015  . ATHERECTOMY Right 01/29/2015   FEMORAL ARTERY   . BALLOON ANGIOPLASTY, ARTERY Right 01/29/2015   RT FEMORAL   . FOOT SURGERY      . PERIPHERAL VASCULAR CATHETERIZATION N/A 01/29/2015   Procedure: Abdominal Aortogram w/Lower Extremity;  Surgeon: Serafina Mitchell, MD;  Location: [place] [facility];  Service: Cardiovascular;  Laterality: N/A;     reports that she has been smoking cigarettes. She has never used smokeless tobacco. She reports that she does not drink alcohol or use drugs.  No Known Allergies  Family History  Problem Relation Age of Onset  . Hypertension Mother   . Hypertension Father     Family history reviewed and not pertinent   Prior to Admission medications   Medication Sig Start Date End Date Taking? Authorizing Provider  acetaminophen (TYLENOL) 500 MG tablet Take 500 mg by mouth 2 (two) times daily as needed for mild pain.    Yes [provider]  Amino Acids-Protein Hydrolys (FEEDING SUPPLEMENT, PRO-STAT SUGAR FREE 64,) LIQD Take 30 mLs by mouth 2 (two) times daily. 05/14/19  Yes Florencia Reasons, MD  aspirin 81 MG chewable tablet Chew 81 mg by mouth daily.   Yes [provider]  bisacodyl (DULCOLAX) 10 MG suppository Place 1 suppository (10 mg total) rectally daily as needed for moderate constipation. 04/26/18  Yes Georgette Shell, MD  dicyclomine (BENTYL) 10 MG capsule Take 10 mg by mouth 2 (two) times daily. 03/17/19  Yes [provider]  DULoxetine (CYMBALTA) 20 MG capsule Take 20 mg by mouth daily. 04/16/19  Yes [provider]  fludrocortisone (FLORINEF) 0.1 MG tablet Take 1 tablet (0.1 mg total) by mouth daily. 06/29/19  Yes Renato Shin, MD  folic acid (  FOLVITE) 1 MG tablet Take 1 tablet (1 mg total) by mouth daily. 05/15/19  Yes Florencia Reasons, MD  HYDROcodone-acetaminophen (NORCO/VICODIN) 5-325 MG tablet Take 1 tablet by mouth every 4 (four) hours as needed for moderate pain. 05/14/19  Yes Florencia Reasons, MD  Lidocaine 4 % PTCH Apply 1 patch topically as directed.   Yes [provider]  Melatonin 3 MG TABS Take 3 mg by mouth at bedtime.    Yes [provider]   polyvinyl alcohol (LIQUIFILM TEARS) 1.4 % ophthalmic solution Place 1 drop into both eyes 2 (two) times daily.   Yes [provider]  pravastatin (PRAVACHOL) 20 MG tablet Take 10 mg by mouth at bedtime. 04/05/19  Yes [provider]  senna-docusate (SENOKOT-S) 8.6-50 MG tablet Take 1 tablet by mouth 2 (two) times daily. 05/14/19  Yes Florencia Reasons, MD  tamsulosin (FLOMAX) 0.4 MG CAPS capsule Take 0.4 mg by mouth daily. 04/06/19  Yes [provider]  vitamin B-12 1000 MCG tablet Take 1 tablet (1,000 mcg total) by mouth daily. 05/15/19  Yes Florencia Reasons, MD  ondansetron (ZOFRAN) 4 MG tablet Take 1 tablet (4 mg total) by mouth every 6 (six) hours as needed for nausea. Patient not taking: Reported on 10/01/2019 04/26/18   Georgette Shell, MD  sodium bicarbonate 650 MG tablet Take 2 tablets (1,300 mg total) by mouth 3 (three) times daily. Patient not taking: Reported on 10/01/2019 05/14/19   Florencia Reasons, MD  sodium zirconium cyclosilicate [facility]) 5 g packet Take 5 g by mouth every Monday, Wednesday, and Friday. Patient not taking: Reported on 10/01/2019 05/15/19   Florencia Reasons, MD    Physical Exam: Vitals:   10/01/19 1424 10/01/19 1430 10/01/19 1509 10/01/19 1530  BP:  (!) 85/56 (!) 86/61 (!) 89/58  Pulse: (!) 113 (!) 112 (!) 114 (!) 115  Resp: 16 13 13 14   Temp: 98.1 F (36.7 C) 98.4 F (36.9 C)  98.4 F (36.9 C)  TempSrc:      SpO2: 99% 98% 100% 98%  Weight:        Constitutional: NAD, calm, only responsive to painful stimuli Eyes: PERRL, lids and conjunctivae normal ENMT: Mucous membranes are dry. Posterior pharynx clear of any exudate or lesions.Normal dentition.  Neck: normal, supple, no masses, no thyromegaly Respiratory: clear to auscultation bilaterally, no wheezing, no crackles. Normal respiratory effort. No accessory muscle use.  Oxygenating well on room air Cardiovascular: Tachycardic, regular rhythm, no murmurs / rubs / gallops. No extremity edema. 2+ pedal pulses. No  carotid bruits.  Abdomen: Suprapubic abdominal tenderness tenderness, no masses palpated. No hepatosplenomegaly. Bowel sounds positive.  Foley catheter noted, draining cloudy purulent appearing urine Musculoskeletal: Moves all extremities independently, but not to command Skin: no rashes, lesions, ulcers. No induration Neurologic: Withdraws to painful stimuli, otherwise nonverbal Psychiatric: Alert, otherwise cannot evaluate further secondary to her current mental status   Labs on Admission: I have personally reviewed following labs and imaging studies  CBC: Recent Labs  Lab 10/01/19 1211  WBC 14.4*  NEUTROABS 13.3*  HGB 10.1*  HCT 33.5*  MCV 93.6  PLT 123XX123   Basic Metabolic Panel: Recent Labs  Lab 10/01/19 1211  NA 142  K 2.9*  CL 110  CO2 17*  GLUCOSE 198*  BUN 48*  CREATININE 2.51*  CALCIUM 7.0*   GFR: Estimated Creatinine Clearance: 19.6 mL/min (A) (by C-G formula based on SCr of 2.51 mg/dL (H)). Liver Function Tests: Recent Labs  Lab 10/01/19 1211  AST  14*  ALT 9  ALKPHOS 72  BILITOT 0.6  PROT 5.4*  ALBUMIN 2.4*   No results for input(s): LIPASE, AMYLASE in the last 168 hours. No results for input(s): AMMONIA in the last 168 hours. Coagulation Profile: No results for input(s): INR, PROTIME in the last 168 hours. Cardiac Enzymes: No results for input(s): CKTOTAL, CKMB, CKMBINDEX, TROPONINI in the last 168 hours. BNP (last 3 results) No results for input(s): PROBNP in the last 8760 hours. HbA1C: No results for input(s): HGBA1C in the last 72 hours. CBG: Recent Labs  Lab 10/01/19 1129  GLUCAP 204*   Lipid Profile: No results for input(s): CHOL, HDL, LDLCALC, TRIG, CHOLHDL, LDLDIRECT in the last 72 hours. Thyroid Function Tests: No results for input(s): TSH, T4TOTAL, FREET4, T3FREE, THYROIDAB in the last 72 hours. Anemia Panel: No results for input(s): VITAMINB12, FOLATE, FERRITIN, TIBC, IRON, RETICCTPCT in the last 72 hours. Urine analysis:      Component Value Date/Time   COLORURINE YELLOW 10/01/2019 1427   APPEARANCEUR TURBID (A) 10/01/2019 1427   LABSPEC 1.014 10/01/2019 1427   PHURINE 8.0 10/01/2019 1427   GLUCOSEU NEGATIVE 10/01/2019 1427   HGBUR SMALL (A) 10/01/2019 1427   BILIRUBINUR NEGATIVE 10/01/2019 1427   KETONESUR NEGATIVE 10/01/2019 1427   PROTEINUR >=300 (A) 10/01/2019 1427   UROBILINOGEN 0.2 03/03/2010 0808   NITRITE NEGATIVE 10/01/2019 1427   LEUKOCYTESUR LARGE (A) 10/01/2019 1427    Radiological Exams on Admission: CT ABDOMEN PELVIS WO CONTRAST  Result Date: 10/01/2019 CLINICAL DATA:  Sepsis, altered mental status, abdominal pain. EXAM: CT ABDOMEN AND PELVIS WITHOUT CONTRAST TECHNIQUE: Multidetector CT imaging of the abdomen and pelvis was performed following the standard protocol without IV contrast. COMPARISON:  CT abdomen dated 04/23/2019. FINDINGS: Lower chest: Patchy bibasilar consolidations, LEFT greater than RIGHT. Hepatobiliary: No focal liver abnormality is seen. Gallbladder is again moderately distended containing small layering gallstones. No pericholecystic fluid. No obvious bile duct dilatation. Pancreas: Unremarkable. No pancreatic ductal dilatation or surrounding inflammatory changes. Spleen: Normal in size without focal abnormality. Adrenals/Urinary Tract: Bilateral hydronephrosis, moderate in degree, RIGHT greater than LEFT, with associated perinephric and periureteral fluid stranding bilaterally. Bladder is distended. Foley catheter is present within the bladder. Stomach/Bowel: Moderate-sized stool ball in the rectal vault. No dilated large or small bowel loops. No evidence of bowel wall inflammation. Stomach is unremarkable, partially decompressed. Vascular/Lymphatic: Aortic atherosclerosis. No enlarged lymph nodes seen in the abdomen or pelvis. Reproductive: No adnexal mass seen. Other: No abscess collection or free intraperitoneal air. Musculoskeletal: Degenerative spondylosis throughout the  slightly scoliotic thoracolumbar spine. No acute or suspicious osseous finding. IMPRESSION: 1. Bilateral hydronephrosis, moderate in degree, RIGHT greater than LEFT, with associated perinephric and periureteral fluid stranding bilaterally. Bladder is distended despite the presence of a Foley catheter within the bladder. Findings are suspicious for bladder outlet obstruction causing the bilateral hydronephrosis. Given the degree of perinephric and periureteral inflammation/fluid stranding, this is likely acute and and associated ascending ureteral infection and/or pyelonephritis cannot be excluded. 2. Patchy bibasilar consolidations, LEFT greater than RIGHT, suspicious for multifocal pneumonia. COVID-19? 3. Cholelithiasis without evidence of acute cholecystitis. 4. Moderate-sized stool ball in the rectal vault. No bowel obstruction. Electronically Signed   By: Franki Cabot M.D.   On: 10/01/2019 14:06   DG Chest Port 1 View  Result Date: 10/01/2019 CLINICAL DATA:  Altered mental status, diabetes mellitus, hypertension, smoker EXAM: PORTABLE CHEST 1 VIEW COMPARISON:  Portable exam 1229 hours compared to 04/23/2018 FINDINGS: Normal heart size, mediastinal contours, and pulmonary  vascularity. Mild atelectasis versus infiltrate at LEFT base. Remaining lungs clear. No pleural effusion or pneumothorax. Bones demineralized with chronic RIGHT rotator cuff tear noted. IMPRESSION: Mild atelectasis versus infiltrate at LEFT base. Electronically Signed   By: Lavonia Dana M.D.   On: 10/01/2019 12:45    EKG: Independently reviewed.   Assessment/Plan Principal Problem:   Severe sepsis (HCC) Active Problems:   Renal failure (ARF), acute on chronic (HCC)   Pressure injury of skin   Malnutrition of moderate degree   Lactic acidosis   Acute pyelonephritis   Hypotension   Hypokalemia   Chronic indwelling Foley catheter    Acute metabolic encephalopathy Etiology likely secondary to pyelonephritis complicated by  nonfunctioning chronic indwelling Foley catheter with hydronephrosis/bladder distention.  Usually alert and oriented x4 at baseline. --Continue n.p.o. with meds if able for now --Continue monitor mental status closely --Speech therapy evaluation for swallow eval  Septic shock secondary to acute bilateral pyelonephritis complicated by nonfunctioning chronic indwelling Foley catheter. Patient presenting from SNF with altered mental status, abdominal pain since yesterday.  She is usually alert and oriented x 4 at baseline.  Admitted back in September 2020; with development of recurrent urinary obstruction and Foley catheter was placed at this time and was directed to follow-up with [facility] following discharge.  Patient was febrile 100.1, tachycardic 114, tachypneic at 24, and hypotensive at 86/61.  Patient lactic acidosis of 4.0 with a white blood cell count of 14.4.  Urinalysis turbid, large leukoesterase, negative nitrite, many bacteria, greater than 50 WBCs.  CT abdomen/pelvis with bilateral hydronephrosis and bilateral pyelonephrosis with bladder distention.  Chest x-ray with patchy bibasilar opacities left greater than right significant for multifocal pneumonia.  Rapid Covid-19 test negative. --ED physician initially consulted PCCM [facility]), who deferred admission at this time --Admit inpatient, stepdown unit --Foley catheter exchanged on admission --Blood cultures and urine culture: Pending --Status post 3 L IV fluid bolus, continue NS with 20 meq KCl at 125 mL/hr --Continue to trend lactic acid --Check procalcitonin, cortisol level --Start hydrocortisone 100 mg IV every 8 hours for refractory shock in the setting of history of hypocortisolemia --Continue empiric antibiotics with vancomycin and cefepime for history of Enterococcus, Pseudomonas, Klebsiella pneumonia UTI --Strict I's and O's, especially monitor urine output, bladder scan every 6 hours x1 day --Supportive care,  antipyretics, antiemetics  Hypocortisolemia --Continue home fludrocortisone 0.1mg  PO daily --Check cortisol level --Start hydrocortisone 100 mg IV q8h  Hypokalemia Potassium 2.9 on admission.  Was given 20 mEq IV in the ED. --We will continue with IV potassium replacement, with KCl 10Meq IV x 4 --repeat BMP tonight at 2200  HLD: Holding home statin until mental status improves  Chronic urinary obstruction --Foley catheter exchanged --Continue tamsulosin when able --Strict I's and O's --Bladder scan every 6 hours to ensure no further obstruction  Abnormal chest x-ray findings Chest x-ray with patchy bibasilar opacities left greater than right significant for possible multifocal pneumonia.  On vancomycin/cefepime as above.  Rapid Covid-19 test is negative. --Covid-19 PCR: Pending --Continue airborne/contact isolation until Covid-19 PCR returns back negative.    DVT prophylaxis: Lovenox Code Status: Full code Family Communication: Updated patient's son, [name] and daughter-in-law via telephone Disposition Plan: Anticipate discharge back to [facility] once medically ready Consults called: None Admission status: Inpatient, stepdown unit   Severity of Illness: The appropriate patient status for this patient is INPATIENT. Inpatient status is judged to be reasonable and necessary in order to provide the required intensity of service to  ensure the patient's safety. The patient's presenting symptoms, physical exam findings, and initial radiographic and laboratory data in the context of their chronic comorbidities is felt to place them at high risk for further clinical deterioration. Furthermore, it is not anticipated that the patient will be medically stable for discharge from the hospital within 2 midnights of admission. The following factors support the patient status of inpatient.   " The patient's presenting symptoms include abdominal pain, altered mental status " The worrisome  physical exam findings include fever, tachycardia, tachypnea, hypotension, only responsive to painful stimuli; abdominal pain on palpation " The initial radiographic and laboratory data are worrisome because of WBC count 14.4, lactic acid 4.0, creatinine 2.51, potassium 2.9, CT abdomen pelvis with bilateral hydronephrosis with bladder distention and perinephric stranding bilaterally consistent with pyelonephritis.  Chest x-ray with patchy bibasilar opacities significant for multifocal pneumonia. " The chronic co-morbidities include hypocortisolemia, hyperlipidemia, chronic obstructive uropathy.   * I certify that at the point of admission it is my clinical judgment that the patient will require inpatient hospital care spanning beyond 2 midnights from the point of admission due to high intensity of service, high risk for further deterioration and high frequency of surveillance required.*    Chenoah Mcnally J [place] DO [facility] Available via Epic secure chat 7am-7pm After these hours, please refer to coverage provider listed on [email or web address] 10/01/2019, 3:51 PM

## 2019-10-01 NOTE — ED Notes (Signed)
Flushed foley multiple times with assistance of Dylan EMT

## 2019-10-01 NOTE — ED Notes (Signed)
Date and time results received: 10/01/19 1:41 PM  (use smartphrase ".now" to insert current time)  Test: Lactic Critical Value: 4.0  Name of Provider Notified: [place] PA  Orders Received? Or Actions Taken?: Orders Received - See Orders for details

## 2019-10-01 NOTE — Progress Notes (Addendum)
CRITICAL VALUE ALERT  Critical Value:  Lactic 3.7  Date & Time Notied:  10/01/2019 2355  Provider Notified: [place] notified  Orders Received/Actions taken: 500 cc NS bolus ordered

## 2019-10-01 NOTE — Progress Notes (Signed)
Notified bedside nurse of need to administer fluid bolus 2166 cc needed.

## 2019-10-01 NOTE — ED Provider Notes (Signed)
[facility] DEPT Provider Note   CSN: [id number] Arrival date & time: 10/01/19  1121     History Chief Complaint  Patient presents with  . Altered Mental Status    Stephanie Olson is a 79 y.o. female with past medical history of hyperkalemia, hypertension, type 2 diabetes, presenting to the emergency department from [facility] and rehab with altered mental status.  Per patient's caregiver, Fabio Asa LPN, patient had normal evening last night except was complaining of some mild abdominal pain.  This morning she was noted to be altered from her baseline which is A&O x4.  She states today the patient could not respond coherently to her.  She states she was reaching out in the air and appeared very disoriented.  She is currently on a Covid free unit.  Patient has had an indwelling catheter since specialization in August 2020 for urinary retention.  She reports patient is full code.  She is attempted to contact patient's family numerous times regarding patient sent to the ED, however has been unsuccessful.  The history is provided by the nursing home. The history is limited by the condition of the patient.       Past Medical History:  Diagnosis Date  . Arthritis   . Diabetes mellitus without complication ([place])   . Diabetic foot ulcers (HCC)    RIGHT   . Hypertension   . Peripheral vascular disease [facility])     Patient Active Problem List   Diagnosis Date Noted  . Abnormal liver function 05/27/2019  . Diet-controlled diabetes mellitus ([place])   . Adrenal insufficiency ([place])   . Metabolic acidemia   . Acute lower UTI 04/22/2019  . Metabolic acidosis [id number]  . Malnutrition of moderate degree 04/25/2018  . Pressure injury of skin 04/23/2018  . ARF (acute renal failure) ([place])   . Acute urinary retention   . AKI (acute kidney injury) ([place])   . Renal failure (ARF), acute on chronic (HCC) 04/22/2018  . Essential hypertension 04/22/2018  .  Anemia 04/22/2018  . Metabolic acidosis, normal anion gap (NAG) 04/22/2018  . Hyperkalemia 04/22/2018  . Aspiration into airway 04/22/2018  . Generalized weakness 04/22/2018  . Lethargy 04/22/2018  . Poor appetite 04/22/2018  . FTT (failure to thrive) in adult 04/22/2018  . Diabetic neuropathy ([place]) 11/21/2015  . 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/2015  . Diabetic ulcer of foot, limited to breakdown of skin ([place]) 11/21/2015  . Foot pain, left 02/14/2015  . PVD (peripheral vascular disease) ([place]) 01/29/2015    Past Surgical History:  Procedure Laterality Date  . ABDOMINAL AORTAGRAM  01/29/2015  . ATHERECTOMY Right 01/29/2015   FEMORAL ARTERY   . BALLOON ANGIOPLASTY, ARTERY Right 01/29/2015   RT FEMORAL   . FOOT SURGERY    . PERIPHERAL VASCULAR CATHETERIZATION N/A 01/29/2015   Procedure: Abdominal Aortogram w/Lower Extremity;  Surgeon: Serafina Mitchell, MD;  Location: [facility];  Service: Cardiovascular;  Laterality: N/A;     OB History   No obstetric history on file.     Family History  Problem Relation Age of Onset  . Hypertension Mother   . Hypertension Father     Social History   Tobacco Use  . Smoking status: Current Some Day Smoker    Types: Cigarettes    Last attempt to quit: 02/13/2014    Years since quitting: 5.6  . Smokeless tobacco: Never Used  . Tobacco comment: " I quit smoking along time ago "  Substance  Use Topics  . Alcohol use: No    Alcohol/week: 0.0 standard drinks  . Drug use: No    Home Medications Prior to Admission medications   Medication Sig Start Date End Date Taking? Authorizing Provider  acetaminophen (TYLENOL) 500 MG tablet Take 500 mg by mouth 2 (two) times daily as needed for mild pain.    Yes [provider]  aspirin 81 MG chewable tablet Chew 81 mg by mouth daily.   Yes [provider]  bisacodyl (DULCOLAX) 10 MG suppository Place 1 suppository (10 mg total) rectally daily as needed for moderate constipation. 04/26/18   Yes Georgette Shell, MD  Amino Acids-Protein Hydrolys (FEEDING SUPPLEMENT, PRO-STAT SUGAR FREE 64,) LIQD Take 30 mLs by mouth 2 (two) times daily. 05/14/19   Florencia Reasons, MD  dicyclomine (BENTYL) 10 MG capsule Take 10 mg by mouth 2 (two) times daily. 03/17/19   [provider]  DULoxetine (CYMBALTA) 20 MG capsule Take 20 mg by mouth daily. 04/16/19   [provider]  fludrocortisone (FLORINEF) 0.1 MG tablet Take 1 tablet (0.1 mg total) by mouth daily. 06/29/19   Renato Shin, MD  folic acid (FOLVITE) 1 MG tablet Take 1 tablet (1 mg total) by mouth daily. 05/15/19   Florencia Reasons, MD  HYDROcodone-acetaminophen (NORCO/VICODIN) 5-325 MG tablet Take 1 tablet by mouth every 4 (four) hours as needed for moderate pain. 05/14/19   Florencia Reasons, MD  Melatonin 3 MG TABS Take 3 mg by mouth at bedtime.     [provider]  mirtazapine (REMERON) 7.5 MG tablet Take 7.5 mg by mouth at bedtime.    [provider]  ondansetron (ZOFRAN) 4 MG tablet Take 1 tablet (4 mg total) by mouth every 6 (six) hours as needed for nausea. 04/26/18   Georgette Shell, MD  polyvinyl alcohol (LIQUIFILM TEARS) 1.4 % ophthalmic solution Place 1 drop into both eyes 2 (two) times daily.    [provider]  pravastatin (PRAVACHOL) 20 MG tablet Take 10 mg by mouth at bedtime. 04/05/19   [provider]  senna-docusate (SENOKOT-S) 8.6-50 MG tablet Take 1 tablet by mouth 2 (two) times daily. 05/14/19   Florencia Reasons, MD  sodium bicarbonate 650 MG tablet Take 2 tablets (1,300 mg total) by mouth 3 (three) times daily. 05/14/19   Florencia Reasons, MD  sodium zirconium cyclosilicate [facility]) 5 g packet Take 5 g by mouth every Monday, Wednesday, and Friday. 05/15/19   Florencia Reasons, MD  tamsulosin (FLOMAX) 0.4 MG CAPS capsule Take 0.4 mg by mouth daily. 04/06/19   [provider]  trolamine salicylate (ASPERCREME) 10 % cream Apply 1 application topically daily. Right lateral ankle    [provider]  vitamin  B-12 1000 MCG tablet Take 1 tablet (1,000 mcg total) by mouth daily. 05/15/19   Florencia Reasons, MD    Allergies    Patient has no known allergies.  Review of Systems   Review of Systems  Unable to perform ROS: Mental status change    Physical Exam Updated Vital Signs BP (!) 89/58   Pulse (!) 111   Temp 100.1 F (37.8 C) (Rectal)   Resp (!) 24   SpO2 100%   Physical Exam Vitals and nursing note reviewed.  Constitutional:      Appearance: She is well-developed.  HENT:     Head: Normocephalic and atraumatic.  Eyes:     Conjunctiva/sclera: Conjunctivae normal.     Pupils: Pupils are equal, round, and reactive to  light.  Cardiovascular:     Rate and Rhythm: Regular rhythm. Tachycardia present.  Pulmonary:     Effort: Pulmonary effort is normal. No respiratory distress.     Breath sounds: Normal breath sounds.  Abdominal:     General: Bowel sounds are normal.     Palpations: Abdomen is soft.     Tenderness: There is abdominal tenderness in the right lower quadrant, periumbilical area, suprapubic area and left lower quadrant. There is guarding. There is no rebound.  Skin:    General: Skin is warm.     Comments: No wounds  Neurological:     Mental Status: She is alert.     Comments: Patient is alert however not following any commands or answering any questions. She does localize to pain  Psychiatric:        Behavior: Behavior normal.     ED Results / Procedures / Treatments   Labs (all labs ordered are listed, but only abnormal results are displayed) Labs Reviewed  CBG MONITORING, ED - Abnormal; Notable for the following components:      Result Value   Glucose-Capillary 204 (*)    All other components within normal limits  CULTURE, BLOOD (ROUTINE X 2)  CULTURE, BLOOD (ROUTINE X 2)  URINE CULTURE  CBC WITH DIFFERENTIAL/PLATELET  COMPREHENSIVE METABOLIC PANEL  URINALYSIS, ROUTINE W REFLEX MICROSCOPIC  LACTIC ACID, PLASMA  LACTIC ACID, PLASMA  POC SARS CORONAVIRUS 2 AG -   ED    EKG EKG Interpretation  Date/Time:  Sunday October 01 2019 11:42:11 EST Ventricular Rate:  114 PR Interval:    QRS Duration: 104 QT Interval:  340 QTC Calculation: 469 R Axis:   37 Text Interpretation: Sinus tachycardia Low voltage, extremity leads Borderline repolarization abnormality new ischemic changes compared with prior 9/20 Confirmed by Aletta Edouard [id number]) on 10/01/2019 12:16:05 PM   Radiology No results found.  Procedures .Critical Care Performed by: , [place] N, PA-C Authorized by: , [place] N, PA-C   Critical care provider statement:    Critical care time (minutes):  45   Critical care time was exclusive of:  Separately billable procedures and treating other patients and teaching time   Critical care was necessary to treat or prevent imminent or life-threatening deterioration of the following conditions:  Shock, sepsis, metabolic crisis and renal failure   Critical care was time spent personally by me on the following activities:  Discussions with consultants, evaluation of patient's response to treatment, examination of patient, ordering and performing treatments and interventions, ordering and review of laboratory studies, ordering and review of radiographic studies, pulse oximetry, re-evaluation of patient's condition, obtaining history from patient or surrogate and review of old charts   I assumed direction of critical care for this patient from another provider in my specialty: no     (including critical care time)  Medications Ordered in ED Medications  sodium chloride 0.9 % bolus 1,000 mL (1,000 mLs Intravenous New Bag/Given (Non-Interop) 10/01/19 1216)    ED Course  I have reviewed the triage vital signs and the nursing notes.  Pertinent labs & imaging results that were available during my care of the patient were reviewed by me and considered in my medical decision making (see chart for details).  Clinical Course as of Sep 30 1517   Sun Oct 01, 2019  1201 Per pt's caregiver at facility, [facility] LPN, pt is full code. She is usually A&Ox4. Yesterday complaining of abd pain, today could not respond coherently. Reaching out  in the air, disoriented. Pt staying on a COVID-free unit.   [JR]  2261 79 year old female sent in from her facility for increased lethargy.  Noted to be low-grade fever here tachycardic and hypotensive.  Getting blood and urine culture chest x-ray Covid testing IV fluids.  Septic work-up ongoing.  Patient here is localizing pain and saying "stop that" when IVs placed.   [MB]  [id number] Patient's Foley replaced by nurse and thick puslike drainage now happening.  Likely source of her infection.  CAT scan also commented upon some multifocal pneumonia consistent with a Covid-like infection.  Awaiting her testing but the initial point-of-care was negative.  She will need admission probable stepdown versus ICU.   [MB]  1502 Consulted with critical care attending.  Recommends blood pressures have remained stable, continue IV fluids, LR, and repeat lactic.  If lactic is elevating patient will likely need admission to ICU.  However at this time is appropriate for stepdown.   [JR]    Clinical Course User Index [JR] , [place] N, PA-C [MB] Hayden Rasmussen, MD    Sepsis - Repeat Assessment  Performed at:    [facility]     Blood pressure (!) 79/46, pulse (!) 112, temperature 100.1 F (37.8 C), resp. rate 17, weight 72.2 kg, SpO2 100 %.  Heart:     Tachycardic  Lungs:    CTA  Capillary Refill:   > 2 sec  Peripheral Pulse:   Radial pulse palpable  Skin:     Pale    MDM Rules/Calculators/A&P                      Patient presenting from [facility] with altered mental status that began this morning.  She was complaining of some abdominal discomfort yesterday and today had significant change in mental status.  Patient baseline is ANO x4 per nursing facility.  On evaluation she is alert though not  following commands and unable to provide any history, localizing to pain.  She has low-grade rectal temp at 100.1 F.  She is tachycardic and hypotensive on arrival, code sepsis initiated.  IV fluids.  Chest x-ray and CT abdomen pelvis ordered due to lower abdominal tenderness and patient's recent complaint of abdominal pain to evaluate for source.  Antibiotics ordered to cover suspected possible intra-abdominal source.  Pt does have an indwelling Foley catheter, there appears to be minimal output present and nursing facility reports they did not empty her back this morning.  CT images reviewed prior to official read. Bladder appears distended. Nursing made aware to address foley catheter function. Foley removed and replaced. Large pus and sediment draining. CT read with findings consistent with pyelonephritis, also urinary outlet obstruction likely secondary to dysfunctioning Foley cath with hydronephrosis.  There is also concern for bibasilar filtrates concerning for multifocal pneumonia.  Respiratory panel PCR is sent after negative POC Covid.  Initial lactate is significantly elevated at 4.0.  Leukocytosis of 14.4.  She is noted to have an AKI with creatinine of 2.5, potassium is 2.9.  Replaced IV.  Patient remains tachycardicwith persistent hypotension stable in the [id number] systolic.  Therefore consulted critical care, who recommends as blood pressures have remained stable and lactic acidosis is not worsening, she is appropriate for stepdown.  Recommends continue IV fluids, LR ordered.  She does not require pressors at this time per critical care.  Patient admitted to the hospitalist service to stepdown, Dr. Hope Budds accepting admission.  Per nursing facility, patient  is full code.   Final Clinical Impression(s) / ED Diagnoses Final diagnoses:  Septic shock due to urinary tract infection ([place])  AKI (acute kidney injury) ([place])  Hypokalemia  Delirium  Pyelonephritis    Rx / DC Orders ED Discharge  Orders    None       , [place] N, PA-C 10/01/19 1533    Hayden Rasmussen, MD 10/01/19 1659

## 2019-10-01 NOTE — Progress Notes (Signed)
Pharmacy Antibiotic Note  Stephanie Olson is a 79 y.o. female admitted on 10/01/2019 with septic shock secondary to acute bilateral pyelonephritis complicated by nonfunctioning chronic indwelling Foley catheter. Pharmacy has been consulted for Vancomycin and Cefepime dosing.  Plan:  Vancomycin 1500mg  IV x 1. Will follow up renal function and PRN dose Vancomycin at this time due to AKI.    Cefepime 2g IV q24h.  Monitor renal function, cultures, clinical course.   Weight: 159 lb 2.8 oz (72.2 kg)  Temp (24hrs), Avg:98.8 F (37.1 C), Min:98.1 F (36.7 C), Max:100.1 F (37.8 C)  Recent Labs  Lab 10/01/19 1211 10/01/19 1407  WBC 14.4*  --   CREATININE 2.51*  --   LATICACIDVEN 4.0* 3.3*    Estimated Creatinine Clearance: 19.6 mL/min (A) (by C-G formula based on SCr of 2.51 mg/dL (H)).    No Known Allergies  Antimicrobials this admission: 1/24 Metronidazole x 1 1/24 Vancomycin >>  1/24 Cefepime >>   Dose adjustments this admission: --  Microbiology results: 1/24 BCx: sent 1/24 UCx: sent  1/24 COVID: negative 1/24 Influenza A/B: negative    Thank you for allowing pharmacy to be a part of this patient's care.   Luiz Ochoa 10/01/2019 4:37 PM

## 2019-10-01 NOTE — ED Notes (Signed)
Updated pt's son, [name], on plan of care.

## 2019-10-01 NOTE — ED Notes (Signed)
Date and time results received: 10/01/19 3:14 PM  (use smartphrase ".now" to insert current time)  Test: Lactic   Critical Value: 3.3   Name of Provider Notified: Martinique PA  Orders Received? Or Actions Taken?: Orders Received - See Orders for details

## 2019-10-02 DIAGNOSIS — E1122 Type 2 diabetes mellitus with diabetic chronic kidney disease: Secondary | ICD-10-CM

## 2019-10-02 DIAGNOSIS — R7881 Bacteremia: Secondary | ICD-10-CM

## 2019-10-02 DIAGNOSIS — N189 Chronic kidney disease, unspecified: Secondary | ICD-10-CM

## 2019-10-02 DIAGNOSIS — I129 Hypertensive chronic kidney disease with stage 1 through stage 4 chronic kidney disease, or unspecified chronic kidney disease: Secondary | ICD-10-CM

## 2019-10-02 DIAGNOSIS — E1151 Type 2 diabetes mellitus with diabetic peripheral angiopathy without gangrene: Secondary | ICD-10-CM

## 2019-10-02 DIAGNOSIS — Z87440 Personal history of urinary (tract) infections: Secondary | ICD-10-CM

## 2019-10-02 DIAGNOSIS — N179 Acute kidney failure, unspecified: Secondary | ICD-10-CM

## 2019-10-02 DIAGNOSIS — Z87891 Personal history of nicotine dependence: Secondary | ICD-10-CM

## 2019-10-02 DIAGNOSIS — Z960 Presence of urogenital implants: Secondary | ICD-10-CM

## 2019-10-02 DIAGNOSIS — B952 Enterococcus as the cause of diseases classified elsewhere: Secondary | ICD-10-CM

## 2019-10-02 DIAGNOSIS — N1330 Unspecified hydronephrosis: Secondary | ICD-10-CM

## 2019-10-02 DIAGNOSIS — B964 Proteus (mirabilis) (morganii) as the cause of diseases classified elsewhere: Secondary | ICD-10-CM

## 2019-10-02 LAB — BLOOD CULTURE ID PANEL (REFLEXED)
Acinetobacter baumannii: NOT DETECTED
Candida albicans: NOT DETECTED
Candida glabrata: NOT DETECTED
Candida krusei: NOT DETECTED
Candida parapsilosis: NOT DETECTED
Candida tropicalis: NOT DETECTED
Carbapenem resistance: NOT DETECTED
Enterobacter cloacae complex: NOT DETECTED
Enterobacteriaceae species: DETECTED — AB
Enterococcus species: DETECTED — AB
Escherichia coli: NOT DETECTED
Haemophilus influenzae: NOT DETECTED
Klebsiella oxytoca: NOT DETECTED
Klebsiella pneumoniae: NOT DETECTED
Listeria monocytogenes: NOT DETECTED
Neisseria meningitidis: NOT DETECTED
Proteus species: DETECTED — AB
Pseudomonas aeruginosa: NOT DETECTED
Serratia marcescens: NOT DETECTED
Staphylococcus aureus (BCID): NOT DETECTED
Staphylococcus species: NOT DETECTED
Streptococcus agalactiae: NOT DETECTED
Streptococcus pneumoniae: NOT DETECTED
Streptococcus pyogenes: NOT DETECTED
Streptococcus species: NOT DETECTED
Vancomycin resistance: NOT DETECTED

## 2019-10-02 LAB — LACTIC ACID, PLASMA
Lactic Acid, Venous: 2.1 mmol/L (ref 0.5–1.9)
Lactic Acid, Venous: 1.5 mmol/L (ref 0.5–1.9)

## 2019-10-02 LAB — COMPREHENSIVE METABOLIC PANEL
ALT: 10 U/L (ref 0–44)
AST: 30 U/L (ref 15–41)
Albumin: 2.3 g/dL — ABNORMAL LOW (ref 3.5–5.0)
Alkaline Phosphatase: 58 U/L (ref 38–126)
Anion gap: 7 (ref 5–15)
BUN: 55 mg/dL — ABNORMAL HIGH (ref 8–23)
CO2: 19 mmol/L — ABNORMAL LOW (ref 22–32)
Calcium: 7.8 mg/dL — ABNORMAL LOW (ref 8.9–10.3)
Chloride: 114 mmol/L — ABNORMAL HIGH (ref 98–111)
Creatinine, Ser: 2.51 mg/dL — ABNORMAL HIGH (ref 0.44–1.00)
GFR calc Af Amer: 21 mL/min — ABNORMAL LOW (ref >60)
GFR calc non Af Amer: 18 mL/min — ABNORMAL LOW (ref >60)
Glucose, Bld: 158 mg/dL — ABNORMAL HIGH (ref 70–99)
Potassium: 4.9 mmol/L (ref 3.5–5.1)
Sodium: 140 mmol/L (ref 135–145)
Total Bilirubin: 0.7 mg/dL (ref 0.3–1.2)
Total Protein: 5.3 g/dL — ABNORMAL LOW (ref 6.5–8.1)

## 2019-10-02 LAB — CREATININE, SERUM
Creatinine, Ser: 2.93 mg/dL — ABNORMAL HIGH (ref 0.44–1.00)
GFR calc Af Amer: 17 mL/min — ABNORMAL LOW (ref >60)
GFR calc non Af Amer: 15 mL/min — ABNORMAL LOW (ref >60)

## 2019-10-02 LAB — PROCALCITONIN: Procalcitonin: 68.27 ng/mL

## 2019-10-02 LAB — CBC
HCT: 31.2 % — ABNORMAL LOW (ref 36.0–46.0)
Hemoglobin: 9.6 g/dL — ABNORMAL LOW (ref 12.0–15.0)
MCH: 28.6 pg (ref 26.0–34.0)
MCHC: 30.8 g/dL (ref 30.0–36.0)
MCV: 92.9 fL (ref 80.0–100.0)
Platelets: 129 10*3/uL — ABNORMAL LOW (ref 150–400)
RBC: 3.36 MIL/uL — ABNORMAL LOW (ref 3.87–5.11)
RDW: 15.9 % — ABNORMAL HIGH (ref 11.5–15.5)
WBC: 19.3 10*3/uL — ABNORMAL HIGH (ref 4.0–10.5)
nRBC: 0 % (ref 0.0–0.2)

## 2019-10-02 LAB — MAGNESIUM: Magnesium: 1.8 mg/dL (ref 1.7–2.4)

## 2019-10-02 MED ORDER — LACTATED RINGERS IV SOLN: INTRAVENOUS | Status: DC

## 2019-10-02 MED ORDER — VANCOMYCIN HCL IN DEXTROSE 1-5 GM/200ML-% IV SOLN: 1000.0000 mg | INTRAVENOUS | Status: DC

## 2019-10-02 MED ORDER — SODIUM CHLORIDE 0.9 % IV BOLUS
500.0000 mL | Freq: Once | INTRAVENOUS | Status: AC
  Administered 2019-10-02: 500 mL via INTRAVENOUS

## 2019-10-02 NOTE — Evaluation (Signed)
Physical Therapy Evaluation-1x Patient Details Name: DOMENICA SIEMS MRN: [id number] DOB: [birth year] Today's Date: 10/02/2019   History of Present Illness  79 yo female admitted from SNF with severe sepsis, AMS. Hx of DM, neuropathy, chronic foley, CKD  Clinical Impression  Bed level eval attempted. Pt is unable to participate with PT. She did not make eye contact nor follow commands. Pt does not appear appropriate for PT. Recommend return to SNF once medically stable. Will sign off. 1x eval.     Follow Up Recommendations SNF(return to SNF)    Equipment Recommendations  None recommended by PT    Recommendations for Other Services       Precautions / Restrictions Precautions Precautions: Fall Restrictions Weight Bearing Restrictions: No      Mobility  Bed Mobility               General bed mobility comments: NT-pt not able to participate/not following commands  Transfers                    Ambulation/Gait                Stairs            Wheelchair Mobility    Modified Rankin (Stroke Patients Only)       Balance                                             Pertinent Vitals/Pain Pain Assessment: Faces Faces Pain Scale: No hurt    Home Living Family/patient expects to be discharged to:: Skilled nursing facility                 Additional Comments: resident of [place]    Prior Function           Comments: unsure of PLOF prior to this admission. During prior visit 05/2019, pt stated she was able to transfer into [facility] however pt is not a reliable historian     Hand Dominance        Extremity/Trunk Assessment   Upper Extremity Assessment Upper Extremity Assessment: Defer to OT evaluation;Difficult to assess due to impaired cognition(unable to assess UEs-pt resistant to therapist attempting to extend arms)    Lower Extremity Assessment Lower Extremity Assessment: Difficult to assess due to  impaired cognition       Communication   Communication: HOH  Cognition Arousal/Alertness: Awake/alert Behavior During Therapy: Flat affect Overall Cognitive Status: No family/caregiver present to determine baseline cognitive functioning                                 General Comments: Pt did not make eye contact during session      General Comments      Exercises     Assessment/Plan    PT Assessment Patent does not need any further PT services  PT Problem List         PT Treatment Interventions      PT Goals (Current goals can be found in the Care Plan section)  Acute Rehab PT Goals Patient Stated Goal: none stated PT Goal Formulation: Patient unable to participate in goal setting(D/C from therapy)    Frequency     Barriers to discharge        Co-evaluation  AM-PAC PT "6 Clicks" Mobility  Outcome Measure Help needed turning from your back to your side while in a flat bed without using bedrails?: Total Help needed moving from lying on your back to sitting on the side of a flat bed without using bedrails?: Total Help needed moving to and from a bed to a chair (including a wheelchair)?: Total Help needed standing up from a chair using your arms (e.g., wheelchair or bedside chair)?: Total Help needed to walk in hospital room?: Total Help needed climbing 3-5 steps with a railing? : Total 6 Click Score: 6    End of Session   Activity Tolerance: Patient tolerated treatment well Patient left: in bed;with call bell/phone within reach        Time: 1449-1457 PT Time Calculation (min) (ACUTE ONLY): 8 min   Charges:   PT Evaluation $PT Eval Low Complexity: 1 Low             Lakoda Raske P, PT Acute Rehabilitation

## 2019-10-02 NOTE — Evaluation (Addendum)
Clinical/Bedside Swallow Evaluation Patient Details  Name: Stephanie Olson MRN: [id number] Date of Birth: [birth year]  Today's Date: 10/02/2019 Time: SLP Start Time (ACUTE ONLY): 1014 SLP Stop Time (ACUTE ONLY): 1039 SLP Time Calculation (min) (ACUTE ONLY): 25 min  Past Medical History:  Past Medical History:  Diagnosis Date  . Arthritis   . Diabetes mellitus without complication ([place])   . Diabetic foot ulcers (HCC)    RIGHT   . Hypertension   . Peripheral vascular disease [facility])    Past Surgical History:  Past Surgical History:  Procedure Laterality Date  . ABDOMINAL AORTAGRAM  01/29/2015  . ATHERECTOMY Right 01/29/2015   FEMORAL ARTERY   . BALLOON ANGIOPLASTY, ARTERY Right 01/29/2015   RT FEMORAL   . FOOT SURGERY    . PERIPHERAL VASCULAR CATHETERIZATION N/A 01/29/2015   Procedure: Abdominal Aortogram w/Lower Extremity;  Surgeon: Serafina Mitchell, MD;  Location: [facility] [place] [facility];  Service: Cardiovascular;  Laterality: N/A;   HPI:  79 yo female adm to [facility] from [place] with AMS-  metabolic encephalopathy due to pyelonephritis complicated by malfunctioning catheter - foley catheter was replaced.  Pt has not been appropriate to accept po intake and SlP was consulted.  [name] RN reports removing dentures for care was difficult due to pt's mentation.  Pt CT chest showed right more than left consolidations concerning for pna, ? COVID 19 per results.  Pt also with h/o DM - diet controlled, aspiration into airway in 04/2018, smoker, FTT.  Pt has h/o dysphagia with oral holding, delay in swallow with SLP up to 45 seconds with diet recommendations of liquids only during 04/2018 admit.   Assessment / Plan / Recommendation Clinical Impression  Patient's current mentation prevents her from consuming po.  She responds to SLP x2 only of approximately 20 opportunities saying "I don't know" to desire for denture placement and nodding head "yes" to SLP to visit tomorrow.  She turns her head away when  offered oral care with suction kit, placing lip moisturizer and offering po.  A single small bolus of thin via straw was given with pt presenting with delayed swallow and strong cough - concerning for aspiration.  Of note, pt found to have oral dysphagia during 2019 admit when she aspirated - delays in swallow up to 45 seconds noted without pt awareness to lack of swallowing. Will follow up next date for po readiness. SLP Visit Diagnosis: Dysphagia, oropharyngeal phase (R13.12)    Aspiration Risk  Severe aspiration risk;Risk for inadequate nutrition/hydration    Diet Recommendation NPO;Free water protocol after oral care   Postural Changes: Seated upright at 90 degrees;Remain upright for at least 30 minutes after po intake    Other  Recommendations Oral Care Recommendations: Oral care BID   Follow up Recommendations        Frequency and Duration min 2x/week  2 weeks       Prognosis Prognosis for Safe Diet Advancement: Fair Barriers to Reach Goals: Cognitive deficits;Motivation      Swallow Study   General HPI: 79 yo female adm to [facility] from [place] with AMS-  metabolic encephalopathy due to pyelonephritis complicated by malfunctioning catheter - foley catheter was replaced.  Pt has not been appropriate to accept po intake and SlP was consulted.  [name] RN reports removing dentures for care was difficult due to pt's mentation.  Pt CT chest showed right more than left consolidations concerning for pna, ? COVID 19 per results.  Pt also with h/o DM -  diet controlled, aspiration into airway in 04/2018, smoker, FTT.  Pt has h/o dysphagia with oral holding, delay in swallow with SLP up to 45 seconds with diet recommendations of liquids only during 04/2018 admit. Type of Study: Bedside Swallow Evaluation Previous Swallow Assessment: see hpi Diet Prior to this Study: NPO(x medications, pt unable to swallow medications) History of Recent Intubation: No Behavior/Cognition: Alert Oral Cavity  Assessment: Within Functional Limits Oral Care Completed by SLP: No(pt will not open mouth to assess, maintains lip closure) Oral Cavity - Dentition: Dentures, bottom;Dentures, top(RN had removed and cleaned- THANK YOU) Patient Positioning: Upright in bed Baseline Vocal Quality: Not observed(pt only moans on occasion) Volitional Cough: Cognitively unable to elicit Volitional Swallow: Unable to elicit    Oral/Motor/Sensory Function Overall Oral Motor/Sensory Function: (pt did not follow directions for oral motor exam, able to seal lips tightly, no facial asymmetry)   Ice Chips Ice chips: Not tested Other Comments: pt would not accept   Thin Liquid Thin Liquid: Impaired Pharyngeal  Phase Impairments: Suspected delayed Swallow Other Comments: pt given a tiny amount of juice from tip of straw , delayed swallow followed by immediate congested cough apparent    Nectar Thick Nectar Thick Liquid: Not tested Other Comments: pt refused to accept   Honey Thick Honey Thick Liquid: Not tested   Puree Puree: Not tested Other Comments: pt refused to accept   Solid     Solid: Not tested      Stephanie Olson 10/02/2019,11:10 AM  [name], MS [facility] [phone]

## 2019-10-02 NOTE — Progress Notes (Signed)
Pharmacy Antibiotic Note  Stephanie Olson is a 79 y.o. female admitted on 10/01/2019 with septic shock secondary to acute bilateral pyelonephritis complicated by nonfunctioning chronic indwelling Foley catheter. Pharmacy has been consulted for Vancomycin and Cefepime dosing.  Today, 10/02/19  D2 Abxs  WBC 19  SCr 2.51 down slightly on IVF  Cultures pending  Plan:   Vanc 1500mg  x 1 given 1/24 at 1715. Start vanc 1g IV q48 with next tomorrow 1/26 at 2000 - goal AUC 400-550  Continue cefepime 2g IV q24h.  Monitor renal function, cultures, clinical course.   Height: 5\' 6"  (167.6 cm) Weight: 149 lb 4 oz (67.7 kg) IBW/kg (Calculated) : 59.3  Temp (24hrs), Avg:99.3 F (37.4 C), Min:98.1 F (36.7 C), Max:100.9 F (38.3 C)  Recent Labs  Lab 10/01/19 1211 10/01/19 1211 10/01/19 1407 10/01/19 1840 10/01/19 2200 10/02/19 0229 10/02/19 0545  WBC 14.4*  --   --  20.7*  --   --  19.3*  CREATININE 2.51*  --   --  2.93* 2.61*  --  2.51*  LATICACIDVEN 4.0*   < > 3.3* 4.4* 3.7* 2.1* 1.5   < > = values in this interval not displayed.    Estimated Creatinine Clearance: 17.6 mL/min (A) (by C-G formula based on SCr of 2.51 mg/dL (H)).    No Known Allergies  Antimicrobials this admission: 1/24 Metronidazole x 1 1/24 Vancomycin >>  1/24 Cefepime >>   Dose adjustments this admission: --  Microbiology results: 1/24 BCx: sent 1/24 UCx: sent  1/24 COVID: negative 1/24 Influenza A/B: negative    Thank you for allowing pharmacy to be a part of this patient's care.   [name] 10/02/2019 8:27 AM

## 2019-10-02 NOTE — Progress Notes (Signed)
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  Reportedly she is alert and oriented x4. In the emergency room, temperature 100.1.  Heart rate 114, respiratory 24, blood pressure 86/61.  On room air.  WBC count 14,000, procalcitonin 46, lactic acid 4.  Chest x-ray patchy bibasilar opacities, left greater than right.  CT abdomen pelvis with bilateral hydronephrosis with pyelonephritis and bladder distention.  Resuscitated, started as sepsis and admitted to stepdown unit.   Assessment & Plan:   Principal Problem:   Severe sepsis ([place]) Active Problems:   Renal failure (ARF), acute on chronic (HCC)   Pressure injury of skin   Malnutrition of moderate degree   Lactic acidosis   Acute pyelonephritis   Hypotension   Hypokalemia   Chronic indwelling Foley catheter   Sinus tachycardia  Severe sepsis present on admission: Most likely from urinary source. Continue aggressive treatments including broad-spectrum antibiotics until culture data available. Resuscitated with isotonic fluid, blood pressure responded, lactic acid improving, continue maintenance IV fluids. Foley catheter was exchanged and now draining.  Continue intake and output monitoring. On a stress dose of steroids, will continue until blood pressure responds well then de-escalate to home dose.  Acute metabolic encephalopathy: Severe and persistent.  No focal deficit.  Remains nonverbal and difficult to arouse.  Continue treatment for infection.  Will need continue close monitoring neurochecks and a stepdown unit.  Hypokalemia: Replaced.  Will  discontinue further replacement.  Acute kidney injury on chronic kidney disease stage IV: Previous available creatinine of 2 in the system.  With obstructive uropathy and chronic indwelling catheter.  Resuscitated with fluid.  Continue close monitoring.  Making adequate urine.  Multifocal pneumonia: No respiratory symptoms.  COVID-19 point-of-care test negative, PCR negative.  Broad-spectrum antibiotic should cover it.  N.p.o. until patient is more awake.  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  Will ensure that Foley is draining well.   DVT prophylaxis: Lovenox subcu Code Status: Full code Family Communication: Call placed to patient's son Mr. Philipp Ovens, unable to reach, will attempt later Disposition Plan: patient is from skilled nursing facility. Anticipated DC to skilled nursing facility, Barriers to discharge active treatment for sepsis.   Consultants:   None  Procedures:   None  Antimicrobials:  Anti-infectives (From admission, onward)   Start     Dose/Rate Route Frequency Ordered Stop   10/03/19 2000  vancomycin (VANCOCIN) IVPB 1000 mg/200 mL premix     1,000 mg 200 mL/hr over 60 Minutes Intravenous Every 48 hours 10/02/19 0830     10/02/19 1200  ceFEPIme (MAXIPIME) 2 g in sodium chloride 0.9 % 100 mL IVPB     2 g 200 mL/hr over 30 Minutes Intravenous Every 24 hours 10/01/19 1605     10/01/19 1615  vancomycin (VANCOREADY) IVPB 1500 mg/300 mL     1,500 mg 150 mL/hr over 120 Minutes Intravenous STAT 10/01/19 1606 10/01/19 1915   10/01/19 1607  vancomycin variable dose per unstable renal function (pharmacist dosing)  Status:  Discontinued      Does not apply See admin instructions 10/01/19 1607 10/02/19 0830   10/01/19 1245  ceFEPIme (MAXIPIME) 2 g in sodium chloride 0.9 % 100  mL IVPB     2 g 200 mL/hr over 30 Minutes Intravenous  Once 10/01/19 1233 10/01/19 1317   10/01/19 1245  metroNIDAZOLE (FLAGYL) IVPB 500 mg     500 mg 100 mL/hr over 60  Minutes Intravenous  Once 10/01/19 1233 10/01/19 1344         Subjective: Patient seen and examined.  Low-grade temperature overnight.  Patient had fluctuating blood pressure and now responding. Patient herself is not able to interact.  She opens eyes on deep stimulation and tries to follow very simple commands, however very lethargic.  Mostly on room air.  Objective: Vitals:   10/02/19 0700 10/02/19 0740 10/02/19 0745 10/02/19 0800  BP: (!) 82/50 (!) 86/50 (!) 86/50 110/69  Pulse: (!) 106 (!) 107 (!) 110 (!) 112  Resp: 12 17 20  (!) 23  Temp: (!) 100.9 F (38.3 C) (!) 100.7 F (38.2 C)  (!) 100.6 F (38.1 C)  TempSrc:  Axillary    SpO2: 99% 100% 100% 100%  Weight:      Height:        Intake/Output Summary (Last 24 hours) at 10/02/2019 0854 Last data filed at 10/02/2019 0600 Gross per 24 hour  Intake 2755.1 ml  Output 1260 ml  Net 1495.1 ml   Filed Weights   10/01/19 1234 10/01/19 1805  Weight: 72.2 kg 67.7 kg    Examination:  General exam: Appears calm and comfortable on room air.  Chronically sick looking. Respiratory system: Clear to auscultation. Respiratory effort normal.  No added sounds. Cardiovascular system: S1 & S2 heard, RRR. No pedal edema. Gastrointestinal system: Abdomen is nondistended, soft and nontender. No organomegaly or masses felt. Normal bowel sounds heard. No suprapubic tenderness or mass. Central nervous system: Sleepy and lethargic.  Weakness of all extremities.  Skin: No rashes, lesions or ulcers Psychiatry: Judgement and insight appear compromised.   Mood & affect flat.    Data Reviewed: I have personally reviewed following labs and imaging studies  CBC: Recent Labs  Lab 10/01/19 1211 10/01/19 1840 10/02/19 0545  WBC 14.4* 20.7* 19.3*  NEUTROABS 13.3*  --   --   HGB 10.1* 9.6* 9.6*  HCT 33.5* 31.1* 31.2*  MCV 93.6 93.4 92.9  PLT 199 157 [id number]*   Basic Metabolic Panel: Recent Labs  Lab 10/01/19 1211 10/01/19 1840  10/01/19 2200 10/02/19 0545  NA 142  --  142 140  K 2.9*  --  4.9 4.9  CL 110  --  112* 114*  CO2 17*  --  17* 19*  GLUCOSE 198*  --  208* 158*  BUN 48*  --  52* 55*  CREATININE 2.51* 2.93* 2.61* 2.51*  CALCIUM 7.0*  --  7.6* 7.8*  MG  --   --   --  1.8   GFR: Estimated Creatinine Clearance: 17.6 mL/min (A) (by C-G formula based on SCr of 2.51 mg/dL (H)). Liver Function Tests: Recent Labs  Lab 10/01/19 1211 10/02/19 0545  AST 14* 30  ALT 9 10  ALKPHOS 72 58  BILITOT 0.6 0.7  PROT 5.4* 5.3*  ALBUMIN 2.4* 2.3*   No results for input(s): LIPASE, AMYLASE in the last 168 hours. No results for input(s): AMMONIA in the last 168 hours. Coagulation Profile: Recent Labs  Lab 10/01/19 1840  INR 2.0*   Cardiac Enzymes: No results for input(s): CKTOTAL, CKMB, CKMBINDEX, TROPONINI in the last 168 hours. BNP (last 3 results) No results for input(s): PROBNP in the last 8760 hours. HbA1C: No results  for input(s): HGBA1C in the last 72 hours. CBG: Recent Labs  Lab 10/01/19 1129  GLUCAP 204*   Lipid Profile: No results for input(s): CHOL, HDL, LDLCALC, TRIG, CHOLHDL, LDLDIRECT in the last 72 hours. Thyroid Function Tests: Recent Labs    10/01/19 1840  TSH 0.836   Anemia Panel: No results for input(s): VITAMINB12, FOLATE, FERRITIN, TIBC, IRON, RETICCTPCT in the last 72 hours. Sepsis Labs: Recent Labs  Lab 10/01/19 1840 10/01/19 2200 10/02/19 0229 10/02/19 0545  PROCALCITON 46.82  --   --  68.27  LATICACIDVEN 4.4* 3.7* 2.1* 1.5    Recent Results (from the past 240 hour(s))  Blood Culture (routine x 2)     Status: None (Preliminary result)   Collection Time: 10/01/19 12:12 PM   Specimen: BLOOD  Result Value Ref Range Status   Specimen Description   Final    BLOOD RIGHT WRIST Performed at [facility] [address], [place], [place] [address]    Special Requests   Final    BOTTLES DRAWN AEROBIC AND ANAEROBIC Blood Culture results may not be  optimal due to an inadequate volume of blood received in culture bottles Performed at [facility] [address] [place], [place] [address]    Culture  Setup Time   Final    GRAM POSITIVE COCCI GRAM NEGATIVE RODS CRITICAL VALUE NOTED.  VALUE IS CONSISTENT WITH PREVIOUSLY REPORTED AND CALLED VALUE. Performed at [facility], [place] [address] [place], [place] [address]    Culture GRAM POSITIVE COCCI GRAM NEGATIVE RODS   Final   Report Status PENDING  Incomplete  Blood Culture (routine x 2)     Status: None (Preliminary result)   Collection Time: 10/01/19 12:13 PM   Specimen: BLOOD LEFT FOREARM  Result Value Ref Range Status   Specimen Description   Final    BLOOD LEFT FOREARM Performed at [facility] [address], [place], [place] [address]    Special Requests   Final    BOTTLES DRAWN AEROBIC AND ANAEROBIC Blood Culture results may not be optimal due to an inadequate volume of blood received in culture bottles Performed at [facility] [address] [place], [place] [address]    Culture  Setup Time   Final    Organism ID to follow GRAM POSITIVE COCCI GRAM NEGATIVE RODS IN BOTH AEROBIC AND ANAEROBIC BOTTLES CRITICAL RESULT CALLED TO, READ BACK BY AND VERIFIED WITH: PHARMD A. PHAM [id number] FCP Performed at [facility] [address], [place],  [address]    Culture GRAM POSITIVE COCCI GRAM NEGATIVE RODS   Final   Report Status PENDING  Incomplete  Blood Culture ID Panel (Reflexed)     Status: Abnormal   Collection Time: 10/01/19 12:13 PM  Result Value Ref Range Status   Enterococcus species DETECTED (A) NOT DETECTED Final    Comment: CRITICAL RESULT CALLED TO, READ BACK BY AND VERIFIED WITH: PHARMD A. PHAM [id number] FCP    Vancomycin resistance NOT DETECTED NOT DETECTED Final   Listeria monocytogenes NOT DETECTED NOT DETECTED Final   Staphylococcus species NOT DETECTED NOT DETECTED  Final   Staphylococcus aureus (BCID) NOT DETECTED NOT DETECTED Final   Streptococcus species NOT DETECTED NOT DETECTED Final   Streptococcus agalactiae NOT DETECTED NOT DETECTED Final   Streptococcus pneumoniae NOT DETECTED NOT DETECTED Final   Streptococcus pyogenes NOT DETECTED NOT DETECTED Final   Acinetobacter baumannii NOT DETECTED NOT DETECTED  Final   Enterobacteriaceae species DETECTED (A) NOT DETECTED Final    Comment: Enterobacteriaceae represent a large family of gram-negative bacteria, not a single organism. CRITICAL RESULT CALLED TO, READ BACK BY AND VERIFIED WITH: PHARMD A. PHAM [id number] FCP    Enterobacter cloacae complex NOT DETECTED NOT DETECTED Final   Escherichia coli NOT DETECTED NOT DETECTED Final   Klebsiella oxytoca NOT DETECTED NOT DETECTED Final   Klebsiella pneumoniae NOT DETECTED NOT DETECTED Final   Proteus species DETECTED (A) NOT DETECTED Final    Comment: CRITICAL RESULT CALLED TO, READ BACK BY AND VERIFIED WITH: PHARMD A. PHAM [id number] FCP    Serratia marcescens NOT DETECTED NOT DETECTED Final   Carbapenem resistance NOT DETECTED NOT DETECTED Final   Haemophilus influenzae NOT DETECTED NOT DETECTED Final   Neisseria meningitidis NOT DETECTED NOT DETECTED Final   Pseudomonas aeruginosa NOT DETECTED NOT DETECTED Final   Candida albicans NOT DETECTED NOT DETECTED Final   Candida glabrata NOT DETECTED NOT DETECTED Final   Candida krusei NOT DETECTED NOT DETECTED Final   Candida parapsilosis NOT DETECTED NOT DETECTED Final   Candida tropicalis NOT DETECTED NOT DETECTED Final    Comment: Performed at [facility], [place] [address], [place], [place] [address]  Respiratory Panel by RT PCR (Flu A&B, Covid) - Urine, Catheterized     Status: None   Collection Time: 10/01/19  2:27 PM   Specimen: Urine, Catheterized  Result Value Ref Range Status   SARS Coronavirus 2 by RT PCR NEGATIVE NEGATIVE Final    Comment: (NOTE) SARS-CoV-2 target nucleic acids  are NOT DETECTED. The SARS-CoV-2 RNA is generally detectable in upper respiratoy specimens during the acute phase of infection. The lowest concentration of SARS-CoV-2 viral copies this assay can detect is 131 copies/mL. A negative result does not preclude SARS-Cov-2 infection and should not be used as the sole basis for treatment or other patient management decisions. A negative result may occur with  improper specimen collection/handling, submission of specimen other than nasopharyngeal swab, presence of viral mutation(s) within the areas targeted by this assay, and inadequate number of viral copies (<131 copies/mL). A negative result must be combined with clinical observations, patient history, and epidemiological information. The expected result is Negative. Fact Sheet for Patients:  PinkCheek.be Fact Sheet for Healthcare Providers:  GravelBags.it This test is not yet ap proved or cleared by the [place] FDA and  has been authorized for detection and/or diagnosis of SARS-CoV-2 by FDA under an Emergency Use Authorization (EUA). This EUA will remain  in effect (meaning this test can be used) for the duration of the COVID-19 declaration under Section 564(b)(1) of the Act, 21 U.S.C. section 360bbb-3(b)(1), unless the authorization is terminated or revoked sooner.    Influenza A by PCR NEGATIVE NEGATIVE Final   Influenza B by PCR NEGATIVE NEGATIVE Final    Comment: (NOTE) The Xpert Xpress SARS-CoV-2/FLU/RSV assay is intended as an aid in  the diagnosis of influenza from Nasopharyngeal swab specimens and  should not be used as a sole basis for treatment. Nasal washings and  aspirates are unacceptable for Xpert Xpress SARS-CoV-2/FLU/RSV  testing. Fact Sheet for Patients: PinkCheek.be Fact Sheet for Healthcare Providers: GravelBags.it This test is not yet approved or  cleared by the [place] FDA and  has been authorized for detection and/or diagnosis of SARS-CoV-2 by  FDA under an Emergency Use Authorization (EUA). This EUA will remain  in effect (meaning this test can be used) for the  duration of the  Covid-19 declaration under Section 564(b)(1) of the Act, 21  U.S.C. section 360bbb-3(b)(1), unless the authorization is  terminated or revoked. Performed at [facility], [place] [address] [place],  [address]          Radiology Studies: CT ABDOMEN PELVIS WO CONTRAST  Result Date: 10/01/2019 CLINICAL DATA:  Sepsis, altered mental status, abdominal pain. EXAM: CT ABDOMEN AND PELVIS WITHOUT CONTRAST TECHNIQUE: Multidetector CT imaging of the abdomen and pelvis was performed following the standard protocol without IV contrast. COMPARISON:  CT abdomen dated 04/23/2019. FINDINGS: Lower chest: Patchy bibasilar consolidations, LEFT greater than RIGHT. Hepatobiliary: No focal liver abnormality is seen. Gallbladder is again moderately distended containing small layering gallstones. No pericholecystic fluid. No obvious bile duct dilatation. Pancreas: Unremarkable. No pancreatic ductal dilatation or surrounding inflammatory changes. Spleen: Normal in size without focal abnormality. Adrenals/Urinary Tract: Bilateral hydronephrosis, moderate in degree, RIGHT greater than LEFT, with associated perinephric and periureteral fluid stranding bilaterally. Bladder is distended. Foley catheter is present within the bladder. Stomach/Bowel: Moderate-sized stool ball in the rectal vault. No dilated large or small bowel loops. No evidence of bowel wall inflammation. Stomach is unremarkable, partially decompressed. Vascular/Lymphatic: Aortic atherosclerosis. No enlarged lymph nodes seen in the abdomen or pelvis. Reproductive: No adnexal mass seen. Other: No abscess collection or free intraperitoneal air. Musculoskeletal: Degenerative spondylosis throughout the  slightly scoliotic thoracolumbar spine. No acute or suspicious osseous finding. IMPRESSION: 1. Bilateral hydronephrosis, moderate in degree, RIGHT greater than LEFT, with associated perinephric and periureteral fluid stranding bilaterally. Bladder is distended despite the presence of a Foley catheter within the bladder. Findings are suspicious for bladder outlet obstruction causing the bilateral hydronephrosis. Given the degree of perinephric and periureteral inflammation/fluid stranding, this is likely acute and and associated ascending ureteral infection and/or pyelonephritis cannot be excluded. 2. Patchy bibasilar consolidations, LEFT greater than RIGHT, suspicious for multifocal pneumonia. COVID-19? 3. Cholelithiasis without evidence of acute cholecystitis. 4. Moderate-sized stool ball in the rectal vault. No bowel obstruction. Electronically Signed   By: Franki Cabot M.D.   On: 10/01/2019 14:06   DG Chest Port 1 View  Result Date: 10/01/2019 CLINICAL DATA:  Altered mental status, diabetes mellitus, hypertension, smoker EXAM: PORTABLE CHEST 1 VIEW COMPARISON:  Portable exam 1229 hours compared to 04/23/2018 FINDINGS: Normal heart size, mediastinal contours, and pulmonary vascularity. Mild atelectasis versus infiltrate at LEFT base. Remaining lungs clear. No pleural effusion or pneumothorax. Bones demineralized with chronic RIGHT rotator cuff tear noted. IMPRESSION: Mild atelectasis versus infiltrate at LEFT base. Electronically Signed   By: Lavonia Dana M.D.   On: 10/01/2019 12:45        Scheduled Meds: . Chlorhexidine Gluconate Cloth  6 each Topical Daily  . enoxaparin (LOVENOX) injection  30 mg Subcutaneous Q24H  . fludrocortisone  0.1 mg Oral Daily  . hydrocortisone sod succinate (SOLU-CORTEF) inj  100 mg Intravenous Q8H  . mouth rinse  15 mL Mouth Rinse BID  . senna-docusate  1 tablet Oral BID  . tamsulosin  0.4 mg Oral Daily   Continuous Infusions: . ceFEPime (MAXIPIME) IV    .  lactated ringers    . [START ON 10/03/2019] vancomycin       LOS: 1 day    Time spent: 40 minutes    Barb Merino, MD [facility] Pager [phone]

## 2019-10-02 NOTE — Progress Notes (Signed)
PHARMACY - PHYSICIAN COMMUNICATION CRITICAL VALUE ALERT - BLOOD CULTURE IDENTIFICATION (BCID)  Stephanie Olson is an 79 y.o. female who presented to [facility] on 10/01/2019 with a chief complaint of AMS, abd pain, chronic foley cath, hx multiple UTIs  Assessment: 4/4 bottles from blood cultures growing enterococcus and proteus  Name of physician (or Provider) Contacted: Ghimire  Current antibiotics: vanc/cefepime  Changes to prescribed antibiotics recommended:  No changes  Results for orders placed or performed during the hospital encounter of 10/01/19  Blood Culture ID Panel (Reflexed) (Collected: 10/01/2019 12:13 PM)  Result Value Ref Range   Enterococcus species DETECTED (A) NOT DETECTED   Vancomycin resistance NOT DETECTED NOT DETECTED   Listeria monocytogenes NOT DETECTED NOT DETECTED   Staphylococcus species NOT DETECTED NOT DETECTED   Staphylococcus aureus (BCID) NOT DETECTED NOT DETECTED   Streptococcus species NOT DETECTED NOT DETECTED   Streptococcus agalactiae NOT DETECTED NOT DETECTED   Streptococcus pneumoniae NOT DETECTED NOT DETECTED   Streptococcus pyogenes NOT DETECTED NOT DETECTED   Acinetobacter baumannii NOT DETECTED NOT DETECTED   Enterobacteriaceae species DETECTED (A) NOT DETECTED   Enterobacter cloacae complex NOT DETECTED NOT DETECTED   Escherichia coli NOT DETECTED NOT DETECTED   Klebsiella oxytoca NOT DETECTED NOT DETECTED   Klebsiella pneumoniae NOT DETECTED NOT DETECTED   Proteus species DETECTED (A) NOT DETECTED   Serratia marcescens NOT DETECTED NOT DETECTED   Carbapenem resistance NOT DETECTED NOT DETECTED   Haemophilus influenzae NOT DETECTED NOT DETECTED   Neisseria meningitidis NOT DETECTED NOT DETECTED   Pseudomonas aeruginosa NOT DETECTED NOT DETECTED   Candida albicans NOT DETECTED NOT DETECTED   Candida glabrata NOT DETECTED NOT DETECTED   Candida krusei NOT DETECTED NOT DETECTED   Candida parapsilosis NOT DETECTED NOT DETECTED   Candida tropicalis NOT DETECTED NOT DETECTED    [name] 10/02/2019  8:48 AM

## 2019-10-02 NOTE — Progress Notes (Signed)
OT Cancellation Note  Patient Details Name: [name] MRN: [id number] DOB: [birth year]   Cancelled Treatment:    Reason Eval/Treat Not Completed: Other (comment)(Note pt from SNF and return to SNF. Will defer OT eval to SNF)  [place], Mickel Baas, [place] [phone]    10/02/2019, 5:07 PM

## 2019-10-02 NOTE — Consult Note (Addendum)
[facility] for Infectious Disease  Total days of antibiotics 2               Reason for Consult:sepsis due to urinary source found to have proteus/enterococcal bacteremia    Referring Physician: ghimire  Principal Problem:   Severe sepsis ([place]) Active Problems:   Renal failure (ARF), acute on chronic (HCC)   Pressure injury of skin   Malnutrition of moderate degree   Lactic acidosis   Acute pyelonephritis   Hypotension   Hypokalemia   Chronic indwelling Foley catheter   Sinus tachycardia    HPI: Stephanie Olson is a 79 y.o. 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 Her infectious work up revealed GNR and [place] on blood cx found to be c/w enterococcus and her urine cx showing proteus. She was started on vancomycin and cefepime empirically.WBC improving after first day, but still has low grade temp  Imaging showed:  Bilateral hydronephrosis, moderate in degree, RIGHT greater than LEFT, with associated perinephric and periureteral fluid stranding bilaterally. Bladder is distended despite the presence of a Foley catheter within the bladder. Findings are suspicious for bladder outlet obstruction causing the bilateral hydronephrosis. Given the degree of perinephric and periureteral inflammation/fluid stranding, this is likely acute and and associated ascending ureteral infection and/or pyelonephritis cannot be excluded.   Past Medical History:  Diagnosis Date  . Arthritis   . Diabetes mellitus without complication ([place])   . Diabetic foot ulcers (HCC)    RIGHT   . Hypertension   . Peripheral vascular disease (HCC)     Allergies: No Known Allergies  MEDICATIONS: . Chlorhexidine Gluconate Cloth  6 each Topical Daily  . enoxaparin (LOVENOX) injection  30  mg Subcutaneous Q24H  . fludrocortisone  0.1 mg Oral Daily  . hydrocortisone sod succinate (SOLU-CORTEF) inj  100 mg Intravenous Q8H  . mouth rinse  15 mL Mouth Rinse BID  . senna-docusate  1 tablet Oral BID  . tamsulosin  0.4 mg Oral Daily    Social History   Tobacco Use  . Smoking status: Current Some Day Smoker    Types: Cigarettes    Last attempt to quit: 02/13/2014    Years since quitting: 5.6  . Smokeless tobacco: Never Used  . Tobacco comment: " I quit smoking along time ago "  Substance Use Topics  . Alcohol use: No    Alcohol/week: 0.0 standard drinks  . Drug use: No    Family History  Problem Relation Age of Onset  . Hypertension Mother   . Hypertension Father     Review of Systems -   Non-verbal - unable to obtain  OBJECTIVE: Temp:  [98.4 F (36.9 C)-100.9 F (38.3 C)] 100 F (37.8 C) (01/25 1400) Pulse Rate:  [97-120] 97 (01/25 1400) Resp:  [10-33] 16 (01/25 1400) BP: (74-123)/(34-70) 98/56 (01/25 1400) SpO2:  [97 %-100 %] 100 % (01/25 1400) Weight:  [67.7 kg] 67.7 kg (01/24 1805) Physical Exam  Constitutional:  oriented to self only. appears well-developed and well-nourished. No distress.  HENT: [place]/AT, PERRLA, no scleral icterus Mouth/Throat: Oropharynx is clear and moist. No oropharyngeal exudate.  Cardiovascular: Normal rate, regular rhythm and normal heart sounds. Exam reveals no gallop and no friction rub.  No murmur heard.  Pulmonary/Chest: Effort normal and breath  sounds normal. No respiratory distress.  has no wheezes.  Neck = supple, no nuchal rigidity Abdominal: Soft. Bowel sounds are normal.  exhibits no distension. There is no tenderness Gu= foley in place.  Lymphadenopathy: no cervical adenopathy. No axillary adenopathy Neurological: alert and oriented to person only. Does not follow commands UEA:VWUJWJ extremity Skin: Skin is warm and dry. No rash noted. No erythema.    LABS: Results for orders placed or performed during the hospital  encounter of 10/01/19 (from the past 48 hour(s))  CBG monitoring, ED     Status: Abnormal   Collection Time: 10/01/19 11:29 AM  Result Value Ref Range   Glucose-Capillary 204 (H) 70 - 99 mg/dL  CBC with Differential     Status: Abnormal   Collection Time: 10/01/19 12:11 PM  Result Value Ref Range   WBC 14.4 (H) 4.0 - 10.5 K/uL   RBC 3.58 (L) 3.87 - 5.11 MIL/uL   Hemoglobin 10.1 (L) 12.0 - 15.0 g/dL   HCT 33.5 (L) 36.0 - 46.0 %   MCV 93.6 80.0 - 100.0 fL   MCH 28.2 26.0 - 34.0 pg   MCHC 30.1 30.0 - 36.0 g/dL   RDW 15.5 11.5 - 15.5 %   Platelets 199 150 - 400 K/uL   nRBC 0.0 0.0 - 0.2 %   Neutrophils Relative % 92 %   Neutro Abs 13.3 (H) 1.7 - 7.7 K/uL   Lymphocytes Relative 4 %   Lymphs Abs 0.6 (L) 0.7 - 4.0 K/uL   Monocytes Relative 3 %   Monocytes Absolute 0.4 0.1 - 1.0 K/uL   Eosinophils Relative 0 %   Eosinophils Absolute 0.0 0.0 - 0.5 K/uL   Basophils Relative 0 %   Basophils Absolute 0.1 0.0 - 0.1 K/uL   WBC Morphology VACUOLATED NEUTROPHILS    Immature Granulocytes 1 %   Abs Immature Granulocytes 0.11 (H) 0.00 - 0.07 K/uL   Burr Cells PRESENT    Polychromasia PRESENT     Comment: Performed at [facility], [place] [address], [place], [place] [address]  Comprehensive metabolic panel     Status: Abnormal   Collection Time: 10/01/19 12:11 PM  Result Value Ref Range   Sodium 142 135 - 145 mmol/L   Potassium 2.9 (L) 3.5 - 5.1 mmol/L   Chloride 110 98 - 111 mmol/L   CO2 17 (L) 22 - 32 mmol/L   Glucose, Bld 198 (H) 70 - 99 mg/dL   BUN 48 (H) 8 - 23 mg/dL   Creatinine, Ser 2.51 (H) 0.44 - 1.00 mg/dL   Calcium 7.0 (L) 8.9 - 10.3 mg/dL   Total Protein 5.4 (L) 6.5 - 8.1 g/dL   Albumin 2.4 (L) 3.5 - 5.0 g/dL   AST 14 (L) 15 - 41 U/L   ALT 9 0 - 44 U/L   Alkaline Phosphatase 72 38 - 126 U/L   Total Bilirubin 0.6 0.3 - 1.2 mg/dL   GFR calc non Af Amer 18 (L) >60 mL/min   GFR calc Af Amer 21 (L) >60 mL/min   Anion gap 15 5 - 15    Comment: Performed at  [facility], [place] [address], [place], [place] [address]  Lactic acid, plasma     Status: Abnormal   Collection Time: 10/01/19 12:11 PM  Result Value Ref Range   Lactic Acid, Venous 4.0 (HH) 0.5 - 1.9 mmol/L    Comment: CRITICAL RESULT CALLED TO, READ BACK BY AND VERIFIED WITH: M.MCIVER,RN [id number] '@1341'$   BY V.WILKINS Performed at [facility], [place] [address] [place], [place] [address]   Blood Culture (routine x 2)     Status: None (Preliminary result)   Collection Time: 10/01/19 12:12 PM   Specimen: BLOOD  Result Value Ref Range   Specimen Description      BLOOD RIGHT WRIST Performed at [place] [address], [place], [place] [address]    Special Requests      BOTTLES DRAWN AEROBIC AND ANAEROBIC Blood Culture results may not be optimal due to an inadequate volume of blood received in culture bottles Performed at [facility], [place] [address], [place], [place] [address]    Culture  Setup Time      GRAM POSITIVE COCCI GRAM NEGATIVE RODS CRITICAL VALUE NOTED.  VALUE IS CONSISTENT WITH PREVIOUSLY REPORTED AND CALLED VALUE. Performed at [facility], [place] [address] [place], [place] [address]    Culture GRAM POSITIVE COCCI GRAM NEGATIVE RODS     Report Status PENDING   Blood Culture (routine x 2)     Status: None (Preliminary result)   Collection Time: 10/01/19 12:13 PM   Specimen: BLOOD LEFT FOREARM  Result Value Ref Range   Specimen Description      BLOOD LEFT FOREARM Performed at [place] [address], [place], [place] [address]    Special Requests      BOTTLES DRAWN AEROBIC AND ANAEROBIC Blood Culture results may not be optimal due to an inadequate volume of blood received in culture bottles Performed at [facility], [place] [address], [place], [place] [address]    Culture  Setup Time      Organism ID to follow GRAM POSITIVE COCCI GRAM NEGATIVE  RODS IN BOTH AEROBIC AND ANAEROBIC BOTTLES CRITICAL RESULT CALLED TO, READ BACK BY AND VERIFIED WITH: PHARMD A. PHAM [id number] FCP Performed at [place] [address], [place], [place] [address]    Culture GRAM POSITIVE COCCI GRAM NEGATIVE RODS     Report Status PENDING   Blood Culture ID Panel (Reflexed)     Status: Abnormal   Collection Time: 10/01/19 12:13 PM  Result Value Ref Range   Enterococcus species DETECTED (A) NOT DETECTED    Comment: CRITICAL RESULT CALLED TO, READ BACK BY AND VERIFIED WITH: PHARMD A. PHAM [id number] FCP    Vancomycin resistance NOT DETECTED NOT DETECTED   Listeria monocytogenes NOT DETECTED NOT DETECTED   Staphylococcus species NOT DETECTED NOT DETECTED   Staphylococcus aureus (BCID) NOT DETECTED NOT DETECTED   Streptococcus species NOT DETECTED NOT DETECTED   Streptococcus agalactiae NOT DETECTED NOT DETECTED   Streptococcus pneumoniae NOT DETECTED NOT DETECTED   Streptococcus pyogenes NOT DETECTED NOT DETECTED   Acinetobacter baumannii NOT DETECTED NOT DETECTED   Enterobacteriaceae species DETECTED (A) NOT DETECTED    Comment: Enterobacteriaceae represent a large family of gram-negative bacteria, not a single organism. CRITICAL RESULT CALLED TO, READ BACK BY AND VERIFIED WITH: PHARMD A. PHAM [phone] [id number] FCP    Enterobacter cloacae complex NOT DETECTED NOT DETECTED   Escherichia coli NOT DETECTED NOT DETECTED   Klebsiella oxytoca NOT DETECTED NOT DETECTED   Klebsiella pneumoniae NOT DETECTED NOT DETECTED   Proteus species DETECTED (A) NOT DETECTED    Comment: CRITICAL RESULT CALLED TO, READ BACK BY AND VERIFIED WITH: PHARMD A. PHAM [phone] [id number] FCP    Serratia marcescens NOT DETECTED NOT DETECTED   Carbapenem resistance NOT  DETECTED NOT DETECTED   Haemophilus influenzae NOT DETECTED NOT DETECTED   Neisseria meningitidis NOT DETECTED NOT DETECTED   Pseudomonas aeruginosa NOT DETECTED NOT DETECTED   Candida albicans NOT DETECTED  NOT DETECTED   Candida glabrata NOT DETECTED NOT DETECTED   Candida krusei NOT DETECTED NOT DETECTED   Candida parapsilosis NOT DETECTED NOT DETECTED   Candida tropicalis NOT DETECTED NOT DETECTED    Comment: Performed at [facility], [place] [address] [place], [place] [address]  POC SARS Coronavirus 2 Ag-ED - Nasal Swab (BD Veritor Kit)     Status: None   Collection Time: 10/01/19  1:05 PM  Result Value Ref Range   SARS Coronavirus 2 Ag NEGATIVE NEGATIVE    Comment: (NOTE) SARS-CoV-2 antigen NOT DETECTED.  Negative results are presumptive.  Negative results do not preclude SARS-CoV-2 infection and should not be used as the sole basis for treatment or other patient management decisions, including infection  control decisions, particularly in the presence of clinical signs and  symptoms consistent with COVID-19, or in those who have been in contact with the virus.  Negative results must be combined with clinical observations, patient history, and epidemiological information. The expected result is Negative. Fact Sheet for Patients: PodPark.tn Fact Sheet for Healthcare Providers: GiftContent.is This test is not yet approved or cleared by the [place] FDA and  has been authorized for detection and/or diagnosis of SARS-CoV-2 by FDA under an Emergency Use Authorization (EUA).  This EUA will remain in effect (meaning this test can be used) for the duration of  the COVID-19 de claration under Section 564(b)(1) of the Act, 21 U.S.C. section 360bbb-3(b)(1), unless the authorization is terminated or revoked sooner.   Lactic acid, plasma     Status: Abnormal   Collection Time: 10/01/19  2:07 PM  Result Value Ref Range   Lactic Acid, Venous 3.3 (HH) 0.5 - 1.9 mmol/L    Comment: CRITICAL RESULT CALLED TO, READ BACK BY AND VERIFIED WITH: M.MCIVER,RN [id number] '@1514'$  BY V.WILKINS Performed at [facility], [place]  [address] [place], [place] [address]   Urinalysis, Routine w reflex microscopic     Status: Abnormal   Collection Time: 10/01/19  2:27 PM  Result Value Ref Range   Color, Urine YELLOW YELLOW   APPearance TURBID (A) CLEAR   Specific Gravity, Urine 1.014 1.005 - 1.030   pH 8.0 5.0 - 8.0   Glucose, UA NEGATIVE NEGATIVE mg/dL   Hgb urine dipstick SMALL (A) NEGATIVE   Bilirubin Urine NEGATIVE NEGATIVE   Ketones, ur NEGATIVE NEGATIVE mg/dL   Protein, ur >=300 (A) NEGATIVE mg/dL   Nitrite NEGATIVE NEGATIVE   Leukocytes,Ua LARGE (A) NEGATIVE   RBC / HPF 21-50 0 - 5 RBC/hpf   WBC, UA >50 (H) 0 - 5 WBC/hpf   Bacteria, UA MANY (A) NONE SEEN   WBC Clumps PRESENT     Comment: Performed at [facility], [place] [address] [place], [place] [address]  Urine culture     Status: Abnormal (Preliminary result)   Collection Time: 10/01/19  2:27 PM   Specimen: In/Out Cath Urine  Result Value Ref Range   Specimen Description      IN/OUT CATH URINE Performed at [facility], [place] [address], [place], [place] [address]    Special Requests      NONE Performed at [facility], [place] [address], [place], [place] [address]    Culture (A)     >=  100,000 COLONIES/mL PROTEUS MIRABILIS IDENTIFICATION AND SUSCEPTIBILITIES TO FOLLOW Performed at [facility], [place] [address] [place], [place] [address]    Report Status PENDING   Respiratory Panel by RT PCR (Flu A&B, Covid) - Urine, Catheterized     Status: None   Collection Time: 10/01/19  2:27 PM   Specimen: Urine, Catheterized  Result Value Ref Range   SARS Coronavirus 2 by RT PCR NEGATIVE NEGATIVE    Comment: (NOTE) SARS-CoV-2 target nucleic acids are NOT DETECTED. The SARS-CoV-2 RNA is generally detectable in upper respiratoy specimens during the acute phase of infection. The lowest concentration of SARS-CoV-2 viral copies this assay can detect is 131 copies/mL. A negative result does not  preclude SARS-Cov-2 infection and should not be used as the sole basis for treatment or other patient management decisions. A negative result may occur with  improper specimen collection/handling, submission of specimen other than nasopharyngeal swab, presence of viral mutation(s) within the areas targeted by this assay, and inadequate number of viral copies (<131 copies/mL). A negative result must be combined with clinical observations, patient history, and epidemiological information. The expected result is Negative. Fact Sheet for Patients:  PinkCheek.be Fact Sheet for Healthcare Providers:  GravelBags.it This test is not yet ap proved or cleared by the [place] FDA and  has been authorized for detection and/or diagnosis of SARS-CoV-2 by FDA under an Emergency Use Authorization (EUA). This EUA will remain  in effect (meaning this test can be used) for the duration of the COVID-19 declaration under Section 564(b)(1) of the Act, 21 U.S.C. section 360bbb-3(b)(1), unless the authorization is terminated or revoked sooner.    Influenza A by PCR NEGATIVE NEGATIVE   Influenza B by PCR NEGATIVE NEGATIVE    Comment: (NOTE) The Xpert Xpress SARS-CoV-2/FLU/RSV assay is intended as an aid in  the diagnosis of influenza from Nasopharyngeal swab specimens and  should not be used as a sole basis for treatment. Nasal washings and  aspirates are unacceptable for Xpert Xpress SARS-CoV-2/FLU/RSV  testing. Fact Sheet for Patients: PinkCheek.be Fact Sheet for Healthcare Providers: GravelBags.it This test is not yet approved or cleared by the [place] FDA and  has been authorized for detection and/or diagnosis of SARS-CoV-2 by  FDA under an Emergency Use Authorization (EUA). This EUA will remain  in effect (meaning this test can be used) for the duration of the  Covid-19  declaration under Section 564(b)(1) of the Act, 21  U.S.C. section 360bbb-3(b)(1), unless the authorization is  terminated or revoked. Performed at [facility], [place] [address] [place], [place] [address]   Lactic acid, plasma     Status: Abnormal   Collection Time: 10/01/19  6:40 PM  Result Value Ref Range   Lactic Acid, Venous 4.4 (HH) 0.5 - 1.9 mmol/L    Comment: CRITICAL VALUE NOTED.  VALUE IS CONSISTENT WITH PREVIOUSLY REPORTED AND CALLED VALUE. Performed at [facility], [place] [address] [place], [place] [address]   Protime-INR     Status: Abnormal   Collection Time: 10/01/19  6:40 PM  Result Value Ref Range   Prothrombin Time 22.1 (H) 11.4 - 15.2 seconds   INR 2.0 (H) 0.8 - 1.2    Comment: (NOTE) INR goal varies based on device and disease states. Performed at [facility], [place] [address], [place],  [address]   APTT     Status: Abnormal   Collection Time: 10/01/19  6:40 PM  Result Value Ref Range  aPTT 45 (H) 24 - 36 seconds    Comment:        IF BASELINE aPTT IS ELEVATED, SUGGEST PATIENT RISK ASSESSMENT BE USED TO DETERMINE APPROPRIATE ANTICOAGULANT THERAPY. Performed at [facility], [place] [address] [place], [place] [address]   CBC     Status: Abnormal   Collection Time: 10/01/19  6:40 PM  Result Value Ref Range   WBC 20.7 (H) 4.0 - 10.5 K/uL   RBC 3.33 (L) 3.87 - 5.11 MIL/uL   Hemoglobin 9.6 (L) 12.0 - 15.0 g/dL   HCT 31.1 (L) 36.0 - 46.0 %   MCV 93.4 80.0 - 100.0 fL   MCH 28.8 26.0 - 34.0 pg   MCHC 30.9 30.0 - 36.0 g/dL   RDW 15.9 (H) 11.5 - 15.5 %   Platelets 157 150 - 400 K/uL    Comment: REPEATED TO VERIFY PLATELET COUNT CONFIRMED BY SMEAR SPECIMEN CHECKED FOR CLOTS    nRBC 0.0 0.0 - 0.2 %    Comment: Performed at [facility], [place] [address] [place], [place] [address]  Creatinine, serum     Status: Abnormal   Collection Time: 10/01/19  6:40 PM    Result Value Ref Range   Creatinine, Ser 2.93 (H) 0.44 - 1.00 mg/dL   GFR calc non Af Amer 15 (L) >60 mL/min   GFR calc Af Amer 17 (L) >60 mL/min    Comment: Performed at [facility], [place] [address], [place], [place] [address]  TSH     Status: None   Collection Time: 10/01/19  6:40 PM  Result Value Ref Range   TSH 0.836 0.350 - 4.500 uIU/mL    Comment: Performed by a 3rd Generation assay with a functional sensitivity of <=0.01 uIU/mL. Performed at [facility], [place] [address] [place], [place] [address]   Cortisol     Status: None   Collection Time: 10/01/19  6:40 PM  Result Value Ref Range   Cortisol, Plasma >100.0 ug/dL    Comment: RESULTS CONFIRMED BY MANUAL DILUTION (NOTE) AM    6.7 - 22.6 ug/dL PM   <10.0       ug/dL Performed at [place] [address], [place], [place] [address]   Procalcitonin - Baseline     Status: None   Collection Time: 10/01/19  6:40 PM  Result Value Ref Range   Procalcitonin 46.82 ng/mL    Comment:        Interpretation: PCT >= 10 ng/mL: Important systemic inflammatory response, almost exclusively due to severe bacterial sepsis or septic shock. (NOTE)       Sepsis PCT Algorithm           Lower Respiratory Tract                                      Infection PCT Algorithm    ----------------------------     ----------------------------         PCT < 0.25 ng/mL                PCT < 0.10 ng/mL         Strongly encourage             Strongly discourage   discontinuation of antibiotics    initiation of antibiotics    ----------------------------     -----------------------------  PCT 0.25 - 0.50 ng/mL            PCT 0.10 - 0.25 ng/mL               OR       >80% decrease in PCT            Discourage initiation of                                            antibiotics      Encourage discontinuation           of antibiotics    ----------------------------     -----------------------------          PCT >= 0.50 ng/mL              PCT 0.26 - 0.50 ng/mL                AND       <80% decrease in PCT             Encourage initiation of                                             antibiotics       Encourage continuation           of antibiotics    ----------------------------     -----------------------------        PCT >= 0.50 ng/mL                  PCT > 0.50 ng/mL               AND         increase in PCT                  Strongly encourage                                      initiation of antibiotics    Strongly encourage escalation           of antibiotics                                     -----------------------------                                           PCT <= 0.25 ng/mL                                                 OR                                        > 80% decrease in PCT  Discontinue / Do not initiate                                             antibiotics Performed at [place] [address], [place], [place] [address]   Basic metabolic panel     Status: Abnormal   Collection Time: 10/01/19 10:00 PM  Result Value Ref Range   Sodium 142 135 - 145 mmol/L   Potassium 4.9 3.5 - 5.1 mmol/L    Comment: DELTA CHECK NOTED NO VISIBLE HEMOLYSIS    Chloride 112 (H) 98 - 111 mmol/L   CO2 17 (L) 22 - 32 mmol/L   Glucose, Bld 208 (H) 70 - 99 mg/dL   BUN 52 (H) 8 - 23 mg/dL   Creatinine, Ser 2.61 (H) 0.44 - 1.00 mg/dL   Calcium 7.6 (L) 8.9 - 10.3 mg/dL   GFR calc non Af Amer 17 (L) >60 mL/min   GFR calc Af Amer 20 (L) >60 mL/min   Anion gap 13 5 - 15    Comment: Performed at [facility] [place] [address], [place], [place] [address]  Lactic acid, plasma     Status: Abnormal   Collection Time: 10/01/19 10:00 PM  Result Value Ref Range   Lactic Acid, Venous 3.7 (HH) 0.5 - 1.9 mmol/L    Comment: CRITICAL RESULT CALLED TO, READ BACK BY AND VERIFIED WITH: MCMILLIAN, S AT 2352 ON  10/01/2019 BY MOSLEY,J Performed at [facility], [place] [address] [place], [place] [address]   Lactic acid, plasma     Status: Abnormal   Collection Time: 10/02/19  2:29 AM  Result Value Ref Range   Lactic Acid, Venous 2.1 (HH) 0.5 - 1.9 mmol/L    Comment: CRITICAL VALUE NOTED.  VALUE IS CONSISTENT WITH PREVIOUSLY REPORTED AND CALLED VALUE. Performed at [facility], [place] [address] [place], [place] [address]   Magnesium     Status: None   Collection Time: 10/02/19  5:45 AM  Result Value Ref Range   Magnesium 1.8 1.7 - 2.4 mg/dL    Comment: Performed at [facility], [place] [address] [place], [place] [address]  Comprehensive metabolic panel     Status: Abnormal   Collection Time: 10/02/19  5:45 AM  Result Value Ref Range   Sodium 140 135 - 145 mmol/L   Potassium 4.9 3.5 - 5.1 mmol/L   Chloride 114 (H) 98 - 111 mmol/L   CO2 19 (L) 22 - 32 mmol/L   Glucose, Bld 158 (H) 70 - 99 mg/dL   BUN 55 (H) 8 - 23 mg/dL   Creatinine, Ser 2.51 (H) 0.44 - 1.00 mg/dL   Calcium 7.8 (L) 8.9 - 10.3 mg/dL   Total Protein 5.3 (L) 6.5 - 8.1 g/dL   Albumin 2.3 (L) 3.5 - 5.0 g/dL   AST 30 15 - 41 U/L   ALT 10 0 - 44 U/L   Alkaline Phosphatase 58 38 - 126 U/L   Total Bilirubin 0.7 0.3 - 1.2 mg/dL   GFR calc non Af Amer 18 (L) >60 mL/min   GFR calc Af Amer 21 (L) >60 mL/min   Anion gap 7 5 - 15    Comment: Performed at [facility], [place] [name], [place], [place] [address]  CBC     Status: Abnormal  Collection Time: 10/02/19  5:45 AM  Result Value Ref Range   WBC 19.3 (H) 4.0 - 10.5 K/uL   RBC 3.36 (L) 3.87 - 5.11 MIL/uL   Hemoglobin 9.6 (L) 12.0 - 15.0 g/dL   HCT 31.2 (L) 36.0 - 46.0 %   MCV 92.9 80.0 - 100.0 fL   MCH 28.6 26.0 - 34.0 pg   MCHC 30.8 30.0 - 36.0 g/dL   RDW 15.9 (H) 11.5 - 15.5 %   Platelets 129 (L) 150 - 400 K/uL   nRBC 0.0 0.0 - 0.2 %    Comment: Performed at [facility], [place]  [address], [place], [place] [address]  Procalcitonin     Status: None   Collection Time: 10/02/19  5:45 AM  Result Value Ref Range   Procalcitonin 68.27 ng/mL    Comment:        Interpretation: PCT >= 10 ng/mL: Important systemic inflammatory response, almost exclusively due to severe bacterial sepsis or septic shock. (NOTE)       Sepsis PCT Algorithm           Lower Respiratory Tract                                      Infection PCT Algorithm    ----------------------------     ----------------------------         PCT < 0.25 ng/mL                PCT < 0.10 ng/mL         Strongly encourage             Strongly discourage   discontinuation of antibiotics    initiation of antibiotics    ----------------------------     -----------------------------       PCT 0.25 - 0.50 ng/mL            PCT 0.10 - 0.25 ng/mL               OR       >80% decrease in PCT            Discourage initiation of                                            antibiotics      Encourage discontinuation           of antibiotics    ----------------------------     -----------------------------         PCT >= 0.50 ng/mL              PCT 0.26 - 0.50 ng/mL                AND       <80% decrease in PCT             Encourage initiation of                                             antibiotics       Encourage continuation           of antibiotics    ----------------------------     -----------------------------  PCT >= 0.50 ng/mL                  PCT > 0.50 ng/mL               AND         increase in PCT                  Strongly encourage                                      initiation of antibiotics    Strongly encourage escalation           of antibiotics                                     -----------------------------                                           PCT <= 0.25 ng/mL                                                 OR                                        > 80% decrease in PCT                                      Discontinue / Do not initiate                                             antibiotics Performed at [facility] [address] [place], [place] [address]   Lactic acid, plasma     Status: None   Collection Time: 10/02/19  5:45 AM  Result Value Ref Range   Lactic Acid, Venous 1.5 0.5 - 1.9 mmol/L    Comment: Performed at [facility], [place] [address] [place], [place] [address]    MICRO:  IMAGING: CT ABDOMEN PELVIS WO CONTRAST  Result Date: 10/01/2019 CLINICAL DATA:  Sepsis, altered mental status, abdominal pain. EXAM: CT ABDOMEN AND PELVIS WITHOUT CONTRAST TECHNIQUE: Multidetector CT imaging of the abdomen and pelvis was performed following the standard protocol without IV contrast. COMPARISON:  CT abdomen dated 04/23/2019. FINDINGS: Lower chest: Patchy bibasilar consolidations, LEFT greater than RIGHT. Hepatobiliary: No focal liver abnormality is seen. Gallbladder is again moderately distended containing small layering gallstones. No pericholecystic fluid. No obvious bile duct dilatation. Pancreas: Unremarkable. No pancreatic ductal dilatation or surrounding inflammatory changes. Spleen: Normal in size without focal abnormality. Adrenals/Urinary Tract: Bilateral hydronephrosis, moderate in degree, RIGHT greater than LEFT, with associated perinephric and periureteral fluid stranding bilaterally. Bladder is distended. Foley catheter is present within the bladder. Stomach/Bowel: Moderate-sized stool ball in the rectal vault. No dilated  large or small bowel loops. No evidence of bowel wall inflammation. Stomach is unremarkable, partially decompressed. Vascular/Lymphatic: Aortic atherosclerosis. No enlarged lymph nodes seen in the abdomen or pelvis. Reproductive: No adnexal mass seen. Other: No abscess collection or free intraperitoneal air. Musculoskeletal: Degenerative spondylosis throughout the slightly scoliotic thoracolumbar spine. No acute  or suspicious osseous finding. IMPRESSION: 1. Bilateral hydronephrosis, moderate in degree, RIGHT greater than LEFT, with associated perinephric and periureteral fluid stranding bilaterally. Bladder is distended despite the presence of a Foley catheter within the bladder. Findings are suspicious for bladder outlet obstruction causing the bilateral hydronephrosis. Given the degree of perinephric and periureteral inflammation/fluid stranding, this is likely acute and and associated ascending ureteral infection and/or pyelonephritis cannot be excluded. 2. Patchy bibasilar consolidations, LEFT greater than RIGHT, suspicious for multifocal pneumonia. COVID-19? 3. Cholelithiasis without evidence of acute cholecystitis. 4. Moderate-sized stool ball in the rectal vault. No bowel obstruction. Electronically Signed   By: Franki Cabot M.D.   On: 10/01/2019 14:06   DG Chest Port 1 View  Result Date: 10/01/2019 CLINICAL DATA:  Altered mental status, diabetes mellitus, hypertension, smoker EXAM: PORTABLE CHEST 1 VIEW COMPARISON:  Portable exam 1229 hours compared to 04/23/2018 FINDINGS: Normal heart size, mediastinal contours, and pulmonary vascularity. Mild atelectasis versus infiltrate at LEFT base. Remaining lungs clear. No pleural effusion or pneumothorax. Bones demineralized with chronic RIGHT rotator cuff tear noted. IMPRESSION: Mild atelectasis versus infiltrate at LEFT base. Electronically Signed   By: Lavonia Dana M.D.   On: 10/01/2019 12:45   Assessment/Plan: 79yo F with sepsis of urinary source, with secondary bacteremia with proteus and enterococcus. 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  - continue on current regimen. Await to hear sensitivities due out tomorrow to narrow abtx - recommend to get TTE to evaluate for signs of endocarditis, may need TEE if not clear view of valves - recommend repeat blood cx tomorrow to see that they have cleared

## 2019-10-03 ENCOUNTER — Inpatient Hospital Stay (HOSPITAL_COMMUNITY): Payer: Medicare Other

## 2019-10-03 ENCOUNTER — Encounter (HOSPITAL_COMMUNITY): Payer: Self-pay | Admitting: Internal Medicine

## 2019-10-03 ENCOUNTER — Other Ambulatory Visit: Payer: Self-pay

## 2019-10-03 DIAGNOSIS — I340 Nonrheumatic mitral (valve) insufficiency: Secondary | ICD-10-CM

## 2019-10-03 LAB — BASIC METABOLIC PANEL
Anion gap: 9 (ref 5–15)
BUN: 60 mg/dL — ABNORMAL HIGH (ref 8–23)
CO2: 18 mmol/L — ABNORMAL LOW (ref 22–32)
Calcium: 8.3 mg/dL — ABNORMAL LOW (ref 8.9–10.3)
Chloride: 115 mmol/L — ABNORMAL HIGH (ref 98–111)
Creatinine, Ser: 2.56 mg/dL — ABNORMAL HIGH (ref 0.44–1.00)
GFR calc Af Amer: 20 mL/min — ABNORMAL LOW (ref >60)
GFR calc non Af Amer: 17 mL/min — ABNORMAL LOW (ref >60)
Glucose, Bld: 139 mg/dL — ABNORMAL HIGH (ref 70–99)
Potassium: 4.8 mmol/L (ref 3.5–5.1)
Sodium: 142 mmol/L (ref 135–145)

## 2019-10-03 LAB — CBC WITH DIFFERENTIAL/PLATELET
Abs Immature Granulocytes: 0.39 10*3/uL — ABNORMAL HIGH (ref 0.00–0.07)
Basophils Absolute: 0 10*3/uL (ref 0.0–0.1)
Basophils Relative: 0 %
Eosinophils Absolute: 0 10*3/uL (ref 0.0–0.5)
Eosinophils Relative: 0 %
HCT: 30.4 % — ABNORMAL LOW (ref 36.0–46.0)
Hemoglobin: 9.5 g/dL — ABNORMAL LOW (ref 12.0–15.0)
Immature Granulocytes: 2 %
Lymphocytes Relative: 9 %
Lymphs Abs: 2.1 10*3/uL (ref 0.7–4.0)
MCH: 28.7 pg (ref 26.0–34.0)
MCHC: 31.3 g/dL (ref 30.0–36.0)
MCV: 91.8 fL (ref 80.0–100.0)
Monocytes Absolute: 0.9 10*3/uL (ref 0.1–1.0)
Monocytes Relative: 4 %
Neutro Abs: 18.9 10*3/uL — ABNORMAL HIGH (ref 1.7–7.7)
Neutrophils Relative %: 85 %
Platelets: 128 10*3/uL — ABNORMAL LOW (ref 150–400)
RBC: 3.31 MIL/uL — ABNORMAL LOW (ref 3.87–5.11)
RDW: 15.7 % — ABNORMAL HIGH (ref 11.5–15.5)
WBC: 22.2 10*3/uL — ABNORMAL HIGH (ref 4.0–10.5)
nRBC: 0 % (ref 0.0–0.2)

## 2019-10-03 LAB — PROCALCITONIN: Procalcitonin: 54.86 ng/mL

## 2019-10-03 LAB — MAGNESIUM: Magnesium: 1.9 mg/dL (ref 1.7–2.4)

## 2019-10-03 LAB — ECHOCARDIOGRAM COMPLETE
Height: 66 in
Weight: 2388.02 oz

## 2019-10-03 LAB — PHOSPHORUS: Phosphorus: 3 mg/dL (ref 2.5–4.6)

## 2019-10-03 MED ORDER — VANCOMYCIN VARIABLE DOSE PER UNSTABLE RENAL FUNCTION (PHARMACIST DOSING): Status: DC

## 2019-10-03 MED ORDER — HYDROCORTISONE NA SUCCINATE PF 100 MG IJ SOLR
50.0000 mg | Freq: Three times a day (TID) | INTRAMUSCULAR | Status: AC
  Administered 2019-10-03: 50 mg via INTRAVENOUS
  Filled 2019-10-03: qty 2

## 2019-10-03 MED ORDER — PIPERACILLIN-TAZOBACTAM IN DEX 2-0.25 GM/50ML IV SOLN
2.2500 g | Freq: Four times a day (QID) | INTRAVENOUS | Status: DC
  Administered 2019-10-03 – 2019-10-07 (×15): 2.25 g via INTRAVENOUS
  Filled 2019-10-03 (×16): qty 50

## 2019-10-03 NOTE — Progress Notes (Addendum)
  Speech Language Pathology Treatment: Dysphagia  Patient Details Name: Stephanie Olson MRN: [id number] DOB: 27-Mar-1941 Today's Date: 10/03/2019 Time: 1215-1226 SLP Time Calculation (min) (ACUTE ONLY): 11 min  Assessment / Plan / Recommendation Clinical Impression  PO trials offered including icecream *after pt indicated she would eat it via verbal "uh huh"*, jello and apple juice.  Pt turned her head away from SLP offering despite multiple offerings via multiple methods *pt holding cup, straw, tip of straw, tsp.    She does indicate via verbal "uh huh' and "nuh huh" for determination of pain and location.   Pt is holding the left side of her face and admits to pain at ear and left jaw. Denied pain in oral cavity but did not allow SLP to view.  Via "uh huh" she was able to identify her correct name, location - when she answer was "no" pt did not respond indicating some language skills.  At this time, this pt will not accept po intake and she has h/o aspiration into her airway in 2019 thus will follow up for po readiness.      HPI HPI: 79 yo female adm to [facility] from [place] with AMS-  metabolic encephalopathy due to pyelonephritis complicated by malfunctioning catheter - foley catheter was replaced.  Pt has not been appropriate to accept po intake and SlP was consulted.  [name] RN reports removing dentures for care was difficult due to pt's mentation.  Pt CT chest showed right more than left consolidations concerning for pna, ? COVID 19 per results.  Pt also with h/o DM - diet controlled, aspiration into airway in 04/2018, smoker, FTT.  Pt has h/o dysphagia with oral holding, delay in swallow with SLP up to 45 seconds with diet recommendations of liquids only during 04/2018 admit.  Today pt seen to determine readiness for diet.      SLP Plan  Continue with current plan of care       Recommendations  Medication Administration: Via alternative means                Oral Care Recommendations:  Oral care BID SLP Visit Diagnosis: Dysphagia, oropharyngeal phase (R13.12) Plan: Continue with current plan of care       GO                Macario Golds 10/03/2019, 12:40 PM  [name], MS [place] Office [phone]

## 2019-10-03 NOTE — Progress Notes (Signed)
[facility] for Infectious Disease    Date of Admission:  10/01/2019   Total days of antibiotics 3           ID: Stephanie Olson is a 79 y.o. female with  Polymicrobial sepsis from urinary source/pyelonephritis Principal Problem:   Severe sepsis ([place]) Active Problems:   Renal failure (ARF), acute on chronic (HCC)   Pressure injury of skin   Malnutrition of moderate degree   Lactic acidosis   Acute pyelonephritis   Hypotension   Hypokalemia   Chronic indwelling Foley catheter   Sinus tachycardia    Subjective: Afebrile, still withdrawn. Underwent TEE today  ROS: unable to obtain ROS due to dementia  Medications:  . Chlorhexidine Gluconate Cloth  6 each Topical Daily  . enoxaparin (LOVENOX) injection  30 mg Subcutaneous Q24H  . fludrocortisone  0.1 mg Oral Daily  . mouth rinse  15 mL Mouth Rinse BID  . senna-docusate  1 tablet Oral BID  . tamsulosin  0.4 mg Oral Daily    Objective: Vital signs in last 24 hours: Temp:  [97 F (36.1 C)-100.8 F (38.2 C)] 99.5 F (37.5 C) (01/26 1700) Pulse Rate:  [90-109] 102 (01/26 1700) Resp:  [10-37] 28 (01/26 1700) BP: (93-145)/(48-99) 142/94 (01/26 1600) SpO2:  [98 %-100 %] 99 % (01/26 1700) Physical Exam  Constitutional:  Opens eyes to verbal stimuli. appears well-developed and well-nourished. No distress.  HENT: [place]/AT, PERRLA, no scleral icterus Mouth/Throat: Oropharynx is clear and moist. No oropharyngeal exudate.  Cardiovascular: Normal rate, regular rhythm and normal heart sounds. Exam reveals no gallop and no friction rub.  No murmur heard.  Pulmonary/Chest: Effort normal and breath sounds normal. No respiratory distress.  has no wheezes.  Neck = supple, no nuchal rigidity Abdominal: Soft. Bowel sounds are decreased.  exhibits no distension. There is no tenderness.  Neurological: does not follow commands Skin: Skin is warm and dry. No rash noted. No erythema.    Lab Results Recent Labs    10/02/19 0545  10/03/19 0049  WBC 19.3* 22.2*  HGB 9.6* 9.5*  HCT 31.2* 30.4*  NA 140 142  K 4.9 4.8  CL 114* 115*  CO2 19* 18*  BUN 55* 60*  CREATININE 2.51* 2.56*   Liver Panel Recent Labs    10/01/19 1211 10/02/19 0545  PROT 5.4* 5.3*  ALBUMIN 2.4* 2.3*  AST 14* 30  ALT 9 10  ALKPHOS 72 30  BILITOT 0.6 0.7    Microbiology: 1/24 blood cx GNR and GPC (enterococcus by BCID) 1/24 urine cx: providencia and serratia Studies/Results: CT HEAD WO CONTRAST  Result Date: 10/03/2019 CLINICAL DATA:  Altered mental status EXAM: CT HEAD WITHOUT CONTRAST TECHNIQUE: Contiguous axial images were obtained from the base of the skull through the vertex without intravenous contrast. COMPARISON:  2019 FINDINGS: Brain: There is no acute intracranial hemorrhage, mass-effect, or edema. Gray-white differentiation is preserved. There is no extra-axial fluid collection. Confluent areas of hypoattenuation in the supratentorial white matter are nonspecific but probably reflects stable chronic microvascular ischemic changes. Prominence of the ventricles and sulci reflects stable parenchymal volume loss. 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 Vascular: There is atherosclerotic calcification at the skull base. Skull: Calvarium is unremarkable. Sinuses/Orbits: No acute finding. Other: None. IMPRESSION: No acute intracranial abnormality. Stable chronic findings detailed above. Electronically Signed   By: Macy Mis M.D.   On: 10/03/2019 10:45   ECHOCARDIOGRAM COMPLETE  Result Date: 10/03/2019   ECHOCARDIOGRAM REPORT   Patient Name:   Stephanie Olson [facility] Date of Exam: 10/03/2019 Medical Rec #:  [id number]        Height:       66.0 in Accession #:    [id number]       Weight:       149.3 lb Date of Birth:  08-Apr-1941        BSA:          1.77 m Patient Age:    64 years         BP:           103/52 mmHg Patient Gender: F                HR:           100 bpm.  Exam Location:  Inpatient Procedure: 2D Echo, Cardiac Doppler and Color Doppler Indications:    Bacteremia 790.7 / R78.81  History:        Patient has no prior history of Echocardiogram examinations.                 Risk Factors:Hypertension and Diabetes.  Sonographer:    Jonelle Sidle Dance Referring Phys: [id number] [place]  1. Left ventricular ejection fraction, by visual estimation, is 35 to 40%. The left ventricle has moderately decreased function. There is no left ventricular hypertrophy.  2. Severe hypokinesis of the left ventricular, entire apical segment.  3. Severe hypokinesis of the left ventricular, entire inferior wall and inferolateral wall.  4. Indeterminate diastolic filling due to E-A fusion.  5. The left ventricle demonstrates regional wall motion abnormalities.  6. Global right ventricle has normal systolic function.The right ventricular size is normal. No increase in right ventricular wall thickness.  7. Left atrial size was normal.  8. Right atrial size was normal.  9. Mild mitral annular calcification. 10. The mitral valve is normal in structure. Mild mitral valve regurgitation. 11. The tricuspid valve is normal in structure. 12. The tricuspid valve is normal in structure. Tricuspid valve regurgitation is not demonstrated. 13. The aortic valve is tricuspid. Aortic valve regurgitation is not visualized. Mild aortic valve sclerosis without stenosis. 14. The pulmonic valve was normal in structure. Pulmonic valve regurgitation is not visualized. FINDINGS  Left Ventricle: Left ventricular ejection fraction, by visual estimation, is 35 to 40%. The left ventricle has moderately decreased function. Severe hypokinesis of the left ventricular, entire inferior wall and inferolateral wall. Severe hypokinesis of the left ventricular, entire apical segment. The left ventricle demonstrates regional wall motion abnormalities. The left ventricular internal cavity size was the left ventricle is normal  in size. There is no left ventricular hypertrophy. Indeterminate diastolic filling due to E-A fusion. Right Ventricle: The right ventricular size is normal. No increase in right ventricular wall thickness. Global RV systolic function is has normal systolic function. Left Atrium: Left atrial size was normal in size. Right Atrium: Right atrial size was normal in size Pericardium: There is no evidence of pericardial effusion. Mitral Valve: The mitral valve is normal in structure. Mild mitral annular calcification. Mild mitral valve regurgitation, with centrally-directed jet. Tricuspid Valve: The tricuspid valve is normal in structure. Tricuspid valve regurgitation is not demonstrated. Aortic Valve: The aortic valve is tricuspid. . There is mild thickening and moderate calcification of the aortic valve. Aortic valve regurgitation is not visualized. Aortic regurgitation PHT measures 265 msec. Mild aortic valve sclerosis is present, with  no evidence of aortic  valve stenosis. There is mild thickening of the aortic valve. There is moderate calcification of the aortic valve. Pulmonic Valve: The pulmonic valve was normal in structure. Pulmonic valve regurgitation is not visualized. Pulmonic regurgitation is not visualized. Aorta: The aortic root and ascending aorta are structurally normal, with no evidence of dilitation. IAS/Shunts: No atrial level shunt detected by color flow Doppler.  LEFT VENTRICLE PLAX 2D LVIDd:         4.20 cm LVIDs:         3.30 cm LV PW:         1.20 cm LV IVS:        0.90 cm LVOT diam:     2.00 cm LV SV:         34 ml LV SV Index:   19.32 LVOT Area:     3.14 cm  RIGHT VENTRICLE            IVC RV Basal diam:  2.20 cm    IVC diam: 1.70 cm RV S prime:     8.27 cm/s TAPSE (M-mode): 1.1 cm LEFT ATRIUM             Index       RIGHT ATRIUM          Index LA diam:        3.70 cm 2.10 cm/m  RA Area:     8.05 cm LA Vol (A2C):   47.8 ml 27.07 ml/m RA Volume:   14.90 ml 8.44 ml/m LA Vol (A4C):   38.6 ml  21.86 ml/m LA Biplane Vol: 43.5 ml 24.63 ml/m  AORTIC VALVE LVOT Vmax:   72.15 cm/s LVOT Vmean:  45.550 cm/s LVOT VTI:    0.142 m AI PHT:      265 msec  AORTA Ao Root diam: 3.20 cm Ao Asc diam:  3.40 cm MITRAL VALVE MV Area (PHT): 7.66 cm             SHUNTS MV PHT:        28.71 msec           Systemic VTI:  0.14 m MV Decel Time: 99 msec              Systemic Diam: 2.00 cm MV E velocity: 135.00 cm/s 103 cm/s  Mihai Croitoru MD Electronically signed by Sanda Klein MD Signature Date/Time: 10/03/2019/10:28:12 AM    Final      Assessment/Plan: Polymicrobial bacteremia ( enterococcus plus proteus vs. Providencia) due to pyelonephritis = no hx of MDRO, will change abtx to piptazo to cover pathogens. Still awaiting sensitivities and ID of pathogen from blood cx. Repeat blood cx have been drawn today. If blood cx are NGTD, plan to treat for pyelonephritis with polymicrobial secondary bacteremia. TTE does not suggest any vegetation but this has lower sensitivity that TEE. Given dementia, not sure if she is a candidate to be TEE. Will see if she improves as her underlying infection is treated.   [facility] Cell: [phone] Pager: [phone]  10/03/2019, 5:36 PM

## 2019-10-03 NOTE — Progress Notes (Signed)
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  Reportedly she is alert and oriented x 4 at base line and was willing to transfer back to assisted living from nursing home in near future. In the emergency room, temperature 100.1.  Heart rate 114, respiratory 24, blood pressure 86/61.  On room air.  WBC count 14,000, procalcitonin 46, lactic acid 4.  Chest x-ray patchy bibasilar opacities, left greater than right.  CT abdomen pelvis with bilateral hydronephrosis with pyelonephritis and bladder distention.  Resuscitated, started as sepsis and admitted to stepdown unit.   Assessment & Plan:   Principal Problem:   Severe sepsis ([place]) Active Problems:   Renal failure (ARF), acute on chronic (HCC)   Pressure injury of skin   Malnutrition of moderate degree   Lactic acidosis   Acute pyelonephritis   Hypotension   Hypokalemia   Chronic indwelling Foley catheter   Sinus tachycardia  Severe sepsis present on admission: Most likely from urinary source. Blood cultures with Enterococcus and gram-negative and urine culture with Proteus. Continue aggressive treatments including broad-spectrum antibiotics until final culture data available. Resuscitated with isotonic fluid, blood pressure responded, lactic acid improving, continue maintenance IV fluids. Foley catheter was exchanged and now draining.  Continue intake and output monitoring. On a stress dose of steroids, will continue until blood pressure responds well then de-escalate to home dose. Repeat cultures drawn, 1/26. Echocardiogram today.  If nondiagnostic,  transesophageal echo. Leukocytosis could be from the very high dose of steroids, will gradually taper down.  Acute metabolic encephalopathy: Severe and persistent.  No focal deficit.  Remains nonverbal and difficult to arouse.  Continue treatment for infection.  Will need continue close monitoring neurochecks and a stepdown unit. We will check a CT head today.  Hypokalemia: Replaced.  Adequate today.  Acute kidney injury on chronic kidney disease stage IV: Previous available creatinine of 2 in the system.  With obstructive uropathy and chronic indwelling catheter.  Resuscitated with fluid.  Continue close monitoring.  Making adequate urine.  Creatinine 2.5 today.  Multifocal pneumonia: No respiratory symptoms.  COVID-19 point-of-care test negative, PCR negative.  Broad-spectrum antibiotic should cover it.  N.p.o. until patient is more awake.  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  Draining well.   DVT prophylaxis: Lovenox subcu Code Status: Full code Family Communication: Patient's son, Mr. Philipp Ovens, 10/02/2019.  Will call. Disposition Plan: patient is from skilled nursing facility. Anticipated DC to skilled nursing facility, Barriers to discharge active treatment for sepsis.   Consultants:   None  Procedures:   None  Antimicrobials:  Anti-infectives (From admission, onward)   Start     Dose/Rate Route Frequency Ordered Stop   10/03/19 2000  vancomycin (VANCOCIN) IVPB 1000 mg/200 mL premix     1,000 mg 200 mL/hr over 60 Minutes Intravenous Every 48 hours 10/02/19 0830     10/02/19 1200  ceFEPIme (MAXIPIME) 2 g in sodium chloride 0.9 % 100 mL IVPB     2 g 200 mL/hr over 30 Minutes Intravenous Every 24 hours 10/01/19 1605     10/01/19 1615  vancomycin (VANCOREADY) IVPB 1500 mg/300 mL     1,500 mg 150 mL/hr over 120  Minutes Intravenous STAT 10/01/19 1606 10/01/19 1915   10/01/19 1607  vancomycin variable dose per unstable renal function (pharmacist  dosing)  Status:  Discontinued      Does not apply See admin instructions 10/01/19 1607 10/02/19 0830   10/01/19 1245  ceFEPIme (MAXIPIME) 2 g in sodium chloride 0.9 % 100 mL IVPB     2 g 200 mL/hr over 30 Minutes Intravenous  Once 10/01/19 1233 10/01/19 1317   10/01/19 1245  metroNIDAZOLE (FLAGYL) IVPB 500 mg     500 mg 100 mL/hr over 60 Minutes Intravenous  Once 10/01/19 1233 10/01/19 1344         Subjective: Patient seen and examined.  No overnight events.  Afebrile last 24 hours.  Still very lethargic and unable to talk.  She is somehow opening her eyes to stimulation.  Resistant movement on the extremities.  Objective: Vitals:   10/03/19 0600 10/03/19 0700 10/03/19 0723 10/03/19 0800  BP: 108/62 123/75  (!) 103/52  Pulse: 94 94 (!) 101 96  Resp: 11 20 18  (!) 29  Temp: 99.1 F (37.3 C) 98.4 F (36.9 C) (!) 97.2 F (36.2 C) 97.9 F (36.6 C)  TempSrc:      SpO2: 99% 100% 98% 100%  Weight:      Height:        Intake/Output Summary (Last 24 hours) at 10/03/2019 0824 Last data filed at 10/03/2019 0600 Gross per 24 hour  Intake 2594.69 ml  Output 1300 ml  Net 1294.69 ml   Filed Weights   10/01/19 1234 10/01/19 1805  Weight: 72.2 kg 67.7 kg    Examination:  General exam: Appears calm and comfortable on room air.  Chronically sick looking.  Lethargic and obtunded. Respiratory system: Clear to auscultation. Respiratory effort normal.  No added sounds. Cardiovascular system: S1 & S2 heard, RRR. No pedal edema. Gastrointestinal system: Abdomen is nondistended, soft and nontender. No organomegaly or masses felt. Normal bowel sounds heard. No suprapubic tenderness or mass. Central nervous system: Sleepy and lethargic.  Weakness of all extremities.  No focal deficit.  She resists movement of the extremities. Skin: No rashes, lesions or ulcers Psychiatry: Judgement and insight appear compromised.   Mood & affect flat.    Data Reviewed: I have personally reviewed following  labs and imaging studies  CBC: Recent Labs  Lab 10/01/19 1211 10/01/19 1840 10/02/19 0545 10/03/19 0049  WBC 14.4* 20.7* 19.3* 22.2*  NEUTROABS 13.3*  --   --  18.9*  HGB 10.1* 9.6* 9.6* 9.5*  HCT 33.5* 31.1* 31.2* 30.4*  MCV 93.6 93.4 92.9 91.8  PLT 199 157 129* 0000000*   Basic Metabolic Panel: Recent Labs  Lab 10/01/19 1211 10/01/19 1840 10/01/19 2200 10/02/19 0545 10/03/19 0049  NA 142  --  142 140 142  K 2.9*  --  4.9 4.9 4.8  CL 110  --  112* 114* 115*  CO2 17*  --  17* 19* 18*  GLUCOSE 198*  --  208* 158* 139*  BUN 48*  --  52* 55* 60*  CREATININE 2.51* 2.93* 2.61* 2.51* 2.56*  CALCIUM 7.0*  --  7.6* 7.8* 8.3*  MG  --   --   --  1.8 1.9  PHOS  --   --   --   --  3.0   GFR: Estimated Creatinine Clearance: 17.2 mL/min (A) (by C-G formula based on SCr of 2.56 mg/dL (H)). Liver Function Tests: Recent Labs  Lab 10/01/19 1211 10/02/19 0545  AST 14*  30  ALT 9 10  ALKPHOS 72 58  BILITOT 0.6 0.7  PROT 5.4* 5.3*  ALBUMIN 2.4* 2.3*   No results for input(s): LIPASE, AMYLASE in the last 168 hours. No results for input(s): AMMONIA in the last 168 hours. Coagulation Profile: Recent Labs  Lab 10/01/19 1840  INR 2.0*   Cardiac Enzymes: No results for input(s): CKTOTAL, CKMB, CKMBINDEX, TROPONINI in the last 168 hours. BNP (last 3 results) No results for input(s): PROBNP in the last 8760 hours. HbA1C: No results for input(s): HGBA1C in the last 72 hours. CBG: Recent Labs  Lab 10/01/19 1129  GLUCAP 204*   Lipid Profile: No results for input(s): CHOL, HDL, LDLCALC, TRIG, CHOLHDL, LDLDIRECT in the last 72 hours. Thyroid Function Tests: Recent Labs    10/01/19 1840  TSH 0.836   Anemia Panel: No results for input(s): VITAMINB12, FOLATE, FERRITIN, TIBC, IRON, RETICCTPCT in the last 72 hours. Sepsis Labs: Recent Labs  Lab 10/01/19 1840 10/01/19 2200 10/02/19 0229 10/02/19 0545 10/03/19 0049  PROCALCITON 46.82  --   --  68.27 54.86  LATICACIDVEN 4.4*  3.7* 2.1* 1.5  --     Recent Results (from the past 240 hour(s))  Blood Culture (routine x 2)     Status: None (Preliminary result)   Collection Time: 10/01/19 12:12 PM   Specimen: BLOOD  Result Value Ref Range Status   Specimen Description   Final    BLOOD RIGHT WRIST Performed at [facility] [address], [place], [place] [address]    Special Requests   Final    BOTTLES DRAWN AEROBIC AND ANAEROBIC Blood Culture results may not be optimal due to an inadequate volume of blood received in culture bottles Performed at [facility] [address] [place], [place] [address]    Culture  Setup Time   Final    GRAM POSITIVE COCCI GRAM NEGATIVE RODS CRITICAL VALUE NOTED.  VALUE IS CONSISTENT WITH PREVIOUSLY REPORTED AND CALLED VALUE. Performed at [facility], [place] [address], [place], [place] [address]    Culture GRAM POSITIVE COCCI GRAM NEGATIVE RODS   Final   Report Status PENDING  Incomplete  Blood Culture (routine x 2)     Status: None (Preliminary result)   Collection Time: 10/01/19 12:13 PM   Specimen: BLOOD LEFT FOREARM  Result Value Ref Range Status   Specimen Description   Final    BLOOD LEFT FOREARM Performed at [facility] [address] [place], [place] [address]    Special Requests   Final    BOTTLES DRAWN AEROBIC AND ANAEROBIC Blood Culture results may not be optimal due to an inadequate volume of blood received in culture bottles Performed at [facility] [address], [place], [place] [address]    Culture  Setup Time   Final    Organism ID to follow GRAM POSITIVE COCCI GRAM NEGATIVE RODS IN BOTH AEROBIC AND ANAEROBIC BOTTLES CRITICAL RESULT CALLED TO, READ BACK BY AND VERIFIED WITH: PHARMD A. PHAM 0840 [id number] FCP Performed at [facility] [address] [place], [place] [address]    Culture GRAM POSITIVE COCCI GRAM NEGATIVE RODS   Final   Report Status PENDING   Incomplete  Blood Culture ID Panel (Reflexed)     Status: Abnormal   Collection Time: 10/01/19 12:13 PM  Result Value Ref Range Status   Enterococcus species DETECTED (A) NOT DETECTED Final    Comment: CRITICAL RESULT CALLED  TO, READ BACK BY AND VERIFIED WITH: PHARMD A. PHAM 0840 [id number] FCP    Vancomycin resistance NOT DETECTED NOT DETECTED Final   Listeria monocytogenes NOT DETECTED NOT DETECTED Final   Staphylococcus species NOT DETECTED NOT DETECTED Final   Staphylococcus aureus (BCID) NOT DETECTED NOT DETECTED Final   Streptococcus species NOT DETECTED NOT DETECTED Final   Streptococcus agalactiae NOT DETECTED NOT DETECTED Final   Streptococcus pneumoniae NOT DETECTED NOT DETECTED Final   Streptococcus pyogenes NOT DETECTED NOT DETECTED Final   Acinetobacter baumannii NOT DETECTED NOT DETECTED Final   Enterobacteriaceae species DETECTED (A) NOT DETECTED Final    Comment: Enterobacteriaceae represent a large family of gram-negative bacteria, not a single organism. CRITICAL RESULT CALLED TO, READ BACK BY AND VERIFIED WITH: PHARMD A. PHAM 0840 [id number] FCP    Enterobacter cloacae complex NOT DETECTED NOT DETECTED Final   Escherichia coli NOT DETECTED NOT DETECTED Final   Klebsiella oxytoca NOT DETECTED NOT DETECTED Final   Klebsiella pneumoniae NOT DETECTED NOT DETECTED Final   Proteus species DETECTED (A) NOT DETECTED Final    Comment: CRITICAL RESULT CALLED TO, READ BACK BY AND VERIFIED WITH: PHARMD A. PHAM 0840 [id number] FCP    Serratia marcescens NOT DETECTED NOT DETECTED Final   Carbapenem resistance NOT DETECTED NOT DETECTED Final   Haemophilus influenzae NOT DETECTED NOT DETECTED Final   Neisseria meningitidis NOT DETECTED NOT DETECTED Final   Pseudomonas aeruginosa NOT DETECTED NOT DETECTED Final   Candida albicans NOT DETECTED NOT DETECTED Final   Candida glabrata NOT DETECTED NOT DETECTED Final   Candida krusei NOT DETECTED NOT DETECTED Final   Candida parapsilosis NOT  DETECTED NOT DETECTED Final   Candida tropicalis NOT DETECTED NOT DETECTED Final    Comment: Performed at [facility], [place] [address], [place], [place] [address]  Urine culture     Status: Abnormal (Preliminary result)   Collection Time: 10/01/19  2:27 PM   Specimen: In/Out Cath Urine  Result Value Ref Range Status   Specimen Description   Final    IN/OUT CATH URINE Performed at [facility] [address] [place], [place] [address]    Special Requests   Final    NONE Performed at [facility], [place] [address] [place], [place] [address]    Culture (A)  Final    >=100,000 COLONIES/mL PROTEUS MIRABILIS CULTURE REINCUBATED FOR BETTER GROWTH Performed at [facility], [place] [address], [place], [place] [address]    Report Status PENDING  Incomplete  Respiratory Panel by RT PCR (Flu A&B, Covid) - Urine, Catheterized     Status: None   Collection Time: 10/01/19  2:27 PM   Specimen: Urine, Catheterized  Result Value Ref Range Status   SARS Coronavirus 2 by RT PCR NEGATIVE NEGATIVE Final    Comment: (NOTE) SARS-CoV-2 target nucleic acids are NOT DETECTED. The SARS-CoV-2 RNA is generally detectable in upper respiratoy specimens during the acute phase of infection. The lowest concentration of SARS-CoV-2 viral copies this assay can detect is 131 copies/mL. A negative result does not preclude SARS-Cov-2 infection and should not be used as the sole basis for treatment or other patient management decisions. A negative result may occur with  improper specimen collection/handling, submission of specimen other than nasopharyngeal swab, presence of viral mutation(s) within the areas targeted by this assay, and inadequate number of viral copies (<131 copies/mL). A negative result must be combined with clinical observations, patient history, and epidemiological information. The  expected result is Negative. Fact Sheet for Patients:    PinkCheek.be Fact Sheet for Healthcare Providers:  GravelBags.it This test is not yet ap proved or cleared by the [place] FDA and  has been authorized for detection and/or diagnosis of SARS-CoV-2 by FDA under an Emergency Use Authorization (EUA). This EUA will remain  in effect (meaning this test can be used) for the duration of the COVID-19 declaration under Section 564(b)(1) of the Act, 21 U.S.C. section 360bbb-3(b)(1), unless the authorization is terminated or revoked sooner.    Influenza A by PCR NEGATIVE NEGATIVE Final   Influenza B by PCR NEGATIVE NEGATIVE Final    Comment: (NOTE) The Xpert Xpress SARS-CoV-2/FLU/RSV assay is intended as an aid in  the diagnosis of influenza from Nasopharyngeal swab specimens and  should not be used as a sole basis for treatment. Nasal washings and  aspirates are unacceptable for Xpert Xpress SARS-CoV-2/FLU/RSV  testing. Fact Sheet for Patients: PinkCheek.be Fact Sheet for Healthcare Providers: GravelBags.it This test is not yet approved or cleared by the [place] FDA and  has been authorized for detection and/or diagnosis of SARS-CoV-2 by  FDA under an Emergency Use Authorization (EUA). This EUA will remain  in effect (meaning this test can be used) for the duration of the  Covid-19 declaration under Section 564(b)(1) of the Act, 21  U.S.C. section 360bbb-3(b)(1), unless the authorization is  terminated or revoked. Performed at [facility], [place] [address] [place], [place] [address]          Radiology Studies: CT ABDOMEN PELVIS WO CONTRAST  Result Date: 10/01/2019 CLINICAL DATA:  Sepsis, altered mental status, abdominal pain. EXAM: CT ABDOMEN AND PELVIS WITHOUT CONTRAST TECHNIQUE: Multidetector CT imaging of the abdomen and pelvis was performed following the standard protocol without IV  contrast. COMPARISON:  CT abdomen dated 04/23/2019. FINDINGS: Lower chest: Patchy bibasilar consolidations, LEFT greater than RIGHT. Hepatobiliary: No focal liver abnormality is seen. Gallbladder is again moderately distended containing small layering gallstones. No pericholecystic fluid. No obvious bile duct dilatation. Pancreas: Unremarkable. No pancreatic ductal dilatation or surrounding inflammatory changes. Spleen: Normal in size without focal abnormality. Adrenals/Urinary Tract: Bilateral hydronephrosis, moderate in degree, RIGHT greater than LEFT, with associated perinephric and periureteral fluid stranding bilaterally. Bladder is distended. Foley catheter is present within the bladder. Stomach/Bowel: Moderate-sized stool ball in the rectal vault. No dilated large or small bowel loops. No evidence of bowel wall inflammation. Stomach is unremarkable, partially decompressed. Vascular/Lymphatic: Aortic atherosclerosis. No enlarged lymph nodes seen in the abdomen or pelvis. Reproductive: No adnexal mass seen. Other: No abscess collection or free intraperitoneal air. Musculoskeletal: Degenerative spondylosis throughout the slightly scoliotic thoracolumbar spine. No acute or suspicious osseous finding. IMPRESSION: 1. Bilateral hydronephrosis, moderate in degree, RIGHT greater than LEFT, with associated perinephric and periureteral fluid stranding bilaterally. Bladder is distended despite the presence of a Foley catheter within the bladder. Findings are suspicious for bladder outlet obstruction causing the bilateral hydronephrosis. Given the degree of perinephric and periureteral inflammation/fluid stranding, this is likely acute and and associated ascending ureteral infection and/or pyelonephritis cannot be excluded. 2. Patchy bibasilar consolidations, LEFT greater than RIGHT, suspicious for multifocal pneumonia. COVID-19? 3. Cholelithiasis without evidence of acute cholecystitis. 4. Moderate-sized stool ball in  the rectal vault. No bowel obstruction. Electronically Signed   By: Franki Cabot M.D.   On: 10/01/2019 14:06   DG Chest Port 1 View  Result Date: 10/01/2019 CLINICAL DATA:  Altered mental status, diabetes mellitus, hypertension, smoker EXAM: PORTABLE  CHEST 1 VIEW COMPARISON:  Portable exam 1229 hours compared to 04/23/2018 FINDINGS: Normal heart size, mediastinal contours, and pulmonary vascularity. Mild atelectasis versus infiltrate at LEFT base. Remaining lungs clear. No pleural effusion or pneumothorax. Bones demineralized with chronic RIGHT rotator cuff tear noted. IMPRESSION: Mild atelectasis versus infiltrate at LEFT base. Electronically Signed   By: Lavonia Dana M.D.   On: 10/01/2019 12:45        Scheduled Meds: . Chlorhexidine Gluconate Cloth  6 each Topical Daily  . enoxaparin (LOVENOX) injection  30 mg Subcutaneous Q24H  . fludrocortisone  0.1 mg Oral Daily  . hydrocortisone sod succinate (SOLU-CORTEF) inj  100 mg Intravenous Q8H  . mouth rinse  15 mL Mouth Rinse BID  . senna-docusate  1 tablet Oral BID  . tamsulosin  0.4 mg Oral Daily   Continuous Infusions: . ceFEPime (MAXIPIME) IV Stopped (10/02/19 1159)  . lactated ringers 125 mL/hr at 10/03/19 0500  . vancomycin       LOS: 2 days    Time spent: 40 minutes    Barb Merino, MD [facility] Pager [phone]

## 2019-10-03 NOTE — Progress Notes (Signed)
  Echocardiogram 2D Echocardiogram has been performed.  Stephanie Olson [name] 10/03/2019, 9:20 AM

## 2019-10-03 NOTE — Progress Notes (Signed)
Pharmacy Antibiotic Note  Stephanie Olson is a 79 y.o. female admitted on 10/01/2019 with septic shock secondary to acute bilateral pyelonephritis complicated by nonfunctioning chronic indwelling Foley catheter. Pharmacy has been consulted for Zosyn  Today, 10/03/19  D3 Antibiotics  WBC remains elevated, some improvement from 1/25  SCr elevated, essentially unchanged  Cultures but BCID with Enterococcus and Proteus spp.  Urine cx with proteus and providencia  Plan:  Change vancomycin and cefepime to zosyn 2.25gm IV q6h over 16min infusion  Await final culture data   Monitor renal function, cultures, clinical course.   Height: 5\' 6"  (167.6 cm) Weight: 149 lb 4 oz (67.7 kg) IBW/kg (Calculated) : 59.3  Temp (24hrs), Avg:99.2 F (37.3 C), Min:97 F (36.1 C), Max:100.8 F (38.2 C)  Recent Labs  Lab 10/01/19 1211 10/01/19 1211 10/01/19 1407 10/01/19 1840 10/01/19 2200 10/02/19 0229 10/02/19 0545 10/03/19 0049  WBC 14.4*  --   --  20.7*  --   --  19.3* 22.2*  CREATININE 2.51*  --   --  2.93* 2.61*  --  2.51* 2.56*  LATICACIDVEN 4.0*   < > 3.3* 4.4* 3.7* 2.1* 1.5  --    < > = values in this interval not displayed.    Estimated Creatinine Clearance: 17.2 mL/min (A) (by C-G formula based on SCr of 2.56 mg/dL (H)).    No Known Allergies  Antimicrobials this admission: 1/24 Metronidazole x 1 1/24 Vancomycin >> 1/26 1/24 Cefepime >> 1/26 1/26 Zosyn >>   Dose adjustments/drug levels this admission:    Microbiology results: 1/24 BCx: per BCID 4/4 bottles - enterococcus and proteus 1/24 UCx: >100k proteus and providencia 1/24 COVID: negative 1/24 Influenza A/B: negative 1/26 repeat BCx: pending  Thank you for allowing pharmacy to be a part of this patient's care.   Doreene Eland, PharmD, BCPS.   Work Cell: [phone] 10/03/2019 2:29 PM

## 2019-10-04 ENCOUNTER — Inpatient Hospital Stay (HOSPITAL_COMMUNITY): Payer: Medicare Other

## 2019-10-04 ENCOUNTER — Other Ambulatory Visit: Payer: Self-pay

## 2019-10-04 LAB — CBC WITH DIFFERENTIAL/PLATELET
Abs Immature Granulocytes: 0.24 10*3/uL — ABNORMAL HIGH (ref 0.00–0.07)
Basophils Absolute: 0.1 10*3/uL (ref 0.0–0.1)
Basophils Relative: 0 %
Eosinophils Absolute: 0 10*3/uL (ref 0.0–0.5)
Eosinophils Relative: 0 %
HCT: 30.9 % — ABNORMAL LOW (ref 36.0–46.0)
Hemoglobin: 9.7 g/dL — ABNORMAL LOW (ref 12.0–15.0)
Immature Granulocytes: 1 %
Lymphocytes Relative: 11 %
Lymphs Abs: 2.4 10*3/uL (ref 0.7–4.0)
MCH: 28.2 pg (ref 26.0–34.0)
MCHC: 31.4 g/dL (ref 30.0–36.0)
MCV: 89.8 fL (ref 80.0–100.0)
Monocytes Absolute: 1 10*3/uL (ref 0.1–1.0)
Monocytes Relative: 5 %
Neutro Abs: 17.6 10*3/uL — ABNORMAL HIGH (ref 1.7–7.7)
Neutrophils Relative %: 83 %
Platelets: 125 10*3/uL — ABNORMAL LOW (ref 150–400)
RBC: 3.44 MIL/uL — ABNORMAL LOW (ref 3.87–5.11)
RDW: 15.9 % — ABNORMAL HIGH (ref 11.5–15.5)
WBC: 21.3 10*3/uL — ABNORMAL HIGH (ref 4.0–10.5)
nRBC: 0 % (ref 0.0–0.2)

## 2019-10-04 LAB — BASIC METABOLIC PANEL
Anion gap: 10 (ref 5–15)
BUN: 57 mg/dL — ABNORMAL HIGH (ref 8–23)
CO2: 19 mmol/L — ABNORMAL LOW (ref 22–32)
Calcium: 8.2 mg/dL — ABNORMAL LOW (ref 8.9–10.3)
Chloride: 115 mmol/L — ABNORMAL HIGH (ref 98–111)
Creatinine, Ser: 2.27 mg/dL — ABNORMAL HIGH (ref 0.44–1.00)
GFR calc Af Amer: 23 mL/min — ABNORMAL LOW (ref >60)
GFR calc non Af Amer: 20 mL/min — ABNORMAL LOW (ref >60)
Glucose, Bld: 115 mg/dL — ABNORMAL HIGH (ref 70–99)
Potassium: 4.3 mmol/L (ref 3.5–5.1)
Sodium: 144 mmol/L (ref 135–145)

## 2019-10-04 LAB — MRSA PCR SCREENING: MRSA by PCR: NEGATIVE

## 2019-10-04 MED ORDER — DULOXETINE HCL 20 MG PO CPEP
20.0000 mg | ORAL_CAPSULE | Freq: Every day | ORAL | Status: DC
  Administered 2019-10-04 – 2019-10-14 (×10): 20 mg via ORAL
  Filled 2019-10-04 (×12): qty 1

## 2019-10-04 MED ORDER — HALOPERIDOL LACTATE 5 MG/ML IJ SOLN: 2.0000 mg | Freq: Four times a day (QID) | INTRAMUSCULAR | Status: DC | PRN

## 2019-10-04 MED ORDER — FUROSEMIDE 10 MG/ML IJ SOLN
40.0000 mg | Freq: Once | INTRAMUSCULAR | Status: AC
  Administered 2019-10-04: 14:00:00 40 mg via INTRAVENOUS
  Filled 2019-10-04: qty 4

## 2019-10-04 MED ORDER — HALOPERIDOL LACTATE 5 MG/ML IJ SOLN
2.0000 mg | Freq: Once | INTRAMUSCULAR | Status: AC
  Administered 2019-10-04: 2 mg via INTRAVENOUS
  Filled 2019-10-04: qty 1

## 2019-10-04 MED ORDER — METHYLPREDNISOLONE SODIUM SUCC 40 MG IJ SOLR
40.0000 mg | Freq: Two times a day (BID) | INTRAMUSCULAR | Status: DC
  Administered 2019-10-04 – 2019-10-05 (×4): 40 mg via INTRAVENOUS
  Filled 2019-10-04 (×5): qty 1

## 2019-10-04 MED ORDER — MORPHINE SULFATE (PF) 2 MG/ML IV SOLN
1.0000 mg | Freq: Once | INTRAVENOUS | Status: AC
  Administered 2019-10-04: 1 mg via INTRAVENOUS
  Filled 2019-10-04: qty 1

## 2019-10-04 NOTE — TOC Initial Note (Signed)
Transition of Care [facility]) - Initial/Assessment Note    Patient Details  Name: Stephanie Olson MRN: [id number] Date of Birth: 1941/03/05  Transition of [facility]) CM/SW Contact:    Ross Ludwig, LCSW Phone Number: 10/04/2019, 4:46 PM  Clinical Narrative:                  Patient is a 79 year old female who is alert and oriented x1.  Patient is a long term care resident at [facility].  Patient has been there for an extended period of time.  CSW spoke to [facility] and patient should be able to return once she is medically ready for discharge.  CSW to continue to follow patient's progress throughout discharge planninng.   Expected Discharge Plan: [place] Barriers to Discharge: Continued Medical Work up   Patient Goals and CMS Choice Patient states their goals for this hospitalization and ongoing recovery are:: To return back to SNF      Expected Discharge Plan and Services Expected Discharge Plan: [place]   Discharge Planning Services: NA   Living arrangements for the past 2 months: [name]                   DME Agency: NA       HH Arranged: NA          Prior Living Arrangements/Services Living arrangements for the past 2 months: [place] Lives with:: Facility Resident Patient language and need for interpreter reviewed:: Yes Do you feel safe going back to the place where you live?: Yes      Need for Family Participation in Patient Care: Yes (Comment) Care giver support system in place?: Yes (comment)   Criminal Activity/Legal Involvement Pertinent to Current Situation/Hospitalization: No - Comment as needed  Activities of Daily Living Home Assistive Devices/Equipment: Blood pressure cuff, Dentures (specify type), Eyeglasses, Hospital bed, [employer], Grab bars around toilet, Grab bars in shower, Hand-held shower hose, Scales, Nebulizer, Oxygen, Walker (specify type), Wheelchair(front wheeled  walker, upper/lower dentures-[facility] has necessary equipment for their residents) ADL Screening (condition at time of admission) Patient's cognitive ability adequate to safely complete daily activities?: No(patient very drowsy and non verbal) Is the patient deaf or have difficulty hearing?: Yes Does the patient have difficulty seeing, even when wearing glasses/contacts?: No Does the patient have difficulty concentrating, remembering, or making decisions?: Yes Patient able to express need for assistance with ADLs?: No Does the patient have difficulty dressing or bathing?: Yes Independently performs ADLs?: No Communication: Independent Dressing (OT): Dependent Is this a change from baseline?: Change from baseline, expected to last >3 days Grooming: Dependent Is this a change from baseline?: Change from baseline, expected to last >3 days Feeding: Dependent Is this a change from baseline?: Change from baseline, expected to last >3 days Bathing: Dependent Is this a change from baseline?: Change from baseline, expected to last >3 days Toileting: Dependent Is this a change from baseline?: Change from baseline, expected to last >3days In/Out Bed: Dependent Is this a change from baseline?: Change from baseline, expected to last >3 days Walks in Home: Dependent Is this a change from baseline?: Change from baseline, expected to last >3 days Does the patient have difficulty walking or climbing stairs?: Yes(secondary to weakness) Weakness of Legs: Both Weakness of Arms/Hands: Both  Permission Sought/Granted Permission sought to share information with : Facility Sport and exercise psychologist, Family Supports Permission granted to share information with : Yes, Release of  Information Signed  Share Information with NAME: T[place]en D[place] [phone]  [phone]  Permission granted to share info w AGENCY: SNF admissions        Emotional Assessment Appearance:: Appears stated age   Affect  (typically observed): Accepting, Appropriate, Stable Orientation: : Oriented to Self Alcohol / Substance Use: Not Applicable Psych Involvement: No (comment)  Admission diagnosis:  Hypokalemia [E87.6] Delirium [R41.0] Pyelonephritis [N12] AKI (acute kidney injury) ([place]) [N17.9] Severe sepsis ([place]) [A41.9, R65.20] Septic shock due to urinary tract infection ([place]) [A41.9, R65.21, N39.0] Patient Active Problem List   Diagnosis Date Noted  . Severe sepsis ([place]) 10/01/2019  . Acute pyelonephritis 10/01/2019  . Hypotension 10/01/2019  . Hypokalemia 10/01/2019  . Chronic indwelling Foley catheter 10/01/2019  . Sinus tachycardia 10/01/2019  . Abnormal liver function 05/27/2019  . Diet-controlled diabetes mellitus ([place])   . Adrenal insufficiency ([place])   . Lactic acidosis   . Acute lower UTI 04/22/2019  . Metabolic acidosis [id number]  . Malnutrition of moderate degree 04/25/2018  . Pressure injury of skin 04/23/2018  . ARF (acute renal failure) ([place])   . Acute urinary retention   . AKI (acute kidney injury) ([place])   . Renal failure (ARF), acute on chronic (HCC) 04/22/2018  . Essential hypertension 04/22/2018  . Anemia 04/22/2018  . Metabolic acidosis, normal anion gap (NAG) 04/22/2018  . Hyperkalemia 04/22/2018  . Aspiration into airway 04/22/2018  . Generalized weakness 04/22/2018  . Lethargy 04/22/2018  . Poor appetite 04/22/2018  . FTT (failure to thrive) in adult 04/22/2018  . Diabetic neuropathy ([place]) 11/21/2015  . 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/2015  . Diabetic ulcer of foot, limited to breakdown of skin ([place]) 11/21/2015  . Foot pain, left 02/14/2015  . PVD (peripheral vascular disease) ([place]) 01/29/2015   PCP:  Benito Mccreedy, MD Pharmacy:  No Pharmacies Listed    Social Determinants of Health (SDOH) Interventions    Readmission Risk Interventions Readmission Risk Prevention Plan 10/04/2019  Transportation Screening Complete  Medication Review (RN Care Manager)  Referral to Pharmacy  PCP or Specialist appointment within 3-5 days of discharge Complete  HRI or [place] Not Complete  HRI or Home Care Consult Pt Refusal Comments Patient is long term care at [place] Complete  Some recent data might be hidden

## 2019-10-04 NOTE — Progress Notes (Signed)
  Speech Language Pathology Treatment: Dysphagia  Patient Details Name: Stephanie Olson MRN: [id number] DOB: 14-May-1941 Today's Date: 10/04/2019 Time: [id number] SLP Time Calculation (min) (ACUTE ONLY): 11 min  Assessment / Plan / Recommendation Clinical Impression  Today pt appears with ? mild facial asymmetry - less evident with HOB elevated; She smiled appropriately to SLP *suspect following nonverbal cues* and only verbalized approximations to "uh huh", "un uh".  Today increased work of breathing with ? congestion in proximal airway.  She did allow SLP to place small amount of icecream in her oral cavity, however she did not orally transit or manipulate within 10 seconds with total verbal cues to swallow. Given her lethargy *nodding off during session and increased congestion - orally suctioned within a short amount of time and ceased po trials.  Would recommend npo -x offering very small bites of popsicle for enjoyment and oral hygiene.  Would not recommend medication with water - if must try po medicine would try with small amount of applesauce and be prepared to orally suction if pt does not swallow.  Will continue to follow for po readiness.  If pt will allow, oral care advised to help to decrease possible bacterial load of saliva.  Unless pt makes significant improvement tomorrow, SLP will follow up Friday.  If family desires full treatment - ? If pt will need alternative means of nutrition if MD deems would be beneficial.    HPI HPI: 79 yo female adm to [facility] from [place] with AMS-  metabolic encephalopathy due to pyelonephritis complicated by malfunctioning catheter - foley catheter was replaced.  Pt has not been appropriate to accept po intake and SlP was consulted.  [name] RN reports removing dentures for care was difficult due to pt's mentation.  Pt CT chest showed right more than left consolidations concerning for pna, ? COVID 19 per results.  Pt also with h/o DM - diet controlled, aspiration  into airway in 04/2018, smoker, FTT.  Pt has h/o dysphagia with oral holding, delay in swallow with SLP up to 45 seconds with diet recommendations of liquids only during 04/2018 admit.  Today pt seen to determine readiness for diet. RN reports pt aspirated this am when taking her po medications with water. Now is congested and appears with increased facial asymmetry per RN.      SLP Plan  Continue with current plan of care       Recommendations  Diet recommendations: NPO Medication Administration: Via alternative means                Oral Care Recommendations: Oral care BID(as pt allows) Follow up Recommendations: Skilled Nursing facility SLP Visit Diagnosis: Dysphagia, oropharyngeal phase (R13.12) Plan: Continue with current plan of care       GO                Macario Golds 10/04/2019, 5:20 PM  [name], MS [place] Office [phone]

## 2019-10-04 NOTE — Progress Notes (Signed)
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  Reportedly she is alert and oriented x 4 at base line and was willing to transfer back to assisted living from nursing home in near future. In the emergency room, temperature 100.1.  Heart rate 114, respiratory 24, blood pressure 86/61.  On room air.  WBC count 14,000, procalcitonin 46, lactic acid 4.  Chest x-ray patchy bibasilar opacities, left greater than right.  CT abdomen pelvis with bilateral hydronephrosis with pyelonephritis and bladder distention.  Resuscitated, started on sepsis protocol and admitted to stepdown unit.   Assessment & Plan:   Principal Problem:   Severe sepsis ([place]) Active Problems:   Renal failure (ARF), acute on chronic (HCC)   Pressure injury of skin   Malnutrition of moderate degree   Lactic acidosis   Acute pyelonephritis   Hypotension   Hypokalemia   Chronic indwelling Foley catheter   Sinus tachycardia  Severe sepsis present on admission: Most likely from urinary source.  Also has multifocal pneumonia. Blood cultures with Enterococcus and gram-negative and urine culture with Proteus and providencia. Treated with broad-spectrum antibiotics, now deescalated to Zosyn.  Final cultures are pending.   Blood pressures and lactic acid improving.   Foley catheter was exchanged and now draining well.  Creatinine trending down.   Unable to take oral medications, will continue on low-dose IV steroids.   Echocardiogram did not show any obvious vegetations.  Patient is not a good candidate for TEE.  If blood cultures remain persistently positive, will  consult cardiology for TEE.   Increased oxygen requirement today, will recheck chest x-ray today.  Acute metabolic encephalopathy: Severe and persistent.  No focal deficit.  Patient somehow delirious and awake today.  No meaningful interaction.  CT head negative.  Remains n.p.o. due to not waking up. Seen by speech every day. Will use small dose of Haldol for agitation and delirium. Will resume Cymbalta, try supervised medication administration if possible. If no adequate oral intake, will put NG tube and start feeding tomorrow.  Hypokalemia: Replaced.  Adequate today.  Acute kidney injury on chronic kidney disease stage IV: Previous available creatinine of 2 in the system.  With obstructive uropathy and chronic indwelling catheter.  Resuscitated with fluid.  Continue close monitoring.  Making adequate urine.  Renal functions adequately improving. Will decrease IV fluids today.  Multifocal pneumonia: Initially not much respiratory symptoms.  COVID-19 point-of-care test negative, PCR negative.  Adequate coverage with Zosyn. Repeat chest x-ray today.  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  Draining well.   DVT prophylaxis: Lovenox subcu Code Status: Full code Family Communication: Patient's son, Mr. Philipp Ovens, 10/03/2019.  Will call. Disposition Plan: patient is from skilled nursing facility. Anticipated DC to skilled nursing facility, Barriers to discharge active treatment for sepsis.   Consultants:   None  Procedures:   None  Antimicrobials:  Anti-infectives (From admission, onward)   Start     Dose/Rate Route Frequency Ordered Stop   10/03/19 2000  vancomycin (VANCOCIN) IVPB 1000 mg/200 mL premix  Status:  Discontinued     1,000 mg 200 mL/hr over 60 Minutes Intravenous Every 48 hours 10/02/19 0830 10/03/19 0832   10/03/19 1800  piperacillin-tazobactam (ZOSYN) IVPB 2.25 g     2.25 g 100 mL/hr over 30 Minutes Intravenous Every 6 hours 10/03/19 1426      10/03/19 0831  vancomycin variable dose per unstable renal function (pharmacist dosing)  Status:  Discontinued      Does not apply See admin instructions 10/03/19 0832 10/03/19 1420   10/02/19 1200  ceFEPIme (MAXIPIME) 2 g in sodium chloride 0.9 % 100 mL IVPB  Status:  Discontinued     2 g 200 mL/hr over 30 Minutes Intravenous Every 24 hours 10/01/19 1605 10/03/19 1420   10/01/19 1615  vancomycin (VANCOREADY) IVPB 1500 mg/300 mL     1,500 mg 150 mL/hr over 120 Minutes Intravenous STAT 10/01/19 1606 10/01/19 1915   10/01/19 1607  vancomycin variable dose per unstable renal function (pharmacist dosing)  Status:  Discontinued      Does not apply See admin instructions 10/01/19 1607 10/02/19 0830   10/01/19 1245  ceFEPIme (MAXIPIME) 2 g in sodium chloride 0.9 % 100 mL IVPB     2 g 200 mL/hr over 30 Minutes Intravenous  Once 10/01/19 1233 10/01/19 1317   10/01/19 1245  metroNIDAZOLE (FLAGYL) IVPB 500 mg     500 mg 100 mL/hr over 60 Minutes Intravenous  Once 10/01/19 1233 10/01/19 1344         Subjective: Patient seen and examined.  No overnight events.  Since last night she is moaning and nodding her head but no meaningful interaction.  Remains afebrile. On early morning examination, try to examine her mouth, she will resist opening, gave her sips of water, she was able to swallow.  She will not cooperate, she was pocketing her pills on subsequent medicine administration.  Objective: Vitals:   10/04/19 0830 10/04/19 0900 10/04/19 0949 10/04/19 1000  BP:    113/75  Pulse: (!) 117 (!) 118 (!) 121   Resp: (!) 22 (!) 26 (!) 27 (!) 27  Temp: 100.2 F (37.9 C) 100.2 F (37.9 C) 100.2 F (37.9 C) 100.2 F (37.9 C)  TempSrc:      SpO2: 92% 93% (!) 89% 92%  Weight:      Height:        Intake/Output Summary (Last 24 hours) at 10/04/2019 1024 Last data filed at 10/04/2019 1000 Gross per 24 hour  Intake 3996.45 ml  Output 1275 ml  Net 2721.45 ml   Filed Weights   10/01/19 1234  10/01/19 1805  Weight: 72.2 kg 67.7 kg    Examination:  General exam: Appears calm, comfortable. Chronically sick looking.  Awake and disoriented today. Respiratory system: Clear to auscultation. Respiratory effort normal.  No added sounds. Cardiovascular system: S1 & S2 heard, RRR. No pedal edema. Gastrointestinal system: Abdomen is nondistended, soft and nontender. No organomegaly or masses felt. Normal bowel sounds heard. No suprapubic tenderness or mass. Central nervous system: Awake, confused and delirious.  Weakness of all extremities.  No focal deficit.  She resists movement of the extremities. Skin: No rashes, lesions or ulcers Psychiatry: Judgement and insight appear compromised.   Mood & affect flat and anxious.    Data Reviewed: I have personally reviewed following labs and imaging studies  CBC: Recent Labs  Lab 10/01/19 1211 10/01/19 1840 10/02/19 0545 10/03/19 0049 10/04/19 0204  WBC 14.4* 20.7* 19.3* 22.2* 21.3*  NEUTROABS 13.3*  --   --  18.9* 17.6*  HGB 10.1* 9.6* 9.6* 9.5* 9.7*  HCT 33.5* 31.1* 31.2* 30.4* 30.9*  MCV 93.6 93.4 92.9 91.8 89.8  PLT 199 157 129* 128* 0000000*   Basic Metabolic Panel: Recent Labs  Lab 10/01/19 1211 10/01/19 1211 10/01/19 1840 10/01/19 2200 10/02/19 0545 10/03/19 0049 10/04/19 0204  NA 142  --   --  142 140 142 144  K 2.9*  --   --  4.9 4.9 4.8 4.3  CL 110  --   --  112* 114* 115* 115*  CO2 17*  --   --  17* 19* 18* 19*  GLUCOSE 198*  --   --  208* 158* 139* 115*  BUN 48*  --   --  52* 55* 60* 57*  CREATININE 2.51*   < > 2.93* 2.61* 2.51* 2.56* 2.27*  CALCIUM 7.0*  --   --  7.6* 7.8* 8.3* 8.2*  MG  --   --   --   --  1.8 1.9  --   PHOS  --   --   --   --   --  3.0  --    < > = values in this interval not displayed.   GFR: Estimated Creatinine Clearance: 19.4 mL/min (A) (by C-G formula based on SCr of 2.27 mg/dL (H)). Liver Function Tests: Recent Labs  Lab 10/01/19 1211 10/02/19 0545  AST 14* 30  ALT 9 10    ALKPHOS 72 58  BILITOT 0.6 0.7  PROT 5.4* 5.3*  ALBUMIN 2.4* 2.3*   No results for input(s): LIPASE, AMYLASE in the last 168 hours. No results for input(s): AMMONIA in the last 168 hours. Coagulation Profile: Recent Labs  Lab 10/01/19 1840  INR 2.0*   Cardiac Enzymes: No results for input(s): CKTOTAL, CKMB, CKMBINDEX, TROPONINI in the last 168 hours. BNP (last 3 results) No results for input(s): PROBNP in the last 8760 hours. HbA1C: No results for input(s): HGBA1C in the last 72 hours. CBG: Recent Labs  Lab 10/01/19 1129  GLUCAP 204*   Lipid Profile: No results for input(s): CHOL, HDL, LDLCALC, TRIG, CHOLHDL, LDLDIRECT in the last 72 hours. Thyroid Function Tests: Recent Labs    10/01/19 1840  TSH 0.836   Anemia Panel: No results for input(s): VITAMINB12, FOLATE, FERRITIN, TIBC, IRON, RETICCTPCT in the last 72 hours. Sepsis Labs: Recent Labs  Lab 10/01/19 1840 10/01/19 2200 10/02/19 0229 10/02/19 0545 10/03/19 0049  PROCALCITON 46.82  --   --  68.27 54.86  LATICACIDVEN 4.4* 3.7* 2.1* 1.5  --     Recent Results (from the past 240 hour(s))  Blood Culture (routine x 2)     Status: None (Preliminary result)   Collection Time: 10/01/19 12:12 PM   Specimen: BLOOD  Result Value Ref Range Status   Specimen Description   Final    BLOOD RIGHT WRIST Performed at [facility] [address], [place], [place] [address]    Special Requests   Final    BOTTLES DRAWN AEROBIC AND ANAEROBIC Blood Culture results may not be optimal due to an inadequate volume of blood received in culture bottles Performed at [facility] [address], [place], [place] [address]    Culture  Setup Time   Final    GRAM POSITIVE COCCI GRAM NEGATIVE RODS CRITICAL VALUE NOTED.  VALUE IS CONSISTENT WITH PREVIOUSLY REPORTED AND CALLED VALUE.    Culture   Final    GRAM NEGATIVE RODS IDENTIFICATION AND SUSCEPTIBILITIES TO FOLLOW GRAM POSITIVE  COCCI CULTURE REINCUBATED FOR BETTER GROWTH Performed at [facility], [place] [address], [place], [place] [address]  Report Status PENDING  Incomplete  Blood Culture (routine x 2)     Status: None (Preliminary result)   Collection Time: 10/01/19 12:13 PM   Specimen: BLOOD LEFT FOREARM  Result Value Ref Range Status   Specimen Description   Final    BLOOD LEFT FOREARM Performed at [facility] [address] [place], [place] [address]    Special Requests   Final    BOTTLES DRAWN AEROBIC AND ANAEROBIC Blood Culture results may not be optimal due to an inadequate volume of blood received in culture bottles Performed at [facility] [address], [place], [place] [address]    Culture  Setup Time   Final    GRAM POSITIVE COCCI GRAM NEGATIVE RODS IN BOTH AEROBIC AND ANAEROBIC BOTTLES CRITICAL RESULT CALLED TO, READ BACK BY AND VERIFIED WITH: PHARMD A. PHAM 0840 [id number] FCP    Culture   Final    GRAM NEGATIVE RODS IDENTIFICATION AND SUSCEPTIBILITIES TO FOLLOW GRAM POSITIVE COCCI CULTURE REINCUBATED FOR BETTER GROWTH Performed at [facility], [place] [address] [place], [place] [address]    Report Status PENDING  Incomplete  Blood Culture ID Panel (Reflexed)     Status: Abnormal   Collection Time: 10/01/19 12:13 PM  Result Value Ref Range Status   Enterococcus species DETECTED (A) NOT DETECTED Final    Comment: CRITICAL RESULT CALLED TO, READ BACK BY AND VERIFIED WITH: PHARMD A. PHAM 0840 [id number] FCP    Vancomycin resistance NOT DETECTED NOT DETECTED Final   Listeria monocytogenes NOT DETECTED NOT DETECTED Final   Staphylococcus species NOT DETECTED NOT DETECTED Final   Staphylococcus aureus (BCID) NOT DETECTED NOT DETECTED Final   Streptococcus species NOT DETECTED NOT DETECTED Final   Streptococcus agalactiae NOT DETECTED NOT DETECTED Final   Streptococcus pneumoniae NOT DETECTED NOT DETECTED Final   Streptococcus pyogenes NOT  DETECTED NOT DETECTED Final   Acinetobacter baumannii NOT DETECTED NOT DETECTED Final   Enterobacteriaceae species DETECTED (A) NOT DETECTED Final    Comment: Enterobacteriaceae represent a large family of gram-negative bacteria, not a single organism. CRITICAL RESULT CALLED TO, READ BACK BY AND VERIFIED WITH: PHARMD A. PHAM 0840 [id number] FCP    Enterobacter cloacae complex NOT DETECTED NOT DETECTED Final   Escherichia coli NOT DETECTED NOT DETECTED Final   Klebsiella oxytoca NOT DETECTED NOT DETECTED Final   Klebsiella pneumoniae NOT DETECTED NOT DETECTED Final   Proteus species DETECTED (A) NOT DETECTED Final    Comment: CRITICAL RESULT CALLED TO, READ BACK BY AND VERIFIED WITH: PHARMD A. PHAM 0840 [id number] FCP    Serratia marcescens NOT DETECTED NOT DETECTED Final   Carbapenem resistance NOT DETECTED NOT DETECTED Final   Haemophilus influenzae NOT DETECTED NOT DETECTED Final   Neisseria meningitidis NOT DETECTED NOT DETECTED Final   Pseudomonas aeruginosa NOT DETECTED NOT DETECTED Final   Candida albicans NOT DETECTED NOT DETECTED Final   Candida glabrata NOT DETECTED NOT DETECTED Final   Candida krusei NOT DETECTED NOT DETECTED Final   Candida parapsilosis NOT DETECTED NOT DETECTED Final   Candida tropicalis NOT DETECTED NOT DETECTED Final    Comment: Performed at [facility], [place] [address], [place],  [address]  Urine culture     Status: Abnormal (Preliminary result)   Collection Time: 10/01/19  2:27 PM   Specimen: In/Out Cath Urine  Result Value Ref Range Status   Specimen Description   Final    IN/OUT CATH URINE Performed at [facility]  [place] [address], [place], [place] [address]    Special Requests   Final    NONE Performed at [facility], [place] [address], [place], [place] [address]    Culture (A)  Final    >=100,000 COLONIES/mL PROVIDENCIA STUARTII >=100,000 COLONIES/mL PROTEUS MIRABILIS SUSCEPTIBILITIES TO  FOLLOW Performed at [facility], [place] [address], [place], [place] [address]    Report Status PENDING  Incomplete  Respiratory Panel by RT PCR (Flu A&B, Covid) - Urine, Catheterized     Status: None   Collection Time: 10/01/19  2:27 PM   Specimen: Urine, Catheterized  Result Value Ref Range Status   SARS Coronavirus 2 by RT PCR NEGATIVE NEGATIVE Final    Comment: (NOTE) SARS-CoV-2 target nucleic acids are NOT DETECTED. The SARS-CoV-2 RNA is generally detectable in upper respiratoy specimens during the acute phase of infection. The lowest concentration of SARS-CoV-2 viral copies this assay can detect is 131 copies/mL. A negative result does not preclude SARS-Cov-2 infection and should not be used as the sole basis for treatment or other patient management decisions. A negative result may occur with  improper specimen collection/handling, submission of specimen other than nasopharyngeal swab, presence of viral mutation(s) within the areas targeted by this assay, and inadequate number of viral copies (<131 copies/mL). A negative result must be combined with clinical observations, patient history, and epidemiological information. The expected result is Negative. Fact Sheet for Patients:  PinkCheek.be Fact Sheet for Healthcare Providers:  GravelBags.it This test is not yet ap proved or cleared by the [place] FDA and  has been authorized for detection and/or diagnosis of SARS-CoV-2 by FDA under an Emergency Use Authorization (EUA). This EUA will remain  in effect (meaning this test can be used) for the duration of the COVID-19 declaration under Section 564(b)(1) of the Act, 21 U.S.C. section 360bbb-3(b)(1), unless the authorization is terminated or revoked sooner.    Influenza A by PCR NEGATIVE NEGATIVE Final   Influenza B by PCR NEGATIVE NEGATIVE Final    Comment: (NOTE) The Xpert Xpress SARS-CoV-2/FLU/RSV assay is  intended as an aid in  the diagnosis of influenza from Nasopharyngeal swab specimens and  should not be used as a sole basis for treatment. Nasal washings and  aspirates are unacceptable for Xpert Xpress SARS-CoV-2/FLU/RSV  testing. Fact Sheet for Patients: PinkCheek.be Fact Sheet for Healthcare Providers: GravelBags.it This test is not yet approved or cleared by the [place] FDA and  has been authorized for detection and/or diagnosis of SARS-CoV-2 by  FDA under an Emergency Use Authorization (EUA). This EUA will remain  in effect (meaning this test can be used) for the duration of the  Covid-19 declaration under Section 564(b)(1) of the Act, 21  U.S.C. section 360bbb-3(b)(1), unless the authorization is  terminated or revoked. Performed at [facility] [place] [address], [place], [place] [address]   MRSA PCR Screening     Status: None   Collection Time: 10/01/19  6:28 PM   Specimen: Nasopharyngeal  Result Value Ref Range Status   MRSA by PCR NEGATIVE NEGATIVE Final    Comment:        The GeneXpert MRSA Assay (FDA approved for NASAL specimens only), is one component of a comprehensive MRSA colonization surveillance program. It is not intended to diagnose MRSA infection nor to guide or monitor treatment for MRSA infections. Performed at [facility], [place] [address], [place],  [address]   Culture, blood (  routine x 2)     Status: None (Preliminary result)   Collection Time: 10/03/19 12:49 AM   Specimen: BLOOD  Result Value Ref Range Status   Specimen Description   Final    BLOOD RIGHT ARM Performed at [facility] [address], [place], [place] [address]    Special Requests   Final    BOTTLES DRAWN AEROBIC ONLY Blood Culture adequate volume Performed at [facility] [address] [place], [place] [address]    Culture   Final      NO GROWTH < 12 HOURS Performed at [facility] [address], [place], [place] [address]    Report Status PENDING  Incomplete  Culture, blood (routine x 2)     Status: None (Preliminary result)   Collection Time: 10/03/19 12:49 AM   Specimen: BLOOD  Result Value Ref Range Status   Specimen Description   Final    BLOOD RIGHT HAND Performed at [facility] [address], [place], [place] [address]    Special Requests   Final    BOTTLES DRAWN AEROBIC ONLY Blood Culture adequate volume Performed at [facility] [address] [place],  [address]    Culture   Final    NO GROWTH < 12 HOURS Performed at [facility] [address], [place],  [address]    Report Status PENDING  Incomplete         Radiology Studies: CT HEAD WO CONTRAST  Result Date: 10/03/2019 CLINICAL DATA:  Altered mental status EXAM: CT HEAD WITHOUT CONTRAST TECHNIQUE: Contiguous axial images were obtained from the base of the skull through the vertex without intravenous contrast. COMPARISON:  2019 FINDINGS: Brain: There is no acute intracranial hemorrhage, mass-effect, or edema. Gray-white differentiation is preserved. There is no extra-axial fluid collection. Confluent areas of hypoattenuation in the supratentorial white matter are nonspecific but probably reflects stable chronic microvascular ischemic changes. Prominence of the ventricles and sulci reflects stable parenchymal volume loss. 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 Vascular: There is atherosclerotic calcification at the skull base. Skull: Calvarium is unremarkable. Sinuses/Orbits: No acute finding. Other: None. IMPRESSION: No acute intracranial abnormality. Stable chronic findings detailed above. Electronically Signed   By: Macy Mis M.D.   On: 10/03/2019 10:45   ECHOCARDIOGRAM COMPLETE  Result Date:  10/03/2019   ECHOCARDIOGRAM REPORT   Patient Name:   CADY STICKEL [facility] Date of Exam: 10/03/2019 Medical Rec #:  [id number]        Height:       66.0 in Accession #:    [id number]       Weight:       149.3 lb Date of Birth:  12-26-1940        BSA:          1.77 m Patient Age:    70 years         BP:           103/52 mmHg Patient Gender: F                HR:           100 bpm. Exam Location:  Inpatient Procedure: 2D Echo, Cardiac Doppler and Color Doppler Indications:    Bacteremia 790.7 / R78.81  History:        Patient has no prior history of Echocardiogram examinations.  Risk Factors:Hypertension and Diabetes.  Sonographer:    Jonelle Sidle Dance Referring Phys: [id number] [place]  1. Left ventricular ejection fraction, by visual estimation, is 35 to 40%. The left ventricle has moderately decreased function. There is no left ventricular hypertrophy.  2. Severe hypokinesis of the left ventricular, entire apical segment.  3. Severe hypokinesis of the left ventricular, entire inferior wall and inferolateral wall.  4. Indeterminate diastolic filling due to E-A fusion.  5. The left ventricle demonstrates regional wall motion abnormalities.  6. Global right ventricle has normal systolic function.The right ventricular size is normal. No increase in right ventricular wall thickness.  7. Left atrial size was normal.  8. Right atrial size was normal.  9. Mild mitral annular calcification. 10. The mitral valve is normal in structure. Mild mitral valve regurgitation. 11. The tricuspid valve is normal in structure. 12. The tricuspid valve is normal in structure. Tricuspid valve regurgitation is not demonstrated. 13. The aortic valve is tricuspid. Aortic valve regurgitation is not visualized. Mild aortic valve sclerosis without stenosis. 14. The pulmonic valve was normal in structure. Pulmonic valve regurgitation is not visualized. FINDINGS  Left Ventricle: Left ventricular ejection fraction, by visual  estimation, is 35 to 40%. The left ventricle has moderately decreased function. Severe hypokinesis of the left ventricular, entire inferior wall and inferolateral wall. Severe hypokinesis of the left ventricular, entire apical segment. The left ventricle demonstrates regional wall motion abnormalities. The left ventricular internal cavity size was the left ventricle is normal in size. There is no left ventricular hypertrophy. Indeterminate diastolic filling due to E-A fusion. Right Ventricle: The right ventricular size is normal. No increase in right ventricular wall thickness. Global RV systolic function is has normal systolic function. Left Atrium: Left atrial size was normal in size. Right Atrium: Right atrial size was normal in size Pericardium: There is no evidence of pericardial effusion. Mitral Valve: The mitral valve is normal in structure. Mild mitral annular calcification. Mild mitral valve regurgitation, with centrally-directed jet. Tricuspid Valve: The tricuspid valve is normal in structure. Tricuspid valve regurgitation is not demonstrated. Aortic Valve: The aortic valve is tricuspid. . There is mild thickening and moderate calcification of the aortic valve. Aortic valve regurgitation is not visualized. Aortic regurgitation PHT measures 265 msec. Mild aortic valve sclerosis is present, with  no evidence of aortic valve stenosis. There is mild thickening of the aortic valve. There is moderate calcification of the aortic valve. Pulmonic Valve: The pulmonic valve was normal in structure. Pulmonic valve regurgitation is not visualized. Pulmonic regurgitation is not visualized. Aorta: The aortic root and ascending aorta are structurally normal, with no evidence of dilitation. IAS/Shunts: No atrial level shunt detected by color flow Doppler.  LEFT VENTRICLE PLAX 2D LVIDd:         4.20 cm LVIDs:         3.30 cm LV PW:         1.20 cm LV IVS:        0.90 cm LVOT diam:     2.00 cm LV SV:         34 ml LV SV  Index:   19.32 LVOT Area:     3.14 cm  RIGHT VENTRICLE            IVC RV Basal diam:  2.20 cm    IVC diam: 1.70 cm RV S prime:     8.27 cm/s TAPSE (M-mode): 1.1 cm LEFT ATRIUM  Index       RIGHT ATRIUM          Index LA diam:        3.70 cm 2.10 cm/m  RA Area:     8.05 cm LA Vol (A2C):   47.8 ml 27.07 ml/m RA Volume:   14.90 ml 8.44 ml/m LA Vol (A4C):   38.6 ml 21.86 ml/m LA Biplane Vol: 43.5 ml 24.63 ml/m  AORTIC VALVE LVOT Vmax:   72.15 cm/s LVOT Vmean:  45.550 cm/s LVOT VTI:    0.142 m AI PHT:      265 msec  AORTA Ao Root diam: 3.20 cm Ao Asc diam:  3.40 cm MITRAL VALVE MV Area (PHT): 7.66 cm             SHUNTS MV PHT:        28.71 msec           Systemic VTI:  0.14 m MV Decel Time: 99 msec              Systemic Diam: 2.00 cm MV E velocity: 135.00 cm/s 103 cm/s  Mihai Croitoru MD Electronically signed by Sanda Klein MD Signature Date/Time: 10/03/2019/10:28:12 AM    Final         Scheduled Meds: . Chlorhexidine Gluconate Cloth  6 each Topical Daily  . DULoxetine  20 mg Oral Daily  . enoxaparin (LOVENOX) injection  30 mg Subcutaneous Q24H  . mouth rinse  15 mL Mouth Rinse BID  . methylPREDNISolone (SOLU-MEDROL) injection  40 mg Intravenous Q12H   Continuous Infusions: . lactated ringers 125 mL/hr at 10/04/19 1000  . piperacillin-tazobactam (ZOSYN)  IV Stopped (10/04/19 0532)     LOS: 3 days    Time spent: 30 minutes    Barb Merino, MD [facility] Pager [phone]

## 2019-10-04 NOTE — Progress Notes (Signed)
This RN took over caring for this patient from [place], [place]. RN. This RN noticed patient was leaning to right side in bed, holding right arm and had noticeable facial droop to the right side. Patient unable to answer questions or follow commands to squeeze hands, move feet, or smile. No documentation of these things had been noted previously. MD called to bedside. No new orders at this time, MD to have RN continue to monitor.

## 2019-10-04 NOTE — NC FL2 (Signed)
[place] LEVEL OF CARE SCREENING TOOL     IDENTIFICATION  Patient Name: Stephanie Olson Birthdate: 25-Jul-1941 Sex: female Admission Date (Current Location): 10/01/2019  [place] and [place] Number:  Kathleen Argue [id number] [place] and Address:  [facility],  [place], [place]      Provider Number: [id number]  Attending Physician Name and Address:  Barb Merino, MD  Relative Name and Phone Number:  Tilden Dome [phone]  [phone]    Current Level of Care: Hospital Recommended Level of Care: [place] Prior Approval Number:    Date Approved/Denied:   PASRR Number:    Discharge Plan: SNF    Current Diagnoses: Patient Active Problem List   Diagnosis Date Noted  . Severe sepsis ([place]) 10/01/2019  . Acute pyelonephritis 10/01/2019  . Hypotension 10/01/2019  . Hypokalemia 10/01/2019  . Chronic indwelling Foley catheter 10/01/2019  . Sinus tachycardia 10/01/2019  . Abnormal liver function 05/27/2019  . Diet-controlled diabetes mellitus ([place])   . Adrenal insufficiency ([place])   . Lactic acidosis   . Acute lower UTI 04/22/2019  . Metabolic acidosis [id number]  . Malnutrition of moderate degree 04/25/2018  . Pressure injury of skin 04/23/2018  . ARF (acute renal failure) ([place])   . Acute urinary retention   . AKI (acute kidney injury) ([place])   . Renal failure (ARF), acute on chronic (HCC) 04/22/2018  . Essential hypertension 04/22/2018  . Anemia 04/22/2018  . Metabolic acidosis, normal anion gap (NAG) 04/22/2018  . Hyperkalemia 04/22/2018  . Aspiration into airway 04/22/2018  . Generalized weakness 04/22/2018  . Lethargy 04/22/2018  . Poor appetite 04/22/2018  . FTT (failure to thrive) in adult 04/22/2018  . Diabetic neuropathy ([place]) 11/21/2015  . 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/2015  . Diabetic ulcer of foot, limited to breakdown of skin ([place]) 11/21/2015  . Foot pain, left 02/14/2015  . PVD  (peripheral vascular disease) ([place]) 01/29/2015    Orientation RESPIRATION BLADDER Height & Weight     Self  O2(2L) Indwelling catheter Weight: 149 lb 4 oz (67.7 kg) Height:  5\' 6"  (167.6 cm)  BEHAVIORAL SYMPTOMS/MOOD NEUROLOGICAL BOWEL NUTRITION STATUS        Diet  AMBULATORY STATUS COMMUNICATION OF NEEDS Skin   Total Care Verbally PU Stage and Appropriate Care   PU Stage 2 Dressing: (PRN dressing changes)                   Personal Care Assistance Level of Assistance  Total care, Dressing, Feeding, Bathing Bathing Assistance: Maximum assistance Feeding assistance: Maximum assistance Dressing Assistance: Maximum assistance Total Care Assistance: Maximum assistance   Functional Limitations Info  Sight, Speech, Hearing Sight Info: Adequate Hearing Info: Adequate Speech Info: Adequate    SPECIAL CARE FACTORS FREQUENCY                       Contractures Contractures Info: Not present    Additional Factors Info  Code Status, Allergies Code Status Info: Full Code Allergies Info: NKA           Current Medications (10/04/2019):  This is the current hospital active medication list Current Facility-Administered Medications  Medication Dose Route Frequency Provider Last Rate Last Admin  . acetaminophen (TYLENOL) tablet 650 mg  650 mg Oral Q6H PRN [place] Donnamarie Poag, DO       Or  . acetaminophen (TYLENOL) suppository 650 mg  650 mg Rectal Q6H PRN [place] Donnamarie Poag, DO      .  bisacodyl (DULCOLAX) suppository 10 mg  10 mg Rectal Daily PRN [place] Donnamarie Poag, DO      . Chlorhexidine Gluconate Cloth 2 % PADS 6 each  6 each Topical Daily [place] Satia Winger J, DO   6 each at 10/04/19 [id number]  . DULoxetine (CYMBALTA) DR capsule 20 mg  20 mg Oral Daily Barb Merino, MD   20 mg at 10/04/19 0928  . enoxaparin (LOVENOX) injection 30 mg  30 mg Subcutaneous Q24H [place] Donnamarie Poag, DO   30 mg at 10/03/19 2138  . haloperidol lactate (HALDOL) injection 2 mg  2 mg Intravenous Q6H PRN Barb Merino, MD      . lactated ringers infusion   Intravenous Continuous Barb Merino, MD 75 mL/hr at 10/04/19 1600 Rate Verify at 10/04/19 1600  . MEDLINE mouth rinse  15 mL Mouth Rinse BID [place] Hailee Hollick J, DO   15 mL at 10/04/19 0818  . methylPREDNISolone sodium succinate (SOLU-MEDROL) 40 mg/mL injection 40 mg  40 mg Intravenous Q12H Barb Merino, MD   40 mg at 10/04/19 0815  . ondansetron (ZOFRAN) tablet 4 mg  4 mg Oral Q6H PRN [place] Donnamarie Poag, DO       Or  . ondansetron [facility]) injection 4 mg  4 mg Intravenous Q6H PRN [place] Zayvon Alicea J, DO      . piperacillin-tazobactam (ZOSYN) IVPB 2.25 g  2.25 g Intravenous Q6H Berton Mount, RPH 100 mL/hr at 10/04/19 1702 2.25 g at 10/04/19 1702     Discharge Medications: Please see discharge summary for a list of discharge medications.  Relevant Imaging Results:  Relevant Lab Results:   Additional Information SSN [id number]  Ross Ludwig, LCSW

## 2019-10-04 NOTE — Progress Notes (Signed)
ID PROGRESS NOTE  Remains afebrile, but WBC still not yet trending down. Micro sensitivity data still pending. Appears stable  For now, continue on iv piptazo for polymicrobial bacteremia associated with pyelonephritis. Will follow up tomorrow am.   Caren Griffins B. [facility] [phone]

## 2019-10-05 ENCOUNTER — Inpatient Hospital Stay (HOSPITAL_COMMUNITY): Payer: Medicare Other

## 2019-10-05 LAB — CBC WITH DIFFERENTIAL/PLATELET
Abs Immature Granulocytes: 0.62 10*3/uL — ABNORMAL HIGH (ref 0.00–0.07)
Basophils Absolute: 0.1 10*3/uL (ref 0.0–0.1)
Basophils Relative: 1 %
Eosinophils Absolute: 0 10*3/uL (ref 0.0–0.5)
Eosinophils Relative: 0 %
HCT: 35.4 % — ABNORMAL LOW (ref 36.0–46.0)
Hemoglobin: 10.6 g/dL — ABNORMAL LOW (ref 12.0–15.0)
Immature Granulocytes: 3 %
Lymphocytes Relative: 11 %
Lymphs Abs: 2.4 10*3/uL (ref 0.7–4.0)
MCH: 27.5 pg (ref 26.0–34.0)
MCHC: 29.9 g/dL — ABNORMAL LOW (ref 30.0–36.0)
MCV: 91.9 fL (ref 80.0–100.0)
Monocytes Absolute: 1.1 10*3/uL — ABNORMAL HIGH (ref 0.1–1.0)
Monocytes Relative: 5 %
Neutro Abs: 16.6 10*3/uL — ABNORMAL HIGH (ref 1.7–7.7)
Neutrophils Relative %: 80 %
Platelets: 151 10*3/uL (ref 150–400)
RBC: 3.85 MIL/uL — ABNORMAL LOW (ref 3.87–5.11)
RDW: 16.2 % — ABNORMAL HIGH (ref 11.5–15.5)
WBC: 20.7 10*3/uL — ABNORMAL HIGH (ref 4.0–10.5)
nRBC: 0.2 % (ref 0.0–0.2)

## 2019-10-05 LAB — URINE CULTURE: Culture: >=100000 — AB

## 2019-10-05 LAB — GLUCOSE, CAPILLARY
Glucose-Capillary: 248 mg/dL — ABNORMAL HIGH (ref 70–99)
Glucose-Capillary: 177 mg/dL — ABNORMAL HIGH (ref 70–99)
Glucose-Capillary: 226 mg/dL — ABNORMAL HIGH (ref 70–99)

## 2019-10-05 LAB — BASIC METABOLIC PANEL
Anion gap: 15 (ref 5–15)
BUN: 66 mg/dL — ABNORMAL HIGH (ref 8–23)
CO2: 16 mmol/L — ABNORMAL LOW (ref 22–32)
Calcium: 8.3 mg/dL — ABNORMAL LOW (ref 8.9–10.3)
Chloride: 115 mmol/L — ABNORMAL HIGH (ref 98–111)
Creatinine, Ser: 2.8 mg/dL — ABNORMAL HIGH (ref 0.44–1.00)
GFR calc Af Amer: 18 mL/min — ABNORMAL LOW (ref >60)
GFR calc non Af Amer: 16 mL/min — ABNORMAL LOW (ref >60)
Glucose, Bld: 227 mg/dL — ABNORMAL HIGH (ref 70–99)
Potassium: 4.9 mmol/L (ref 3.5–5.1)
Sodium: 146 mmol/L — ABNORMAL HIGH (ref 135–145)

## 2019-10-05 LAB — HEMOGLOBIN A1C
Hgb A1c MFr Bld: 5.9 % — ABNORMAL HIGH (ref 4.8–5.6)
Mean Plasma Glucose: 122.63 mg/dL

## 2019-10-05 MED ORDER — INSULIN ASPART 100 UNIT/ML ~~LOC~~ SOLN
0.0000 [IU] | Freq: Three times a day (TID) | SUBCUTANEOUS | Status: DC
  Administered 2019-10-05: 2 [IU] via SUBCUTANEOUS
  Administered 2019-10-05 – 2019-10-06 (×3): 3 [IU] via SUBCUTANEOUS
  Administered 2019-10-07: 2 [IU] via SUBCUTANEOUS
  Administered 2019-10-07: 5 [IU] via SUBCUTANEOUS
  Administered 2019-10-07: 3 [IU] via SUBCUTANEOUS
  Administered 2019-10-08: 08:00:00 9 [IU] via SUBCUTANEOUS

## 2019-10-05 MED ORDER — INSULIN ASPART 100 UNIT/ML ~~LOC~~ SOLN
0.0000 [IU] | Freq: Every day | SUBCUTANEOUS | Status: DC
  Administered 2019-10-05: 2 [IU] via SUBCUTANEOUS
  Administered 2019-10-06: 3 [IU] via SUBCUTANEOUS
  Administered 2019-10-07: 23:00:00 5 [IU] via SUBCUTANEOUS

## 2019-10-05 MED ORDER — HEPARIN SODIUM (PORCINE) 5000 UNIT/ML IJ SOLN
5000.0000 [IU] | Freq: Three times a day (TID) | INTRAMUSCULAR | Status: DC
  Administered 2019-10-05 – 2019-10-06 (×2): 5000 [IU] via SUBCUTANEOUS
  Filled 2019-10-05 (×2): qty 1

## 2019-10-05 MED ORDER — METOPROLOL TARTRATE 5 MG/5ML IV SOLN
5.0000 mg | Freq: Four times a day (QID) | INTRAVENOUS | Status: DC | PRN
  Administered 2019-10-05: 5 mg via INTRAVENOUS
  Filled 2019-10-05: qty 5

## 2019-10-05 NOTE — Progress Notes (Signed)
Patients HR continues to burst up into the 150's. EKG obtained and Dr. Sloan Leiter made aware. Will continue with plan of care.

## 2019-10-05 NOTE — Plan of Care (Signed)
  Problem: Urinary Elimination: Goal: Signs and symptoms of infection will decrease Outcome: Progressing   Problem: Elimination: Goal: Will not experience complications related to urinary retention Outcome: Progressing   Problem: Pain Managment: Goal: General experience of comfort will improve Outcome: Progressing   Problem: Safety: Goal: Ability to remain free from injury will improve Outcome: Progressing

## 2019-10-05 NOTE — Progress Notes (Signed)
Pts HR sustaining in 120's, foley temp is 100.2, RR 24. Dr. Sloan Leiter made aware. Patient only nods head up and down when spoken to. Will continue with plan of care.

## 2019-10-05 NOTE — Progress Notes (Signed)
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  Reportedly she is alert and oriented x 4 at base line and was willing to transfer back to assisted living from nursing home in near future.  In the emergency room, temperature 100.1.  Heart rate 114, respiratory 24, blood pressure 86/61.  On room air.  WBC count 14,000, procalcitonin 46, lactic acid 4.  Chest x-ray patchy bibasilar opacities, left greater than right.  CT abdomen pelvis with bilateral hydronephrosis with pyelonephritis and bladder distention.  Resuscitated, started on sepsis protocol and admitted to stepdown unit.   Assessment & Plan:   Principal Problem:   Severe sepsis ([place]) Active Problems:   Renal failure (ARF), acute on chronic (HCC)   Pressure injury of skin   Malnutrition of moderate degree   Lactic acidosis   Acute pyelonephritis   Hypotension   Hypokalemia   Chronic indwelling Foley catheter   Sinus tachycardia  Severe sepsis present on admission: Most likely from urinary source.  Also has multifocal pneumonia. Blood cultures with Proteus and procidentia.  Urine cultures with Proteus and providencia. Treated with broad-spectrum antibiotics, now deescalated to Zosyn.   Blood pressures lactic acid improving. Persistently elevated WBC count, however also on steroids. Echocardiogram with ejection fraction 40%, no vegetations. Repeat chest x-ray showed more widespread pneumonia, responded well to a dose of Lasix. Foley catheter exchange and draining well.  Creatinine fluctuates. Initial CT scan with bilateral hydronephrosis, repeat ultrasound today  shows good drainage with decompression. History of hypercortisolism, on IV steroid until good oral intake.  Acute metabolic encephalopathy: Severe and persistent.  No focal deficit.  CT head negative.  Persistently encephalopathic and lethargic.  Somehow clinical improvement today.  Hypokalemia: Replaced.  Adequate today.  Acute kidney injury on chronic kidney disease stage IV: Previous available creatinine of 2 in the system.  With obstructive uropathy and chronic indwelling catheter.  Resuscitated with fluid.  Continue close monitoring.  Making adequate urine.   Multifocal pneumonia: Initially not much respiratory symptoms.  COVID-19 point-of-care test negative, PCR negative.  Adequate coverage with Zosyn.  Suspect aspirating. Followed by speech therapy every day.  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  Draining well.  Repeat ultrasound with improvement.   DVT prophylaxis: Heparin subcu Code Status: Full code Family Communication: Patient's son, will call. Disposition Plan: patient is from skilled nursing facility. Anticipated DC to skilled nursing facility, Barriers to discharge active treatment for sepsis.   Consultants:   None  Procedures:   None  Antimicrobials:  Anti-infectives (From admission, onward)   Start     Dose/Rate Route Frequency Ordered Stop   10/03/19 2000  vancomycin (VANCOCIN) IVPB 1000 mg/200 mL premix  Status:  Discontinued     1,000 mg 200 mL/hr over 60 Minutes Intravenous Every 48 hours 10/02/19 0830 10/03/19 0832   10/03/19 1800  piperacillin-tazobactam (ZOSYN) IVPB 2.25 g     2.25 g 100 mL/hr over 30 Minutes Intravenous Every 6 hours 10/03/19 1426     10/03/19 0831  vancomycin variable dose per unstable renal function (pharmacist dosing)  Status:  Discontinued      Does not apply See admin instructions 10/03/19 0832 10/03/19 1420  10/02/19 1200  ceFEPIme (MAXIPIME) 2 g in sodium chloride 0.9 % 100 mL IVPB  Status:   Discontinued     2 g 200 mL/hr over 30 Minutes Intravenous Every 24 hours 10/01/19 1605 10/03/19 1420   10/01/19 1615  vancomycin (VANCOREADY) IVPB 1500 mg/300 mL     1,500 mg 150 mL/hr over 120 Minutes Intravenous STAT 10/01/19 1606 10/01/19 1915   10/01/19 1607  vancomycin variable dose per unstable renal function (pharmacist dosing)  Status:  Discontinued      Does not apply See admin instructions 10/01/19 1607 10/02/19 0830   10/01/19 1245  ceFEPIme (MAXIPIME) 2 g in sodium chloride 0.9 % 100 mL IVPB     2 g 200 mL/hr over 30 Minutes Intravenous  Once 10/01/19 1233 10/01/19 1317   10/01/19 1245  metroNIDAZOLE (FLAGYL) IVPB 500 mg     500 mg 100 mL/hr over 60 Minutes Intravenous  Once 10/01/19 1233 10/01/19 1344         Subjective: Seen and examined.  No overnight events.  Just nods her head yes and no.  Does give her hand and follow simple commands.  Slightly tachycardic and with low-grade temperature 100.2.  Repeat cultures are negative.  Unable to swallow.  Breathing better after dose of Lasix and decreasing IV fluids. Objective: Vitals:   10/04/19 2200 10/05/19 0000 10/05/19 0400 10/05/19 0900  BP: 115/77 118/84 105/66   Pulse:  (!) 117 (!) 111 (!) 111  Resp: (!) 21 18 (!) 22 (!) 24  Temp: 99.3 F (37.4 C) 99.7 F (37.6 C) 99.9 F (37.7 C) 100.2 F (37.9 C)  TempSrc:      SpO2:  92% 96% 95%  Weight:      Height:        Intake/Output Summary (Last 24 hours) at 10/05/2019 1016 Last data filed at 10/05/2019 0828 Gross per 24 hour  Intake 1513.61 ml  Output 650 ml  Net 863.61 ml   Filed Weights   10/01/19 1234 10/01/19 1805  Weight: 72.2 kg 67.7 kg    Examination:  General exam: Appears calm, comfortable. Chronically sick looking.  Awake and alert but unable to verbalize. Respiratory system: Clear to auscultation. Respiratory effort normal.  No added sounds.  Some conducted airway sounds. Cardiovascular system: S1 & S2 heard, RRR. No pedal  edema. Gastrointestinal system: Abdomen is nondistended, soft and nontender. No organomegaly or masses felt. Normal bowel sounds heard. No suprapubic tenderness or mass. Foley catheter with free draining clear urine. Central nervous system: Awake, confused and delirious.  Weakness of all extremities.  No focal deficit.  Skin: No rashes, lesions or ulcers Psychiatry: Judgement and insight appear compromised.   Mood & affect flat and anxious.    Data Reviewed: I have personally reviewed following labs and imaging studies  CBC: Recent Labs  Lab 10/01/19 1211 10/01/19 1211 10/01/19 1840 10/02/19 0545 10/03/19 0049 10/04/19 0204 10/05/19 0210  WBC 14.4*   < > 20.7* 19.3* 22.2* 21.3* 20.7*  NEUTROABS 13.3*  --   --   --  18.9* 17.6* 16.6*  HGB 10.1*   < > 9.6* 9.6* 9.5* 9.7* 10.6*  HCT 33.5*   < > 31.1* 31.2* 30.4* 30.9* 35.4*  MCV 93.6   < > 93.4 92.9 91.8 89.8 91.9  PLT 199   < > 157 129* 128* 125* 151   < > = values in this interval not displayed.   Basic Metabolic Panel: Recent Labs  Lab 10/01/19 2200 10/02/19 0545 10/03/19  [id number] 10/04/19 0204 10/05/19 0210  NA 142 140 142 144 146*  K 4.9 4.9 4.8 4.3 4.9  CL 112* 114* 115* 115* 115*  CO2 17* 19* 18* 19* 16*  GLUCOSE 208* 158* 139* 115* 227*  BUN 52* 55* 60* 57* 66*  CREATININE 2.61* 2.51* 2.56* 2.27* 2.80*  CALCIUM 7.6* 7.8* 8.3* 8.2* 8.3*  MG  --  1.8 1.9  --   --   PHOS  --   --  3.0  --   --    GFR: Estimated Creatinine Clearance: 15.8 mL/min (A) (by C-G formula based on SCr of 2.8 mg/dL (H)). Liver Function Tests: Recent Labs  Lab 10/01/19 1211 10/02/19 0545  AST 14* 30  ALT 9 10  ALKPHOS 72 58  BILITOT 0.6 0.7  PROT 5.4* 5.3*  ALBUMIN 2.4* 2.3*   No results for input(s): LIPASE, AMYLASE in the last 168 hours. No results for input(s): AMMONIA in the last 168 hours. Coagulation Profile: Recent Labs  Lab 10/01/19 1840  INR 2.0*   Cardiac Enzymes: No results for input(s): CKTOTAL, CKMB, CKMBINDEX,  TROPONINI in the last 168 hours. BNP (last 3 results) No results for input(s): PROBNP in the last 8760 hours. HbA1C: No results for input(s): HGBA1C in the last 72 hours. CBG: Recent Labs  Lab 10/01/19 1129  GLUCAP 204*   Lipid Profile: No results for input(s): CHOL, HDL, LDLCALC, TRIG, CHOLHDL, LDLDIRECT in the last 72 hours. Thyroid Function Tests: No results for input(s): TSH, T4TOTAL, FREET4, T3FREE, THYROIDAB in the last 72 hours. Anemia Panel: No results for input(s): VITAMINB12, FOLATE, FERRITIN, TIBC, IRON, RETICCTPCT in the last 72 hours. Sepsis Labs: Recent Labs  Lab 10/01/19 1840 10/01/19 2200 10/02/19 0229 10/02/19 0545 10/03/19 0049  PROCALCITON 46.82  --   --  68.27 54.86  LATICACIDVEN 4.4* 3.7* 2.1* 1.5  --     Recent Results (from the past 240 hour(s))  Blood Culture (routine x 2)     Status: Abnormal (Preliminary result)   Collection Time: 10/01/19 12:12 PM   Specimen: BLOOD  Result Value Ref Range Status   Specimen Description   Final    BLOOD RIGHT WRIST Performed at [facility] [address], [place], [place] [address]    Special Requests   Final    BOTTLES DRAWN AEROBIC AND ANAEROBIC Blood Culture results may not be optimal due to an inadequate volume of blood received in culture bottles Performed at [facility] [address] [place], [place] [address]    Culture  Setup Time   Final    GRAM POSITIVE COCCI GRAM NEGATIVE RODS CRITICAL VALUE NOTED.  VALUE IS CONSISTENT WITH PREVIOUSLY REPORTED AND CALLED VALUE.    Culture (A)  Final    PROTEUS MIRABILIS PROVIDENCIA STUARTII SUSCEPTIBILITIES TO FOLLOW GRAM POSITIVE COCCI CULTURE REINCUBATED FOR BETTER GROWTH Performed at [facility], [place] [address], [place], [place] [address]    Report Status PENDING  Incomplete  Blood Culture (routine x 2)     Status: Abnormal (Preliminary result)   Collection Time: 10/01/19 12:13 PM   Specimen: BLOOD LEFT  FOREARM  Result Value Ref Range Status   Specimen Description   Final    BLOOD LEFT FOREARM Performed at [facility] [address] [place], [place] [address]    Special Requests   Final    BOTTLES DRAWN AEROBIC AND ANAEROBIC Blood Culture results may not be optimal due to an inadequate volume of  blood received in culture bottles Performed at [facility], [place] [address] [place], [place] [address]    Culture  Setup Time   Final    GRAM POSITIVE COCCI GRAM NEGATIVE RODS IN BOTH AEROBIC AND ANAEROBIC BOTTLES CRITICAL RESULT CALLED TO, READ BACK BY AND VERIFIED WITH: PHARMD A. PHAM 0840 [id number] FCP    Culture (A)  Final    PROTEUS MIRABILIS PROVIDENCIA STUARTII GRAM POSITIVE COCCI IDENTIFICATION TO FOLLOW CULTURE REINCUBATED FOR BETTER GROWTH Performed at [facility], [place] [address] [place], [place] [address]    Report Status PENDING  Incomplete   Organism ID, Bacteria PROTEUS MIRABILIS  Final   Organism ID, Bacteria PROVIDENCIA STUARTII  Final      Susceptibility   Proteus mirabilis - MIC*    AMPICILLIN <=2 SENSITIVE Sensitive     CEFAZOLIN <=4 SENSITIVE Sensitive     CEFEPIME <=0.12 SENSITIVE Sensitive     CEFTAZIDIME <=1 SENSITIVE Sensitive     CEFTRIAXONE <=0.25 SENSITIVE Sensitive     CIPROFLOXACIN 1 SENSITIVE Sensitive     GENTAMICIN <=1 SENSITIVE Sensitive     IMIPENEM 4 SENSITIVE Sensitive     TRIMETH/SULFA <=20 SENSITIVE Sensitive     AMPICILLIN/SULBACTAM <=2 SENSITIVE Sensitive     PIP/TAZO <=4 SENSITIVE Sensitive     * PROTEUS MIRABILIS   Providencia stuartii - MIC*    AMPICILLIN >=32 RESISTANT Resistant     CEFAZOLIN >=64 RESISTANT Resistant     CEFEPIME <=0.12 SENSITIVE Sensitive     CEFTAZIDIME <=1 SENSITIVE Sensitive     CEFTRIAXONE <=0.25 SENSITIVE Sensitive     CIPROFLOXACIN >=4 RESISTANT Resistant     GENTAMICIN RESISTANT Resistant     IMIPENEM 1 SENSITIVE Sensitive     TRIMETH/SULFA >=320 RESISTANT  Resistant     AMPICILLIN/SULBACTAM >=32 RESISTANT Resistant     PIP/TAZO <=4 SENSITIVE Sensitive     * PROVIDENCIA STUARTII  Blood Culture ID Panel (Reflexed)     Status: Abnormal   Collection Time: 10/01/19 12:13 PM  Result Value Ref Range Status   Enterococcus species DETECTED (A) NOT DETECTED Final    Comment: CRITICAL RESULT CALLED TO, READ BACK BY AND VERIFIED WITH: PHARMD A. PHAM 0840 [id number] FCP    Vancomycin resistance NOT DETECTED NOT DETECTED Final   Listeria monocytogenes NOT DETECTED NOT DETECTED Final   Staphylococcus species NOT DETECTED NOT DETECTED Final   Staphylococcus aureus (BCID) NOT DETECTED NOT DETECTED Final   Streptococcus species NOT DETECTED NOT DETECTED Final   Streptococcus agalactiae NOT DETECTED NOT DETECTED Final   Streptococcus pneumoniae NOT DETECTED NOT DETECTED Final   Streptococcus pyogenes NOT DETECTED NOT DETECTED Final   Acinetobacter baumannii NOT DETECTED NOT DETECTED Final   Enterobacteriaceae species DETECTED (A) NOT DETECTED Final    Comment: Enterobacteriaceae represent a large family of gram-negative bacteria, not a single organism. CRITICAL RESULT CALLED TO, READ BACK BY AND VERIFIED WITH: PHARMD A. PHAM 0840 [id number] FCP    Enterobacter cloacae complex NOT DETECTED NOT DETECTED Final   Escherichia coli NOT DETECTED NOT DETECTED Final   Klebsiella oxytoca NOT DETECTED NOT DETECTED Final   Klebsiella pneumoniae NOT DETECTED NOT DETECTED Final   Proteus species DETECTED (A) NOT DETECTED Final    Comment: CRITICAL RESULT CALLED TO, READ BACK BY AND VERIFIED WITH: PHARMD A. PHAM 0840 [id number] FCP    Serratia marcescens NOT DETECTED NOT DETECTED Final   Carbapenem resistance NOT DETECTED NOT DETECTED Final   Haemophilus influenzae NOT DETECTED NOT  DETECTED Final   Neisseria meningitidis NOT DETECTED NOT DETECTED Final   Pseudomonas aeruginosa NOT DETECTED NOT DETECTED Final   Candida albicans NOT DETECTED NOT DETECTED Final   Candida  glabrata NOT DETECTED NOT DETECTED Final   Candida krusei NOT DETECTED NOT DETECTED Final   Candida parapsilosis NOT DETECTED NOT DETECTED Final   Candida tropicalis NOT DETECTED NOT DETECTED Final    Comment: Performed at [facility], [place] [address], [place], [place] [address]  Urine culture     Status: Abnormal   Collection Time: 10/01/19  2:27 PM   Specimen: In/Out Cath Urine  Result Value Ref Range Status   Specimen Description   Final    IN/OUT CATH URINE Performed at [facility] [address] [place], [place] [address]    Special Requests   Final    NONE Performed at [facility], [place] [address], [place],  [address]    Culture (A)  Final    >=100,000 COLONIES/mL PROVIDENCIA STUARTII >=100,000 COLONIES/mL PROTEUS MIRABILIS    Report Status 10/05/2019 FINAL  Final   Organism ID, Bacteria PROVIDENCIA STUARTII (A)  Final   Organism ID, Bacteria PROTEUS MIRABILIS (A)  Final      Susceptibility   Proteus mirabilis - MIC*    AMPICILLIN <=2 SENSITIVE Sensitive     CEFAZOLIN <=4 SENSITIVE Sensitive     CEFTRIAXONE <=0.25 SENSITIVE Sensitive     CIPROFLOXACIN 1 SENSITIVE Sensitive     GENTAMICIN <=1 SENSITIVE Sensitive     IMIPENEM 8 INTERMEDIATE Intermediate     NITROFURANTOIN 256 RESISTANT Resistant     TRIMETH/SULFA <=20 SENSITIVE Sensitive     AMPICILLIN/SULBACTAM <=2 SENSITIVE Sensitive     PIP/TAZO <=4 SENSITIVE Sensitive     * >=100,000 COLONIES/mL PROTEUS MIRABILIS   Providencia stuartii - MIC*    AMPICILLIN >=32 RESISTANT Resistant     CEFAZOLIN >=64 RESISTANT Resistant     CEFTRIAXONE <=0.25 SENSITIVE Sensitive     CIPROFLOXACIN >=4 RESISTANT Resistant     GENTAMICIN RESISTANT Resistant     IMIPENEM 2 SENSITIVE Sensitive     NITROFURANTOIN 128 RESISTANT Resistant     TRIMETH/SULFA >=320 RESISTANT Resistant     AMPICILLIN/SULBACTAM >=32 RESISTANT Resistant     PIP/TAZO <=4 SENSITIVE Sensitive     * >=100,000  COLONIES/mL PROVIDENCIA STUARTII  Respiratory Panel by RT PCR (Flu A&B, Covid) - Urine, Catheterized     Status: None   Collection Time: 10/01/19  2:27 PM   Specimen: Urine, Catheterized  Result Value Ref Range Status   SARS Coronavirus 2 by RT PCR NEGATIVE NEGATIVE Final    Comment: (NOTE) SARS-CoV-2 target nucleic acids are NOT DETECTED. The SARS-CoV-2 RNA is generally detectable in upper respiratoy specimens during the acute phase of infection. The lowest concentration of SARS-CoV-2 viral copies this assay can detect is 131 copies/mL. A negative result does not preclude SARS-Cov-2 infection and should not be used as the sole basis for treatment or other patient management decisions. A negative result may occur with  improper specimen collection/handling, submission of specimen other than nasopharyngeal swab, presence of viral mutation(s) within the areas targeted by this assay, and inadequate number of viral copies (<131 copies/mL). A negative result must be combined with clinical observations, patient history, and epidemiological information. The expected result is Negative. Fact Sheet for Patients:  PinkCheek.be Fact Sheet for Healthcare Providers:  GravelBags.it This test is not yet ap proved or cleared by the [place] FDA  and  has been authorized for detection and/or diagnosis of SARS-CoV-2 by FDA under an Emergency Use Authorization (EUA). This EUA will remain  in effect (meaning this test can be used) for the duration of the COVID-19 declaration under Section 564(b)(1) of the Act, 21 U.S.C. section 360bbb-3(b)(1), unless the authorization is terminated or revoked sooner.    Influenza A by PCR NEGATIVE NEGATIVE Final   Influenza B by PCR NEGATIVE NEGATIVE Final    Comment: (NOTE) The Xpert Xpress SARS-CoV-2/FLU/RSV assay is intended as an aid in  the diagnosis of influenza from Nasopharyngeal swab specimens and    should not be used as a sole basis for treatment. Nasal washings and  aspirates are unacceptable for Xpert Xpress SARS-CoV-2/FLU/RSV  testing. Fact Sheet for Patients: PinkCheek.be Fact Sheet for Healthcare Providers: GravelBags.it This test is not yet approved or cleared by the [place] FDA and  has been authorized for detection and/or diagnosis of SARS-CoV-2 by  FDA under an Emergency Use Authorization (EUA). This EUA will remain  in effect (meaning this test can be used) for the duration of the  Covid-19 declaration under Section 564(b)(1) of the Act, 21  U.S.C. section 360bbb-3(b)(1), unless the authorization is  terminated or revoked. Performed at [facility], [place] [address], [place], [place] [address]   MRSA PCR Screening     Status: None   Collection Time: 10/01/19  6:28 PM   Specimen: Nasopharyngeal  Result Value Ref Range Status   MRSA by PCR NEGATIVE NEGATIVE Final    Comment:        The GeneXpert MRSA Assay (FDA approved for NASAL specimens only), is one component of a comprehensive MRSA colonization surveillance program. It is not intended to diagnose MRSA infection nor to guide or monitor treatment for MRSA infections. Performed at [facility], [place] [address] [place], [place] [address]   Culture, blood (routine x 2)     Status: None (Preliminary result)   Collection Time: 10/03/19 12:49 AM   Specimen: BLOOD  Result Value Ref Range Status   Specimen Description   Final    BLOOD RIGHT ARM Performed at [facility] [address], [place], [place] [address]    Special Requests   Final    BOTTLES DRAWN AEROBIC ONLY Blood Culture adequate volume Performed at [facility] [address], [place], [place] [address]    Culture   Final    NO GROWTH 2 DAYS Performed at [facility] [address] [place],  [place] [address]    Report Status PENDING  Incomplete  Culture, blood (routine x 2)     Status: None (Preliminary result)   Collection Time: 10/03/19 12:49 AM   Specimen: BLOOD  Result Value Ref Range Status   Specimen Description   Final    BLOOD RIGHT HAND Performed at [facility] [address] [place], [place] [address]    Special Requests   Final    BOTTLES DRAWN AEROBIC ONLY Blood Culture adequate volume Performed at [facility] [address], [place], [place] [address]    Culture   Final    NO GROWTH 2 DAYS Performed at [facility] [address] [place],  [address]    Report Status PENDING  Incomplete         Radiology Studies: CT HEAD WO CONTRAST  Result Date: 10/03/2019 CLINICAL DATA:  Altered mental status EXAM: CT HEAD  WITHOUT CONTRAST TECHNIQUE: Contiguous axial images were obtained from the base of the skull through the vertex without intravenous contrast. COMPARISON:  2019 FINDINGS: Brain: There is no acute intracranial hemorrhage, mass-effect, or edema. Gray-white differentiation is preserved. There is no extra-axial fluid collection. Confluent areas of hypoattenuation in the supratentorial white matter are nonspecific but probably reflects stable chronic microvascular ischemic changes. Prominence of the ventricles and sulci reflects stable parenchymal volume loss. 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 Vascular: There is atherosclerotic calcification at the skull base. Skull: Calvarium is unremarkable. Sinuses/Orbits: No acute finding. Other: None. IMPRESSION: No acute intracranial abnormality. Stable chronic findings detailed above. Electronically Signed   By: Macy Mis M.D.   On: 10/03/2019 10:45   US RENAL  Result Date: 10/05/2019 CLINICAL DATA:  Follow-up collecting system dilatation on recent CT EXAM: RENAL / URINARY TRACT ULTRASOUND  COMPLETE COMPARISON:  09/30/2018 FINDINGS: Right Kidney: Renal measurements: 9.2 x 4.3 x 4.3 cm. = volume: 89 mL. 2 cm cyst is noted within the upper pole of the right kidney with some slight increased echogenicity within. This has been seen on previous ultrasound examinations. Left Kidney: Renal measurements: 8.3 x 4.2 x 5.0 cm. = volume: 92 mL. Echogenicity within normal limits. No mass or hydronephrosis visualized. Bladder: Decompressed by Foley catheter. Other: None. IMPRESSION: Previously seen dilatation of the collecting systems and ureters bilaterally has resolved consistent with decompression of the bladder by the Foley catheter. The previously seen dilatation is likely related to the overly distended bladder. Complex cyst in the upper pole of the right kidney stable from previous ultrasounds. Electronically Signed   By: [name] M.D.   On: 10/05/2019 08:39   DG CHEST PORT 1 VIEW  Result Date: 10/04/2019 CLINICAL DATA:  Pneumonia EXAM: PORTABLE CHEST 1 VIEW COMPARISON:  09/30/2018 FINDINGS: Right much greater than left pulmonary opacities. No significant pleural effusion. No pneumothorax. Stable cardiomediastinal contours. IMPRESSION: Increased right much greater than left pulmonary opacities, which may reflect edema or pneumonia. Electronically Signed   By: Macy Mis M.D.   On: 10/04/2019 11:21        Scheduled Meds:  Chlorhexidine Gluconate Cloth  6 each Topical Daily   DULoxetine  20 mg Oral Daily   heparin injection (subcutaneous)  5,000 Units Subcutaneous Q8H   insulin aspart  0-5 Units Subcutaneous QHS   insulin aspart  0-9 Units Subcutaneous TID WC   mouth rinse  15 mL Mouth Rinse BID   methylPREDNISolone (SOLU-MEDROL) injection  40 mg Intravenous Q12H   Continuous Infusions:  lactated ringers 75 mL/hr at 10/05/19 0400   piperacillin-tazobactam (ZOSYN)  IV Stopped (10/05/19 0557)     LOS: 4 days    Time spent: 25 minutes    Barb Merino, MD [facility] Pager [phone]

## 2019-10-05 NOTE — Progress Notes (Signed)
[place] [facility]    Date of Admission:  10/01/2019   Total days of antibiotics 5/ day 3 piptazo           ID: Stephanie Olson is a 79 y.o. female with  Polymicrobial bacteremia/ sepsis from urinary source Principal Problem:   Severe sepsis ([place]) Active Problems:   Renal failure (ARF), acute on chronic (HCC)   Pressure injury of skin   Malnutrition of moderate degree   Lactic acidosis   Acute pyelonephritis   Hypotension   Hypokalemia   Chronic indwelling Foley catheter   Sinus tachycardia    Subjective: More alert, afebrile, 100.2 F through most of the day, WBC not improved still at 20K  Medications:  . Chlorhexidine Gluconate Cloth  6 each Topical Daily  . DULoxetine  20 mg Oral Daily  . heparin injection (subcutaneous)  5,000 Units Subcutaneous Q8H  . insulin aspart  0-5 Units Subcutaneous QHS  . insulin aspart  0-9 Units Subcutaneous TID WC  . mouth rinse  15 mL Mouth Rinse BID  . methylPREDNISolone (SOLU-MEDROL) injection  40 mg Intravenous Q12H    Objective: Vital signs in last 24 hours: Temp:  [98.1 F (36.7 C)-100.2 F (37.9 C)] 98.1 F (36.7 C) (01/28 1700) Pulse Rate:  [107-122] 108 (01/28 1700) Resp:  [15-25] 15 (01/28 1700) BP: (94-118)/(66-84) 99/71 (01/28 1600) SpO2:  [92 %-100 %] 100 % (01/28 1700) Physical Exam  Constitutional:  oriented to person only. appears well-developed and well-nourished. No distress.  HENT: [place]/AT, PERRLA, no scleral icterus Mouth/Throat: Oropharynx is clear and moist. No oropharyngeal exudate.  Cardiovascular: Normal rate, regular rhythm and normal heart sounds. Exam reveals no gallop and no friction rub.  No murmur heard.  Pulmonary/Chest: Effort normal and breath sounds normal. No respiratory distress.  has no wheezes.  Neck = supple, no nuchal rigidity Abdominal: Soft. Bowel sounds are normal.  exhibits no distension. There is no tenderness.  Lymphadenopathy: no cervical adenopathy. No axillary  adenopathy Neurological: alert and oriented to person, only. Does not follow commands Skin: Skin is warm and dry. No rash noted. No erythema.  Psychiatric: a normal mood and affect.  behavior is normal.    Lab Results Recent Labs    10/04/19 0204 10/05/19 0210  WBC 21.3* 20.7*  HGB 9.7* 10.6*  HCT 30.9* 35.4*  NA 144 146*  K 4.3 4.9  CL 115* 115*  CO2 19* 16*  BUN 57* 66*  CREATININE 2.27* 2.80*    Microbiology: Blood cx 1/26 ngtd Blood cx 1/24: providencia, and proteus - GPC still difficult to isolate (though BCID found enterococcus) Studies/Results: US RENAL  Result Date: 10/05/2019 CLINICAL DATA:  Follow-up collecting system dilatation on recent CT EXAM: RENAL / URINARY TRACT ULTRASOUND COMPLETE COMPARISON:  09/30/2018 FINDINGS: Right Kidney: Renal measurements: 9.2 x 4.3 x 4.3 cm. = volume: 89 mL. 2 cm cyst is noted within the upper pole of the right kidney with some slight increased echogenicity within. This has been seen on previous ultrasound examinations. Left Kidney: Renal measurements: 8.3 x 4.2 x 5.0 cm. = volume: 92 mL. Echogenicity within normal limits. No mass or hydronephrosis visualized. Bladder: Decompressed by Foley catheter. Other: None. IMPRESSION: Previously seen dilatation of the collecting systems and ureters bilaterally has resolved consistent with decompression of the bladder by the Foley catheter. The previously seen dilatation is likely related to the overly distended bladder. Complex cyst in the upper pole of the right kidney stable from previous ultrasounds.  Electronically Signed   By: [name] M.D.   On: 10/05/2019 08:39   DG CHEST PORT 1 VIEW  Result Date: 10/04/2019 CLINICAL DATA:  Pneumonia EXAM: PORTABLE CHEST 1 VIEW COMPARISON:  09/30/2018 FINDINGS: Right much greater than left pulmonary opacities. No significant pleural effusion. No pneumothorax. Stable cardiomediastinal contours. IMPRESSION: Increased right much greater than left pulmonary  opacities, which may reflect edema or pneumonia. Electronically Signed   By: Macy Mis M.D.   On: 10/04/2019 11:21     Assessment/Plan:  Polymicrobial bacteremia from pyelonephritis = concern that she has not had much improvement in WBC and still low grade fever. Recommend to repeat abd/pelvis CT to see if any drainable fluid collections. Continue on piptazo that should cover pathogens isolated  GPC bacteremia = thought to be enterococcus however, the GNR pathogens overwhelming the petri dish. Difficult to isolate GPC. Awaiting to see what is identified tomorrow.  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  [facility] Cell: [phone] Pager: [phone]  10/05/2019, 5:41 PM

## 2019-10-06 ENCOUNTER — Inpatient Hospital Stay (HOSPITAL_COMMUNITY): Payer: Medicare Other | Admitting: Anesthesiology

## 2019-10-06 ENCOUNTER — Encounter (HOSPITAL_COMMUNITY)
Admission: EM | Disposition: A | Discharge status: Skilled Nursing Facility | Payer: Self-pay | Source: Home / Self Care | Attending: Internal Medicine

## 2019-10-06 ENCOUNTER — Encounter (HOSPITAL_COMMUNITY): Payer: Self-pay | Admitting: Internal Medicine

## 2019-10-06 HISTORY — PX: ESOPHAGOGASTRODUODENOSCOPY (EGD) WITH PROPOFOL: SHX5813

## 2019-10-06 LAB — HEMOGLOBIN AND HEMATOCRIT, BLOOD
HCT: 28.6 % — ABNORMAL LOW (ref 36.0–46.0)
HCT: 26.5 % — ABNORMAL LOW (ref 36.0–46.0)
HCT: 25 % — ABNORMAL LOW (ref 36.0–46.0)
Hemoglobin: 8.5 g/dL — ABNORMAL LOW (ref 12.0–15.0)
Hemoglobin: 7.9 g/dL — ABNORMAL LOW (ref 12.0–15.0)
Hemoglobin: 7.5 g/dL — ABNORMAL LOW (ref 12.0–15.0)

## 2019-10-06 LAB — BASIC METABOLIC PANEL
Anion gap: 9 (ref 5–15)
BUN: 82 mg/dL — ABNORMAL HIGH (ref 8–23)
CO2: 20 mmol/L — ABNORMAL LOW (ref 22–32)
Calcium: 8.1 mg/dL — ABNORMAL LOW (ref 8.9–10.3)
Chloride: 117 mmol/L — ABNORMAL HIGH (ref 98–111)
Creatinine, Ser: 2.99 mg/dL — ABNORMAL HIGH (ref 0.44–1.00)
GFR calc Af Amer: 17 mL/min — ABNORMAL LOW (ref >60)
GFR calc non Af Amer: 14 mL/min — ABNORMAL LOW (ref >60)
Glucose, Bld: 256 mg/dL — ABNORMAL HIGH (ref 70–99)
Potassium: 4.7 mmol/L (ref 3.5–5.1)
Sodium: 146 mmol/L — ABNORMAL HIGH (ref 135–145)

## 2019-10-06 LAB — CBC WITH DIFFERENTIAL/PLATELET
Abs Immature Granulocytes: 0.86 10*3/uL — ABNORMAL HIGH (ref 0.00–0.07)
Basophils Absolute: 0.1 10*3/uL (ref 0.0–0.1)
Basophils Relative: 1 %
Eosinophils Absolute: 0 10*3/uL (ref 0.0–0.5)
Eosinophils Relative: 0 %
HCT: 29 % — ABNORMAL LOW (ref 36.0–46.0)
Hemoglobin: 8.6 g/dL — ABNORMAL LOW (ref 12.0–15.0)
Immature Granulocytes: 5 %
Lymphocytes Relative: 12 %
Lymphs Abs: 2.2 10*3/uL (ref 0.7–4.0)
MCH: 27.9 pg (ref 26.0–34.0)
MCHC: 29.7 g/dL — ABNORMAL LOW (ref 30.0–36.0)
MCV: 94.2 fL (ref 80.0–100.0)
Monocytes Absolute: 1.3 10*3/uL — ABNORMAL HIGH (ref 0.1–1.0)
Monocytes Relative: 7 %
Neutro Abs: 14.4 10*3/uL — ABNORMAL HIGH (ref 1.7–7.7)
Neutrophils Relative %: 75 %
Platelets: 152 10*3/uL (ref 150–400)
RBC: 3.08 MIL/uL — ABNORMAL LOW (ref 3.87–5.11)
RDW: 16.2 % — ABNORMAL HIGH (ref 11.5–15.5)
WBC: 18.9 10*3/uL — ABNORMAL HIGH (ref 4.0–10.5)
nRBC: 0.2 % (ref 0.0–0.2)

## 2019-10-06 LAB — GLUCOSE, CAPILLARY
Glucose-Capillary: 238 mg/dL — ABNORMAL HIGH (ref 70–99)
Glucose-Capillary: 212 mg/dL — ABNORMAL HIGH (ref 70–99)
Glucose-Capillary: 279 mg/dL — ABNORMAL HIGH (ref 70–99)

## 2019-10-06 LAB — OCCULT BLOOD X 1 CARD TO LAB, STOOL: Fecal Occult Bld: POSITIVE — AB

## 2019-10-06 LAB — PHOSPHORUS: Phosphorus: 3.3 mg/dL (ref 2.5–4.6)

## 2019-10-06 LAB — MAGNESIUM: Magnesium: 1.9 mg/dL (ref 1.7–2.4)

## 2019-10-06 SURGERY — ESOPHAGOGASTRODUODENOSCOPY (EGD) WITH PROPOFOL
Anesthesia: Monitor Anesthesia Care

## 2019-10-06 MED ORDER — PANTOPRAZOLE SODIUM 40 MG IV SOLR
40.0000 mg | Freq: Two times a day (BID) | INTRAVENOUS | Status: DC
  Administered 2019-10-06 – 2019-10-08 (×5): 40 mg via INTRAVENOUS
  Filled 2019-10-06 (×5): qty 40

## 2019-10-06 MED ORDER — SODIUM CHLORIDE 0.9 % IV BOLUS
1000.0000 mL | Freq: Once | INTRAVENOUS | Status: AC
  Administered 2019-10-06: 1000 mL via INTRAVENOUS

## 2019-10-06 MED ORDER — LIDOCAINE HCL (CARDIAC) PF 100 MG/5ML IV SOSY
PREFILLED_SYRINGE | INTRAVENOUS | Status: DC | PRN
  Administered 2019-10-06: 100 mg via INTRAVENOUS

## 2019-10-06 MED ORDER — PHENYLEPHRINE 40 MCG/ML (10ML) SYRINGE FOR IV PUSH (FOR BLOOD PRESSURE SUPPORT)
PREFILLED_SYRINGE | INTRAVENOUS | Status: DC | PRN
  Administered 2019-10-06: 160 ug via INTRAVENOUS
  Administered 2019-10-06 (×2): 120 ug via INTRAVENOUS

## 2019-10-06 MED ORDER — METHYLPREDNISOLONE SODIUM SUCC 40 MG IJ SOLR
40.0000 mg | INTRAMUSCULAR | Status: DC
  Administered 2019-10-07: 40 mg via INTRAVENOUS
  Filled 2019-10-06: qty 1

## 2019-10-06 MED ORDER — PROPOFOL 10 MG/ML IV BOLUS
INTRAVENOUS | Status: DC | PRN
  Administered 2019-10-06: 30 mg via INTRAVENOUS

## 2019-10-06 SURGICAL SUPPLY — 15 items

## 2019-10-06 NOTE — Progress Notes (Signed)
SLP Cancellation Note  Patient Details Name: TOKIE ZEMP MRN: [id number] DOB: November 25, 1940   Cancelled treatment:       Reason Eval/Treat Not Completed: Other (comment);Medical issues which prohibited therapy(pt currently with concern for possible GI bleed, for endoscopic eval/tx, will continue efforts)   Macario Golds 10/06/2019, 11:40 AM  [name], MS [facility] [phone]

## 2019-10-06 NOTE — Progress Notes (Signed)
NS Bolus given per order prior to Endoscopy.   Patient stable at this time, no changes from AM assessment, off floor with ENDO.  Consent for procedure given over the phone with 2 RN sign off.

## 2019-10-06 NOTE — Progress Notes (Signed)
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  Reportedly she is alert and oriented x 4 at base line and was willing to transfer back to assisted living from nursing home in near future.  In the emergency room, temperature 100.1.  Heart rate 114, respiratory 24, blood pressure 86/61.  On room air.  WBC count 14,000, procalcitonin 46, lactic acid 4.  Chest x-ray patchy bibasilar opacities, left greater than right.  CT abdomen pelvis with bilateral hydronephrosis with pyelonephritis and bladder distention.  Resuscitated, started on sepsis protocol and admitted to stepdown unit. 1/29: Mental status improving.  Started having maroon-colored stool with drop in hemoglobin.   Assessment & Plan:   Principal Problem:   Severe sepsis ([place]) Active Problems:   Renal failure (ARF), acute on chronic (HCC)   Pressure injury of skin   Malnutrition of moderate degree   Lactic acidosis   Acute pyelonephritis   Hypotension   Hypokalemia   Chronic indwelling Foley catheter   Sinus tachycardia  Severe sepsis present on admission: Most likely from urinary source.  Also has multifocal pneumonia. Blood cultures with Proteus and providentia.  Urine cultures with Proteus and providencia. Treated with broad-spectrum antibiotics, now on Zosyn.   Clinically improving.  WC count elevated, however also on high-dose steroids. Echocardiogram with ejection fraction 40%, no vegetations. Foley catheter exchange and draining well.  Repeat renal ultrasound with decompression of hydronephrosis.  Creatinine fluctuates. Initial CT scan with bilateral  hydronephrosis, repeat ultrasound today shows good drainage with decompression. History of hypocortisolism, on IV steroids, will decrease doses further as BP stable and now have GI bleeding.  Will change to fludrocortisone when she starts eating.  Acute metabolic encephalopathy: Some clinical improvement today.  Patient has started talking.  No focal deficit.  CT head negative.   Repeated speech evaluation today.  Hopefully she can start eating.  Hypokalemia: Replaced.  Adequate today.  Acute kidney injury on chronic kidney disease stage IV: Previous available creatinine of 2 in the system.  With obstructive uropathy and chronic indwelling catheter.  Resuscitated with fluid.  Continue close monitoring.  Making adequate urine. Continues to fluctuate.  Will monitor closely.  Multifocal pneumonia: Aspiration versus fluid overload.  Clinically improving.  On antibiotics.  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  Draining well.  Repeat ultrasound with improvement.  Acute GI bleeding: Suspect upper GI bleeding.  Patient started having maroon-colored stool today.  On steroids.  Not on any ulcer protection.  Hemoglobin 9.6-8.  Will check every 6 hours.  Blood pressures are adequate. Bolus IV fluid and maintenance. Protonix 40 mg IV twice daily. Transfusion for hemoglobin less than 8, consented by patient's son Mr. Lavell Luster. Called and discussed case with gastroenterology, Dr. Watt Climes and will be seen. We will keep n.p.o. in case patient needs any procedures.   DVT prophylaxis: Heparin subcu, DC, start SCDs. Code Status: Full code Family Communication: Patient's son on the phone.  Also consented for PRBC if needed. Disposition Plan: patient is from skilled nursing facility. Anticipated DC to skilled nursing facility, Barriers to discharge active treatment for sepsis.   Consultants:   None  Procedures:   None  Antimicrobials:  Anti-infectives (  From  admission, onward)   Start     Dose/Rate Route Frequency Ordered Stop   10/03/19 2000  vancomycin (VANCOCIN) IVPB 1000 mg/200 mL premix  Status:  Discontinued     1,000 mg 200 mL/hr over 60 Minutes Intravenous Every 48 hours 10/02/19 0830 10/03/19 0832   10/03/19 1800  piperacillin-tazobactam (ZOSYN) IVPB 2.25 g     2.25 g 100 mL/hr over 30 Minutes Intravenous Every 6 hours 10/03/19 1426     10/03/19 0831  vancomycin variable dose per unstable renal function (pharmacist dosing)  Status:  Discontinued      Does not apply See admin instructions 10/03/19 0832 10/03/19 1420   10/02/19 1200  ceFEPIme (MAXIPIME) 2 g in sodium chloride 0.9 % 100 mL IVPB  Status:  Discontinued     2 g 200 mL/hr over 30 Minutes Intravenous Every 24 hours 10/01/19 1605 10/03/19 1420   10/01/19 1615  vancomycin (VANCOREADY) IVPB 1500 mg/300 mL     1,500 mg 150 mL/hr over 120 Minutes Intravenous STAT 10/01/19 1606 10/01/19 1915   10/01/19 1607  vancomycin variable dose per unstable renal function (pharmacist dosing)  Status:  Discontinued      Does not apply See admin instructions 10/01/19 1607 10/02/19 0830   10/01/19 1245  ceFEPIme (MAXIPIME) 2 g in sodium chloride 0.9 % 100 mL IVPB     2 g 200 mL/hr over 30 Minutes Intravenous  Once 10/01/19 1233 10/01/19 1317   10/01/19 1245  metroNIDAZOLE (FLAGYL) IVPB 500 mg     500 mg 100 mL/hr over 60 Minutes Intravenous  Once 10/01/19 1233 10/01/19 1344         Subjective: Patient seen and examined.  Surprisingly, she is today awake, she can talk in full sentences.  She can answer appropriately.  She is looking for her dentures.  Low-grade temperature overnight. Early morning patient had a large maroon-colored bowel movement.  No abdominal pain. Objective: Vitals:   10/06/19 0400 10/06/19 0700 10/06/19 0800 10/06/19 0900  BP: 113/68  92/63   Pulse:  (!) 33 (!) 109   Resp: 14 18 16  (!) 23  Temp: 99.5 F (37.5 C) 98.4 F (36.9 C) 98.4 F (36.9 C) 98.2 F (36.8 C)   TempSrc: Bladder     SpO2: 98% 100% 90%   Weight:      Height:        Intake/Output Summary (Last 24 hours) at 10/06/2019 1030 Last data filed at 10/06/2019 0700 Gross per 24 hour  Intake 1762.14 ml  Output 500 ml  Net 1262.14 ml   Filed Weights   10/01/19 1234 10/01/19 1805  Weight: 72.2 kg 67.7 kg    Examination:  General exam: Appears calm, comfortable. Chronically sick looking.  Alert and awake and following simple commands. Respiratory system: Clear to auscultation. Respiratory effort normal.  No added sounds.  On room air. Cardiovascular system: S1 & S2 heard, tachycardic.  RRR. No pedal edema. Gastrointestinal system: Abdomen is nondistended, soft and nontender. No organomegaly or masses felt. Normal bowel sounds heard. No suprapubic tenderness or mass. Foley catheter with free draining clear urine. Central nervous system: Alert and awake.  Weakness of all extremities.  No focal deficit.  Skin: No rashes, lesions or ulcers Psychiatry: Judgement and insight appear compromised.   Mood & affect flat and anxious.    Data Reviewed: I have personally reviewed following labs and imaging studies  CBC: Recent Labs  Lab 10/01/19 1211 10/01/19 1840 10/02/19 0545 10/02/19 0545 10/03/19 0049  10/04/19 0204 10/05/19 0210 10/06/19 0238 10/06/19 1000  WBC 14.4*   < > 19.3*  --  22.2* 21.3* 20.7* 18.9*  --   NEUTROABS 13.3*  --   --   --  18.9* 17.6* 16.6* 14.4*  --   HGB 10.1*   < > 9.6*   < > 9.5* 9.7* 10.6* 8.6* 8.5*  HCT 33.5*   < > 31.2*   < > 30.4* 30.9* 35.4* 29.0* 28.6*  MCV 93.6   < > 92.9  --  91.8 89.8 91.9 94.2  --   PLT 199   < > 129*  --  128* 125* 151 152  --    < > = values in this interval not displayed.   Basic Metabolic Panel: Recent Labs  Lab 10/02/19 0545 10/03/19 0049 10/04/19 0204 10/05/19 0210 10/06/19 0238  NA 140 142 144 146* 146*  K 4.9 4.8 4.3 4.9 4.7  CL 114* 115* 115* 115* 117*  CO2 19* 18* 19* 16* 20*  GLUCOSE 158* 139* 115* 227*  256*  BUN 55* 60* 57* 66* 82*  CREATININE 2.51* 2.56* 2.27* 2.80* 2.99*  CALCIUM 7.8* 8.3* 8.2* 8.3* 8.1*  MG 1.8 1.9  --   --  1.9  PHOS  --  3.0  --   --  3.3   GFR: Estimated Creatinine Clearance: 14.8 mL/min (A) (by C-G formula based on SCr of 2.99 mg/dL (H)). Liver Function Tests: Recent Labs  Lab 10/01/19 1211 10/02/19 0545  AST 14* 30  ALT 9 10  ALKPHOS 72 58  BILITOT 0.6 0.7  PROT 5.4* 5.3*  ALBUMIN 2.4* 2.3*   No results for input(s): LIPASE, AMYLASE in the last 168 hours. No results for input(s): AMMONIA in the last 168 hours. Coagulation Profile: Recent Labs  Lab 10/01/19 1840  INR 2.0*   Cardiac Enzymes: No results for input(s): CKTOTAL, CKMB, CKMBINDEX, TROPONINI in the last 168 hours. BNP (last 3 results) No results for input(s): PROBNP in the last 8760 hours. HbA1C: Recent Labs    10/05/19 1100  HGBA1C 5.9*   CBG: Recent Labs  Lab 10/01/19 1129 10/05/19 1226 10/05/19 1701 10/05/19 2144 10/06/19 0756  GLUCAP 204* 248* 177* 226* 238*   Lipid Profile: No results for input(s): CHOL, HDL, LDLCALC, TRIG, CHOLHDL, LDLDIRECT in the last 72 hours. Thyroid Function Tests: No results for input(s): TSH, T4TOTAL, FREET4, T3FREE, THYROIDAB in the last 72 hours. Anemia Panel: No results for input(s): VITAMINB12, FOLATE, FERRITIN, TIBC, IRON, RETICCTPCT in the last 72 hours. Sepsis Labs: Recent Labs  Lab 10/01/19 1840 10/01/19 2200 10/02/19 0229 10/02/19 0545 10/03/19 0049  PROCALCITON 46.82  --   --  68.27 54.86  LATICACIDVEN 4.4* 3.7* 2.1* 1.5  --     Recent Results (from the past 240 hour(s))  Blood Culture (routine x 2)     Status: Abnormal (Preliminary result)   Collection Time: 10/01/19 12:12 PM   Specimen: BLOOD  Result Value Ref Range Status   Specimen Description   Final    BLOOD RIGHT WRIST Performed at [facility] [address] [place], [place] [address]    Special Requests   Final    BOTTLES DRAWN AEROBIC  AND ANAEROBIC Blood Culture results may not be optimal due to an inadequate volume of blood received in culture bottles Performed at [facility] [address], [place],  [address]    Culture  Setup Time   Final    GRAM POSITIVE COCCI  GRAM NEGATIVE RODS CRITICAL VALUE NOTED.  VALUE IS CONSISTENT WITH PREVIOUSLY REPORTED AND CALLED VALUE.    Culture (A)  Final    PROTEUS MIRABILIS PROVIDENCIA STUARTII GRAM POSITIVE COCCI CULTURE REINCUBATED FOR BETTER GROWTH Performed at [facility], [place] [address], [place], [place] [address]    Report Status PENDING  Incomplete  Blood Culture (routine x 2)     Status: Abnormal (Preliminary result)   Collection Time: 10/01/19 12:13 PM   Specimen: BLOOD LEFT FOREARM  Result Value Ref Range Status   Specimen Description   Final    BLOOD LEFT FOREARM Performed at [facility] [address] [place], [place] [address]    Special Requests   Final    BOTTLES DRAWN AEROBIC AND ANAEROBIC Blood Culture results may not be optimal due to an inadequate volume of blood received in culture bottles Performed at [facility] [address], [place], [place] [address]    Culture  Setup Time   Final    GRAM POSITIVE COCCI GRAM NEGATIVE RODS IN BOTH AEROBIC AND ANAEROBIC BOTTLES CRITICAL RESULT CALLED TO, READ BACK BY AND VERIFIED WITH: PHARMD A. PHAM 0840 [id number] FCP    Culture (A)  Final    PROTEUS MIRABILIS PROVIDENCIA STUARTII GRAM POSITIVE COCCI CULTURE REINCUBATED FOR BETTER GROWTH Performed at [facility], [place] [address] [place], [place] [address]    Report Status PENDING  Incomplete   Organism ID, Bacteria PROTEUS MIRABILIS  Final   Organism ID, Bacteria PROVIDENCIA STUARTII  Final      Susceptibility   Proteus mirabilis - MIC*    AMPICILLIN <=2 SENSITIVE Sensitive     CEFAZOLIN <=4 SENSITIVE Sensitive     CEFEPIME <=0.12 SENSITIVE Sensitive     CEFTAZIDIME <=1  SENSITIVE Sensitive     CEFTRIAXONE <=0.25 SENSITIVE Sensitive     CIPROFLOXACIN 1 SENSITIVE Sensitive     GENTAMICIN <=1 SENSITIVE Sensitive     IMIPENEM 4 SENSITIVE Sensitive     TRIMETH/SULFA <=20 SENSITIVE Sensitive     AMPICILLIN/SULBACTAM <=2 SENSITIVE Sensitive     PIP/TAZO <=4 SENSITIVE Sensitive     * PROTEUS MIRABILIS   Providencia stuartii - MIC*    AMPICILLIN >=32 RESISTANT Resistant     CEFAZOLIN >=64 RESISTANT Resistant     CEFEPIME <=0.12 SENSITIVE Sensitive     CEFTAZIDIME <=1 SENSITIVE Sensitive     CEFTRIAXONE <=0.25 SENSITIVE Sensitive     CIPROFLOXACIN >=4 RESISTANT Resistant     GENTAMICIN RESISTANT Resistant     IMIPENEM 1 SENSITIVE Sensitive     TRIMETH/SULFA >=320 RESISTANT Resistant     AMPICILLIN/SULBACTAM >=32 RESISTANT Resistant     PIP/TAZO <=4 SENSITIVE Sensitive     * PROVIDENCIA STUARTII  Blood Culture ID Panel (Reflexed)     Status: Abnormal   Collection Time: 10/01/19 12:13 PM  Result Value Ref Range Status   Enterococcus species DETECTED (A) NOT DETECTED Final    Comment: CRITICAL RESULT CALLED TO, READ BACK BY AND VERIFIED WITH: PHARMD A. PHAM 0840 [id number] FCP    Vancomycin resistance NOT DETECTED NOT DETECTED Final   Listeria monocytogenes NOT DETECTED NOT DETECTED Final   Staphylococcus species NOT DETECTED NOT DETECTED Final   Staphylococcus aureus (BCID) NOT DETECTED NOT DETECTED Final   Streptococcus species NOT DETECTED NOT DETECTED Final   Streptococcus agalactiae NOT DETECTED NOT DETECTED Final   Streptococcus pneumoniae NOT DETECTED NOT DETECTED Final   Streptococcus pyogenes NOT DETECTED NOT DETECTED  Final   Acinetobacter baumannii NOT DETECTED NOT DETECTED Final   Enterobacteriaceae species DETECTED (A) NOT DETECTED Final    Comment: Enterobacteriaceae represent a large family of gram-negative bacteria, not a single organism. CRITICAL RESULT CALLED TO, READ BACK BY AND VERIFIED WITH: PHARMD A. PHAM 0840 [id number] FCP     Enterobacter cloacae complex NOT DETECTED NOT DETECTED Final   Escherichia coli NOT DETECTED NOT DETECTED Final   Klebsiella oxytoca NOT DETECTED NOT DETECTED Final   Klebsiella pneumoniae NOT DETECTED NOT DETECTED Final   Proteus species DETECTED (A) NOT DETECTED Final    Comment: CRITICAL RESULT CALLED TO, READ BACK BY AND VERIFIED WITH: PHARMD A. PHAM 0840 [id number] FCP    Serratia marcescens NOT DETECTED NOT DETECTED Final   Carbapenem resistance NOT DETECTED NOT DETECTED Final   Haemophilus influenzae NOT DETECTED NOT DETECTED Final   Neisseria meningitidis NOT DETECTED NOT DETECTED Final   Pseudomonas aeruginosa NOT DETECTED NOT DETECTED Final   Candida albicans NOT DETECTED NOT DETECTED Final   Candida glabrata NOT DETECTED NOT DETECTED Final   Candida krusei NOT DETECTED NOT DETECTED Final   Candida parapsilosis NOT DETECTED NOT DETECTED Final   Candida tropicalis NOT DETECTED NOT DETECTED Final    Comment: Performed at [facility], [place] [address] [place], [place] [address]  Urine culture     Status: Abnormal   Collection Time: 10/01/19  2:27 PM   Specimen: In/Out Cath Urine  Result Value Ref Range Status   Specimen Description   Final    IN/OUT CATH URINE Performed at [facility] [address], [place], [place] [address]    Special Requests   Final    NONE Performed at [facility], [place] [address], [place], [place] [address]    Culture (A)  Final    >=100,000 COLONIES/mL PROVIDENCIA STUARTII >=100,000 COLONIES/mL PROTEUS MIRABILIS    Report Status 10/05/2019 FINAL  Final   Organism ID, Bacteria PROVIDENCIA STUARTII (A)  Final   Organism ID, Bacteria PROTEUS MIRABILIS (A)  Final      Susceptibility   Proteus mirabilis - MIC*    AMPICILLIN <=2 SENSITIVE Sensitive     CEFAZOLIN <=4 SENSITIVE Sensitive     CEFTRIAXONE <=0.25 SENSITIVE Sensitive     CIPROFLOXACIN 1 SENSITIVE Sensitive     GENTAMICIN <=1 SENSITIVE Sensitive      IMIPENEM 8 INTERMEDIATE Intermediate     NITROFURANTOIN 256 RESISTANT Resistant     TRIMETH/SULFA <=20 SENSITIVE Sensitive     AMPICILLIN/SULBACTAM <=2 SENSITIVE Sensitive     PIP/TAZO <=4 SENSITIVE Sensitive     * >=100,000 COLONIES/mL PROTEUS MIRABILIS   Providencia stuartii - MIC*    AMPICILLIN >=32 RESISTANT Resistant     CEFAZOLIN >=64 RESISTANT Resistant     CEFTRIAXONE <=0.25 SENSITIVE Sensitive     CIPROFLOXACIN >=4 RESISTANT Resistant     GENTAMICIN RESISTANT Resistant     IMIPENEM 2 SENSITIVE Sensitive     NITROFURANTOIN 128 RESISTANT Resistant     TRIMETH/SULFA >=320 RESISTANT Resistant     AMPICILLIN/SULBACTAM >=32 RESISTANT Resistant     PIP/TAZO <=4 SENSITIVE Sensitive     * >=100,000 COLONIES/mL PROVIDENCIA STUARTII  Respiratory Panel by RT PCR (Flu A&B, Covid) - Urine, Catheterized     Status: None   Collection Time: 10/01/19  2:27 PM   Specimen: Urine, Catheterized  Result Value Ref Range Status   SARS Coronavirus 2 by RT PCR NEGATIVE NEGATIVE Final    Comment: (  NOTE) SARS-CoV-2 target nucleic acids are NOT DETECTED. The SARS-CoV-2 RNA is generally detectable in upper respiratoy specimens during the acute phase of infection. The lowest concentration of SARS-CoV-2 viral copies this assay can detect is 131 copies/mL. A negative result does not preclude SARS-Cov-2 infection and should not be used as the sole basis for treatment or other patient management decisions. A negative result may occur with  improper specimen collection/handling, submission of specimen other than nasopharyngeal swab, presence of viral mutation(s) within the areas targeted by this assay, and inadequate number of viral copies (<131 copies/mL). A negative result must be combined with clinical observations, patient history, and epidemiological information. The expected result is Negative. Fact Sheet for Patients:  PinkCheek.be Fact Sheet for Healthcare  Providers:  GravelBags.it This test is not yet ap proved or cleared by the [place] FDA and  has been authorized for detection and/or diagnosis of SARS-CoV-2 by FDA under an Emergency Use Authorization (EUA). This EUA will remain  in effect (meaning this test can be used) for the duration of the COVID-19 declaration under Section 564(b)(1) of the Act, 21 U.S.C. section 360bbb-3(b)(1), unless the authorization is terminated or revoked sooner.    Influenza A by PCR NEGATIVE NEGATIVE Final   Influenza B by PCR NEGATIVE NEGATIVE Final    Comment: (NOTE) The Xpert Xpress SARS-CoV-2/FLU/RSV assay is intended as an aid in  the diagnosis of influenza from Nasopharyngeal swab specimens and  should not be used as a sole basis for treatment. Nasal washings and  aspirates are unacceptable for Xpert Xpress SARS-CoV-2/FLU/RSV  testing. Fact Sheet for Patients: PinkCheek.be Fact Sheet for Healthcare Providers: GravelBags.it This test is not yet approved or cleared by the [place] FDA and  has been authorized for detection and/or diagnosis of SARS-CoV-2 by  FDA under an Emergency Use Authorization (EUA). This EUA will remain  in effect (meaning this test can be used) for the duration of the  Covid-19 declaration under Section 564(b)(1) of the Act, 21  U.S.C. section 360bbb-3(b)(1), unless the authorization is  terminated or revoked. Performed at [facility], [place] [address], [place], [place] [address]   MRSA PCR Screening     Status: None   Collection Time: 10/01/19  6:28 PM   Specimen: Nasopharyngeal  Result Value Ref Range Status   MRSA by PCR NEGATIVE NEGATIVE Final    Comment:        The GeneXpert MRSA Assay (FDA approved for NASAL specimens only), is one component of a comprehensive MRSA colonization surveillance program. It is not intended to diagnose MRSA infection nor  to guide or monitor treatment for MRSA infections. Performed at [facility], [place] [address] [place], [place] [address]   Culture, blood (routine x 2)     Status: None (Preliminary result)   Collection Time: 10/03/19 12:49 AM   Specimen: BLOOD  Result Value Ref Range Status   Specimen Description   Final    BLOOD RIGHT ARM Performed at [facility] [address] [place], [place] [address]    Special Requests   Final    BOTTLES DRAWN AEROBIC ONLY Blood Culture adequate volume Performed at [facility] [address], [place], [place] [address]    Culture   Final    NO GROWTH 3 DAYS Performed at [facility], [place] [address], [place], [place] [address]    Report Status PENDING  Incomplete  Culture, blood (routine x 2)  Status: None (Preliminary result)   Collection Time: 10/03/19 12:49 AM   Specimen: BLOOD  Result Value Ref Range Status   Specimen Description   Final    BLOOD RIGHT HAND Performed at [facility] [address] [place], [place] [address]    Special Requests   Final    BOTTLES DRAWN AEROBIC ONLY Blood Culture adequate volume Performed at [facility] [address] [place], [place] [address]    Culture   Final    NO GROWTH 3 DAYS Performed at [facility], [place] [address], [place], [place] [address]    Report Status PENDING  Incomplete         Radiology Studies: US RENAL  Result Date: 10/05/2019 CLINICAL DATA:  Follow-up collecting system dilatation on recent CT EXAM: RENAL / URINARY TRACT ULTRASOUND COMPLETE COMPARISON:  09/30/2018 FINDINGS: Right Kidney: Renal measurements: 9.2 x 4.3 x 4.3 cm. = volume: 89 mL. 2 cm cyst is noted within the upper pole of the right kidney with some slight increased echogenicity within. This has been seen on previous ultrasound examinations. Left Kidney: Renal measurements: 8.3 x 4.2 x 5.0 cm. = volume: 92  mL. Echogenicity within normal limits. No mass or hydronephrosis visualized. Bladder: Decompressed by Foley catheter. Other: None. IMPRESSION: Previously seen dilatation of the collecting systems and ureters bilaterally has resolved consistent with decompression of the bladder by the Foley catheter. The previously seen dilatation is likely related to the overly distended bladder. Complex cyst in the upper pole of the right kidney stable from previous ultrasounds. Electronically Signed   By: [name] M.D.   On: 10/05/2019 08:39   DG CHEST PORT 1 VIEW  Result Date: 10/04/2019 CLINICAL DATA:  Pneumonia EXAM: PORTABLE CHEST 1 VIEW COMPARISON:  09/30/2018 FINDINGS: Right much greater than left pulmonary opacities. No significant pleural effusion. No pneumothorax. Stable cardiomediastinal contours. IMPRESSION: Increased right much greater than left pulmonary opacities, which may reflect edema or pneumonia. Electronically Signed   By: Macy Mis M.D.   On: 10/04/2019 11:21        Scheduled Meds: . Chlorhexidine Gluconate Cloth  6 each Topical Daily  . DULoxetine  20 mg Oral Daily  . insulin aspart  0-5 Units Subcutaneous QHS  . insulin aspart  0-9 Units Subcutaneous TID WC  . mouth rinse  15 mL Mouth Rinse BID  . [START ON 10/07/2019] methylPREDNISolone (SOLU-MEDROL) injection  40 mg Intravenous Q24H  . pantoprazole (PROTONIX) IV  40 mg Intravenous Q12H   Continuous Infusions: . lactated ringers 75 mL/hr at 10/06/19 0700  . piperacillin-tazobactam (ZOSYN)  IV 2.25 g (10/06/19 0518)  . sodium chloride       LOS: 5 days    Time spent: 25 minutes    Barb Merino, MD [facility] Pager [phone]

## 2019-10-06 NOTE — Progress Notes (Signed)
Pharmacy Antibiotic Note  ZUNAIRA SORDEN is a 79 y.o. female admitted on 10/01/2019 with septic shock secondary to acute bilateral pyelonephritis complicated by nonfunctioning chronic indwelling Foley catheter. Pharmacy has been consulted for Zosyn  Today, 10/06/19  D6 Antibiotics, D4 Zosyn  Tmax 100.4, WBC remains elevated  SCr elevated, 2.61 on admit >> 2.99  BCID resulted Enterococcus and Proteus spp., Porvidencia grew in cx  Urine cx with proteus and providencia  Rpt blood cx no growth  Plan:  Continue Zosyn 2.25gm q6 - consider reduce to q8 if renal fx cont to worsen  Await final culture data   Monitor renal function, cultures, clinical course.   Height: 5\' 6"  (167.6 cm) Weight: 149 lb 4 oz (67.7 kg) IBW/kg (Calculated) : 59.3  Temp (24hrs), Avg:99.5 F (37.5 C), Min:98.1 F (36.7 C), Max:100.4 F (38 C)  Recent Labs  Lab 10/01/19 1211 10/01/19 1407 10/01/19 1840 10/01/19 1840 10/01/19 2200 10/02/19 0229 10/02/19 0545 10/03/19 0049 10/04/19 0204 10/05/19 0210 10/06/19 0238  WBC   < >  --  20.7*   < >  --   --  19.3* 22.2* 21.3* 20.7* 18.9*  CREATININE   < >  --  2.93*   < > 2.61*  --  2.51* 2.56* 2.27* 2.80* 2.99*  LATICACIDVEN  --  3.3* 4.4*  --  3.7* 2.1* 1.5  --   --   --   --    < > = values in this interval not displayed.    Estimated Creatinine Clearance: 14.8 mL/min (A) (by C-G formula based on SCr of 2.99 mg/dL (H)).    No Known Allergies  Antimicrobials this admission: 1/24 Metronidazole x 1 1/24 Vancomycin >> 1/26 1/24 Cefepime >> 1/26 1/26 Zosyn >>   Dose adjustments/drug levels this admission:   Microbiology results: 1/24 BCx: per BCID 4/4 bottles - enterococcus & proteus BCID, + Providencia (cx) 1/24 UCx: >100k proteus and providencia 1/24 COVID: negative 1/24 Influenza A/B: negative 1/26 repeat BCx: ngtd  Thank you for allowing pharmacy to be a part of this patient's care.  Minda Ditto PharmD 10/06/2019, 9:55  AM

## 2019-10-06 NOTE — Progress Notes (Signed)
  Speech Language Pathology Treatment: Dysphagia  Patient Details Name: Stephanie Olson MRN: [id number] DOB: 02-26-1941 Today's Date: 10/06/2019 Time: [id number] SLP Time Calculation (min) (ACUTE ONLY): 30 min  Assessment / Plan / Recommendation Clinical Impression  Pt willing to allow oral care, place dentures herself and accepted po intake of water, nectar juice and jello. Initially pt with coughing immediate post swallow of tsp, cup and straw bolus of thin. However after several boluses - this abated.   Swallow was delayed - most notably with jello over liquids but no further coughing.  Pt cued to expectorate jello as she proceeded to put more in her mouth without clearance when allowed to self feed and verbal cues to swallow were not consistently helpful. Use of nectar and thin helpful to clear oral residuals. Pt also finally expectorated approximately ONE HALF OF A CUP of masticated jello after not swallowing - therefore strict aspirations advised.  Recommend clears and will follow up next date for readiness for advancement *if GI approves.    HPI HPI: 79 yo female adm to [facility] from [place] with AMS-  metabolic encephalopathy due to pyelonephritis complicated by malfunctioning catheter - foley catheter was replaced.  Pt has not been appropriate to accept po intake and SlP was consulted.  Stephanie Guiles RN reports removing dentures for care was difficult due to pt's mentation.  Pt CT chest showed right more than left consolidations concerning for pna, ? COVID 19 per results.  Pt also with h/o DM - diet controlled, aspiration into airway in 04/2018, smoker, FTT.  Pt has h/o dysphagia with oral holding, delay in swallow with SLP up to 45 seconds with diet recommendations of liquids only during 04/2018 admit.  Today pt seen to determine readiness for diet. RN reports pt aspirated this am when taking her po medications with water. Now is congested and appears with increased facial asymmetry per RN.      SLP Plan  Continue with current plan of care       Recommendations  Diet recommendations: (clear) Liquids provided via: Cup;Straw Medication Administration: Whole meds with puree Supervision: Patient able to self feed;Full supervision/cueing for compensatory strategies Compensations: Slow rate;Small sips/bites;Follow solids with liquid Postural Changes and/or Swallow Maneuvers: Seated upright 90 degrees;Upright 30-60 min after meal                Oral Care Recommendations: Oral care BID(as pt allows) Follow up Recommendations: Skilled Nursing facility SLP Visit Diagnosis: Dysphagia, oropharyngeal phase (R13.12) Plan: Continue with current plan of care       GO                Stephanie Olson 10/06/2019, 4:02 PM   Stephanie Lime, MS [place] Office [phone]

## 2019-10-06 NOTE — Progress Notes (Addendum)
Patient resting in bed, alert to self, baseline per shift report. Skin pale, cool to touch. Large brown/maroon liquid colored stool noted.   MD Ghimire at bedside, notified of drop in HBG over the last 24 hours, Protonix IV ordered. Will obtain hemoccult stool. Consult to GI. Repeat H&H ordered.   Will continue to monitor.

## 2019-10-06 NOTE — Anesthesia Preprocedure Evaluation (Addendum)
Anesthesia Evaluation  Patient identified by MRN, date of birth, ID band Patient confused    Reviewed: Allergy & Precautions, NPO status , Patient's Chart, lab work & pertinent test results  History of Anesthesia Complications Negative for: history of anesthetic complications  Airway Mallampati: II  TM Distance: >3 FB Neck ROM: Full    Dental  (+) Dental Advisory Given   Pulmonary Current Smoker and Patient abstained from smoking.,    + rhonchi  + decreased breath sounds      Cardiovascular hypertension, + Peripheral Vascular Disease   Rhythm:Regular Rate:Tachycardia     Neuro/Psych PSYCHIATRIC DISORDERS Dementia negative neurological ROS     GI/Hepatic negative GI ROS, Neg liver ROS,   Endo/Other  diabetes  Renal/GU CRFRenal disease     Musculoskeletal  (+) Arthritis ,   Abdominal   Peds  Hematology  (+) anemia ,  INR 2.0    Anesthesia Other Findings Covid neg 1/24 Sepsis Malnutrition   Reproductive/Obstetrics                            Anesthesia Physical Anesthesia Plan  ASA: III  Anesthesia Plan: MAC   Post-op Pain Management:    Induction: Intravenous  PONV Risk Score and Plan: 2 and Propofol infusion and Treatment may vary due to age or medical condition  Airway Management Planned: Nasal Cannula and Natural Airway  Additional Equipment: None  Intra-op Plan:   Post-operative Plan:   Informed Consent: I have reviewed the patients History and Physical, chart, labs and discussed the procedure including the risks, benefits and alternatives for the proposed anesthesia with the patient or authorized representative who has indicated his/her understanding and acceptance.     Consent reviewed with POA  Plan Discussed with: CRNA and Anesthesiologist  Anesthesia Plan Comments: (Spoke with son, [name], on telephone (listed in chart) for consent.)      Anesthesia  Quick Evaluation

## 2019-10-06 NOTE — Progress Notes (Signed)
ID PROGRESS NOTE  ID : 79yo F with dementia, SNF resident, admitted for sepsis found to have polymicrobial bacteremia likely 2/2 urinary source with providencia spp and proteus. Bacteremia has cleared  24hr: having acute onset anemia, underwent EGD for evaluation of UGI source of bleed.   A/P:   polymicrobial bacteremia/GNR bacteremia = continue with piptazo. Would recommend to treat for a total of  14d. Since not significantly improved up to yesterday. Currently day 6 of abtx  Leukocytosis = can see with gi bleed/non-bleeding duodenal ulcers with clot formation found on EGD today by dr Amada Kingfisher.  Elzie Rings [place] [facility] [phone]

## 2019-10-06 NOTE — Consult Note (Signed)
Reason for Consult: GI bleeding Referring Physician: Hospital team  Stephanie Olson is an 79 y.o. female.  HPI: Patient doing much better over the last few days and is now more alert and awake and has an essentially negative GI history and it does not sound like she has had previous work-up before if we can trust her answering the questions and she does want to eat and does want to put her teeth in but did pass some maroon stools last night and we are consulted for further work-up and plans and she denies any previous GI symptoms at her rest home as well  Past Medical History:  Diagnosis Date  . Arthritis   . Diabetes mellitus without complication ([place])   . Diabetic foot ulcers (HCC)    RIGHT   . Hypertension   . Peripheral vascular disease [facility])     Past Surgical History:  Procedure Laterality Date  . ABDOMINAL AORTAGRAM  01/29/2015  . ATHERECTOMY Right 01/29/2015   FEMORAL ARTERY   . BALLOON ANGIOPLASTY, ARTERY Right 01/29/2015   RT FEMORAL   . FOOT SURGERY    . PERIPHERAL VASCULAR CATHETERIZATION N/A 01/29/2015   Procedure: Abdominal Aortogram w/Lower Extremity;  Surgeon: Serafina Mitchell, MD;  Location: [facility];  Service: Cardiovascular;  Laterality: N/A;    Family History  Problem Relation Age of Onset  . Hypertension Mother   . Hypertension Father     Social History:  reports that she has been smoking cigarettes. She has never used smokeless tobacco. She reports that she does not drink alcohol or use drugs.  Allergies: No Known Allergies  Medications: I have reviewed the patient's current medications.  Results for orders placed or performed during the hospital encounter of 10/01/19 (from the past 48 hour(s))  Basic metabolic panel     Status: Abnormal   Collection Time: 10/05/19  2:10 AM  Result Value Ref Range   Sodium 146 (H) 135 - 145 mmol/L   Potassium 4.9 3.5 - 5.1 mmol/L   Chloride 115 (H) 98 - 111 mmol/L   CO2 16 (L) 22 - 32 mmol/L   Glucose, Bld  227 (H) 70 - 99 mg/dL   BUN 66 (H) 8 - 23 mg/dL   Creatinine, Ser 2.80 (H) 0.44 - 1.00 mg/dL   Calcium 8.3 (L) 8.9 - 10.3 mg/dL   GFR calc non Af Amer 16 (L) >60 mL/min   GFR calc Af Amer 18 (L) >60 mL/min   Anion gap 15 5 - 15    Comment: Performed at [facility], [place] [address] [place], [place] [address]  CBC with Differential/Platelet     Status: Abnormal   Collection Time: 10/05/19  2:10 AM  Result Value Ref Range   WBC 20.7 (H) 4.0 - 10.5 K/uL   RBC 3.85 (L) 3.87 - 5.11 MIL/uL   Hemoglobin 10.6 (L) 12.0 - 15.0 g/dL   HCT 35.4 (L) 36.0 - 46.0 %   MCV 91.9 80.0 - 100.0 fL   MCH 27.5 26.0 - 34.0 pg   MCHC 29.9 (L) 30.0 - 36.0 g/dL   RDW 16.2 (H) 11.5 - 15.5 %   Platelets 151 150 - 400 K/uL   nRBC 0.2 0.0 - 0.2 %   Neutrophils Relative % 80 %   Neutro Abs 16.6 (H) 1.7 - 7.7 K/uL   Lymphocytes Relative 11 %   Lymphs Abs 2.4 0.7 - 4.0 K/uL   Monocytes Relative 5 %   Monocytes  Absolute 1.1 (H) 0.1 - 1.0 K/uL   Eosinophils Relative 0 %   Eosinophils Absolute 0.0 0.0 - 0.5 K/uL   Basophils Relative 1 %   Basophils Absolute 0.1 0.0 - 0.1 K/uL   Immature Granulocytes 3 %   Abs Immature Granulocytes 0.62 (H) 0.00 - 0.07 K/uL    Comment: Performed at [facility], [place] [address] [place], [place] [address]  Hemoglobin A1c     Status: Abnormal   Collection Time: 10/05/19 11:00 AM  Result Value Ref Range   Hgb A1c MFr Bld 5.9 (H) 4.8 - 5.6 %    Comment: (NOTE) Pre diabetes:          5.7%-6.4% Diabetes:              >6.4% Glycemic control for   <7.0% adults with diabetes    Mean Plasma Glucose 122.63 mg/dL    Comment: Performed at [place] [address], [place], [place] [address]  Glucose, capillary     Status: Abnormal   Collection Time: 10/05/19 12:26 PM  Result Value Ref Range   Glucose-Capillary 248 (H) 70 - 99 mg/dL   Comment 1 Notify RN    Comment 2 Document in Chart   Glucose, capillary     Status: Abnormal    Collection Time: 10/05/19  5:01 PM  Result Value Ref Range   Glucose-Capillary 177 (H) 70 - 99 mg/dL   Comment 1 Notify RN    Comment 2 Document in Chart   Glucose, capillary     Status: Abnormal   Collection Time: 10/05/19  9:44 PM  Result Value Ref Range   Glucose-Capillary 226 (H) 70 - 99 mg/dL  CBC with Differential/Platelet     Status: Abnormal   Collection Time: 10/06/19  2:38 AM  Result Value Ref Range   WBC 18.9 (H) 4.0 - 10.5 K/uL   RBC 3.08 (L) 3.87 - 5.11 MIL/uL   Hemoglobin 8.6 (L) 12.0 - 15.0 g/dL   HCT 29.0 (L) 36.0 - 46.0 %   MCV 94.2 80.0 - 100.0 fL   MCH 27.9 26.0 - 34.0 pg   MCHC 29.7 (L) 30.0 - 36.0 g/dL   RDW 16.2 (H) 11.5 - 15.5 %   Platelets 152 150 - 400 K/uL    Comment: REPEATED TO VERIFY PLATELET COUNT CONFIRMED BY SMEAR SPECIMEN CHECKED FOR CLOTS    nRBC 0.2 0.0 - 0.2 %   Neutrophils Relative % 75 %   Neutro Abs 14.4 (H) 1.7 - 7.7 K/uL   Lymphocytes Relative 12 %   Lymphs Abs 2.2 0.7 - 4.0 K/uL   Monocytes Relative 7 %   Monocytes Absolute 1.3 (H) 0.1 - 1.0 K/uL   Eosinophils Relative 0 %   Eosinophils Absolute 0.0 0.0 - 0.5 K/uL   Basophils Relative 1 %   Basophils Absolute 0.1 0.0 - 0.1 K/uL   Immature Granulocytes 5 %   Abs Immature Granulocytes 0.86 (H) 0.00 - 0.07 K/uL    Comment: Performed at [facility], [place] [address] [place], [place] [id number]  Basic metabolic panel     Status: Abnormal   Collection Time: 10/06/19  2:38 AM  Result Value Ref Range   Sodium 146 (H) 135 - 145 mmol/L   Potassium 4.7 3.5 - 5.1 mmol/L   Chloride 117 (H) 98 - 111 mmol/L   CO2 20 (L) 22 - 32 mmol/L   Glucose, Bld 256 (H) 70 - 99 mg/dL  BUN 82 (H) 8 - 23 mg/dL   Creatinine, Ser 2.99 (H) 0.44 - 1.00 mg/dL   Calcium 8.1 (L) 8.9 - 10.3 mg/dL   GFR calc non Af Amer 14 (L) >60 mL/min   GFR calc Af Amer 17 (L) >60 mL/min   Anion gap 9 5 - 15    Comment: Performed at [facility], [place] [address], [place], [place]  [address]  Magnesium     Status: None   Collection Time: 10/06/19  2:38 AM  Result Value Ref Range   Magnesium 1.9 1.7 - 2.4 mg/dL    Comment: Performed at [facility], [place] [address], [place], [place] [address]  Phosphorus     Status: None   Collection Time: 10/06/19  2:38 AM  Result Value Ref Range   Phosphorus 3.3 2.5 - 4.6 mg/dL    Comment: Performed at [facility], [place] [address] [place], [place] [address]  Glucose, capillary     Status: Abnormal   Collection Time: 10/06/19  7:56 AM  Result Value Ref Range   Glucose-Capillary 238 (H) 70 - 99 mg/dL   Comment 1 Notify RN    Comment 2 Document in Chart   Occult blood card to lab, stool RN will collect     Status: Abnormal   Collection Time: 10/06/19  9:04 AM  Result Value Ref Range   Fecal Occult Bld POSITIVE (A) NEGATIVE    Comment: Performed at [facility], [place] [address] [place], [place] [address]  Hemoglobin and hematocrit, blood     Status: Abnormal   Collection Time: 10/06/19 10:00 AM  Result Value Ref Range   Hemoglobin 8.5 (L) 12.0 - 15.0 g/dL   HCT 28.6 (L) 36.0 - 46.0 %    Comment: Performed at [facility], [place] [address] [place], [place] [address]    US RENAL  Result Date: 10/05/2019 CLINICAL DATA:  Follow-up collecting system dilatation on recent CT EXAM: RENAL / URINARY TRACT ULTRASOUND COMPLETE COMPARISON:  09/30/2018 FINDINGS: Right Kidney: Renal measurements: 9.2 x 4.3 x 4.3 cm. = volume: 89 mL. 2 cm cyst is noted within the upper pole of the right kidney with some slight increased echogenicity within. This has been seen on previous ultrasound examinations. Left Kidney: Renal measurements: 8.3 x 4.2 x 5.0 cm. = volume: 92 mL. Echogenicity within normal limits. No mass or hydronephrosis visualized. Bladder: Decompressed by Foley catheter. Other: None. IMPRESSION: Previously seen dilatation of the collecting systems and ureters bilaterally  has resolved consistent with decompression of the bladder by the Foley catheter. The previously seen dilatation is likely related to the overly distended bladder. Complex cyst in the upper pole of the right kidney stable from previous ultrasounds. Electronically Signed   By: [name] M.D.   On: 10/05/2019 08:39   DG CHEST PORT 1 VIEW  Result Date: 10/04/2019 CLINICAL DATA:  Pneumonia EXAM: PORTABLE CHEST 1 VIEW COMPARISON:  09/30/2018 FINDINGS: Right much greater than left pulmonary opacities. No significant pleural effusion. No pneumothorax. Stable cardiomediastinal contours. IMPRESSION: Increased right much greater than left pulmonary opacities, which may reflect edema or pneumonia. Electronically Signed   By: Macy Mis M.D.   On: 10/04/2019 11:21    Review of Systems negative except above she denies any abdominal pain Blood pressure 92/63, pulse (!) 109, temperature 98.2 F (36.8 C), resp. rate (!) 23, height 5\' 6"  (1.676 m), weight 67.7 kg, SpO2 90 %. Physical Exam vital  signs stable afebrile she at least tries to answer all questions in good spirits exam pertinent for her abdomen being soft nontender CT on admission without GI pathology BUN and creatinine increased today hemoglobin decreased about a point from her baseline INR increased on admission with normal liver tests not obviously iron deficient platelets okay Assessment/Plan: GI bleeding in a patient with multiple medical problems was on subcu heparin now stopped Plan: I discussed endoscopy with the patient just to be sure and I am happy to proceed today to assist with whether she can begin a diet or not and the swallowing team is available to make sure she is not aspirating which according to them she has not done since 2019  [facility] E 10/06/2019, 10:23 AM

## 2019-10-06 NOTE — Anesthesia Postprocedure Evaluation (Signed)
Anesthesia Post Note  Patient: Stephanie Olson  Procedure(s) Performed: ESOPHAGOGASTRODUODENOSCOPY (EGD) WITH PROPOFOL (N/A )     Patient location during evaluation: PACU Anesthesia Type: MAC Level of consciousness: awake and alert Pain management: pain level controlled Vital Signs Assessment: post-procedure vital signs reviewed and stable Respiratory status: spontaneous breathing, nonlabored ventilation and respiratory function stable Cardiovascular status: stable and blood pressure returned to baseline Anesthetic complications: no    Last Vitals:  Vitals:   10/06/19 1235 10/06/19 1400  BP: 94/68 (!) 89/58  Pulse:    Resp: 19 14  Temp:    SpO2: 95%     Last Pain:  Vitals:   10/06/19 1235  TempSrc:   PainSc: 0-No pain                 Audry Pili

## 2019-10-06 NOTE — Transfer of Care (Signed)
Immediate Anesthesia Transfer of Care Note  Patient: Stephanie Olson  Procedure(s) Performed: ESOPHAGOGASTRODUODENOSCOPY (EGD) WITH PROPOFOL (N/A )  Patient Location: PACU and Endoscopy Unit  Anesthesia Type:MAC  Level of Consciousness: drowsy  Airway & Oxygen Therapy: Patient Spontanous Breathing and Patient connected to face mask oxygen  Post-op Assessment: Report given to RN and Post -op Vital signs reviewed and stable  Post vital signs: Reviewed and stable  Last Vitals:  Vitals Value Taken Time  BP 92/65 10/06/19 1207  Temp 36.2 C 10/06/19 1202  Pulse 91 10/06/19 1202  Resp 20 10/06/19 1209  SpO2    Vitals shown include unvalidated device data.  Last Pain:  Vitals:   10/06/19 1202  TempSrc: Temporal  PainSc:          Complications: No apparent anesthesia complications

## 2019-10-06 NOTE — Op Note (Signed)
[facility] Patient Name: Stephanie Olson Procedure Date: 10/06/2019 MRN: [id number] Attending MD: Clarene Essex , MD Date of Birth: 09-10-1940 CSN: [id number] Age: 79 Admit Type: Inpatient Procedure:                Upper GI endoscopy Indications:              Melena Providers:                Clarene Essex, MD, Cleda Daub, RN, Elspeth Cho                            Tech., Technician, Stephanie [place] CRNA Referring MD:              Medicines:                Propofol total dose 30 mg IV, 100 mg of IV lidocaine Complications:            No immediate complications. Estimated Blood Loss:     Estimated blood loss: none. Procedure:                Pre-Anesthesia Assessment:                           - Prior to the procedure, a History and Physical                            was performed, and patient medications and                            allergies were reviewed. The patient's tolerance of                            previous anesthesia was also reviewed. The risks                            and benefits of the procedure and the sedation                            options and risks were discussed with the patient.                            All questions were answered, and informed consent                            was obtained. Prior Anticoagulants: The patient has                            taken heparin, last dose was day of procedure. ASA                            Grade Assessment: III - A patient with severe                            systemic disease. After reviewing the risks and  benefits, the patient was deemed in satisfactory                            condition to undergo the procedure.                           After obtaining informed consent, the endoscope was                            passed under direct vision. Throughout the                            procedure, the patient's blood pressure, pulse, and      oxygen saturations were monitored continuously. The                            [id number] Olympus gastroscope was                            introduced through the mouth, and advanced to the                            third part of duodenum. The upper GI endoscopy was                            accomplished without difficulty. The patient                            tolerated the procedure well. Scope In: Scope Out: Findings:      The larynx was normal.      The examined esophagus was normal.      A few dispersed diminutive erosions with no stigmata of recent bleeding       were found in the cardia, on the greater curvature of the stomach and on       the lesser curvature of the stomach.      Two non-bleeding cratered duodenal ulcers 1 medium size one large medium       1 with pigmented material in the larger one with a clot that could not       be washed off were found in the duodenal bulb and in the first portion       of the duodenum. Neither could be made to bleed with washing and       advancing the scope past them      The second portion of the duodenum and third portion of the duodenum       were normal.      The exam was otherwise without abnormality. Impression:               - Normal larynx.                           - Normal esophagus.                           - Erosive gastropathy with no stigmata of recent  bleeding.                           - Non-bleeding 2duodenal ulcers with pigmented                            material and formed clot.                           - Normal second portion of the duodenum and third                            portion of the duodenum.                           - The examination was otherwise normal.                           - No specimens collected. Moderate Sedation:      Not Applicable - Patient had care per Anesthesia. Recommendation:           - Clear liquid diet today. If okay with swallowing                             team but keep head of bed elevated to 90 degrees                            for at least 2 hours after eating- Continue present                            medications. Continue pump inhibitors long-term                           - Return to GI clinic PRN.                           - Telephone GI clinic if symptomatic PRN. Procedure Code(s):        --- Professional ---                           [phone], Esophagogastroduodenoscopy, flexible,                            transoral; diagnostic, including collection of                            specimen(s) by brushing or washing, when performed                            (separate procedure) Diagnosis Code(s):        --- Professional ---                           K31.89, Other diseases of stomach and duodenum  K26.9, Duodenal ulcer, unspecified as acute or                            chronic, without hemorrhage or perforation                           K92.1, Melena (includes Hematochezia) CPT copyright 2019 [employer]. All rights reserved. The codes documented in this report are preliminary and upon coder review may  be revised to meet current compliance requirements. Clarene Essex, MD 10/06/2019 12:05:52 PM This report has been signed electronically. Number of Addenda: 0

## 2019-10-06 NOTE — Progress Notes (Signed)
Pt much more alert now than it appears she was upon admission.  Talking more to RN, asking about her dentures saying her mouth is very dry and she is so very thirsty.  May pass SLP study Friday.

## 2019-10-07 LAB — CBC WITH DIFFERENTIAL/PLATELET
Abs Immature Granulocytes: 0.92 10*3/uL — ABNORMAL HIGH (ref 0.00–0.07)
Abs Immature Granulocytes: 1.23 10*3/uL — ABNORMAL HIGH (ref 0.00–0.07)
Basophils Absolute: 0.1 10*3/uL (ref 0.0–0.1)
Basophils Absolute: 0.1 10*3/uL (ref 0.0–0.1)
Basophils Relative: 0 %
Basophils Relative: 0 %
Eosinophils Absolute: 0 10*3/uL (ref 0.0–0.5)
Eosinophils Absolute: 0 10*3/uL (ref 0.0–0.5)
Eosinophils Relative: 0 %
Eosinophils Relative: 0 %
HCT: 25.2 % — ABNORMAL LOW (ref 36.0–46.0)
HCT: 24 % — ABNORMAL LOW (ref 36.0–46.0)
Hemoglobin: 7.5 g/dL — ABNORMAL LOW (ref 12.0–15.0)
Hemoglobin: 7.2 g/dL — ABNORMAL LOW (ref 12.0–15.0)
Immature Granulocytes: 5 %
Immature Granulocytes: 6 %
Lymphocytes Relative: 10 %
Lymphocytes Relative: 10 %
Lymphs Abs: 1.9 10*3/uL (ref 0.7–4.0)
Lymphs Abs: 2.1 10*3/uL (ref 0.7–4.0)
MCH: 28 pg (ref 26.0–34.0)
MCH: 27.9 pg (ref 26.0–34.0)
MCHC: 29.8 g/dL — ABNORMAL LOW (ref 30.0–36.0)
MCHC: 30 g/dL (ref 30.0–36.0)
MCV: 94 fL (ref 80.0–100.0)
MCV: 93 fL (ref 80.0–100.0)
Monocytes Absolute: 0.7 10*3/uL (ref 0.1–1.0)
Monocytes Absolute: 0.7 10*3/uL (ref 0.1–1.0)
Monocytes Relative: 4 %
Monocytes Relative: 4 %
Neutro Abs: 16.3 10*3/uL — ABNORMAL HIGH (ref 1.7–7.7)
Neutro Abs: 16.9 10*3/uL — ABNORMAL HIGH (ref 1.7–7.7)
Neutrophils Relative %: 81 %
Neutrophils Relative %: 80 %
Platelets: 163 10*3/uL (ref 150–400)
Platelets: 167 10*3/uL (ref 150–400)
RBC: 2.68 MIL/uL — ABNORMAL LOW (ref 3.87–5.11)
RBC: 2.58 MIL/uL — ABNORMAL LOW (ref 3.87–5.11)
RDW: 16.3 % — ABNORMAL HIGH (ref 11.5–15.5)
RDW: 16.1 % — ABNORMAL HIGH (ref 11.5–15.5)
WBC: 20 10*3/uL — ABNORMAL HIGH (ref 4.0–10.5)
WBC: 21 10*3/uL — ABNORMAL HIGH (ref 4.0–10.5)
nRBC: 0.6 % — ABNORMAL HIGH (ref 0.0–0.2)
nRBC: 0.7 % — ABNORMAL HIGH (ref 0.0–0.2)

## 2019-10-07 LAB — CBC
HCT: 24.9 % — ABNORMAL LOW (ref 36.0–46.0)
Hemoglobin: 7.5 g/dL — ABNORMAL LOW (ref 12.0–15.0)
MCH: 28.3 pg (ref 26.0–34.0)
MCHC: 30.1 g/dL (ref 30.0–36.0)
MCV: 94 fL (ref 80.0–100.0)
Platelets: 148 10*3/uL — ABNORMAL LOW (ref 150–400)
RBC: 2.65 MIL/uL — ABNORMAL LOW (ref 3.87–5.11)
RDW: 16.2 % — ABNORMAL HIGH (ref 11.5–15.5)
WBC: 19 10*3/uL — ABNORMAL HIGH (ref 4.0–10.5)
nRBC: 0.5 % — ABNORMAL HIGH (ref 0.0–0.2)

## 2019-10-07 LAB — PROTIME-INR
INR: 1.5 — ABNORMAL HIGH (ref 0.8–1.2)
Prothrombin Time: 18.4 seconds — ABNORMAL HIGH (ref 11.4–15.2)

## 2019-10-07 LAB — GLUCOSE, CAPILLARY
Glucose-Capillary: 184 mg/dL — ABNORMAL HIGH (ref 70–99)
Glucose-Capillary: 250 mg/dL — ABNORMAL HIGH (ref 70–99)
Glucose-Capillary: 292 mg/dL — ABNORMAL HIGH (ref 70–99)
Glucose-Capillary: 410 mg/dL — ABNORMAL HIGH (ref 70–99)
Glucose-Capillary: 365 mg/dL — ABNORMAL HIGH (ref 70–99)

## 2019-10-07 LAB — BASIC METABOLIC PANEL
Anion gap: 10 (ref 5–15)
BUN: 87 mg/dL — ABNORMAL HIGH (ref 8–23)
CO2: 18 mmol/L — ABNORMAL LOW (ref 22–32)
Calcium: 7.9 mg/dL — ABNORMAL LOW (ref 8.9–10.3)
Chloride: 115 mmol/L — ABNORMAL HIGH (ref 98–111)
Creatinine, Ser: 3.1 mg/dL — ABNORMAL HIGH (ref 0.44–1.00)
GFR calc Af Amer: 16 mL/min — ABNORMAL LOW (ref >60)
GFR calc non Af Amer: 14 mL/min — ABNORMAL LOW (ref >60)
Glucose, Bld: 229 mg/dL — ABNORMAL HIGH (ref 70–99)
Potassium: 4.2 mmol/L (ref 3.5–5.1)
Sodium: 143 mmol/L (ref 135–145)

## 2019-10-07 LAB — MAGNESIUM: Magnesium: 2 mg/dL (ref 1.7–2.4)

## 2019-10-07 LAB — BUN: BUN: 84 mg/dL — ABNORMAL HIGH (ref 8–23)

## 2019-10-07 LAB — PHOSPHORUS: Phosphorus: 3.4 mg/dL (ref 2.5–4.6)

## 2019-10-07 MED ORDER — PIPERACILLIN-TAZOBACTAM IN DEX 2-0.25 GM/50ML IV SOLN
2.2500 g | Freq: Three times a day (TID) | INTRAVENOUS | Status: DC
  Administered 2019-10-07 – 2019-10-14 (×20): 2.25 g via INTRAVENOUS
  Filled 2019-10-07 (×24): qty 50

## 2019-10-07 MED ORDER — SODIUM BICARBONATE-DEXTROSE 150-5 MEQ/L-% IV SOLN
150.0000 meq | INTRAVENOUS | Status: DC
  Administered 2019-10-07 – 2019-10-08 (×3): 150 meq via INTRAVENOUS
  Filled 2019-10-07 (×3): qty 1000

## 2019-10-07 MED ORDER — FLUDROCORTISONE ACETATE 0.1 MG PO TABS
0.1000 mg | ORAL_TABLET | Freq: Every day | ORAL | Status: DC
  Administered 2019-10-07 – 2019-10-14 (×8): 0.1 mg via ORAL
  Filled 2019-10-07 (×9): qty 1

## 2019-10-07 NOTE — Progress Notes (Signed)
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  Reportedly she is alert and oriented x 4 at base line and was willing to transfer back to assisted living from nursing home in near future.  In the emergency room, temperature 100.1.  Heart rate 114, respiratory 24, blood pressure 86/61.  On room air.  WBC count 14,000, procalcitonin 46, lactic acid 4.  Chest x-ray patchy bibasilar opacities, left greater than right.  CT abdomen pelvis with bilateral hydronephrosis with pyelonephritis and bladder distention.  Resuscitated, started on sepsis protocol and admitted to stepdown unit. 1/29: Mental status improving.  Started having maroon-colored stool with drop in hemoglobin, had EGD done showed non bleeding duodenal ulcer.  1/30: Mental status both are improving.  Renal functions fluctuate.  Foley functioning well and adequate urine output.   Assessment & Plan:   Principal Problem:   Severe sepsis ([place]) Active Problems:   Renal failure (ARF), acute on chronic (HCC)   Pressure injury of skin   Malnutrition of moderate degree   Lactic acidosis   Acute pyelonephritis   Hypotension   Hypokalemia   Chronic indwelling Foley catheter   Sinus tachycardia  Severe sepsis present on admission: Most likely from urinary source.  Also has multifocal pneumonia. Blood cultures with Proteus and providentia.  Urine cultures with Proteus and providencia.  Enterococcus cultures pending. Treated with broad-spectrum antibiotics, now on Zosyn adjusted with renal functions. Clinically improving.  WBC elevated, however also on steroids.  Echocardiogram  with ejection fraction 40%, no vegetations. Foley catheter was exchanged and is draining well.  Repeat renal ultrasound showed decompression of hydronephrosis. Patient started eating today, will change to oral [place]inef. If WBC count and creatinine does not improve, will need repeat CT abdomen tomorrow.  Acute metabolic encephalopathy: clinical improvement today.  Patient has started talking.  No focal deficit.  CT head negative.  Started on clear liquid diet. Speech to follow-up for advancing diet.  May have some underlying dementia, however she is more alert and oriented today.  Hypokalemia: Replaced.  Adequate today.  Recheck levels.  Acute kidney injury on chronic kidney disease stage IV: Previous available creatinine of 2 in the system.  With obstructive uropathy and chronic indwelling catheter.  Resuscitated with fluid.  Continue close monitoring.  Making adequate urine.  Creatinine is 3.19 today.  Her Foley catheter is draining. Will start patient on bicarbonate, recheck tomorrow morning, if persistently high will recheck CT abdomen.  Multifocal pneumonia: Aspiration versus fluid overload.  Clinically improving.  On antibiotics.  Normal swallowing now.  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  Draining well.  Repeat ultrasound with improvement.  Acute GI bleeding: Suspected upper GI bleeding with multiple episodes of maroon-colored stool.  She was also on steroids. Hemoglobin dropped from 9.6-8-7.5. On Protonix 40 mg IV twice daily. Underwent upper GI endoscopy, found to have duodenal ulcerations with no active bleeding. Continue PPI.  Mobilize today with PT OT.  Out of bed. DVT prophylaxis: SCDs Code Status: Full code Family Communication: Patient's son on the phone 1/29.  Unable to pick up on today. Disposition Plan: patient is from skilled nursing facility. Anticipated DC to skilled nursing facility, Barriers to discharge  active treatment for  sepsis.   Consultants:   Gastroenterology  Procedures:   EGD, 129  Antimicrobials:  Anti-infectives (From admission, onward)   Start     Dose/Rate Route Frequency Ordered Stop   10/07/19 2200  piperacillin-tazobactam (ZOSYN) IVPB 2.25 g     2.25 g 100 mL/hr over 30 Minutes Intravenous Every 8 hours 10/07/19 1258     10/03/19 2000  vancomycin (VANCOCIN) IVPB 1000 mg/200 mL premix  Status:  Discontinued     1,000 mg 200 mL/hr over 60 Minutes Intravenous Every 48 hours 10/02/19 0830 10/03/19 0832   10/03/19 1800  piperacillin-tazobactam (ZOSYN) IVPB 2.25 g  Status:  Discontinued     2.25 g 100 mL/hr over 30 Minutes Intravenous Every 6 hours 10/03/19 1426 10/07/19 1258   10/03/19 0831  vancomycin variable dose per unstable renal function (pharmacist dosing)  Status:  Discontinued      Does not apply See admin instructions 10/03/19 0832 10/03/19 1420   10/02/19 1200  ceFEPIme (MAXIPIME) 2 g in sodium chloride 0.9 % 100 mL IVPB  Status:  Discontinued     2 g 200 mL/hr over 30 Minutes Intravenous Every 24 hours 10/01/19 1605 10/03/19 1420   10/01/19 1615  vancomycin (VANCOREADY) IVPB 1500 mg/300 mL     1,500 mg 150 mL/hr over 120 Minutes Intravenous STAT 10/01/19 1606 10/01/19 1915   10/01/19 1607  vancomycin variable dose per unstable renal function (pharmacist dosing)  Status:  Discontinued      Does not apply See admin instructions 10/01/19 1607 10/02/19 0830   10/01/19 1245  ceFEPIme (MAXIPIME) 2 g in sodium chloride 0.9 % 100 mL IVPB     2 g 200 mL/hr over 30 Minutes Intravenous  Once 10/01/19 1233 10/01/19 1317   10/01/19 1245  metroNIDAZOLE (FLAGYL) IVPB 500 mg     500 mg 100 mL/hr over 60 Minutes Intravenous  Once 10/01/19 1233 10/01/19 1344         Subjective: Patient seen and examined.  More awake.  She was able to follow commands.  She was able to swallow.  She was able to answer her son's name, granddaughters name.  Still confused about time and place. No other  overnight events.  Tachycardia resolved. Nursing reported 2-3 loose bowel movements, mostly clearing up with maroon-tinged on it.  Remains afebrile.  Objective: Vitals:   10/07/19 0800 10/07/19 1000 10/07/19 1100 10/07/19 1200  BP: 92/63 (!) 88/55 (!) 88/60 96/75  Pulse: 100 (!) 42 (!) 101 (!) 51  Resp: 20 (!) 29 (!) 23   Temp: (!) 97.5 F (36.4 C) 98.4 F (36.9 C) 98.2 F (36.8 C) 97.9 F (36.6 C)  TempSrc:      SpO2: 98% 100% 100%   Weight:      Height:        Intake/Output Summary (Last 24 hours) at 10/07/2019 1326 Last data filed at 10/07/2019 1100 Gross per 24 hour  Intake 1496.32 ml  Output 325 ml  Net 1171.32 ml   Filed Weights   10/01/19 1234 10/01/19 1805  Weight: 72.2 kg 67.7 kg    Examination:  General exam: Appears calm, comfortable. Chronically sick looking.  Alert awake and interactive today. Respiratory system: Clear to auscultation. Respiratory effort normal.  No added sounds.  On room air. Cardiovascular system: S1 & S2 heard, tachycardic.  RRR. No pedal edema. Gastrointestinal system: Abdomen is nondistended, soft and nontender. No organomegaly or masses felt. Normal bowel sounds heard. No suprapubic tenderness or mass.  Foley catheter with free draining clear urine. Central nervous system: Alert and awake.  Weakness of all extremities.  No focal deficit.  Skin: No rashes, lesions or ulcers Psychiatry: Judgement and insight appear compromised.   Mood & affect flat.    Data Reviewed: I have personally reviewed following labs and imaging studies  CBC: Recent Labs  Lab 10/03/19 0049 10/03/19 0049 10/04/19 0204 10/04/19 0204 10/05/19 0210 10/05/19 0210 10/06/19 0238 10/06/19 0238 10/06/19 1000 10/06/19 1636 10/06/19 2155 10/07/19 0412 10/07/19 1225  WBC 22.2*   < > 21.3*  --  20.7*  --  18.9*  --   --   --   --  19.0* 20.0*  NEUTROABS 18.9*  --  17.6*  --  16.6*  --  14.4*  --   --   --   --   --  16.3*  HGB 9.5*   < > 9.7*   < > 10.6*   < >  8.6*   < > 8.5* 7.9* 7.5* 7.5* 7.5*  HCT 30.4*   < > 30.9*   < > 35.4*   < > 29.0*   < > 28.6* 26.5* 25.0* 24.9* 25.2*  MCV 91.8   < > 89.8  --  91.9  --  94.2  --   --   --   --  94.0 94.0  PLT 128*   < > 125*  --  151  --  152  --   --   --   --  148* 163   < > = values in this interval not displayed.   Basic Metabolic Panel: Recent Labs  Lab 10/02/19 0545 10/02/19 0545 10/03/19 0049 10/04/19 0204 10/05/19 0210 10/06/19 0238 10/07/19 0412  NA 140   < > 142 144 146* 146* 143  K 4.9   < > 4.8 4.3 4.9 4.7 4.2  CL 114*   < > 115* 115* 115* 117* 115*  CO2 19*   < > 18* 19* 16* 20* 18*  GLUCOSE 158*   < > 139* 115* 227* 256* 229*  BUN 55*   < > 60* 57* 66* 82* 87*  CREATININE 2.51*   < > 2.56* 2.27* 2.80* 2.99* 3.10*  CALCIUM 7.8*   < > 8.3* 8.2* 8.3* 8.1* 7.9*  MG 1.8  --  1.9  --   --  1.9 2.0  PHOS  --   --  3.0  --   --  3.3 3.4   < > = values in this interval not displayed.   GFR: Estimated Creatinine Clearance: 14.2 mL/min (A) (by C-G formula based on SCr of 3.1 mg/dL (H)). Liver Function Tests: Recent Labs  Lab 10/01/19 1211 10/02/19 0545  AST 14* 30  ALT 9 10  ALKPHOS 72 58  BILITOT 0.6 0.7  PROT 5.4* 5.3*  ALBUMIN 2.4* 2.3*   No results for input(s): LIPASE, AMYLASE in the last 168 hours. No results for input(s): AMMONIA in the last 168 hours. Coagulation Profile: Recent Labs  Lab 10/01/19 1840 10/07/19 0412  INR 2.0* 1.5*   Cardiac Enzymes: No results for input(s): CKTOTAL, CKMB, CKMBINDEX, TROPONINI in the last 168 hours. BNP (last 3 results) No results for input(s): PROBNP in the last 8760 hours. HbA1C: Recent Labs    10/05/19 1100  HGBA1C 5.9*   CBG: Recent Labs  Lab 10/06/19 0756 10/06/19 1600 10/06/19 2135 10/07/19 0748 10/07/19 1130  GLUCAP 238* 212* 279* 184* 250*   Lipid Profile: No results for  input(s): CHOL, HDL, LDLCALC, TRIG, CHOLHDL, LDLDIRECT in the last 72 hours. Thyroid Function Tests: No results for input(s): TSH,  T4TOTAL, FREET4, T3FREE, THYROIDAB in the last 72 hours. Anemia Panel: No results for input(s): VITAMINB12, FOLATE, FERRITIN, TIBC, IRON, RETICCTPCT in the last 72 hours. Sepsis Labs: Recent Labs  Lab 10/01/19 1840 10/01/19 2200 10/02/19 0229 10/02/19 0545 10/03/19 0049  PROCALCITON 46.82  --   --  68.27 54.86  LATICACIDVEN 4.4* 3.7* 2.1* 1.5  --     Recent Results (from the past 240 hour(s))  Blood Culture (routine x 2)     Status: Abnormal (Preliminary result)   Collection Time: 10/01/19 12:12 PM   Specimen: BLOOD  Result Value Ref Range Status   Specimen Description   Final    BLOOD RIGHT WRIST Performed at [facility] [address], [place], [place] [address]    Special Requests   Final    BOTTLES DRAWN AEROBIC AND ANAEROBIC Blood Culture results may not be optimal due to an inadequate volume of blood received in culture bottles Performed at [facility] [address] [place], [place] [address]    Culture  Setup Time   Final    GRAM POSITIVE COCCI GRAM NEGATIVE RODS CRITICAL VALUE NOTED.  VALUE IS CONSISTENT WITH PREVIOUSLY REPORTED AND CALLED VALUE. Performed at [facility], [place] [address], [place], [place] [address]    Culture (A)  Final    PROTEUS MIRABILIS PROVIDENCIA STUARTII ENTEROCOCCUS FAECALIS    Report Status PENDING  Incomplete  Blood Culture (routine x 2)     Status: Abnormal (Preliminary result)   Collection Time: 10/01/19 12:13 PM   Specimen: BLOOD LEFT FOREARM  Result Value Ref Range Status   Specimen Description   Final    BLOOD LEFT FOREARM Performed at [facility] [address] [place], [place] [address]    Special Requests   Final    BOTTLES DRAWN AEROBIC AND ANAEROBIC Blood Culture results may not be optimal due to an inadequate volume of blood received in culture bottles Performed at [facility] [address], [place], [place] [address]     Culture  Setup Time   Final    GRAM POSITIVE COCCI GRAM NEGATIVE RODS IN BOTH AEROBIC AND ANAEROBIC BOTTLES CRITICAL RESULT CALLED TO, READ BACK BY AND VERIFIED WITH: PHARMD A. PHAM 0840 [id number] FCP Performed at [facility] [address] [place], [place] [address]    Culture (A)  Final    PROTEUS MIRABILIS PROVIDENCIA STUARTII ENTEROCOCCUS FAECALIS    Report Status PENDING  Incomplete   Organism ID, Bacteria PROTEUS MIRABILIS  Final   Organism ID, Bacteria PROVIDENCIA STUARTII  Final      Susceptibility   Proteus mirabilis - MIC*    AMPICILLIN <=2 SENSITIVE Sensitive     CEFAZOLIN <=4 SENSITIVE Sensitive     CEFEPIME <=0.12 SENSITIVE Sensitive     CEFTAZIDIME <=1 SENSITIVE Sensitive     CEFTRIAXONE <=0.25 SENSITIVE Sensitive     CIPROFLOXACIN 1 SENSITIVE Sensitive     GENTAMICIN <=1 SENSITIVE Sensitive     IMIPENEM 4 SENSITIVE Sensitive     TRIMETH/SULFA <=20 SENSITIVE Sensitive     AMPICILLIN/SULBACTAM <=2 SENSITIVE Sensitive     PIP/TAZO <=4 SENSITIVE Sensitive     * PROTEUS MIRABILIS   Providencia stuartii - MIC*    AMPICILLIN >=32 RESISTANT Resistant     CEFAZOLIN >=64 RESISTANT Resistant  CEFEPIME <=0.12 SENSITIVE Sensitive     CEFTAZIDIME <=1 SENSITIVE Sensitive     CEFTRIAXONE <=0.25 SENSITIVE Sensitive     CIPROFLOXACIN >=4 RESISTANT Resistant     GENTAMICIN RESISTANT Resistant     IMIPENEM 1 SENSITIVE Sensitive     TRIMETH/SULFA >=320 RESISTANT Resistant     AMPICILLIN/SULBACTAM >=32 RESISTANT Resistant     PIP/TAZO <=4 SENSITIVE Sensitive     * PROVIDENCIA STUARTII  Blood Culture ID Panel (Reflexed)     Status: Abnormal   Collection Time: 10/01/19 12:13 PM  Result Value Ref Range Status   Enterococcus species DETECTED (A) NOT DETECTED Final    Comment: CRITICAL RESULT CALLED TO, READ BACK BY AND VERIFIED WITH: PHARMD A. PHAM 0840 [id number] FCP    Vancomycin resistance NOT DETECTED NOT DETECTED Final   Listeria monocytogenes NOT DETECTED NOT  DETECTED Final   Staphylococcus species NOT DETECTED NOT DETECTED Final   Staphylococcus aureus (BCID) NOT DETECTED NOT DETECTED Final   Streptococcus species NOT DETECTED NOT DETECTED Final   Streptococcus agalactiae NOT DETECTED NOT DETECTED Final   Streptococcus pneumoniae NOT DETECTED NOT DETECTED Final   Streptococcus pyogenes NOT DETECTED NOT DETECTED Final   Acinetobacter baumannii NOT DETECTED NOT DETECTED Final   Enterobacteriaceae species DETECTED (A) NOT DETECTED Final    Comment: Enterobacteriaceae represent a large family of gram-negative bacteria, not a single organism. CRITICAL RESULT CALLED TO, READ BACK BY AND VERIFIED WITH: PHARMD A. PHAM [id number] FCP    Enterobacter cloacae complex NOT DETECTED NOT DETECTED Final   Escherichia coli NOT DETECTED NOT DETECTED Final   Klebsiella oxytoca NOT DETECTED NOT DETECTED Final   Klebsiella pneumoniae NOT DETECTED NOT DETECTED Final   Proteus species DETECTED (A) NOT DETECTED Final    Comment: CRITICAL RESULT CALLED TO, READ BACK BY AND VERIFIED WITH: PHARMD A. PHAM 0840 [id number] FCP    Serratia marcescens NOT DETECTED NOT DETECTED Final   Carbapenem resistance NOT DETECTED NOT DETECTED Final   Haemophilus influenzae NOT DETECTED NOT DETECTED Final   Neisseria meningitidis NOT DETECTED NOT DETECTED Final   Pseudomonas aeruginosa NOT DETECTED NOT DETECTED Final   Candida albicans NOT DETECTED NOT DETECTED Final   Candida glabrata NOT DETECTED NOT DETECTED Final   Candida krusei NOT DETECTED NOT DETECTED Final   Candida parapsilosis NOT DETECTED NOT DETECTED Final   Candida tropicalis NOT DETECTED NOT DETECTED Final    Comment: Performed at [facility], [place] [address], [place], [place] [address]  Urine culture     Status: Abnormal   Collection Time: 10/01/19  2:27 PM   Specimen: In/Out Cath Urine  Result Value Ref Range Status   Specimen Description   Final    IN/OUT CATH URINE Performed at [facility] [address] [place], [place] [address]    Special Requests   Final    NONE Performed at [facility], [place] [address] [place], [place] [address]    Culture (A)  Final    >=100,000 COLONIES/mL PROVIDENCIA STUARTII >=100,000 COLONIES/mL PROTEUS MIRABILIS    Report Status 10/05/2019 FINAL  Final   Organism ID, Bacteria PROVIDENCIA STUARTII (A)  Final   Organism ID, Bacteria PROTEUS MIRABILIS (A)  Final      Susceptibility   Proteus mirabilis - MIC*    AMPICILLIN <=2 SENSITIVE Sensitive     CEFAZOLIN <=4 SENSITIVE Sensitive     CEFTRIAXONE <=0.25 SENSITIVE Sensitive     CIPROFLOXACIN 1 SENSITIVE Sensitive  GENTAMICIN <=1 SENSITIVE Sensitive     IMIPENEM 8 INTERMEDIATE Intermediate     NITROFURANTOIN 256 RESISTANT Resistant     TRIMETH/SULFA <=20 SENSITIVE Sensitive     AMPICILLIN/SULBACTAM <=2 SENSITIVE Sensitive     PIP/TAZO <=4 SENSITIVE Sensitive     * >=100,000 COLONIES/mL PROTEUS MIRABILIS   Providencia stuartii - MIC*    AMPICILLIN >=32 RESISTANT Resistant     CEFAZOLIN >=64 RESISTANT Resistant     CEFTRIAXONE <=0.25 SENSITIVE Sensitive     CIPROFLOXACIN >=4 RESISTANT Resistant     GENTAMICIN RESISTANT Resistant     IMIPENEM 2 SENSITIVE Sensitive     NITROFURANTOIN 128 RESISTANT Resistant     TRIMETH/SULFA >=320 RESISTANT Resistant     AMPICILLIN/SULBACTAM >=32 RESISTANT Resistant     PIP/TAZO <=4 SENSITIVE Sensitive     * >=100,000 COLONIES/mL PROVIDENCIA STUARTII  Respiratory Panel by RT PCR (Flu A&B, Covid) - Urine, Catheterized     Status: None   Collection Time: 10/01/19  2:27 PM   Specimen: Urine, Catheterized  Result Value Ref Range Status   SARS Coronavirus 2 by RT PCR NEGATIVE NEGATIVE Final    Comment: (NOTE) SARS-CoV-2 target nucleic acids are NOT DETECTED. The SARS-CoV-2 RNA is generally detectable in upper respiratoy specimens during the acute phase of infection. The lowest concentration of SARS-CoV-2 viral copies this  assay can detect is 131 copies/mL. A negative result does not preclude SARS-Cov-2 infection and should not be used as the sole basis for treatment or other patient management decisions. A negative result may occur with  improper specimen collection/handling, submission of specimen other than nasopharyngeal swab, presence of viral mutation(s) within the areas targeted by this assay, and inadequate number of viral copies (<131 copies/mL). A negative result must be combined with clinical observations, patient history, and epidemiological information. The expected result is Negative. Fact Sheet for Patients:  PinkCheek.be Fact Sheet for Healthcare Providers:  GravelBags.it This test is not yet ap proved or cleared by the [place] FDA and  has been authorized for detection and/or diagnosis of SARS-CoV-2 by FDA under an Emergency Use Authorization (EUA). This EUA will remain  in effect (meaning this test can be used) for the duration of the COVID-19 declaration under Section 564(b)(1) of the Act, 21 U.S.C. section 360bbb-3(b)(1), unless the authorization is terminated or revoked sooner.    Influenza A by PCR NEGATIVE NEGATIVE Final   Influenza B by PCR NEGATIVE NEGATIVE Final    Comment: (NOTE) The Xpert Xpress SARS-CoV-2/FLU/RSV assay is intended as an aid in  the diagnosis of influenza from Nasopharyngeal swab specimens and  should not be used as a sole basis for treatment. Nasal washings and  aspirates are unacceptable for Xpert Xpress SARS-CoV-2/FLU/RSV  testing. Fact Sheet for Patients: PinkCheek.be Fact Sheet for Healthcare Providers: GravelBags.it This test is not yet approved or cleared by the [place] FDA and  has been authorized for detection and/or diagnosis of SARS-CoV-2 by  FDA under an Emergency Use Authorization (EUA). This EUA will remain  in  effect (meaning this test can be used) for the duration of the  Covid-19 declaration under Section 564(b)(1) of the Act, 21  U.S.C. section 360bbb-3(b)(1), unless the authorization is  terminated or revoked. Performed at [facility], [place] [address], [place], [place] [address]   MRSA PCR Screening     Status: None   Collection Time: 10/01/19  6:28 PM   Specimen: Nasopharyngeal  Result Value Ref Range Status   MRSA by  PCR NEGATIVE NEGATIVE Final    Comment:        The GeneXpert MRSA Assay (FDA approved for NASAL specimens only), is one component of a comprehensive MRSA colonization surveillance program. It is not intended to diagnose MRSA infection nor to guide or monitor treatment for MRSA infections. Performed at [facility], [place] [address] [place], [place] [address]   Culture, blood (routine x 2)     Status: None (Preliminary result)   Collection Time: 10/03/19 12:49 AM   Specimen: BLOOD  Result Value Ref Range Status   Specimen Description   Final    BLOOD RIGHT ARM Performed at [facility] [address] [place], [place] [address]    Special Requests   Final    BOTTLES DRAWN AEROBIC ONLY Blood Culture adequate volume Performed at [facility] [address], [place], [place] [address]    Culture   Final    NO GROWTH 3 DAYS Performed at [facility], [place] [address], [place], [place] [address]    Report Status PENDING  Incomplete  Culture, blood (routine x 2)     Status: None (Preliminary result)   Collection Time: 10/03/19 12:49 AM   Specimen: BLOOD  Result Value Ref Range Status   Specimen Description   Final    BLOOD RIGHT HAND Performed at [facility] [address], [place], [place] [address]    Special Requests   Final    BOTTLES DRAWN AEROBIC ONLY Blood Culture adequate volume Performed at [facility] [address]  [place], [place] [address]    Culture   Final    NO GROWTH 3 DAYS Performed at [facility], [place] [address], [place], [place] [address]    Report Status PENDING  Incomplete         Radiology Studies: No results found.      Scheduled Meds: . Chlorhexidine Gluconate Cloth  6 each Topical Daily  . DULoxetine  20 mg Oral Daily  . fludrocortisone  0.1 mg Oral Daily  . insulin aspart  0-5 Units Subcutaneous QHS  . insulin aspart  0-9 Units Subcutaneous TID WC  . mouth rinse  15 mL Mouth Rinse BID  . pantoprazole (PROTONIX) IV  40 mg Intravenous Q12H   Continuous Infusions: . piperacillin-tazobactam (ZOSYN)  IV    . sodium bicarbonate 150 mEq in dextrose 5% 1000 mL 100 mL/hr at 10/07/19 1100     LOS: 6 days    Time spent: 25 minutes    Barb Merino, MD [facility] Pager [phone]

## 2019-10-07 NOTE — Progress Notes (Signed)
OT Note:  Received another OT referral. Pt is from SNF and has dementia. Will defer OT intervention back to that venue.  Karsten Ro, OTR/L Acute Rehabilitation Services 10/07/2019

## 2019-10-07 NOTE — Progress Notes (Signed)
Subjective: Denies abdominal pain. Denies blood in stool.  Objective: Vital signs in last 24 hours: Temp:  [97 F (36.1 C)-98.4 F (36.9 C)] 97.9 F (36.6 C) (01/30 1200) Pulse Rate:  [42-101] 51 (01/30 1200) Resp:  [10-29] 23 (01/30 1100) BP: (86-103)/(55-75) 96/75 (01/30 1200) SpO2:  [96 %-100 %] 100 % (01/30 1100) Weight change:  Last BM Date: 10/07/19  PE: GEN:  NAD, overweight ABD:  Soft, non-tender  Lab Results: CBC    Component Value Date/Time   WBC 20.0 (H) 10/07/2019 1225   RBC 2.68 (L) 10/07/2019 1225   HGB 7.5 (L) 10/07/2019 1225   HCT 25.2 (L) 10/07/2019 1225   PLT 163 10/07/2019 1225   MCV 94.0 10/07/2019 1225   MCH 28.0 10/07/2019 1225   MCHC 29.8 (L) 10/07/2019 1225   RDW 16.3 (H) 10/07/2019 1225   LYMPHSABS 1.9 10/07/2019 1225   MONOABS 0.7 10/07/2019 1225   EOSABS 0.0 10/07/2019 1225   BASOSABS 0.1 10/07/2019 1225   CMP     Component Value Date/Time   NA 143 10/07/2019 0412   K 4.2 10/07/2019 0412   CL 115 (H) 10/07/2019 0412   CO2 18 (L) 10/07/2019 0412   GLUCOSE 229 (H) 10/07/2019 0412   BUN 87 (H) 10/07/2019 0412   CREATININE 3.10 (H) 10/07/2019 0412   CALCIUM 7.9 (L) 10/07/2019 0412   PROT 5.3 (L) 10/02/2019 0545   ALBUMIN 2.3 (L) 10/02/2019 0545   AST 30 10/02/2019 0545   ALT 10 10/02/2019 0545   ALKPHOS 58 10/02/2019 0545   BILITOT 0.7 10/02/2019 0545   GFRNONAA 14 (L) 10/07/2019 0412   GFRAA 16 (L) 10/07/2019 0412   Assessment:  1.  Sepsis syndrome with urosepsis and multifocal pneumonia. 2.  Altered mental status, likely at least in part from #1 above. 3.  Acute blood loss anemia.  Hgb stable over past couple days. 4.  GI bleeding, resolved, with gastric erosion and duodenal ulcer seen on endoscopy yesterday.  Unclear etiology to ulcer/erosion.  Plan:  1.  OK to continue IV pantoprazole for another day, transition to oral tomorrow (pantoprazole 40 mg po bid qac x 6 weeks, then 40 mg po qd qac thereafter indefinitely). 2.   Follow CBC, transfuse as needed. 3.  Check H. Pylori serologies. 4.  [facility] will follow.   Stephanie Olson 10/07/2019, 3:55 PM   Cell [phone] If no answer or after 5 PM call [phone]

## 2019-10-07 NOTE — Progress Notes (Signed)
Pharmacy Antibiotic Note  Stephanie Olson is a 79 y.o. female admitted on 10/01/2019 with septic shock secondary to acute bilateral pyelonephritis complicated by nonfunctioning chronic indwelling Foley catheter. Pharmacy has been consulted for Zosyn  Today, 10/07/19  D7 total Antibiotics, D5 Zosyn - ID recommends 14 days  Afebrile, WBC remains elevated but stable  SCr continues to rise - 3.19  Blood cultures: providencia and proteus final but enterococcus still pending  Urine cx with proteus and providencia  Rpt blood cx no growth  Plan  Due to continuing rise in SCr, will change Zosyn from 2.25g IV q6 to 2.25g IV q8  Await final culture data   Monitor renal function, cultures, clinical course.   Height: 5\' 6"  (167.6 cm) Weight: 149 lb 4 oz (67.7 kg) IBW/kg (Calculated) : 59.3  Temp (24hrs), Avg:97.8 F (36.6 C), Min:97 F (36.1 C), Max:98.4 F (36.9 C)  Recent Labs  Lab 10/01/19 1211 10/01/19 1407 10/01/19 1840 10/01/19 1840 10/01/19 2200 10/02/19 0229 10/02/19 0545 10/02/19 0545 10/03/19 0049 10/04/19 0204 10/05/19 0210 10/06/19 0238 10/07/19 0412  WBC   < >  --  20.7*   < >  --   --  19.3*   < > 22.2* 21.3* 20.7* 18.9* 19.0*  CREATININE   < >  --  2.93*   < > 2.61*  --  2.51*   < > 2.56* 2.27* 2.80* 2.99* 3.10*  LATICACIDVEN  --  3.3* 4.4*  --  3.7* 2.1* 1.5  --   --   --   --   --   --    < > = values in this interval not displayed.    Estimated Creatinine Clearance: 14.2 mL/min (A) (by C-G formula based on SCr of 3.1 mg/dL (H)).    No Known Allergies  Antimicrobials this admission: 1/24 Metronidazole x 1 1/24 Vancomycin >> 1/26 1/24 Cefepime >> 1/26 1/26 Zosyn >>   Dose adjustments/drug levels this admission:   Microbiology results: 1/24 BCx: per BCID 4/4 bottles - enterococcus & proteus BCID, + Providencia (cx) 1/24 UCx: >100k proteus and providencia 1/24 COVID: negative 1/24 Influenza A/B: negative 1/26 repeat BCx: ngtd  Thank you for  allowing pharmacy to be a part of this patient's care.  Adrian Saran, PharmD, BCPS 10/07/2019 12:56 PM

## 2019-10-07 NOTE — Progress Notes (Signed)
  Speech Language Pathology Treatment: Dysphagia  Patient Details Name: Stephanie Olson MRN: [id number] DOB: [birth year] Today's Date: 10/07/2019 Time: [id number] SLP Time Calculation (min) (ACUTE ONLY): 25 min  Assessment / Plan / Recommendation Clinical Impression  Today pt fuly alert sitting up in chair and consuming intake!  She is self feeding and although her swallowing is delayed * suspect oral more than pharyngeal * she does not demonstrate any indications of aspiration.  She continued to benefit from cues to swallow before adding mroe to her mouth - and liquid helped to faciliate oral clearance. Pt appears appropriate to advance to dys3/thin when GI approves. Advise intermittent supervision as pt eats slowly and self feeding will be safer for her.       HPI: 79 yo female adm to [facility] from [place] with AMS-  metabolic encephalopathy due to pyelonephritis complicated by malfunctioning catheter - foley catheter was replaced.  Pt has not been appropriate to accept po intake and SlP was consulted.  [name] RN reports removing dentures for care was difficult due to pt's mentation.  Pt CT chest showed right more than left consolidations concerning for pna, ? COVID 19 per results.  Pt also with h/o DM - diet controlled, aspiration into airway in 04/2018, smoker, FTT.  Pt has h/o dysphagia with oral holding, delay in swallow with SLP up to 45 seconds with diet recommendations of liquids only during 04/2018 admit.  Today pt seen to determine readiness for diet. RN reports pt aspirated this am when taking her po medications with water. Now is congested and appears with increased facial asymmetry per RN.      SLP Plan  Continue with current plan of care       Recommendations  Diet recommendations: Dysphagia 3 (mechanical soft);Thin liquid Liquids provided via: Cup;Straw Medication Administration: Whole meds with puree Supervision: Patient able to self feed;Full supervision/cueing for compensatory  strategies Compensations: Slow rate;Small sips/bites;Follow solids with liquid Postural Changes and/or Swallow Maneuvers: Seated upright 90 degrees;Upright 30-60 min after meal                Oral Care Recommendations: Oral care BID Follow up Recommendations: Skilled Nursing facility SLP Visit Diagnosis: Dysphagia, oropharyngeal phase (R13.12) Plan: Continue with current plan of care       GO                Macario Golds 10/07/2019, 1:47 PM  [name], MS [place] Office [phone]

## 2019-10-07 NOTE — Evaluation (Signed)
Physical Therapy Evaluation Patient Details Name: Stephanie Olson MRN: [id number] DOB: 09-07-1941 Today's Date: 10/07/2019   History of Present Illness  79 yo female admitted from SNF with severe sepsis, AMS. Hx of DM, neuropathy, chronic foley, CKD  Clinical Impression  Pt admitted as above and presenting with functional mobility limitations 2* generalized weakness, balance deficits and dementia related cognitive deficits.  Pt would most benefit from return to previous SNF residence.    Follow Up Recommendations SNF    Equipment Recommendations  None recommended by PT    Recommendations for Other Services       Precautions / Restrictions Precautions Precautions: Fall Restrictions Weight Bearing Restrictions: No      Mobility  Bed Mobility Overal bed mobility: Needs Assistance Bed Mobility: Supine to Sit;Rolling Rolling: Mod assist   Supine to sit: Mod assist     General bed mobility comments: assist to manage LE over side of bed and to bring trunk to upright  Transfers Overall transfer level: Needs assistance Equipment used: Rolling walker (2 wheeled) Transfers: Sit to/from [facility] Sit to Stand: Mod assist;+2 physical assistance;+2 safety/equipment;From elevated surface Stand pivot transfers: Mod assist;+2 physical assistance;+2 safety/equipment       General transfer comment: Assist to bring wt up and fwd and to balance in standing with RW prior to performing stand/pvt and pulling chair up behind.  Ambulation/Gait             General Gait Details: Stand pvt only with RW and buckling at knees  Stairs            Wheelchair Mobility    Modified Rankin (Stroke Patients Only)       Balance Overall balance assessment: Needs assistance Sitting-balance support: Bilateral upper extremity supported;Feet supported Sitting balance-Leahy Scale: Fair     Standing balance support: Bilateral upper extremity supported Standing  balance-Leahy Scale: Poor                               Pertinent Vitals/Pain Pain Assessment: No/denies pain    Home Living Family/patient expects to be discharged to:: Skilled nursing facility                 Additional Comments: resident of [facility]    Prior Function           Comments: Pt states she ambulates IND but is not reliable historian     Hand Dominance   Dominant Hand: Right    Extremity/Trunk Assessment   Upper Extremity Assessment Upper Extremity Assessment: Generalized weakness    Lower Extremity Assessment Lower Extremity Assessment: Generalized weakness    Cervical / Trunk Assessment Cervical / Trunk Assessment: Kyphotic  Communication   Communication: HOH  Cognition Arousal/Alertness: Awake/alert Behavior During Therapy: Flat affect Overall Cognitive Status: No family/caregiver present to determine baseline cognitive functioning                                 General Comments: Pt following single step cues but not consistently      General Comments      Exercises     Assessment/Plan    PT Assessment Patient needs continued PT services  PT Problem List Decreased strength;Decreased activity tolerance;Decreased balance;Decreased mobility;Decreased cognition;Decreased knowledge of use of DME       PT Treatment Interventions DME instruction;Gait training;Functional mobility training;Therapeutic activities;Therapeutic exercise;Balance training;Patient/family  education    PT Goals (Current goals can be found in the Care Plan section)  Acute Rehab PT Goals Patient Stated Goal: get up to chair PT Goal Formulation: Patient unable to participate in goal setting Time For Goal Achievement: 10/21/19 Potential to Achieve Goals: Fair    Frequency Min 2X/week   Barriers to discharge        Co-evaluation               AM-PAC PT "6 Clicks" Mobility  Outcome Measure Help needed turning from your  back to your side while in a flat bed without using bedrails?: A Lot Help needed moving from lying on your back to sitting on the side of a flat bed without using bedrails?: A Lot Help needed moving to and from a bed to a chair (including a wheelchair)?: A Lot Help needed standing up from a chair using your arms (e.g., wheelchair or bedside chair)?: A Lot Help needed to walk in hospital room?: Total Help needed climbing 3-5 steps with a railing? : Total 6 Click Score: 10    End of Session Equipment Utilized During Treatment: Gait belt Activity Tolerance: Patient limited by fatigue Patient left: in chair;with call bell/phone within reach Nurse Communication: Mobility status PT Visit Diagnosis: Muscle weakness (generalized) (M62.81);Difficulty in walking, not elsewhere classified (R26.2)    Time: [id number] PT Time Calculation (min) (ACUTE ONLY): 50 min   Charges:   PT Evaluation $PT Re-evaluation: 1 Re-eval PT Treatments $Therapeutic Activity: 23-37 mins        Debe Coder PT Acute Rehabilitation Services Pager [phone] Office [phone]   Deeric Cruise 10/07/2019, 12:18 PM

## 2019-10-08 LAB — CBC WITH DIFFERENTIAL/PLATELET
Abs Immature Granulocytes: 1.26 10*3/uL — ABNORMAL HIGH (ref 0.00–0.07)
Abs Immature Granulocytes: 1.39 10*3/uL — ABNORMAL HIGH (ref 0.00–0.07)
Basophils Absolute: 0.1 10*3/uL (ref 0.0–0.1)
Basophils Absolute: 0.1 10*3/uL (ref 0.0–0.1)
Basophils Relative: 0 %
Basophils Relative: 0 %
Eosinophils Absolute: 0 10*3/uL (ref 0.0–0.5)
Eosinophils Absolute: 0 10*3/uL (ref 0.0–0.5)
Eosinophils Relative: 0 %
Eosinophils Relative: 0 %
HCT: 25 % — ABNORMAL LOW (ref 36.0–46.0)
HCT: 24.9 % — ABNORMAL LOW (ref 36.0–46.0)
Hemoglobin: 7.5 g/dL — ABNORMAL LOW (ref 12.0–15.0)
Hemoglobin: 7.7 g/dL — ABNORMAL LOW (ref 12.0–15.0)
Immature Granulocytes: 6 %
Immature Granulocytes: 6 %
Lymphocytes Relative: 11 %
Lymphocytes Relative: 14 %
Lymphs Abs: 2.2 10*3/uL (ref 0.7–4.0)
Lymphs Abs: 3.5 10*3/uL (ref 0.7–4.0)
MCH: 28 pg (ref 26.0–34.0)
MCH: 28.5 pg (ref 26.0–34.0)
MCHC: 30 g/dL (ref 30.0–36.0)
MCHC: 30.9 g/dL (ref 30.0–36.0)
MCV: 93.3 fL (ref 80.0–100.0)
MCV: 92.2 fL (ref 80.0–100.0)
Monocytes Absolute: 0.8 10*3/uL (ref 0.1–1.0)
Monocytes Absolute: 1.2 10*3/uL — ABNORMAL HIGH (ref 0.1–1.0)
Monocytes Relative: 4 %
Monocytes Relative: 5 %
Neutro Abs: 16.4 10*3/uL — ABNORMAL HIGH (ref 1.7–7.7)
Neutro Abs: 18.3 10*3/uL — ABNORMAL HIGH (ref 1.7–7.7)
Neutrophils Relative %: 79 %
Neutrophils Relative %: 75 %
Platelets: 192 10*3/uL (ref 150–400)
Platelets: 194 10*3/uL (ref 150–400)
RBC: 2.68 MIL/uL — ABNORMAL LOW (ref 3.87–5.11)
RBC: 2.7 MIL/uL — ABNORMAL LOW (ref 3.87–5.11)
RDW: 16 % — ABNORMAL HIGH (ref 11.5–15.5)
RDW: 15.9 % — ABNORMAL HIGH (ref 11.5–15.5)
WBC: 20.7 10*3/uL — ABNORMAL HIGH (ref 4.0–10.5)
WBC: 24.5 10*3/uL — ABNORMAL HIGH (ref 4.0–10.5)
nRBC: 1.1 % — ABNORMAL HIGH (ref 0.0–0.2)
nRBC: 1.8 % — ABNORMAL HIGH (ref 0.0–0.2)

## 2019-10-08 LAB — CULTURE, BLOOD (ROUTINE X 2)
Culture: NO GROWTH
Culture: NO GROWTH
Special Requests: ADEQUATE
Special Requests: ADEQUATE

## 2019-10-08 LAB — PROCALCITONIN: Procalcitonin: 6.93 ng/mL

## 2019-10-08 LAB — GLUCOSE, CAPILLARY
Glucose-Capillary: 443 mg/dL — ABNORMAL HIGH (ref 70–99)
Glucose-Capillary: 351 mg/dL — ABNORMAL HIGH (ref 70–99)
Glucose-Capillary: 231 mg/dL — ABNORMAL HIGH (ref 70–99)
Glucose-Capillary: 77 mg/dL (ref 70–99)

## 2019-10-08 LAB — LIPID PANEL
Cholesterol: 166 mg/dL (ref 0–200)
HDL: 28 mg/dL — ABNORMAL LOW (ref >40)
LDL Cholesterol: 95 mg/dL (ref 0–99)
Total CHOL/HDL Ratio: 5.9 RATIO
Triglycerides: 217 mg/dL — ABNORMAL HIGH (ref <150)
VLDL: 43 mg/dL — ABNORMAL HIGH (ref 0–40)

## 2019-10-08 LAB — BASIC METABOLIC PANEL
Anion gap: 13 (ref 5–15)
BUN: 80 mg/dL — ABNORMAL HIGH (ref 8–23)
CO2: 20 mmol/L — ABNORMAL LOW (ref 22–32)
Calcium: 7.5 mg/dL — ABNORMAL LOW (ref 8.9–10.3)
Chloride: 102 mmol/L (ref 98–111)
Creatinine, Ser: 3.05 mg/dL — ABNORMAL HIGH (ref 0.44–1.00)
GFR calc Af Amer: 16 mL/min — ABNORMAL LOW (ref >60)
GFR calc non Af Amer: 14 mL/min — ABNORMAL LOW (ref >60)
Glucose, Bld: 378 mg/dL — ABNORMAL HIGH (ref 70–99)
Potassium: 3.9 mmol/L (ref 3.5–5.1)
Sodium: 135 mmol/L (ref 135–145)

## 2019-10-08 LAB — HEMOGLOBIN A1C
Hgb A1c MFr Bld: 6.4 % — ABNORMAL HIGH (ref 4.8–5.6)
Mean Plasma Glucose: 136.98 mg/dL

## 2019-10-08 LAB — PHOSPHORUS: Phosphorus: 3.3 mg/dL (ref 2.5–4.6)

## 2019-10-08 LAB — MAGNESIUM: Magnesium: 1.8 mg/dL (ref 1.7–2.4)

## 2019-10-08 LAB — LACTIC ACID, PLASMA: Lactic Acid, Venous: 2.9 mmol/L (ref 0.5–1.9)

## 2019-10-08 MED ORDER — INSULIN GLARGINE 100 UNIT/ML ~~LOC~~ SOLN
15.0000 [IU] | Freq: Two times a day (BID) | SUBCUTANEOUS | Status: DC
  Administered 2019-10-08: 15 [IU] via SUBCUTANEOUS
  Filled 2019-10-08 (×2): qty 0.15

## 2019-10-08 MED ORDER — STERILE WATER FOR INJECTION IV SOLN
INTRAVENOUS | Status: DC
  Filled 2019-10-08: qty 850

## 2019-10-08 MED ORDER — INSULIN ASPART 100 UNIT/ML ~~LOC~~ SOLN
7.0000 [IU] | Freq: Three times a day (TID) | SUBCUTANEOUS | Status: DC
  Administered 2019-10-08 (×2): 7 [IU] via SUBCUTANEOUS

## 2019-10-08 MED ORDER — ASPIRIN EC 81 MG PO TBEC
81.0000 mg | DELAYED_RELEASE_TABLET | Freq: Every day | ORAL | Status: DC
  Administered 2019-10-08 – 2019-10-10 (×3): 81 mg via ORAL
  Filled 2019-10-08 (×3): qty 1

## 2019-10-08 MED ORDER — FUROSEMIDE 20 MG PO TABS
20.0000 mg | ORAL_TABLET | Freq: Every day | ORAL | Status: DC
  Administered 2019-10-08 – 2019-10-12 (×5): 20 mg via ORAL
  Filled 2019-10-08 (×5): qty 1

## 2019-10-08 MED ORDER — PANTOPRAZOLE SODIUM 40 MG PO TBEC
40.0000 mg | DELAYED_RELEASE_TABLET | Freq: Two times a day (BID) | ORAL | Status: DC
  Administered 2019-10-08 – 2019-10-14 (×12): 40 mg via ORAL
  Filled 2019-10-08 (×12): qty 1

## 2019-10-08 MED ORDER — PRAVASTATIN SODIUM 20 MG PO TABS
10.0000 mg | ORAL_TABLET | Freq: Every day | ORAL | Status: DC
  Administered 2019-10-08: 10 mg via ORAL
  Filled 2019-10-08: qty 1

## 2019-10-08 MED ORDER — INSULIN ASPART 100 UNIT/ML ~~LOC~~ SOLN
0.0000 [IU] | Freq: Three times a day (TID) | SUBCUTANEOUS | Status: DC
  Administered 2019-10-08: 13:00:00 15 [IU] via SUBCUTANEOUS
  Administered 2019-10-08: 8 [IU] via SUBCUTANEOUS

## 2019-10-08 MED ORDER — SODIUM BICARBONATE 650 MG PO TABS
1300.0000 mg | ORAL_TABLET | Freq: Two times a day (BID) | ORAL | Status: DC
  Administered 2019-10-08 – 2019-10-09 (×3): 1300 mg via ORAL
  Filled 2019-10-08 (×3): qty 2

## 2019-10-08 MED ORDER — ASPIRIN 81 MG PO CHEW: 81.0000 mg | CHEWABLE_TABLET | Freq: Every day | ORAL | Status: DC

## 2019-10-08 MED ORDER — ATORVASTATIN CALCIUM 10 MG PO TABS: 20.0000 mg | ORAL_TABLET | Freq: Every day | ORAL | Status: DC

## 2019-10-08 NOTE — Progress Notes (Addendum)
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  Reportedly she is alert and oriented x 4 at base line and was willing to transfer back to assisted living from nursing home in near future.  In the emergency room, temperature 100.1.  Heart rate 114, respiratory 24, blood pressure 86/61.  On room air.  WBC count 14,000, procalcitonin 46, lactic acid 4.  Chest x-ray patchy bibasilar opacities, left greater than right.  CT abdomen pelvis with bilateral hydronephrosis with pyelonephritis and bladder distention.  Resuscitated, started on sepsis protocol and admitted to stepdown unit. 1/29: Mental status improving.  Started having maroon-colored stool with drop in hemoglobin, had EGD done showed non bleeding duodenal ulcer.  1/30: Mental status both are improving.  Renal functions fluctuate.  Foley functioning well and adequate urine output.  10/08/19: Seen and examined. She is alert but confused. Per medical record no prior history of dementia. She has no new complaints. Seen by GI, signed off.   Assessment/Plan: Principal Problem:   Severe sepsis (HCC) Active Problems:   Renal failure (ARF), acute on chronic (HCC)   Pressure injury of skin   Malnutrition of moderate degree   Lactic acidosis   Acute pyelonephritis   Hypotension   Hypokalemia   Chronic indwelling Foley catheter   Sinus tachycardia  Severe sepsis secondary to polymicrobial bacteremia likely secondary to a urinary source Presented with leukocytosis, tachycardia, tachypnea with positive urine analysis, urine culture, and blood cultures with significantly elevated  procalcitonin 68 which is trending down to 6. Blood cultures drawn on 10/01/2019 + for Proteus mirabilis, Providencia stuartii, Enterococcus faecalis. Seen by infectious disease with recommendation for 14 days of Zosyn. Day #8 of antibiotics.  Polymicrobial bacteremia 2D echo was obtained in the setting of Enterococcus bacteremia, did not show any evidence of vegetation. Infectious disease following and made recommendations as stated above.  Acute metabolic encephalopathy in the setting of acute illness, previous CVA, and suspected underlying dementia CT head done on 10/03/2019 showed no acute intracranial abnormality. 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 Per medical record no prior history of dementia Today she is alert and oriented x1. Reorient as needed Fall precautions  AKI on CKD 4 Baseline creatinine appears to be 2.5 with GFR of 21 Creatinine is currently trending down to 3.05 with GFR of 60 Continue to avoid nephrotoxins and hypotension DC IV fluid hydration in the setting of systolic heart failure Switch to oral bicarb 1300 mg twice daily x2 days. Monitor urine output Daily BMPs  Steroid-induced hyperglycemia A1c 6.4 Started Lantus twice daily and NovoLog 3 times daily Continue to adjust insulin as needed Continue moderate insulin sliding scale.  Chronic urinary retention/complex cyst right kidney Complex cyst in the upper pole of the right kidney stable from previous ultrasounds. Foley catheter in place Will need to follow-up with urology  Anion gap metabolic acidosis Improving on bicarb drip, continue Chemistry bicarb 20 and anion gap of 13 Repeat BMP in the morning  History of previous CVA, seen on CT head Resume aspirin and pravastatin. Obtain add on lipid panel Hemoglobin A1c 6.4 on 10/08/2019.  Sinus tachycardia on last  EKG Monitor on telemetry  Newly diagnosed systolic CHF 2D echo done to rule out endocarditis revealed LVEF 35 to 40% with severe left ventricular hypokinesis Does not appear to follow with cardiology outpatient We will consult cardiology Net I&O +12.5 L DC IV fluid Continue to monitor volume status Continue strict I's and O's and daily weight Blood pressure is currently soft We will defer to cardiology starting beta-blocker   Prolonged QTC Avoid prolonging QTC agents Repeat twelve-lead EKG.  Chronic anxiety/depression Resume home medication   Adrenal insufficiency Continue Florinef  Acute upper GI bleeding: Suspected upper GI bleeding with multiple episodes of maroon-colored stool.   Positive FOBT on 10/06/2019. She was also on steroids. Hemoglobin dropped from 9.6-8-7.5. Continue p.o. Protonix 40 mg twice daily x6 weeks then daily per GI. Follow results for H. Pylori.  Acute blood loss anemia secondary to suspected upper GI bleed Management as stated above.  Physical debility CT assessment recommended SNF. TOC consulted to assist with SNF placement. Continue PT OT with assistance and fall precautions   DVT prophylaxis: SCDs Code Status: Full code   Family Communication:  Will update family.   Disposition Plan: patient is from skilled nursing facility. Anticipated DC to skilled nursing facility, Barriers to discharge active treatment for sepsis.       Objective: Vitals:   10/08/19 0800 10/08/19 0900 10/08/19 1000 10/08/19 1100  BP: 105/68 (!) 104/59 105/77 106/67  Pulse: 98 90 90 (!) 103  Resp: 20 (!) 9 (!) 9 20  Temp: (!) 97.5 F (36.4 C) (!) 97.5 F (36.4 C) (!) 97.5 F (36.4 C) 97.7 F (36.5 C)  TempSrc: Bladder     SpO2: 96% 98% 99% 98%  Weight:      Height:        Intake/Output Summary (Last 24 hours) at 10/08/2019 1241 Last data filed at 10/08/2019 1100 Gross per 24 hour  Intake 2459.26 ml  Output 205 ml  Net 2254.26 ml   Filed Weights    10/01/19 1234 10/01/19 1805  Weight: 72.2 kg 67.7 kg    Exam:  . General: 79 y.o. year-old female well developed well nourished in no acute distress.  Alert and oriented x1. Pleasantly confused. . Cardiovascular: Tachycardic with no rubs or gallops.   Marland Kitchen Respiratory: Clear to auscultation with no wheezes or rales. Good inspiratory effort. . Abdomen: Soft nontender nondistended with normal bowel sounds x4 quadrants. . Musculoskeletal: No lower extremity edema bilaterally. Marland Kitchen Psychiatry: Mood is appropriate for condition and setting   Data Reviewed: CBC: Recent Labs  Lab 10/05/19 0210 10/05/19 0210 10/06/19 0238 10/06/19 1000 10/06/19 2155 10/07/19 0412 10/07/19 1225 10/07/19 1707 10/08/19 0532  WBC 20.7*   < > 18.9*  --   --  19.0* 20.0* 21.0* 20.7*  NEUTROABS 16.6*  --  14.4*  --   --   --  16.3* 16.9* 16.4*  HGB 10.6*   < > 8.6*   < > 7.5* 7.5* 7.5* 7.2* 7.5*  HCT 35.4*   < > 29.0*   < > 25.0* 24.9* 25.2* 24.0* 25.0*  MCV 91.9   < > 94.2  --   --  94.0 94.0 93.0 93.3  PLT 151   < > 152  --   --  148* 163 167 192   < > = values in this interval not displayed.   Basic Metabolic Panel: Recent Labs  Lab 10/02/19 0545 10/02/19 0545 10/03/19 0049 10/03/19 0049 10/04/19 [id number] 10/04/19 [id number]  10/05/19 0210 10/06/19 0238 10/07/19 0412 10/07/19 2102 10/08/19 0532  NA 140   < > 142   < > 144  --  146* 146* 143  --  135  K 4.9   < > 4.8   < > 4.3  --  4.9 4.7 4.2  --  3.9  CL 114*   < > 115*   < > 115*  --  115* 117* 115*  --  102  CO2 19*   < > 18*   < > 19*  --  16* 20* 18*  --  20*  GLUCOSE 158*   < > 139*   < > 115*  --  227* 256* 229*  --  378*  BUN 55*   < > 60*   < > 57*   < > 66* 82* 87* 84* 80*  CREATININE 2.51*   < > 2.56*   < > 2.27*  --  2.80* 2.99* 3.10*  --  3.05*  CALCIUM 7.8*   < > 8.3*   < > 8.2*  --  8.3* 8.1* 7.9*  --  7.5*  MG 1.8  --  1.9  --   --   --   --  1.9 2.0  --  1.8  PHOS  --   --  3.0  --   --   --   --  3.3 3.4  --  3.3   < > = values in  this interval not displayed.   GFR: Estimated Creatinine Clearance: 14.5 mL/min (A) (by C-G formula based on SCr of 3.05 mg/dL (H)). Liver Function Tests: Recent Labs  Lab 10/02/19 0545  AST 30  ALT 10  ALKPHOS 58  BILITOT 0.7  PROT 5.3*  ALBUMIN 2.3*   No results for input(s): LIPASE, AMYLASE in the last 168 hours. No results for input(s): AMMONIA in the last 168 hours. Coagulation Profile: Recent Labs  Lab 10/01/19 1840 10/07/19 0412  INR 2.0* 1.5*   Cardiac Enzymes: No results for input(s): CKTOTAL, CKMB, CKMBINDEX, TROPONINI in the last 168 hours. BNP (last 3 results) No results for input(s): PROBNP in the last 8760 hours. HbA1C: Recent Labs    10/08/19 0532  HGBA1C 6.4*   CBG: Recent Labs  Lab 10/07/19 1130 10/07/19 1637 10/07/19 2019 10/07/19 2225 10/08/19 0748  GLUCAP 250* 292* 410* 365* 443*   Lipid Profile: No results for input(s): CHOL, HDL, LDLCALC, TRIG, CHOLHDL, LDLDIRECT in the last 72 hours. Thyroid Function Tests: No results for input(s): TSH, T4TOTAL, FREET4, T3FREE, THYROIDAB in the last 72 hours. Anemia Panel: No results for input(s): VITAMINB12, FOLATE, FERRITIN, TIBC, IRON, RETICCTPCT in the last 72 hours. Urine analysis:    Component Value Date/Time   COLORURINE YELLOW 10/01/2019 1427   APPEARANCEUR TURBID (A) 10/01/2019 1427   LABSPEC 1.014 10/01/2019 1427   PHURINE 8.0 10/01/2019 1427   GLUCOSEU NEGATIVE 10/01/2019 1427   HGBUR SMALL (A) 10/01/2019 1427   BILIRUBINUR NEGATIVE 10/01/2019 1427   KETONESUR NEGATIVE 10/01/2019 1427   PROTEINUR >=300 (A) 10/01/2019 1427   UROBILINOGEN 0.2 03/03/2010 0808   NITRITE NEGATIVE 10/01/2019 1427   LEUKOCYTESUR LARGE (A) 10/01/2019 1427   Sepsis Labs: @LABRCNTIP (procalcitonin:4,lacticidven:4)  ) Recent Results (from the past 240 hour(s))  Blood Culture (routine x 2)     Status: Abnormal   Collection Time: 10/01/19 12:12 PM   Specimen: BLOOD  Result Value Ref Range Status   Specimen  Description   Final    BLOOD RIGHT WRIST  Performed at [facility], [place] [address], [place], [place] [address]    Special Requests   Final    BOTTLES DRAWN AEROBIC AND ANAEROBIC Blood Culture results may not be optimal due to an inadequate volume of blood received in culture bottles Performed at [facility] [address], [place], [place] [address]    Culture  Setup Time   Final    GRAM POSITIVE COCCI GRAM NEGATIVE RODS CRITICAL VALUE NOTED.  VALUE IS CONSISTENT WITH PREVIOUSLY REPORTED AND CALLED VALUE.    Culture (A)  Final    PROTEUS MIRABILIS PROVIDENCIA STUARTII ENTEROCOCCUS FAECALIS SUSCEPTIBILITIES PERFORMED ON PREVIOUS CULTURE WITHIN THE LAST 5 DAYS. Performed at [facility], [place] [address], [place], [place] [address]    Report Status 10/08/2019 FINAL  Final   Organism ID, Bacteria ENTEROCOCCUS FAECALIS  Final      Susceptibility   Enterococcus faecalis - MIC*    AMPICILLIN <=2 SENSITIVE Sensitive     VANCOMYCIN 2 SENSITIVE Sensitive     GENTAMICIN SYNERGY SENSITIVE Sensitive     * ENTEROCOCCUS FAECALIS  Blood Culture (routine x 2)     Status: Abnormal   Collection Time: 10/01/19 12:13 PM   Specimen: BLOOD LEFT FOREARM  Result Value Ref Range Status   Specimen Description   Final    BLOOD LEFT FOREARM Performed at [facility] [address] [place], [place] [address]    Special Requests   Final    BOTTLES DRAWN AEROBIC AND ANAEROBIC Blood Culture results may not be optimal due to an inadequate volume of blood received in culture bottles Performed at [facility] [address] [place], [place] [address]    Culture  Setup Time   Final    GRAM POSITIVE COCCI GRAM NEGATIVE RODS IN BOTH AEROBIC AND ANAEROBIC BOTTLES CRITICAL RESULT CALLED TO, READ BACK BY AND VERIFIED WITH: PHARMD A. PHAM [id number] FCP    Culture (A)  Final    PROTEUS MIRABILIS PROVIDENCIA  STUARTII ENTEROCOCCUS FAECALIS SUSCEPTIBILITIES PERFORMED ON PREVIOUS CULTURE WITHIN THE LAST 5 DAYS. Performed at [facility], [place] [address] [place], [place] [address]    Report Status 10/08/2019 FINAL  Final   Organism ID, Bacteria PROTEUS MIRABILIS  Final   Organism ID, Bacteria PROVIDENCIA STUARTII  Final      Susceptibility   Proteus mirabilis - MIC*    AMPICILLIN <=2 SENSITIVE Sensitive     CEFAZOLIN <=4 SENSITIVE Sensitive     CEFEPIME <=0.12 SENSITIVE Sensitive     CEFTAZIDIME <=1 SENSITIVE Sensitive     CEFTRIAXONE <=0.25 SENSITIVE Sensitive     CIPROFLOXACIN 1 SENSITIVE Sensitive     GENTAMICIN <=1 SENSITIVE Sensitive     IMIPENEM 4 SENSITIVE Sensitive     TRIMETH/SULFA <=20 SENSITIVE Sensitive     AMPICILLIN/SULBACTAM <=2 SENSITIVE Sensitive     PIP/TAZO <=4 SENSITIVE Sensitive     * PROTEUS MIRABILIS   Providencia stuartii - MIC*    AMPICILLIN >=32 RESISTANT Resistant     CEFAZOLIN >=64 RESISTANT Resistant     CEFEPIME <=0.12 SENSITIVE Sensitive     CEFTAZIDIME <=1 SENSITIVE Sensitive     CEFTRIAXONE <=0.25 SENSITIVE Sensitive     CIPROFLOXACIN >=4 RESISTANT Resistant     GENTAMICIN RESISTANT Resistant     IMIPENEM 1 SENSITIVE Sensitive     TRIMETH/SULFA >=320 RESISTANT Resistant     AMPICILLIN/SULBACTAM >=32 RESISTANT Resistant     PIP/TAZO <=4 SENSITIVE  Sensitive     * PROVIDENCIA STUARTII  Blood Culture ID Panel (Reflexed)     Status: Abnormal   Collection Time: 10/01/19 12:13 PM  Result Value Ref Range Status   Enterococcus species DETECTED (A) NOT DETECTED Final    Comment: CRITICAL RESULT CALLED TO, READ BACK BY AND VERIFIED WITH: PHARMD A. PHAM [id number] FCP    Vancomycin resistance NOT DETECTED NOT DETECTED Final   Listeria monocytogenes NOT DETECTED NOT DETECTED Final   Staphylococcus species NOT DETECTED NOT DETECTED Final   Staphylococcus aureus (BCID) NOT DETECTED NOT DETECTED Final   Streptococcus species NOT DETECTED NOT DETECTED  Final   Streptococcus agalactiae NOT DETECTED NOT DETECTED Final   Streptococcus pneumoniae NOT DETECTED NOT DETECTED Final   Streptococcus pyogenes NOT DETECTED NOT DETECTED Final   Acinetobacter baumannii NOT DETECTED NOT DETECTED Final   Enterobacteriaceae species DETECTED (A) NOT DETECTED Final    Comment: Enterobacteriaceae represent a large family of gram-negative bacteria, not a single organism. CRITICAL RESULT CALLED TO, READ BACK BY AND VERIFIED WITH: PHARMD A. PHAM [id number] FCP    Enterobacter cloacae complex NOT DETECTED NOT DETECTED Final   Escherichia coli NOT DETECTED NOT DETECTED Final   Klebsiella oxytoca NOT DETECTED NOT DETECTED Final   Klebsiella pneumoniae NOT DETECTED NOT DETECTED Final   Proteus species DETECTED (A) NOT DETECTED Final    Comment: CRITICAL RESULT CALLED TO, READ BACK BY AND VERIFIED WITH: PHARMD A. PHAM [id number] FCP    Serratia marcescens NOT DETECTED NOT DETECTED Final   Carbapenem resistance NOT DETECTED NOT DETECTED Final   Haemophilus influenzae NOT DETECTED NOT DETECTED Final   Neisseria meningitidis NOT DETECTED NOT DETECTED Final   Pseudomonas aeruginosa NOT DETECTED NOT DETECTED Final   Candida albicans NOT DETECTED NOT DETECTED Final   Candida glabrata NOT DETECTED NOT DETECTED Final   Candida krusei NOT DETECTED NOT DETECTED Final   Candida parapsilosis NOT DETECTED NOT DETECTED Final   Candida tropicalis NOT DETECTED NOT DETECTED Final    Comment: Performed at [facility], [place] [address], [place], [place] [address]  Urine culture     Status: Abnormal   Collection Time: 10/01/19  2:27 PM   Specimen: In/Out Cath Urine  Result Value Ref Range Status   Specimen Description   Final    IN/OUT CATH URINE Performed at [facility] [address], [place], [place] [address]    Special Requests   Final    NONE Performed at [facility], [place] [address], [place],  [address]     Culture (A)  Final    >=100,000 COLONIES/mL PROVIDENCIA STUARTII >=100,000 COLONIES/mL PROTEUS MIRABILIS    Report Status 10/05/2019 FINAL  Final   Organism ID, Bacteria PROVIDENCIA STUARTII (A)  Final   Organism ID, Bacteria PROTEUS MIRABILIS (A)  Final      Susceptibility   Proteus mirabilis - MIC*    AMPICILLIN <=2 SENSITIVE Sensitive     CEFAZOLIN <=4 SENSITIVE Sensitive     CEFTRIAXONE <=0.25 SENSITIVE Sensitive     CIPROFLOXACIN 1 SENSITIVE Sensitive     GENTAMICIN <=1 SENSITIVE Sensitive     IMIPENEM 8 INTERMEDIATE Intermediate     NITROFURANTOIN 256 RESISTANT Resistant     TRIMETH/SULFA <=20 SENSITIVE Sensitive     AMPICILLIN/SULBACTAM <=2 SENSITIVE Sensitive     PIP/TAZO <=4 SENSITIVE Sensitive     * >=100,000 COLONIES/mL PROTEUS MIRABILIS   Providencia stuartii - MIC*    AMPICILLIN >=  32 RESISTANT Resistant     CEFAZOLIN >=64 RESISTANT Resistant     CEFTRIAXONE <=0.25 SENSITIVE Sensitive     CIPROFLOXACIN >=4 RESISTANT Resistant     GENTAMICIN RESISTANT Resistant     IMIPENEM 2 SENSITIVE Sensitive     NITROFURANTOIN 128 RESISTANT Resistant     TRIMETH/SULFA >=320 RESISTANT Resistant     AMPICILLIN/SULBACTAM >=32 RESISTANT Resistant     PIP/TAZO <=4 SENSITIVE Sensitive     * >=100,000 COLONIES/mL PROVIDENCIA STUARTII  Respiratory Panel by RT PCR (Flu A&B, Covid) - Urine, Catheterized     Status: None   Collection Time: 10/01/19  2:27 PM   Specimen: Urine, Catheterized  Result Value Ref Range Status   SARS Coronavirus 2 by RT PCR NEGATIVE NEGATIVE Final    Comment: (NOTE) SARS-CoV-2 target nucleic acids are NOT DETECTED. The SARS-CoV-2 RNA is generally detectable in upper respiratoy specimens during the acute phase of infection. The lowest concentration of SARS-CoV-2 viral copies this assay can detect is 131 copies/mL. A negative result does not preclude SARS-Cov-2 infection and should not be used as the sole basis for treatment or other patient management  decisions. A negative result may occur with  improper specimen collection/handling, submission of specimen other than nasopharyngeal swab, presence of viral mutation(s) within the areas targeted by this assay, and inadequate number of viral copies (<131 copies/mL). A negative result must be combined with clinical observations, patient history, and epidemiological information. The expected result is Negative. Fact Sheet for Patients:  PinkCheek.be Fact Sheet for Healthcare Providers:  GravelBags.it This test is not yet ap proved or cleared by the [place] FDA and  has been authorized for detection and/or diagnosis of SARS-CoV-2 by FDA under an Emergency Use Authorization (EUA). This EUA will remain  in effect (meaning this test can be used) for the duration of the COVID-19 declaration under Section 564(b)(1) of the Act, 21 U.S.C. section 360bbb-3(b)(1), unless the authorization is terminated or revoked sooner.    Influenza A by PCR NEGATIVE NEGATIVE Final   Influenza B by PCR NEGATIVE NEGATIVE Final    Comment: (NOTE) The Xpert Xpress SARS-CoV-2/FLU/RSV assay is intended as an aid in  the diagnosis of influenza from Nasopharyngeal swab specimens and  should not be used as a sole basis for treatment. Nasal washings and  aspirates are unacceptable for Xpert Xpress SARS-CoV-2/FLU/RSV  testing. Fact Sheet for Patients: PinkCheek.be Fact Sheet for Healthcare Providers: GravelBags.it This test is not yet approved or cleared by the [place] FDA and  has been authorized for detection and/or diagnosis of SARS-CoV-2 by  FDA under an Emergency Use Authorization (EUA). This EUA will remain  in effect (meaning this test can be used) for the duration of the  Covid-19 declaration under Section 564(b)(1) of the Act, 21  U.S.C. section 360bbb-3(b)(1), unless the authorization  is  terminated or revoked. Performed at [facility], [place] [address], [place], [place] [address]   MRSA PCR Screening     Status: None   Collection Time: 10/01/19  6:28 PM   Specimen: Nasopharyngeal  Result Value Ref Range Status   MRSA by PCR NEGATIVE NEGATIVE Final    Comment:        The GeneXpert MRSA Assay (FDA approved for NASAL specimens only), is one component of a comprehensive MRSA colonization surveillance program. It is not intended to diagnose MRSA infection nor to guide or monitor treatment for MRSA infections. Performed at [facility], [place] [address] [place],  [place] [address]   Culture, blood (routine x 2)     Status: None (Preliminary result)   Collection Time: 10/03/19 12:49 AM   Specimen: BLOOD  Result Value Ref Range Status   Specimen Description   Final    BLOOD RIGHT ARM Performed at [facility] [address] [place], [place] [address]    Special Requests   Final    BOTTLES DRAWN AEROBIC ONLY Blood Culture adequate volume Performed at [facility] [address], [place], [place] [address]    Culture   Final    NO GROWTH 4 DAYS Performed at [facility], [place] [address], [place], [place] [address]    Report Status PENDING  Incomplete  Culture, blood (routine x 2)     Status: None (Preliminary result)   Collection Time: 10/03/19 12:49 AM   Specimen: BLOOD  Result Value Ref Range Status   Specimen Description   Final    BLOOD RIGHT HAND Performed at [facility] [address], [place], [place] [address]    Special Requests   Final    BOTTLES DRAWN AEROBIC ONLY Blood Culture adequate volume Performed at [facility] [address] [place], [place] [address]    Culture   Final    NO GROWTH 4 DAYS Performed at [facility], [place] [address], [place],  [address]    Report Status PENDING  Incomplete       Studies: No results found.  Scheduled Meds: . Chlorhexidine Gluconate Cloth  6 each Topical Daily  . DULoxetine  20 mg Oral Daily  . fludrocortisone  0.1 mg Oral Daily  . insulin aspart  0-15 Units Subcutaneous TID WC  . insulin aspart  7 Units Subcutaneous TID WC  . insulin glargine  15 Units Subcutaneous BID  . mouth rinse  15 mL Mouth Rinse BID  . pantoprazole  40 mg Oral BID AC    Continuous Infusions: . piperacillin-tazobactam (ZOSYN)  IV Stopped (10/08/19 0500)  .  sodium bicarbonate (isotonic) infusion in sterile water 100 mL/hr at 10/08/19 1158     LOS: 7 days     Kayleen Memos, MD [facility] Pager [phone]  If 7PM-7AM, please contact night-coverage [email or web address] Password [facility] 10/08/2019, 12:41 PM

## 2019-10-08 NOTE — Progress Notes (Signed)
No reported blood in stool. Hgb stable. Awaiting H. Pylori serologies.  No further GI work-up planned as inpatient.  Suggest:  1.  Follow up H. Pylori and treat (likely in a few weeks after she's over this acute hospitalization) if positive. 2.  Pantoprazole 40 mg po bid qac x 6 weeks, then 40 mg po qd qac thereafter indefinitely. 3.  Avoid/minimize NSAIDs and anticoagulants as clinically feasible. 4.  Advance diet as approved by Speech Therapy. 5.  [facility] will sign-off; please call with questions.  We will arrange outpatient follow-up with [place] in 4-6 weeks.

## 2019-10-08 NOTE — Progress Notes (Signed)
Updated her son [name] via phone.  No prior hx of CVA per her son.  Abn CT head suggestive of chronic CVA.  MRI brain ordered to further assess.

## 2019-10-08 NOTE — Progress Notes (Signed)
Upon assessment of patient she presents this morning to be confused and delirious.  She is AO x1 self.  She continues to make statements about seeming her son in the room with her. Very calm and mild mannered.  Her vitals are WDL.  Her breath sounds are good.  She has an indwelling foley cath d/t chronic retention; last change din the ED on the 24th.  Her blood glucose this morning was 443.  Contacted Dr. [place] who came to beside to observe and assess patient.  Physician ordered new sliding scale protocol to include lantus for longer coverage control.  Patient is resting calm in bed.  Her heart sounds S1S2.  Lung sounds diminished but clear.  No advantageous sounds.  Bowel sounds are active.

## 2019-10-09 ENCOUNTER — Encounter: Payer: Self-pay | Admitting: *Deleted

## 2019-10-09 ENCOUNTER — Inpatient Hospital Stay (HOSPITAL_COMMUNITY): Payer: Medicare Other

## 2019-10-09 DIAGNOSIS — A419 Sepsis, unspecified organism: Principal | ICD-10-CM

## 2019-10-09 DIAGNOSIS — N390 Urinary tract infection, site not specified: Secondary | ICD-10-CM

## 2019-10-09 DIAGNOSIS — R6520 Severe sepsis without septic shock: Secondary | ICD-10-CM

## 2019-10-09 DIAGNOSIS — I519 Heart disease, unspecified: Secondary | ICD-10-CM

## 2019-10-09 DIAGNOSIS — B952 Enterococcus as the cause of diseases classified elsewhere: Secondary | ICD-10-CM | POA: Diagnosis present

## 2019-10-09 DIAGNOSIS — I739 Peripheral vascular disease, unspecified: Secondary | ICD-10-CM

## 2019-10-09 DIAGNOSIS — N1832 Chronic kidney disease, stage 3b: Secondary | ICD-10-CM

## 2019-10-09 DIAGNOSIS — Z95828 Presence of other vascular implants and grafts: Secondary | ICD-10-CM

## 2019-10-09 DIAGNOSIS — R7881 Bacteremia: Secondary | ICD-10-CM | POA: Diagnosis present

## 2019-10-09 DIAGNOSIS — D72829 Elevated white blood cell count, unspecified: Secondary | ICD-10-CM

## 2019-10-09 LAB — CBC WITH DIFFERENTIAL/PLATELET
Abs Immature Granulocytes: 1.47 10*3/uL — ABNORMAL HIGH (ref 0.00–0.07)
Abs Immature Granulocytes: 1.29 10*3/uL — ABNORMAL HIGH (ref 0.00–0.07)
Basophils Absolute: 0.2 10*3/uL — ABNORMAL HIGH (ref 0.0–0.1)
Basophils Absolute: 0.2 10*3/uL — ABNORMAL HIGH (ref 0.0–0.1)
Basophils Relative: 1 %
Basophils Relative: 1 %
Eosinophils Absolute: 0.1 10*3/uL (ref 0.0–0.5)
Eosinophils Absolute: 0.1 10*3/uL (ref 0.0–0.5)
Eosinophils Relative: 0 %
Eosinophils Relative: 0 %
HCT: 25.9 % — ABNORMAL LOW (ref 36.0–46.0)
HCT: 27.1 % — ABNORMAL LOW (ref 36.0–46.0)
Hemoglobin: 8 g/dL — ABNORMAL LOW (ref 12.0–15.0)
Hemoglobin: 8.5 g/dL — ABNORMAL LOW (ref 12.0–15.0)
Immature Granulocytes: 5 %
Immature Granulocytes: 4 %
Lymphocytes Relative: 16 %
Lymphocytes Relative: 10 %
Lymphs Abs: 4.8 10*3/uL — ABNORMAL HIGH (ref 0.7–4.0)
Lymphs Abs: 3.1 10*3/uL (ref 0.7–4.0)
MCH: 28.1 pg (ref 26.0–34.0)
MCH: 28.4 pg (ref 26.0–34.0)
MCHC: 30.9 g/dL (ref 30.0–36.0)
MCHC: 31.4 g/dL (ref 30.0–36.0)
MCV: 90.9 fL (ref 80.0–100.0)
MCV: 90.6 fL (ref 80.0–100.0)
Monocytes Absolute: 1.5 10*3/uL — ABNORMAL HIGH (ref 0.1–1.0)
Monocytes Absolute: 1.2 10*3/uL — ABNORMAL HIGH (ref 0.1–1.0)
Monocytes Relative: 5 %
Monocytes Relative: 4 %
Neutro Abs: 22 10*3/uL — ABNORMAL HIGH (ref 1.7–7.7)
Neutro Abs: 24.8 10*3/uL — ABNORMAL HIGH (ref 1.7–7.7)
Neutrophils Relative %: 73 %
Neutrophils Relative %: 81 %
Platelets: 213 10*3/uL (ref 150–400)
Platelets: 215 10*3/uL (ref 150–400)
RBC: 2.85 MIL/uL — ABNORMAL LOW (ref 3.87–5.11)
RBC: 2.99 MIL/uL — ABNORMAL LOW (ref 3.87–5.11)
RDW: 15.7 % — ABNORMAL HIGH (ref 11.5–15.5)
RDW: 15.9 % — ABNORMAL HIGH (ref 11.5–15.5)
WBC: 29.9 10*3/uL — ABNORMAL HIGH (ref 4.0–10.5)
WBC: 30.5 10*3/uL — ABNORMAL HIGH (ref 4.0–10.5)
nRBC: 1.3 % — ABNORMAL HIGH (ref 0.0–0.2)
nRBC: 0.6 % — ABNORMAL HIGH (ref 0.0–0.2)

## 2019-10-09 LAB — GLUCOSE, CAPILLARY
Glucose-Capillary: 51 mg/dL — ABNORMAL LOW (ref 70–99)
Glucose-Capillary: 74 mg/dL (ref 70–99)
Glucose-Capillary: 101 mg/dL — ABNORMAL HIGH (ref 70–99)
Glucose-Capillary: 92 mg/dL (ref 70–99)
Glucose-Capillary: 105 mg/dL — ABNORMAL HIGH (ref 70–99)

## 2019-10-09 LAB — BASIC METABOLIC PANEL
Anion gap: 10 (ref 5–15)
BUN: 78 mg/dL — ABNORMAL HIGH (ref 8–23)
CO2: 26 mmol/L (ref 22–32)
Calcium: 7.4 mg/dL — ABNORMAL LOW (ref 8.9–10.3)
Chloride: 102 mmol/L (ref 98–111)
Creatinine, Ser: 3.16 mg/dL — ABNORMAL HIGH (ref 0.44–1.00)
GFR calc Af Amer: 16 mL/min — ABNORMAL LOW (ref >60)
GFR calc non Af Amer: 14 mL/min — ABNORMAL LOW (ref >60)
Glucose, Bld: 48 mg/dL — ABNORMAL LOW (ref 70–99)
Potassium: 3.1 mmol/L — ABNORMAL LOW (ref 3.5–5.1)
Sodium: 138 mmol/L (ref 135–145)

## 2019-10-09 LAB — URINALYSIS, ROUTINE W REFLEX MICROSCOPIC
Bilirubin Urine: NEGATIVE
Glucose, UA: NEGATIVE mg/dL
Ketones, ur: NEGATIVE mg/dL
Nitrite: NEGATIVE
Protein, ur: NEGATIVE mg/dL
RBC / HPF: >50 RBC/hpf — ABNORMAL HIGH (ref 0–5)
Specific Gravity, Urine: 1.008 (ref 1.005–1.030)
WBC, UA: >50 WBC/hpf — ABNORMAL HIGH (ref 0–5)
pH: 5 (ref 5.0–8.0)

## 2019-10-09 LAB — H. PYLORI ANTIBODY, IGG: H Pylori IgG: 1.23 Index Value — ABNORMAL HIGH (ref 0.00–0.79)

## 2019-10-09 LAB — POTASSIUM: Potassium: 3.9 mmol/L (ref 3.5–5.1)

## 2019-10-09 MED ORDER — POTASSIUM CHLORIDE CRYS ER 20 MEQ PO TBCR
40.0000 meq | EXTENDED_RELEASE_TABLET | Freq: Two times a day (BID) | ORAL | Status: DC
  Administered 2019-10-09 (×2): 40 meq via ORAL
  Filled 2019-10-09 (×2): qty 2

## 2019-10-09 MED ORDER — INSULIN ASPART 100 UNIT/ML ~~LOC~~ SOLN: 5.0000 [IU] | Freq: Three times a day (TID) | SUBCUTANEOUS | Status: DC

## 2019-10-09 MED ORDER — INSULIN ASPART 100 UNIT/ML ~~LOC~~ SOLN
0.0000 [IU] | Freq: Three times a day (TID) | SUBCUTANEOUS | Status: DC
  Administered 2019-10-10 (×2): 3 [IU] via SUBCUTANEOUS
  Administered 2019-10-10 – 2019-10-11 (×2): 2 [IU] via SUBCUTANEOUS
  Administered 2019-10-11 – 2019-10-12 (×2): 1 [IU] via SUBCUTANEOUS
  Administered 2019-10-13 (×2): 2 [IU] via SUBCUTANEOUS
  Administered 2019-10-14 (×2): 1 [IU] via SUBCUTANEOUS

## 2019-10-09 MED ORDER — INSULIN GLARGINE 100 UNIT/ML ~~LOC~~ SOLN
4.0000 [IU] | Freq: Every day | SUBCUTANEOUS | Status: DC
  Administered 2019-10-10 – 2019-10-14 (×4): 4 [IU] via SUBCUTANEOUS
  Filled 2019-10-09 (×5): qty 0.04

## 2019-10-09 MED ORDER — MIDODRINE HCL 2.5 MG PO TABS
2.5000 mg | ORAL_TABLET | Freq: Three times a day (TID) | ORAL | Status: DC
  Administered 2019-10-09 – 2019-10-14 (×16): 2.5 mg via ORAL
  Filled 2019-10-09 (×19): qty 1

## 2019-10-09 MED ORDER — INSULIN ASPART 100 UNIT/ML ~~LOC~~ SOLN
0.0000 [IU] | Freq: Every day | SUBCUTANEOUS | Status: DC
  Administered 2019-10-10: 3 [IU] via SUBCUTANEOUS

## 2019-10-09 MED ORDER — PRAVASTATIN SODIUM 40 MG PO TABS
40.0000 mg | ORAL_TABLET | Freq: Every day | ORAL | Status: DC
  Administered 2019-10-09 – 2019-10-13 (×5): 40 mg via ORAL
  Filled 2019-10-09 (×4): qty 1
  Filled 2019-10-09: qty 2

## 2019-10-09 MED ORDER — DEXTROSE 50 % IV SOLN
50.0000 mL | Freq: Once | INTRAVENOUS | Status: AC
  Administered 2019-10-09: 50 mL via INTRAVENOUS
  Filled 2019-10-09: qty 50

## 2019-10-09 MED ORDER — POTASSIUM CHLORIDE CRYS ER 20 MEQ PO TBCR
40.0000 meq | EXTENDED_RELEASE_TABLET | Freq: Once | ORAL | Status: AC
  Administered 2019-10-09: 40 meq via ORAL
  Filled 2019-10-09: qty 2

## 2019-10-09 MED ORDER — INSULIN GLARGINE 100 UNIT/ML ~~LOC~~ SOLN
7.0000 [IU] | Freq: Two times a day (BID) | SUBCUTANEOUS | Status: DC
  Filled 2019-10-09: qty 0.07

## 2019-10-09 MED ORDER — INSULIN ASPART 100 UNIT/ML ~~LOC~~ SOLN
3.0000 [IU] | Freq: Three times a day (TID) | SUBCUTANEOUS | Status: DC
  Administered 2019-10-11 (×2): 3 [IU] via SUBCUTANEOUS

## 2019-10-09 MED ORDER — FUROSEMIDE 10 MG/ML IJ SOLN
40.0000 mg | Freq: Once | INTRAMUSCULAR | Status: AC
  Administered 2019-10-09: 16:00:00 40 mg via INTRAVENOUS
  Filled 2019-10-09: qty 4

## 2019-10-09 NOTE — Progress Notes (Signed)
  Speech Language Pathology Treatment: Dysphagia  Patient Details Name: Stephanie Olson MRN: [id number] DOB: 19-Apr-1941 Today's Date: 10/09/2019 Time: 9485-4627 SLP Time Calculation (min) (ACUTE ONLY): 12 min  Assessment / Plan / Recommendation Clinical Impression  Reports are that pt is a little more lethargic this am.  RN administering crushed meds.  Pt is alert, able to hold the cup and drink liquids independently with no s/s of aspiration.  She continues to present with intermittent oral holding - this behavior is consistent with her baseline - and she benefits from thin liquid wash to clear out her oral cavity.  Recommend continuing current diet at D/C with the understanding that she will continue to pocket foods.  While admitted, use oral suctioning at the end of each meal to ensure her mouth is free of food debris. No further acute SLP needs are identified as pt is likely near baseline.  SLP to sign off.   HPI HPI: 79 yo female adm to [facility] from [place] with AMS-  metabolic encephalopathy due to pyelonephritis complicated by malfunctioning catheter - foley catheter was replaced.  Pt has not been appropriate to accept po intake and SlP was consulted.  Stephanie Guiles RN reports removing dentures for care was difficult due to pt's mentation.  Pt CT chest showed right more than left consolidations concerning for pna, ? COVID 19 per results.  Pt also with h/o DM - diet controlled, aspiration into airway in 04/2018, smoker, FTT.  Pt has h/o dysphagia with oral holding, delay in swallow with SLP up to 45 seconds with diet recommendations of liquids only during 04/2018 admit.  Today pt seen to determine readiness for diet. RN reports pt aspirated this am when taking her po medications with water. Now is congested and appears with increased facial asymmetry per RN.      SLP Plan  All goals met       Recommendations  Diet recommendations: Dysphagia 3 (mechanical soft);Thin liquid Liquids provided via:  Cup;Straw Medication Administration: Crushed with puree Supervision: Patient able to self feed;Full supervision/cueing for compensatory strategies Compensations: Slow rate;Small sips/bites;Follow solids with liquid Postural Changes and/or Swallow Maneuvers: Seated upright 90 degrees;Upright 30-60 min after meal                Oral Care Recommendations: Oral care BID Follow up Recommendations: Skilled Nursing facility SLP Visit Diagnosis: Dysphagia, oropharyngeal phase (R13.12) Plan: All goals met       GO                Stephanie Olson 10/09/2019, 9:29 AM  Stephanie Olson, [place] Office number [phone]

## 2019-10-09 NOTE — Progress Notes (Signed)
RN to MRI to remove temp foley per MRI tech.

## 2019-10-09 NOTE — Progress Notes (Addendum)
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  Reportedly she is alert and oriented x 4 at base line and was willing to transfer back to assisted living from nursing home in near future.  In the emergency room, temperature 100.1.  Heart rate 114, respiratory 24, blood pressure 86/61.  On room air.  WBC count 14,000, procalcitonin 46, lactic acid 4.  Chest x-ray patchy bibasilar opacities, left greater than right.  CT abdomen pelvis with bilateral hydronephrosis with pyelonephritis and bladder distention.  Resuscitated, started on sepsis protocol and admitted to stepdown unit. 1/29: Mental status improving.  Started having maroon-colored stool with drop in hemoglobin, had EGD done showed non bleeding duodenal ulcer.  1/30: Mental status both are improving.  Renal functions fluctuate.  Foley functioning well and adequate urine output.  10/09/19: Seen and examined.  Somnolent but easily arousable to voice.  Alert to self. Confused.  Labile blood sugars and hypoglycemic this AM.  D50 ordered to be administered.  Repeat CBG 74.  Seen by cardiology.    Assessment/Plan: Principal Problem:   Severe sepsis (HCC) Active Problems:   Renal failure (ARF), acute on chronic (HCC)   Pressure injury of skin   Malnutrition of moderate degree   Lactic acidosis   Acute pyelonephritis   Hypotension   Hypokalemia   Chronic indwelling Foley catheter   Sinus tachycardia  Severe sepsis secondary to polymicrobial bacteremia likely secondary to a urinary source Presented with leukocytosis, tachycardia, tachypnea with positive urine analysis,  urine culture, and blood cultures with significantly elevated procalcitonin 68 which is trending down to 6. Blood cultures drawn on 10/01/2019 + for Proteus mirabilis, Providencia stuartii, Enterococcus faecalis.  Repeat blood cultures done on 10/03/2019 negative final. Seen by infectious disease with recommendation for 14 days of Zosyn. Day #9 of antibiotics. Afebrile however leukocytosis is worsening. Hypotensive, midodrine 2.5 mg 3 times daily to maintain MAP greater than 65 Infectious disease following  Polymicrobial bacteremia 2D echo was obtained in the setting of Enterococcus bacteremia, did not show any evidence of vegetation. Infectious disease following and made recommendations as stated above.  Hypotension in the setting of sepsis Hypotensive this morning Maintain MAP greater than 65 Add midodrine 2.5 mg 3 times daily  Acute metabolic encephalopathy in the setting of acute illness, previous CVA, and suspected underlying dementia CT head done on 10/03/2019 showed no acute intracranial abnormality. 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 Per medical record no prior history of dementia or CVA Will obtain MRI brain. Continue to reorient as needed.  Continue fall precautions.  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 Continue to avoid nephrotoxins and hypotension Monitor urine output; 905 cc recorded in the last 24H. Daily BMPs Consult nephrology.  Hypokalemia, likely contributed by bicarb Replete as indicated  Steroid-induced hyperglycemia with labile blood sugars/hypoglycemia A1c 6.4 on 10/08/2019 Hold off insulin coverage due to hypoglycemia with serum blood glucose 48 on 10/09/2019.  Hypoglycemia corrected with  D50  x 1  Chronic urinary retention/complex cyst right kidney Complex cyst in the upper pole of the right kidney stable from previous ultrasounds. Foley catheter in place Will need to follow-up with urology  Resolved anion gap metabolic acidosis Improved on bicarb replacement. Chemistry bicarb 26 and anion gap of 10.  History of previous CVA, seen on CT head Resume aspirin and pravastatin. LDL 95, goal less than 70 on 10/08/2019. Hemoglobin A1c 6.4 on 10/08/2019. Sinus tachycardia on last EKG Monitor on telemetry  Newly diagnosed systolic CHF 2D echo done to rule out endocarditis revealed LVEF 35 to 40% with severe left ventricular hypokinesis Does not appear to follow with cardiology outpatient We will consult cardiology Net I&O +13.5 L Continue to monitor volume status Continue strict I's and O's and daily weight Heart failure medications on hold due to hypotension.  Prolonged QTC Avoid prolonging QTC agents Repeat twelve-lead EKG.  Chronic anxiety/depression Continue home medication   Adrenal insufficiency Continue Florinef  Acute upper GI bleeding: Suspected upper GI bleeding with multiple episodes of maroon-colored stool.   Positive FOBT on 10/06/2019. She was also on steroids. Hemoglobin dropped from 9.6-8-7.5. Continue p.o. Protonix 40 mg twice daily x6 weeks then daily per GI. Follow results for H. Pylori.  Acute blood loss anemia secondary to suspected upper GI bleed Management as stated above.  Physical debility PT assessment recommended SNF. TOC consulted to assist with SNF placement. Continue PT OT with assistance and fall precautions   DVT prophylaxis: SCDs Code Status: Full code  Consults: Cardiology, nephrology on 10/09/2019.  Family Communication:  Updated her son [name]   Disposition Plan: patient is from skilled nursing facility. Anticipated DC to skilled nursing facility, Barriers to discharge active treatment for severe  sepsis.       Objective: Vitals:   10/09/19 0300 10/09/19 0421 10/09/19 0500 10/09/19 0600  BP: 96/64 (!) 100/53 (!) 90/44 (!) 94/47  Pulse: 91 96 93 90  Resp: (!) 8 20 (!) 9 (!) 8  Temp: (!) 97.2 F (36.2 C) (!) 97.2 F (36.2 C) (!) 97.2 F (36.2 C) (!) 97.2 F (36.2 C)  TempSrc:      SpO2: 100% 98% 99% 98%  Weight:    82 kg  Height:        Intake/Output Summary (Last 24 hours) at 10/09/2019 0933 Last data filed at 10/09/2019 0650 Gross per 24 hour  Intake 2018.43 ml  Output 905 ml  Net 1113.43 ml   Filed Weights   10/01/19 1234 10/01/19 1805 10/09/19 0600  Weight: 72.2 kg 67.7 kg 82 kg    Exam:  . General: 79 y.o. year-old female well-developed well-nourished in no acute stress.  Somnolent but easily arousable to voices.  Confused. . Cardiovascular: Regular rate and rhythm no rubs or gallops. Marland Kitchen Respiratory: Clear to auscultation with no wheezes or rales.  Good inspiratory effort. . Abdomen: Soft nontender normal bowel sounds present.   . Musculoskeletal: No lower extremity edema bilaterally.   Psychiatry: Mood is appropriate for condition and setting.  Data Reviewed: CBC: Recent Labs  Lab 10/07/19 1225 10/07/19 1707 10/08/19 0532 10/08/19 1657 10/09/19 0517  WBC 20.0* 21.0* 20.7* 24.5* 29.9*  NEUTROABS 16.3* 16.9* 16.4* 18.3* 22.0*  HGB 7.5* 7.2* 7.5* 7.7* 8.0*  HCT 25.2* 24.0* 25.0* 24.9* 25.9*  MCV 94.0 93.0 93.3 92.2 90.9  PLT 163 167 192 194 [id number]   Basic Metabolic Panel: Recent Labs  Lab 10/03/19 0049 10/04/19 0204 10/05/19 0210 10/05/19 0210 10/06/19 0238 10/07/19 0412 10/07/19  2102 10/08/19 0532 10/09/19 0517  NA 142   < > 146*  --  146* 143  --  135 138  K 4.8   < > 4.9  --  4.7 4.2  --  3.9 3.1*  CL 115*   < > 115*  --  117* 115*  --  102 102  CO2 18*   < > 16*  --  20* 18*  --  20* 26  GLUCOSE 139*   < > 227*  --  256* 229*  --  378* 48*  BUN 60*   < > 66*   < > 82* 87* 84* 80* 78*  CREATININE 2.56*   < > 2.80*  --  2.99* 3.10*  --   3.05* 3.16*  CALCIUM 8.3*   < > 8.3*  --  8.1* 7.9*  --  7.5* 7.4*  MG 1.9  --   --   --  1.9 2.0  --  1.8  --   PHOS 3.0  --   --   --  3.3 3.4  --  3.3  --    < > = values in this interval not displayed.   GFR: Estimated Creatinine Clearance: 16.1 mL/min (A) (by C-G formula based on SCr of 3.16 mg/dL (H)). Liver Function Tests: No results for input(s): AST, ALT, ALKPHOS, BILITOT, PROT, ALBUMIN in the last 168 hours. No results for input(s): LIPASE, AMYLASE in the last 168 hours. No results for input(s): AMMONIA in the last 168 hours. Coagulation Profile: Recent Labs  Lab 10/07/19 0412  INR 1.5*   Cardiac Enzymes: No results for input(s): CKTOTAL, CKMB, CKMBINDEX, TROPONINI in the last 168 hours. BNP (last 3 results) No results for input(s): PROBNP in the last 8760 hours. HbA1C: Recent Labs    10/08/19 0532  HGBA1C 6.4*   CBG: Recent Labs  Lab 10/08/19 0748 10/08/19 1213 10/08/19 1737 10/08/19 2132 10/09/19 0901  GLUCAP 443* 351* 231* 77 51*   Lipid Profile: Recent Labs    10/08/19 0524  CHOL 166  HDL 28*  LDLCALC 95  TRIG 217*  CHOLHDL 5.9   Thyroid Function Tests: No results for input(s): TSH, T4TOTAL, FREET4, T3FREE, THYROIDAB in the last 72 hours. Anemia Panel: No results for input(s): VITAMINB12, FOLATE, FERRITIN, TIBC, IRON, RETICCTPCT in the last 72 hours. Urine analysis:    Component Value Date/Time   COLORURINE YELLOW 10/01/2019 1427   APPEARANCEUR TURBID (A) 10/01/2019 1427   LABSPEC 1.014 10/01/2019 1427   PHURINE 8.0 10/01/2019 1427   GLUCOSEU NEGATIVE 10/01/2019 1427   HGBUR SMALL (A) 10/01/2019 1427   BILIRUBINUR NEGATIVE 10/01/2019 1427   KETONESUR NEGATIVE 10/01/2019 1427   PROTEINUR >=300 (A) 10/01/2019 1427   UROBILINOGEN 0.2 03/03/2010 0808   NITRITE NEGATIVE 10/01/2019 1427   LEUKOCYTESUR LARGE (A) 10/01/2019 1427   Sepsis Labs: @LABRCNTIP (procalcitonin:4,lacticidven:4)  ) Recent Results (from the past 240 hour(s))  Blood  Culture (routine x 2)     Status: Abnormal   Collection Time: 10/01/19 12:12 PM   Specimen: BLOOD  Result Value Ref Range Status   Specimen Description   Final    BLOOD RIGHT WRIST Performed at [facility] [address], [place], [place] [address]    Special Requests   Final    BOTTLES DRAWN AEROBIC AND ANAEROBIC Blood Culture results may not be optimal due to an inadequate volume of blood received in culture bottles Performed at [facility] [address] [place], [place] [address]  Culture  Setup Time   Final    GRAM POSITIVE COCCI GRAM NEGATIVE RODS CRITICAL VALUE NOTED.  VALUE IS CONSISTENT WITH PREVIOUSLY REPORTED AND CALLED VALUE.    Culture (A)  Final    PROTEUS MIRABILIS PROVIDENCIA STUARTII ENTEROCOCCUS FAECALIS SUSCEPTIBILITIES PERFORMED ON PREVIOUS CULTURE WITHIN THE LAST 5 DAYS. Performed at [facility], [place] [address] [place], [place] [address]    Report Status 10/08/2019 FINAL  Final   Organism ID, Bacteria ENTEROCOCCUS FAECALIS  Final      Susceptibility   Enterococcus faecalis - MIC*    AMPICILLIN <=2 SENSITIVE Sensitive     VANCOMYCIN 2 SENSITIVE Sensitive     GENTAMICIN SYNERGY SENSITIVE Sensitive     * ENTEROCOCCUS FAECALIS  Blood Culture (routine x 2)     Status: Abnormal   Collection Time: 10/01/19 12:13 PM   Specimen: BLOOD LEFT FOREARM  Result Value Ref Range Status   Specimen Description   Final    BLOOD LEFT FOREARM Performed at [facility] [address], [place], [place] [address]    Special Requests   Final    BOTTLES DRAWN AEROBIC AND ANAEROBIC Blood Culture results may not be optimal due to an inadequate volume of blood received in culture bottles Performed at [facility] [address] [place], [place] [address]    Culture  Setup Time   Final    GRAM POSITIVE COCCI GRAM NEGATIVE RODS IN BOTH AEROBIC AND ANAEROBIC BOTTLES CRITICAL RESULT CALLED  TO, READ BACK BY AND VERIFIED WITH: PHARMD A. PHAM 0840 [id number] FCP    Culture (A)  Final    PROTEUS MIRABILIS PROVIDENCIA STUARTII ENTEROCOCCUS FAECALIS SUSCEPTIBILITIES PERFORMED ON PREVIOUS CULTURE WITHIN THE LAST 5 DAYS. Performed at [facility], [place] [address], [place], [place] [address]    Report Status 10/08/2019 FINAL  Final   Organism ID, Bacteria PROTEUS MIRABILIS  Final   Organism ID, Bacteria PROVIDENCIA STUARTII  Final      Susceptibility   Proteus mirabilis - MIC*    AMPICILLIN <=2 SENSITIVE Sensitive     CEFAZOLIN <=4 SENSITIVE Sensitive     CEFEPIME <=0.12 SENSITIVE Sensitive     CEFTAZIDIME <=1 SENSITIVE Sensitive     CEFTRIAXONE <=0.25 SENSITIVE Sensitive     CIPROFLOXACIN 1 SENSITIVE Sensitive     GENTAMICIN <=1 SENSITIVE Sensitive     IMIPENEM 4 SENSITIVE Sensitive     TRIMETH/SULFA <=20 SENSITIVE Sensitive     AMPICILLIN/SULBACTAM <=2 SENSITIVE Sensitive     PIP/TAZO <=4 SENSITIVE Sensitive     * PROTEUS MIRABILIS   Providencia stuartii - MIC*    AMPICILLIN >=32 RESISTANT Resistant     CEFAZOLIN >=64 RESISTANT Resistant     CEFEPIME <=0.12 SENSITIVE Sensitive     CEFTAZIDIME <=1 SENSITIVE Sensitive     CEFTRIAXONE <=0.25 SENSITIVE Sensitive     CIPROFLOXACIN >=4 RESISTANT Resistant     GENTAMICIN RESISTANT Resistant     IMIPENEM 1 SENSITIVE Sensitive     TRIMETH/SULFA >=320 RESISTANT Resistant     AMPICILLIN/SULBACTAM >=32 RESISTANT Resistant     PIP/TAZO <=4 SENSITIVE Sensitive     * PROVIDENCIA STUARTII  Blood Culture ID Panel (Reflexed)     Status: Abnormal   Collection Time: 10/01/19 12:13 PM  Result Value Ref Range Status   Enterococcus species DETECTED (A) NOT DETECTED Final    Comment: CRITICAL RESULT CALLED TO, READ BACK BY AND VERIFIED WITH: PHARMD A. PHAM 0840 [id number] FCP  Vancomycin resistance NOT DETECTED NOT DETECTED Final   Listeria monocytogenes NOT DETECTED NOT DETECTED Final   Staphylococcus species NOT DETECTED NOT DETECTED  Final   Staphylococcus aureus (BCID) NOT DETECTED NOT DETECTED Final   Streptococcus species NOT DETECTED NOT DETECTED Final   Streptococcus agalactiae NOT DETECTED NOT DETECTED Final   Streptococcus pneumoniae NOT DETECTED NOT DETECTED Final   Streptococcus pyogenes NOT DETECTED NOT DETECTED Final   Acinetobacter baumannii NOT DETECTED NOT DETECTED Final   Enterobacteriaceae species DETECTED (A) NOT DETECTED Final    Comment: Enterobacteriaceae represent a large family of gram-negative bacteria, not a single organism. CRITICAL RESULT CALLED TO, READ BACK BY AND VERIFIED WITH: PHARMD A. PHAM 0840 [id number] FCP    Enterobacter cloacae complex NOT DETECTED NOT DETECTED Final   Escherichia coli NOT DETECTED NOT DETECTED Final   Klebsiella oxytoca NOT DETECTED NOT DETECTED Final   Klebsiella pneumoniae NOT DETECTED NOT DETECTED Final   Proteus species DETECTED (A) NOT DETECTED Final    Comment: CRITICAL RESULT CALLED TO, READ BACK BY AND VERIFIED WITH: PHARMD A. PHAM 0840 [id number] FCP    Serratia marcescens NOT DETECTED NOT DETECTED Final   Carbapenem resistance NOT DETECTED NOT DETECTED Final   Haemophilus influenzae NOT DETECTED NOT DETECTED Final   Neisseria meningitidis NOT DETECTED NOT DETECTED Final   Pseudomonas aeruginosa NOT DETECTED NOT DETECTED Final   Candida albicans NOT DETECTED NOT DETECTED Final   Candida glabrata NOT DETECTED NOT DETECTED Final   Candida krusei NOT DETECTED NOT DETECTED Final   Candida parapsilosis NOT DETECTED NOT DETECTED Final   Candida tropicalis NOT DETECTED NOT DETECTED Final    Comment: Performed at [facility], [place] [address] [place], [place] [address]  Urine culture     Status: Abnormal   Collection Time: 10/01/19  2:27 PM   Specimen: In/Out Cath Urine  Result Value Ref Range Status   Specimen Description   Final    IN/OUT CATH URINE Performed at [facility] [address] [place], [place] [address]    Special  Requests   Final    NONE Performed at [facility], [place] [address], [place], [place] [address]    Culture (A)  Final    >=100,000 COLONIES/mL PROVIDENCIA STUARTII >=100,000 COLONIES/mL PROTEUS MIRABILIS    Report Status 10/05/2019 FINAL  Final   Organism ID, Bacteria PROVIDENCIA STUARTII (A)  Final   Organism ID, Bacteria PROTEUS MIRABILIS (A)  Final      Susceptibility   Proteus mirabilis - MIC*    AMPICILLIN <=2 SENSITIVE Sensitive     CEFAZOLIN <=4 SENSITIVE Sensitive     CEFTRIAXONE <=0.25 SENSITIVE Sensitive     CIPROFLOXACIN 1 SENSITIVE Sensitive     GENTAMICIN <=1 SENSITIVE Sensitive     IMIPENEM 8 INTERMEDIATE Intermediate     NITROFURANTOIN 256 RESISTANT Resistant     TRIMETH/SULFA <=20 SENSITIVE Sensitive     AMPICILLIN/SULBACTAM <=2 SENSITIVE Sensitive     PIP/TAZO <=4 SENSITIVE Sensitive     * >=100,000 COLONIES/mL PROTEUS MIRABILIS   Providencia stuartii - MIC*    AMPICILLIN >=32 RESISTANT Resistant     CEFAZOLIN >=64 RESISTANT Resistant     CEFTRIAXONE <=0.25 SENSITIVE Sensitive     CIPROFLOXACIN >=4 RESISTANT Resistant     GENTAMICIN RESISTANT Resistant     IMIPENEM 2 SENSITIVE Sensitive     NITROFURANTOIN 128 RESISTANT Resistant     TRIMETH/SULFA >=320 RESISTANT Resistant     AMPICILLIN/SULBACTAM >=32 RESISTANT  Resistant     PIP/TAZO <=4 SENSITIVE Sensitive     * >=100,000 COLONIES/mL PROVIDENCIA STUARTII  Respiratory Panel by RT PCR (Flu A&B, Covid) - Urine, Catheterized     Status: None   Collection Time: 10/01/19  2:27 PM   Specimen: Urine, Catheterized  Result Value Ref Range Status   SARS Coronavirus 2 by RT PCR NEGATIVE NEGATIVE Final    Comment: (NOTE) SARS-CoV-2 target nucleic acids are NOT DETECTED. The SARS-CoV-2 RNA is generally detectable in upper respiratoy specimens during the acute phase of infection. The lowest concentration of SARS-CoV-2 viral copies this assay can detect is 131 copies/mL. A negative result does not  preclude SARS-Cov-2 infection and should not be used as the sole basis for treatment or other patient management decisions. A negative result may occur with  improper specimen collection/handling, submission of specimen other than nasopharyngeal swab, presence of viral mutation(s) within the areas targeted by this assay, and inadequate number of viral copies (<131 copies/mL). A negative result must be combined with clinical observations, patient history, and epidemiological information. The expected result is Negative. Fact Sheet for Patients:  PinkCheek.be Fact Sheet for Healthcare Providers:  GravelBags.it This test is not yet ap proved or cleared by the [place] FDA and  has been authorized for detection and/or diagnosis of SARS-CoV-2 by FDA under an Emergency Use Authorization (EUA). This EUA will remain  in effect (meaning this test can be used) for the duration of the COVID-19 declaration under Section 564(b)(1) of the Act, 21 U.S.C. section 360bbb-3(b)(1), unless the authorization is terminated or revoked sooner.    Influenza A by PCR NEGATIVE NEGATIVE Final   Influenza B by PCR NEGATIVE NEGATIVE Final    Comment: (NOTE) The Xpert Xpress SARS-CoV-2/FLU/RSV assay is intended as an aid in  the diagnosis of influenza from Nasopharyngeal swab specimens and  should not be used as a sole basis for treatment. Nasal washings and  aspirates are unacceptable for Xpert Xpress SARS-CoV-2/FLU/RSV  testing. Fact Sheet for Patients: PinkCheek.be Fact Sheet for Healthcare Providers: GravelBags.it This test is not yet approved or cleared by the [place] FDA and  has been authorized for detection and/or diagnosis of SARS-CoV-2 by  FDA under an Emergency Use Authorization (EUA). This EUA will remain  in effect (meaning this test can be used) for the duration of the    Covid-19 declaration under Section 564(b)(1) of the Act, 21  U.S.C. section 360bbb-3(b)(1), unless the authorization is  terminated or revoked. Performed at [facility], [place] [address], [place], [place] [address]   MRSA PCR Screening     Status: None   Collection Time: 10/01/19  6:28 PM   Specimen: Nasopharyngeal  Result Value Ref Range Status   MRSA by PCR NEGATIVE NEGATIVE Final    Comment:        The GeneXpert MRSA Assay (FDA approved for NASAL specimens only), is one component of a comprehensive MRSA colonization surveillance program. It is not intended to diagnose MRSA infection nor to guide or monitor treatment for MRSA infections. Performed at [facility], [place] [address], [place], [place] [address]   Culture, blood (routine x 2)     Status: None   Collection Time: 10/03/19 12:49 AM   Specimen: BLOOD  Result Value Ref Range Status   Specimen Description   Final    BLOOD RIGHT ARM Performed at [facility] [address], [place], [place] [address]    Special Requests  Final    BOTTLES DRAWN AEROBIC ONLY Blood Culture adequate volume Performed at [facility] [address], [place], [place] [address]    Culture   Final    NO GROWTH 5 DAYS Performed at [facility], [place] [address], [place], [place] [address]    Report Status 10/08/2019 FINAL  Final  Culture, blood (routine x 2)     Status: None   Collection Time: 10/03/19 12:49 AM   Specimen: BLOOD  Result Value Ref Range Status   Specimen Description   Final    BLOOD RIGHT HAND Performed at [facility] [address] [place], [place] [address]    Special Requests   Final    BOTTLES DRAWN AEROBIC ONLY Blood Culture adequate volume Performed at [facility] [address] [place], [place] [address]    Culture   Final    NO GROWTH 5 DAYS Performed at [facility], [place] [address] [place],  [address]    Report Status 10/08/2019 FINAL  Final      Studies: No results found.  Scheduled Meds: . aspirin EC  81 mg Oral Daily  . Chlorhexidine Gluconate Cloth  6 each Topical Daily  . dextrose  50 mL Intravenous Once  . DULoxetine  20 mg Oral Daily  . fludrocortisone  0.1 mg Oral Daily  . furosemide  20 mg Oral Daily  . insulin aspart  0-5 Units Subcutaneous QHS  . insulin aspart  0-9 Units Subcutaneous TID WC  . insulin aspart  3 Units Subcutaneous TID WC  . [START ON 10/10/2019] insulin glargine  4 Units Subcutaneous QHS  . mouth rinse  15 mL Mouth Rinse BID  . midodrine  2.5 mg Oral TID WC  . pantoprazole  40 mg Oral BID AC  . potassium chloride  40 mEq Oral BID  . pravastatin  10 mg Oral QHS  . sodium bicarbonate  1,300 mg Oral BID    Continuous Infusions: . piperacillin-tazobactam (ZOSYN)  IV Stopped (10/09/19 0720)     LOS: 8 days     Kayleen Memos, MD [facility] Pager [phone]  If 7PM-7AM, please contact night-coverage [email or web address] Password [facility] 10/09/2019, 9:33 AM

## 2019-10-09 NOTE — Progress Notes (Signed)
Pt transported in bed to MRI, off tele.  Pt alert and oriented at baseline..in no distress  Temp foley left in place, but disconnected from monitoring

## 2019-10-09 NOTE — Plan of Care (Signed)

## 2019-10-09 NOTE — TOC Progression Note (Signed)
Transition of Care [facility]) - Progression Note    Patient Details  Name: Stephanie Olson MRN: [id number] Date of Birth: 03-Sep-1941  Transition of Care [facility]) CM/SW [place], [place] Phone Number: 10/09/2019, 11:19 AM  Clinical Narrative:    [facility] staff will continue to follow this patient for d/c needs back to [facility], when medically stable.    Expected Discharge Plan: Skilled Nursing Facility Barriers to Discharge: Continued Medical Work up  Expected Discharge Plan and Services Expected Discharge Plan: [place]   Discharge Planning Services: NA   Living arrangements for the past 2 months: [place]                   DME Agency: NA       HH Arranged: NA           Social Determinants of Health (SDOH) Interventions    Readmission Risk Interventions Readmission Risk Prevention Plan 10/04/2019  Transportation Screening Complete  Medication Review [employer]) Referral to Pharmacy  PCP or Specialist appointment within 3-5 days of discharge Complete  HRI or [place] Not Complete  HRI or Home Care Consult Pt Refusal Comments Patient is long term care at [facility] Complete  Some recent data might be hidden

## 2019-10-09 NOTE — Consult Note (Signed)
[facility]  INPATIENT CONSULTATION  Reason for Consultation: AKI on CKD Requesting Provider: Dr. Irene Pap  HPI: Stephanie [facility] is an 79 y.o. female with DM, HTN, PVD, urinary retention with chronic indwelling foley, adrenal insufficiency who is seen for evaluation and management of AKI on CKD.    She presented 10/01/19 with SIRS (BP 86/61, WBC 14k, lactate 4); imaging with bibasilar opacities, CT A/P with hydronephrosis with pyelo and bladder distention.  Resuscitated, broad spectrum abx.  Blood cultures from admission with  Proteus mirabilis, Providencia stuartii, Enterococcus faecalis and urine with the 2 gram negs; blood cultures neg on repeat; TTE neg veg.  On zosyn - today is day 9 of planned 14.  Course also complicated by maroon stool +FOBT and worsening anemia; EGD with nonbleeding duodenal ulcer. Now on BID PPI. She's been maintained on midodrine 2.5 TID for ongoing low Bps.  TTE this admission noted EF 35-50% with severe LV hypokinesis.  Cardiology has been consulted and suspect CAD likely but would medically manage for now in light of other acute issues.  Would plan outpt stress and LHC as renal function allows.   On presentation on 1/24 Cr 2.5-2.9 and has trended up a bit to 3.16 today.  UOP has ranged from [phone], yesterday 988mL. Net +13L by I/os.  Only a few wts recorded - 72.2kg or 67.7kg on presentation (both on 1/24) and today 82kg.  BPs to 80/40-50s to 100-110s /70s generally.    I saw her in clinic 09/18/19 - routine CKD f/u. Wt was stable.  BP 102/60.  She was on lokelma 3x/wk but had had recent hypokalemia so we recommended to stop lokelma.  Baseline Cr 1.8-2, 08/09/20 was 1.68.   PMH: Past Medical History:  Diagnosis Date  . Arthritis   . Diabetes mellitus without complication ([place])   . Diabetic foot ulcers (HCC)    RIGHT   . Hypertension   . Peripheral vascular disease (HCC)    PSH: Past Surgical History:  Procedure Laterality Date  . ABDOMINAL  AORTAGRAM  01/29/2015  . ATHERECTOMY Right 01/29/2015   FEMORAL ARTERY   . BALLOON ANGIOPLASTY, ARTERY Right 01/29/2015   RT FEMORAL   . FOOT SURGERY    . PERIPHERAL VASCULAR CATHETERIZATION N/A 01/29/2015   Procedure: Abdominal Aortogram w/Lower Extremity;  Surgeon: Serafina Mitchell, MD;  Location: [facility];  Service: Cardiovascular;  Laterality: N/A;     Past Medical History:  Diagnosis Date  . Arthritis   . Diabetes mellitus without complication ([place])   . Diabetic foot ulcers (HCC)    RIGHT   . Hypertension   . Peripheral vascular disease ([place])     Medications:  I have reviewed the patient's current medications.  Medications Prior to Admission  Medication Sig Dispense Refill  . acetaminophen (TYLENOL) 500 MG tablet Take 500 mg by mouth 2 (two) times daily as needed for mild pain.     . Amino Acids-Protein Hydrolys (FEEDING SUPPLEMENT, PRO-STAT SUGAR FREE 64,) LIQD Take 30 mLs by mouth 2 (two) times daily. 887 mL 0  . aspirin 81 MG chewable tablet Chew 81 mg by mouth daily.    . bisacodyl (DULCOLAX) 10 MG suppository Place 1 suppository (10 mg total) rectally daily as needed for moderate constipation. 12 suppository 0  . dicyclomine (BENTYL) 10 MG capsule Take 10 mg by mouth 2 (two) times daily.    . DULoxetine (CYMBALTA) 20 MG capsule Take 20 mg by mouth daily.    Marland Kitchen  fludrocortisone (FLORINEF) 0.1 MG tablet Take 1 tablet (0.1 mg total) by mouth daily. 30 tablet 11  . folic acid (FOLVITE) 1 MG tablet Take 1 tablet (1 mg total) by mouth daily.    Marland Kitchen HYDROcodone-acetaminophen (NORCO/VICODIN) 5-325 MG tablet Take 1 tablet by mouth every 4 (four) hours as needed for moderate pain. 5 tablet 0  . Lidocaine 4 % PTCH Apply 1 patch topically as directed.    . Melatonin 3 MG TABS Take 3 mg by mouth at bedtime.     . polyvinyl alcohol (LIQUIFILM TEARS) 1.4 % ophthalmic solution Place 1 drop into both eyes 2 (two) times daily.    . pravastatin (PRAVACHOL) 20 MG tablet Take 10 mg by  mouth at bedtime.    . senna-docusate (SENOKOT-S) 8.6-50 MG tablet Take 1 tablet by mouth 2 (two) times daily.    . tamsulosin (FLOMAX) 0.4 MG CAPS capsule Take 0.4 mg by mouth daily.    . vitamin B-12 1000 MCG tablet Take 1 tablet (1,000 mcg total) by mouth daily.    . ondansetron (ZOFRAN) 4 MG tablet Take 1 tablet (4 mg total) by mouth every 6 (six) hours as needed for nausea. (Patient not taking: Reported on 10/01/2019) 20 tablet 0  . sodium bicarbonate 650 MG tablet Take 2 tablets (1,300 mg total) by mouth 3 (three) times daily. (Patient not taking: Reported on 10/01/2019)    . sodium zirconium cyclosilicate (LOKELMA) 5 g packet Take 5 g by mouth every Monday, Wednesday, and Friday. (Patient not taking: Reported on 10/01/2019)      ALLERGIES:  No Known Allergies  FAM HX: Family History  Problem Relation Age of Onset  . Hypertension Mother   . Hypertension Father     Social History:   reports that she has been smoking cigarettes. She has never used smokeless tobacco. She reports that she does not drink alcohol or use drugs.  ROS: 12 system ROS neg except per HPI  Blood pressure (!) 93/57, pulse 99, temperature (!) 97.5 F (36.4 C), resp. rate (!) 24, height 5\' 6"  (1.676 m), weight 82 kg, SpO2 98 %. PHYSICAL EXAM: Gen: lying 45 degrees in bed in no distress  Eyes:  anicteric ENT: MMM Neck: supple CV:  RRR, II/VI SEM Abd:  Soft, nontender Lungs: clear ant and laterally GU: foley with scan clear yellow urine Extr: 1-2+ thigh edema (dependent) Neuro: hard of hearing, oriented to person, hospital and year "43" Skin: warm and dry, no rashes noted   Results for orders placed or performed during the hospital encounter of 10/01/19 (from the past 48 hour(s))  CBC with Differential/Platelet     Status: Abnormal   Collection Time: 10/07/19 12:25 PM  Result Value Ref Range   WBC 20.0 (H) 4.0 - 10.5 K/uL   RBC 2.68 (L) 3.87 - 5.11 MIL/uL   Hemoglobin 7.5 (L) 12.0 - 15.0 g/dL   HCT  25.2 (L) 36.0 - 46.0 %   MCV 94.0 80.0 - 100.0 fL   MCH 28.0 26.0 - 34.0 pg   MCHC 29.8 (L) 30.0 - 36.0 g/dL   RDW 16.3 (H) 11.5 - 15.5 %   Platelets 163 150 - 400 K/uL   nRBC 0.6 (H) 0.0 - 0.2 %   Neutrophils Relative % 81 %   Neutro Abs 16.3 (H) 1.7 - 7.7 K/uL   Lymphocytes Relative 10 %   Lymphs Abs 1.9 0.7 - 4.0 K/uL   Monocytes Relative 4 %   Monocytes Absolute 0.7 0.1 -  1.0 K/uL   Eosinophils Relative 0 %   Eosinophils Absolute 0.0 0.0 - 0.5 K/uL   Basophils Relative 0 %   Basophils Absolute 0.1 0.0 - 0.1 K/uL   Immature Granulocytes 5 %   Abs Immature Granulocytes 0.92 (H) 0.00 - 0.07 K/uL    Comment: Performed at [facility], [place] [address], [place], [place] [address]  Glucose, capillary     Status: Abnormal   Collection Time: 10/07/19  4:37 PM  Result Value Ref Range   Glucose-Capillary 292 (H) 70 - 99 mg/dL  CBC with Differential/Platelet     Status: Abnormal   Collection Time: 10/07/19  5:07 PM  Result Value Ref Range   WBC 21.0 (H) 4.0 - 10.5 K/uL   RBC 2.58 (L) 3.87 - 5.11 MIL/uL   Hemoglobin 7.2 (L) 12.0 - 15.0 g/dL   HCT 24.0 (L) 36.0 - 46.0 %   MCV 93.0 80.0 - 100.0 fL   MCH 27.9 26.0 - 34.0 pg   MCHC 30.0 30.0 - 36.0 g/dL   RDW 16.1 (H) 11.5 - 15.5 %   Platelets 167 150 - 400 K/uL   nRBC 0.7 (H) 0.0 - 0.2 %   Neutrophils Relative % 80 %   Neutro Abs 16.9 (H) 1.7 - 7.7 K/uL   Lymphocytes Relative 10 %   Lymphs Abs 2.1 0.7 - 4.0 K/uL   Monocytes Relative 4 %   Monocytes Absolute 0.7 0.1 - 1.0 K/uL   Eosinophils Relative 0 %   Eosinophils Absolute 0.0 0.0 - 0.5 K/uL   Basophils Relative 0 %   Basophils Absolute 0.1 0.0 - 0.1 K/uL   WBC Morphology MILD LEFT SHIFT (1-5% METAS, OCC MYELO, OCC BANDS)     Comment: VACUOLATED NEUTROPHILS   Immature Granulocytes 6 %   Abs Immature Granulocytes 1.23 (H) 0.00 - 0.07 K/uL   Acanthocytes PRESENT    Polychromasia PRESENT     Comment: Performed at [facility], [place]  [address], [place], [place] [address]  Glucose, capillary     Status: Abnormal   Collection Time: 10/07/19  8:19 PM  Result Value Ref Range   Glucose-Capillary 410 (H) 70 - 99 mg/dL  BUN     Status: Abnormal   Collection Time: 10/07/19  9:02 PM  Result Value Ref Range   BUN 84 (H) 8 - 23 mg/dL    Comment: Performed at [facility], [place] [address] [place], [place] [address]  Glucose, capillary     Status: Abnormal   Collection Time: 10/07/19 10:25 PM  Result Value Ref Range   Glucose-Capillary 365 (H) 70 - 99 mg/dL  Lipid panel     Status: Abnormal   Collection Time: 10/08/19  5:24 AM  Result Value Ref Range   Cholesterol 166 0 - 200 mg/dL   Triglycerides 217 (H) <150 mg/dL   HDL 28 (L) >40 mg/dL   Total CHOL/HDL Ratio 5.9 RATIO   VLDL 43 (H) 0 - 40 mg/dL   LDL Cholesterol 95 0 - 99 mg/dL    Comment:        Total Cholesterol/HDL:CHD Risk Coronary Heart Disease Risk Table                     Men   Women  1/2 Average Risk   3.4   3.3  Average Risk       5.0   4.4  2 X Average Risk   9.6  7.1  3 X Average Risk  23.4   11.0        Use the calculated Patient Ratio above and the CHD Risk Table to determine the patient's CHD Risk.        ATP III CLASSIFICATION (LDL):  <100     mg/dL   Optimal  100-129  mg/dL   Near or Above                    Optimal  130-159  mg/dL   Borderline  160-189  mg/dL   High  >190     mg/dL   Very High Performed at [place] [address], [place], [place] [id number]   Basic metabolic panel     Status: Abnormal   Collection Time: 10/08/19  5:32 AM  Result Value Ref Range   Sodium 135 135 - 145 mmol/L    Comment: DELTA CHECK NOTED   Potassium 3.9 3.5 - 5.1 mmol/L   Chloride 102 98 - 111 mmol/L   CO2 20 (L) 22 - 32 mmol/L   Glucose, Bld 378 (H) 70 - 99 mg/dL   BUN 80 (H) 8 - 23 mg/dL   Creatinine, Ser 3.05 (H) 0.44 - 1.00 mg/dL   Calcium 7.5 (L) 8.9 - 10.3 mg/dL   GFR calc non Af Amer 14 (L) >60 mL/min    GFR calc Af Amer 16 (L) >60 mL/min   Anion gap 13 5 - 15    Comment: Performed at [facility], [place] [address], [place], [place] [address]  CBC with Differential/Platelet     Status: Abnormal   Collection Time: 10/08/19  5:32 AM  Result Value Ref Range   WBC 20.7 (H) 4.0 - 10.5 K/uL   RBC 2.68 (L) 3.87 - 5.11 MIL/uL   Hemoglobin 7.5 (L) 12.0 - 15.0 g/dL   HCT 25.0 (L) 36.0 - 46.0 %   MCV 93.3 80.0 - 100.0 fL   MCH 28.0 26.0 - 34.0 pg   MCHC 30.0 30.0 - 36.0 g/dL   RDW 16.0 (H) 11.5 - 15.5 %   Platelets 192 150 - 400 K/uL   nRBC 1.1 (H) 0.0 - 0.2 %   Neutrophils Relative % 79 %   Neutro Abs 16.4 (H) 1.7 - 7.7 K/uL   Lymphocytes Relative 11 %   Lymphs Abs 2.2 0.7 - 4.0 K/uL   Monocytes Relative 4 %   Monocytes Absolute 0.8 0.1 - 1.0 K/uL   Eosinophils Relative 0 %   Eosinophils Absolute 0.0 0.0 - 0.5 K/uL   Basophils Relative 0 %   Basophils Absolute 0.1 0.0 - 0.1 K/uL   RBC Morphology RNRBC PRESENT    Immature Granulocytes 6 %   Abs Immature Granulocytes 1.26 (H) 0.00 - 0.07 K/uL   Acanthocytes PRESENT    Polychromasia PRESENT     Comment: Performed at [facility], [place] [address] [place], [place] [address]  Magnesium     Status: None   Collection Time: 10/08/19  5:32 AM  Result Value Ref Range   Magnesium 1.8 1.7 - 2.4 mg/dL    Comment: Performed at [facility], [place] [address], [place], [place] [address]  Phosphorus     Status: None   Collection Time: 10/08/19  5:32 AM  Result Value Ref Range   Phosphorus 3.3 2.5 - 4.6 mg/dL    Comment: Performed at [facility], [place] [address] [place], [place]  [address]  Procalcitonin     Status: None   Collection Time: 10/08/19  5:32 AM  Result Value Ref Range   Procalcitonin 6.93 ng/mL    Comment:        Interpretation: PCT > 2 ng/mL: Systemic infection (sepsis) is likely, unless other causes are known. (NOTE)       Sepsis PCT Algorithm            Lower Respiratory Tract                                      Infection PCT Algorithm    ----------------------------     ----------------------------         PCT < 0.25 ng/mL                PCT < 0.10 ng/mL         Strongly encourage             Strongly discourage   discontinuation of antibiotics    initiation of antibiotics    ----------------------------     -----------------------------       PCT 0.25 - 0.50 ng/mL            PCT 0.10 - 0.25 ng/mL               OR       >80% decrease in PCT            Discourage initiation of                                            antibiotics      Encourage discontinuation           of antibiotics    ----------------------------     -----------------------------         PCT >= 0.50 ng/mL              PCT 0.26 - 0.50 ng/mL               AND       <80% decrease in PCT              Encourage initiation of                                             antibiotics       Encourage continuation           of antibiotics    ----------------------------     -----------------------------        PCT >= 0.50 ng/mL                  PCT > 0.50 ng/mL               AND         increase in PCT                  Strongly encourage                                      initiation of  antibiotics    Strongly encourage escalation           of antibiotics                                     -----------------------------                                           PCT <= 0.25 ng/mL                                                 OR                                        > 80% decrease in PCT                                     Discontinue / Do not initiate                                             antibiotics Performed at [place] [address] [place], [place] [address]   Lactic acid, plasma     Status: Abnormal   Collection Time: 10/08/19  5:32 AM  Result Value Ref Range   Lactic Acid, Venous 2.9 (HH) 0.5 - 1.9 mmol/L    Comment:  CRITICAL VALUE NOTED.  VALUE IS CONSISTENT WITH PREVIOUSLY REPORTED AND CALLED VALUE. Performed at [facility], [place] [address] [place], [place] [address]   Hemoglobin A1c     Status: Abnormal   Collection Time: 10/08/19  5:32 AM  Result Value Ref Range   Hgb A1c MFr Bld 6.4 (H) 4.8 - 5.6 %    Comment: (NOTE) Pre diabetes:          5.7%-6.4% Diabetes:              >6.4% Glycemic control for   <7.0% adults with diabetes    Mean Plasma Glucose 136.98 mg/dL    Comment: Performed at [place] [address], [place], [place] [address]  Glucose, capillary     Status: Abnormal   Collection Time: 10/08/19  7:48 AM  Result Value Ref Range   Glucose-Capillary 443 (H) 70 - 99 mg/dL   Comment 1 Notify RN    Comment 2 Document in Chart   Glucose, capillary     Status: Abnormal   Collection Time: 10/08/19 12:13 PM  Result Value Ref Range   Glucose-Capillary 351 (H) 70 - 99 mg/dL   Comment 1 Notify RN    Comment 2 Document in Chart   CBC with Differential/Platelet     Status: Abnormal   Collection Time: 10/08/19  4:57 PM  Result Value Ref Range   WBC 24.5 (H) 4.0 - 10.5 K/uL   RBC 2.70 (L) 3.87 - 5.11 MIL/uL   Hemoglobin 7.7 (L) 12.0 -  15.0 g/dL   HCT 24.9 (L) 36.0 - 46.0 %   MCV 92.2 80.0 - 100.0 fL   MCH 28.5 26.0 - 34.0 pg   MCHC 30.9 30.0 - 36.0 g/dL   RDW 15.9 (H) 11.5 - 15.5 %   Platelets 194 150 - 400 K/uL   nRBC 1.8 (H) 0.0 - 0.2 %   Neutrophils Relative % 75 %   Neutro Abs 18.3 (H) 1.7 - 7.7 K/uL   Lymphocytes Relative 14 %   Lymphs Abs 3.5 0.7 - 4.0 K/uL   Monocytes Relative 5 %   Monocytes Absolute 1.2 (H) 0.1 - 1.0 K/uL   Eosinophils Relative 0 %   Eosinophils Absolute 0.0 0.0 - 0.5 K/uL   Basophils Relative 0 %   Basophils Absolute 0.1 0.0 - 0.1 K/uL   Immature Granulocytes 6 %   Abs Immature Granulocytes 1.39 (H) 0.00 - 0.07 K/uL   Schistocytes PRESENT    Burr Cells PRESENT    Polychromasia PRESENT    Ovalocytes PRESENT      Comment: Performed at [facility], [place] [address], [place], [place] [address]  Glucose, capillary     Status: Abnormal   Collection Time: 10/08/19  5:37 PM  Result Value Ref Range   Glucose-Capillary 231 (H) 70 - 99 mg/dL  Glucose, capillary     Status: None   Collection Time: 10/08/19  9:32 PM  Result Value Ref Range   Glucose-Capillary 77 70 - 99 mg/dL   Comment 1 Notify RN    Comment 2 Document in Chart   CBC with Differential/Platelet     Status: Abnormal   Collection Time: 10/09/19  5:17 AM  Result Value Ref Range   WBC 29.9 (H) 4.0 - 10.5 K/uL    Comment: REPEATED TO VERIFY WHITE COUNT CONFIRMED ON SMEAR    RBC 2.85 (L) 3.87 - 5.11 MIL/uL   Hemoglobin 8.0 (L) 12.0 - 15.0 g/dL   HCT 25.9 (L) 36.0 - 46.0 %   MCV 90.9 80.0 - 100.0 fL   MCH 28.1 26.0 - 34.0 pg   MCHC 30.9 30.0 - 36.0 g/dL   RDW 15.7 (H) 11.5 - 15.5 %   Platelets 213 150 - 400 K/uL   nRBC 1.3 (H) 0.0 - 0.2 %   Neutrophils Relative % 73 %   Neutro Abs 22.0 (H) 1.7 - 7.7 K/uL   Lymphocytes Relative 16 %   Lymphs Abs 4.8 (H) 0.7 - 4.0 K/uL   Monocytes Relative 5 %   Monocytes Absolute 1.5 (H) 0.1 - 1.0 K/uL   Eosinophils Relative 0 %   Eosinophils Absolute 0.1 0.0 - 0.5 K/uL   Basophils Relative 1 %   Basophils Absolute 0.2 (H) 0.0 - 0.1 K/uL   WBC Morphology MILD LEFT SHIFT (1-5% METAS, OCC MYELO, OCC BANDS)    RBC Morphology POLYCHROMASIA PRESENT     Comment: Schistocytes present RARE NUCLEATED RED BLOOD CELLS PRESENT    Immature Granulocytes 5 %   Abs Immature Granulocytes 1.47 (H) 0.00 - 0.07 K/uL    Comment: Performed at [facility], [place] [address], [place], [place] [id number]  Basic metabolic panel     Status: Abnormal   Collection Time: 10/09/19  5:17 AM  Result Value Ref Range   Sodium 138 135 - 145 mmol/L   Potassium 3.1 (L) 3.5 - 5.1 mmol/L    Comment: DELTA CHECK NOTED   Chloride 102 98 - 111 mmol/L   CO2 26  22 - 32 mmol/L   Glucose, Bld 48 (L) 70 -  99 mg/dL   BUN 78 (H) 8 - 23 mg/dL   Creatinine, Ser 3.16 (H) 0.44 - 1.00 mg/dL   Calcium 7.4 (L) 8.9 - 10.3 mg/dL   GFR calc non Af Amer 14 (L) >60 mL/min   GFR calc Af Amer 16 (L) >60 mL/min   Anion gap 10 5 - 15    Comment: Performed at [facility], [place] [address] [place], [place] [address]  Glucose, capillary     Status: Abnormal   Collection Time: 10/09/19  9:01 AM  Result Value Ref Range   Glucose-Capillary 51 (L) 70 - 99 mg/dL   Comment 1 Notify RN    Comment 2 Document in Chart   Glucose, capillary     Status: None   Collection Time: 10/09/19  9:33 AM  Result Value Ref Range   Glucose-Capillary 74 70 - 99 mg/dL  Glucose, capillary     Status: Abnormal   Collection Time: 10/09/19 11:38 AM  Result Value Ref Range   Glucose-Capillary 101 (H) 70 - 99 mg/dL   Comment 1 Notify RN    Comment 2 Document in Chart     No results found.  Assessment/Plan **AKI on CKD:  Baseline renal function Cr 1.5 -2 and now ~3 in setting of sepsis and obstruction -- from what I can tell foley was in place on presentation but possibly obstructed with hydro noted on initial CT; Foley exchanged. F/u Renal US showed improved hydronephrosis.  BPs have been borderline.  No contrast exposure, no NSAIDs.  No rashes or fevers to suggest AIN but time course raises the question -- will send UA again to see if WBC casts visualized.    At this point would just continue supportive care --> IV fluids/diuretics to maintain euvolemia, avoid nephrotoxins.  Will just need time for recovery and may not achieve prior baseline.    **Sepsis secondary to polymicrobial bacteremia and UTI:  Antibiotics per ID -- planning 14 d course zosyn.  Worsening leukocytosis with neutrophilic predominance but afebrile.   **HFrEF:  Cardiology following.  No BB and RAAS inhibition with low BP and CKD.  Certainly edematous but it's extravascular so will need to proceed cautiously diuresing.  Lasix 40 IV x 1 today and  will determine dose daily.  Low na diet, daily weights.   **ABLA secondary to presumed GIB:  Hb initially 10.1 now in the 8s.  No IV iron in setting of bacteremia.  CTM, may dose with ESA prior to discharge pending course.   **Hypokalemia: supplement PRN  **Adrenal insufficiency:  On florinef and midodrine.  In future would consider stress dose steroids if septic.   **h/o CVA: per primary.   **R complex cyst: stable, CTM outpt.   Justin Mend 10/09/2019, 12:12 PM

## 2019-10-09 NOTE — Consult Note (Addendum)
Cardiology Consultation:   Patient ID: Stephanie Olson MRN: [id number]; DOB: [birth year]  Admit date: 10/01/2019 Date of Consult: 10/09/2019  Primary Care Provider: Benito Mccreedy, MD Primary Cardiologist: New to [facility]   Patient Profile:   Stephanie Olson is a 79 y.o. 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 Nevada Crane.  History of Present Illness:   Ms. Frierson presented from SNF ([facility]) 1/24 with abdominal pain and AMS. She was admitted for sepsis 2nd to acute pyelonephritis in setting chronic foley/bladder obstruction and multifocal pneumonia. COVID negative. 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 Blood culture grown Enterococcus and gram-negative and urine culture with Proteus and providencia. Seen by ID and treated with broad spectrum abx. Hospital course complicated by acute GI bleed on 1/29. Noted multiple bark/maroon color stool. Hemoglobin trended down to 7.5. Seen by GI and underwent EGD showing duodenal ulcer, no active bleeding.   Echo 1/26 showed LVEF of 35-40%, wm abnormality and moderate DD. Cardiology is asked for further evaluation.   History limited due to confusion.  Very soft-spoken.  Denies pain currently.  Heart Pathway Score:     Past Medical History:  Diagnosis Date  . Arthritis   . Diabetes mellitus without complication ([place])   . Diabetic foot ulcers (H[facility])    RIGHT   . Hypertension   . Peripheral vascular disease [facility])     Past Surgical History:  Procedure Laterality Date  . ABDOMINAL AORTAGRAM  01/29/2015  . ATHERECTOMY Right 01/29/2015   FEMORAL ARTERY   . BALLOON ANGIOPLASTY, ARTERY Right 01/29/2015   RT FEMORAL   . FOOT SURGERY    . PERIPHERAL VASCULAR CATHETERIZATION N/A 01/29/2015   Procedure: Abdominal Aortogram w/Lower Extremity;  Surgeon: Serafina Mitchell, MD;  Location: [facility];  Service: Cardiovascular;  Laterality: N/A;     Inpatient Medications: Scheduled Meds: . aspirin EC  81 mg Oral Daily  . Chlorhexidine Gluconate Cloth  6 each Topical Daily  . DULoxetine  20 mg Oral Daily  . fludrocortisone  0.1 mg Oral Daily  . furosemide  20 mg Oral Daily  . insulin aspart  0-15 Units Subcutaneous TID WC  . insulin aspart  5 Units Subcutaneous TID WC  . insulin glargine  7 Units Subcutaneous BID  . mouth rinse  15 mL Mouth Rinse BID  . midodrine  2.5 mg Oral TID WC  . pantoprazole  40 mg Oral BID AC  . potassium chloride  40 mEq Oral Once  . pravastatin  10 mg Oral QHS  . sodium bicarbonate  1,300 mg Oral BID   Continuous Infusions: . piperacillin-tazobactam (ZOSYN)  IV Stopped (10/09/19 0720)   PRN Meds: acetaminophen **OR** acetaminophen, bisacodyl, haloperidol lactate, metoprolol tartrate, ondansetron **OR** ondansetron (ZOFRAN) IV  Allergies:   No Known Allergies  Social History:   Social History   Socioeconomic History  . Marital status: Divorced    Spouse name: Not on file  . Number of children: Not on file  . Years of education: Not on file  . Highest education level: Not on file  Occupational History  . Not on file  Tobacco Use  . Smoking status: Current Some Day Smoker    Types: Cigarettes    Last attempt to quit: 02/13/2014    Years since quitting: 5.6  . Smokeless tobacco:  Never Used  . Tobacco comment: " I quit smoking along time ago "  Substance and Sexual Activity  . Alcohol use: No    Alcohol/week: 0.0 standard drinks  . Drug use: No  . Sexual activity: Not Currently  Other Topics Concern  . Not on file  Social History Narrative  . Not on file   Social Determinants of Health   Financial Resource Strain:   . Difficulty of Paying Living Expenses: Not on file  Food Insecurity:   . Worried About Charity fundraiser in the Last Year: Not on file  . Ran Out of Food in the Last Year: Not on file  Transportation Needs:   .  Lack of Transportation (Medical): Not on file  . Lack of Transportation (Non-Medical): Not on file  Physical Activity:   . Days of Exercise per Week: Not on file  . Minutes of Exercise per Session: Not on file  Stress:   . Feeling of Stress : Not on file  Social Connections:   . Frequency of Communication with Friends and Family: Not on file  . Frequency of Social Gatherings with Friends and Family: Not on file  . Attends Religious Services: Not on file  . Active Member of Clubs or Organizations: Not on file  . Attends Archivist Meetings: Not on file  . Marital Status: Not on file  Intimate Partner Violence:   . Fear of Current or Ex-Partner: Not on file  . Emotionally Abused: Not on file  . Physically Abused: Not on file  . Sexually Abused: Not on file    Family History:   Family History  Problem Relation Age of Onset  . Hypertension Mother   . Hypertension Father      ROS:  Please see the history of present illness. All other ROS reviewed and negative.    Physical Exam/Data:   Vitals:   10/09/19 0300 10/09/19 0421 10/09/19 0500 10/09/19 0600  BP: 96/64 (!) 100/53 (!) 90/44 (!) 94/47  Pulse: 91 96 93 90  Resp: (!) 8 20 (!) 9 (!) 8  Temp: (!) 97.2 F (36.2 C) (!) 97.2 F (36.2 C) (!) 97.2 F (36.2 C) (!) 97.2 F (36.2 C)  TempSrc:      SpO2: 100% 98% 99% 98%  Weight:    82 kg  Height:        Intake/Output Summary (Last 24 hours) at 10/09/2019 0810 Last data filed at 10/09/2019 0650 Gross per 24 hour  Intake 2115.81 ml  Output 905 ml  Net 1210.81 ml   Last 3 Weights 10/09/2019 10/01/2019 10/01/2019  Weight (lbs) 180 lb 12.4 oz 149 lb 4 oz 159 lb 2.8 oz  Weight (kg) 82 kg 67.7 kg 72.2 kg     Body mass index is 29.18 kg/m.  General: Elderly ill-appearing female in no acute distress HEENT: normal Lymph: no adenopathy Neck: no JVD Endocrine:  No thryomegaly Vascular: No carotid bruits; FA pulses 2+ bilaterally without bruits  Cardiac:  normal S1, S2;  RRR; no murmur  Lungs:  clear to auscultation bilaterally, no wheezing, rhonchi or rales  Abd: soft, nontender, no hepatomegaly  Ext: no edema Musculoskeletal:  No deformities, BUE and BLE strength normal and equal Skin: warm and dry  Neuro: no focal abnormalities noted alert and oriented to name Psych: Demented  EKG:  The EKG 10/08/19 was personally reviewed and demonstrates: SR, LBBB and TWI in anterior lateral leads.  Telemetry:  Telemetry was personally reviewed and  demonstrates: Normal sinus rhythm/sinus tachycardia with frequent PVC  Relevant CV Studies:  Echocardiogram October 03, 2019 1. Left ventricular ejection fraction, by visual estimation, is 35 to  40%. The left ventricle has moderately decreased function. There is no  left ventricular hypertrophy.  2. Severe hypokinesis of the left ventricular, entire apical segment.  3. Severe hypokinesis of the left ventricular, entire inferior wall and  inferolateral wall.  4. Indeterminate diastolic filling due to E-A fusion.  5. The left ventricle demonstrates regional wall motion abnormalities.  6. Global right ventricle has normal systolic function.The right  ventricular size is normal. No increase in right ventricular wall  thickness.  7. Left atrial size was normal.  8. Right atrial size was normal.  9. Mild mitral annular calcification.  10. The mitral valve is normal in structure. Mild mitral valve  regurgitation.  11. The tricuspid valve is normal in structure.  12. The tricuspid valve is normal in structure. Tricuspid valve  regurgitation is not demonstrated.  13. The aortic valve is tricuspid. Aortic valve regurgitation is not  visualized. Mild aortic valve sclerosis without stenosis.  14. The pulmonic valve was normal in structure. Pulmonic valve  regurgitation is not visualized.   Laboratory Data:  High Sensitivity Troponin:  No results for input(s): TROPONINIHS in the last 720 hours.   Chemistry Recent  Labs  Lab 10/07/19 [phone] 10/07/19 0412 10/07/19 2102 10/08/19 0532 10/09/19 0517  NA 143  --   --  135 138  K 4.2  --   --  3.9 3.1*  CL 115*  --   --  102 102  CO2 18*  --   --  20* 26  GLUCOSE 229*  --   --  378* 48*  BUN 87*   < > 84* 80* 78*  CREATININE 3.10*  --   --  3.05* 3.16*  CALCIUM 7.9*  --   --  7.5* 7.4*  GFRNONAA 14*  --   --  14* 14*  GFRAA 16*  --   --  16* 16*  ANIONGAP 10  --   --  13 10   < > = values in this interval not displayed.    Hematology Recent Labs  Lab 10/08/19 0532 10/08/19 1657 10/09/19 0517  WBC 20.7* 24.5* 29.9*  RBC 2.68* 2.70* 2.85*  HGB 7.5* 7.7* 8.0*  HCT 25.0* 24.9* 25.9*  MCV 93.3 92.2 90.9  MCH 28.0 28.5 28.1  MCHC 30.0 30.9 30.9  RDW 16.0* 15.9* 15.7*  PLT 192 194 213    Radiology/Studies:  US RENAL  Result Date: 10-19-2019 CLINICAL DATA:  Follow-up collecting system dilatation on recent CT EXAM: RENAL / URINARY TRACT ULTRASOUND COMPLETE COMPARISON:  09/30/2018 FINDINGS: Right Kidney: Renal measurements: 9.2 x 4.3 x 4.3 cm. = volume: 89 mL. 2 cm cyst is noted within the upper pole of the right kidney with some slight increased echogenicity within. This has been seen on previous ultrasound examinations. Left Kidney: Renal measurements: 8.3 x 4.2 x 5.0 cm. = volume: 92 mL. Echogenicity within normal limits. No mass or hydronephrosis visualized. Bladder: Decompressed by Foley catheter. Other: None. IMPRESSION: Previously seen dilatation of the collecting systems and ureters bilaterally has resolved consistent with decompression of the bladder by the Foley catheter. The previously seen dilatation is likely related to the overly distended bladder. Complex cyst in the upper pole of the right kidney stable from previous ultrasounds. Electronically Signed   By: [name] M.D.   On: 10/19/19 08:39  Assessment and Plan:   1. Acute combined CHF Echocardiogram this admission showed newly depressed LV function to 35 to 40%, severe  hypokinesis of entire apical, inferior and inferior lateral wall.  EKG with new T wave inversion in the anterior lateral territory.  Patient will need ischemic evaluation at some point.  Volume status appears stable despite fluid resuscitation initially.  Breathing is stable.  Soft blood pressure prohibits addition of advanced heart failure regimen. -On midodrine 2.5 mg 3 times daily  2.  Sepsis secondary to polymicrobial bacteremia from pneumonia and pyelonephritis in setting of chronic Foley -On broad-spectrum antibiotic per ID  3.  Altered mental status -CT of head showed evidence of prior CVA -Pending MRI of brain  4.  Acute GI bleed -Conservative management per GI -Hemoglobin stable at 8  5.  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 Hypokalemia -Will supplement  For questions or updates, please contact [place] Please consult [email or web address] for contact info under     Signed, Leanor Kail, PA  10/09/2019 8:10 AM   I have seen and examined the patient along with Leanor Kail, PA .  I have reviewed the chart, notes and new data.  I agree with PA/NP's note.  Key new complaints: Not sure she understands my questions, but as far as I can tell she does not have (and has not had) chest pain or dyspnea. Key examination changes: no clinical signs of CHF Key new findings / data: echo shows clear regional wall motion abnormalities, of uncertain chronicity. She has well-established PAD, at least since 2016 (R SFA atherectomy, Dr. Trula Slade), so CAD would not be unexpected. Rate related LBBB.  PLAN: Unfortunately, intense coronary evaluation and treatment is not currently an option due to acute (on chronic) renal insufficiency and recent GI bleeding. Fortunately, she does not have symptoms, ECG changes or biomarkers that suggest recent acute coronary event or  acute heart failure, so workup can be safely delayed.  After she stabilizes, she would ideally undergo coronary angiography, but even her baseline renal function (creat 1.9, GFR 25-30) is concerning for contrast nephrotoxicity. Would recommend a nuclear study [facility] Myoview) as an outpatient and proceed with heart cath only if there are sizeable areas of reversible ischemia. Meanwhile, treat with statin to target LDL<70 (increase pravastatin to 40 mg daily in the evening), add ASA when deemed safe from GI point of view. BP precludes use of beta blockers and renal dysfunction precludes RAAS inhibitors.  Sanda Klein, MD, [place] [phone] 10/09/2019, 11:57 AM

## 2019-10-09 NOTE — Progress Notes (Addendum)
Patient ID: Stephanie Olson, female   DOB: July 18, 1941, 79 y.o.   MRN: [id number]         [facility] for Infectious Disease  Date of Admission:  10/01/2019   Total days of antibiotics 9        Day 1 piperacillin tazobactam         ASSESSMENT: Despite worsening leukocytosis, she seems to be improving overall on therapy for polymicrobial bacteremia complicating a urinary tract infection.  She remains afebrile and repeat cultures are negative.  Her bilateral hydronephrosis that was found on CT promptly resolved on her recent ultrasound.  PLAN: 1. Continue piperacillin tazobactam for 5 more days  Principal Problem:   Severe sepsis (HCC) Active Problems:   Acute pyelonephritis   Bacteremia due to Enterococcus   Bacteremia due to Gram-negative bacteria   Renal failure (ARF), acute on chronic (HCC)   Pressure injury of skin   Malnutrition of moderate degree   Lactic acidosis   Hypotension   Hypokalemia   Chronic indwelling Foley catheter   Sinus tachycardia   Scheduled Meds: . aspirin EC  81 mg Oral Daily  . Chlorhexidine Gluconate Cloth  6 each Topical Daily  . DULoxetine  20 mg Oral Daily  . fludrocortisone  0.1 mg Oral Daily  . furosemide  20 mg Oral Daily  . insulin aspart  0-5 Units Subcutaneous QHS  . insulin aspart  0-9 Units Subcutaneous TID WC  . insulin aspart  3 Units Subcutaneous TID WC  . [START ON 10/10/2019] insulin glargine  4 Units Subcutaneous QHS  . mouth rinse  15 mL Mouth Rinse BID  . midodrine  2.5 mg Oral TID WC  . pantoprazole  40 mg Oral BID AC  . potassium chloride  40 mEq Oral BID  . pravastatin  40 mg Oral QHS   Continuous Infusions: . piperacillin-tazobactam (ZOSYN)  IV 2.25 g (10/09/19 1538)   PRN Meds:.acetaminophen **OR** acetaminophen, bisacodyl, haloperidol lactate, metoprolol tartrate, ondansetron **OR** ondansetron (ZOFRAN) IV   SUBJECTIVE: She seems to indicate that she is feeling better.  No pain.  Review of Systems: Review of  Systems  Constitutional: Negative for fever.  Gastrointestinal: Negative for abdominal pain.    No Known Allergies  OBJECTIVE: Vitals:   10/09/19 0900 10/09/19 1000 10/09/19 1100 10/09/19 1200  BP: (!) 96/51 103/63 (!) 93/57 107/67  Pulse: 99 96 99 97  Resp: 20 (!) 22 (!) 24 17  Temp: (!) 97.2 F (36.2 C) (!) 97.3 F (36.3 C) (!) 97.5 F (36.4 C) 97.7 F (36.5 C)  TempSrc:      SpO2: 95% 99% 98% 97%  Weight:      Height:       Body mass index is 29.18 kg/m.  Physical Exam Constitutional:      Comments: She is alert and pleasant.  She will answer some questions with short, 1 word answers.  Cardiovascular:     Rate and Rhythm: Normal rate and regular rhythm.     Heart sounds: No murmur.  Pulmonary:     Effort: Pulmonary effort is normal.     Breath sounds: Normal breath sounds.  Abdominal:     Palpations: Abdomen is soft.     Tenderness: There is no abdominal tenderness.  Musculoskeletal:        General: No swelling or tenderness.  Skin:    Findings: No rash.     Comments: Left hand IV site looks good.     Lab Results Lab  Results  Component Value Date   WBC 29.9 (H) 10/09/2019   HGB 8.0 (L) 10/09/2019   HCT 25.9 (L) 10/09/2019   MCV 90.9 10/09/2019   PLT 213 10/09/2019    Lab Results  Component Value Date   CREATININE 3.16 (H) 10/09/2019   BUN 78 (H) 10/09/2019   NA 138 10/09/2019   K 3.1 (L) 10/09/2019   CL 102 10/09/2019   CO2 26 10/09/2019    Lab Results  Component Value Date   ALT 10 10/02/2019   AST 30 10/02/2019   ALKPHOS 58 10/02/2019   BILITOT 0.7 10/02/2019     Microbiology: Recent Results (from the past 240 hour(s))  Blood Culture (routine x 2)     Status: Abnormal   Collection Time: 10/01/19 12:12 PM   Specimen: BLOOD  Result Value Ref Range Status   Specimen Description   Final    BLOOD RIGHT WRIST Performed at [facility] [address] [place], [place] [address]    Special Requests   Final    BOTTLES  DRAWN AEROBIC AND ANAEROBIC Blood Culture results may not be optimal due to an inadequate volume of blood received in culture bottles Performed at [facility] [address] [place], [place] [address]    Culture  Setup Time   Final    GRAM POSITIVE COCCI GRAM NEGATIVE RODS CRITICAL VALUE NOTED.  VALUE IS CONSISTENT WITH PREVIOUSLY REPORTED AND CALLED VALUE.    Culture (A)  Final    PROTEUS MIRABILIS PROVIDENCIA STUARTII ENTEROCOCCUS FAECALIS SUSCEPTIBILITIES PERFORMED ON PREVIOUS CULTURE WITHIN THE LAST 5 DAYS. Performed at [facility], [place] [address], [place], [place] [address]    Report Status 10/08/2019 FINAL  Final   Organism ID, Bacteria ENTEROCOCCUS FAECALIS  Final      Susceptibility   Enterococcus faecalis - MIC*    AMPICILLIN <=2 SENSITIVE Sensitive     VANCOMYCIN 2 SENSITIVE Sensitive     GENTAMICIN SYNERGY SENSITIVE Sensitive     * ENTEROCOCCUS FAECALIS  Blood Culture (routine x 2)     Status: Abnormal   Collection Time: 10/01/19 12:13 PM   Specimen: BLOOD LEFT FOREARM  Result Value Ref Range Status   Specimen Description   Final    BLOOD LEFT FOREARM Performed at [facility] [address], [place], [place] [address]    Special Requests   Final    BOTTLES DRAWN AEROBIC AND ANAEROBIC Blood Culture results may not be optimal due to an inadequate volume of blood received in culture bottles Performed at [facility] [address] [place], [place] [address]    Culture  Setup Time   Final    GRAM POSITIVE COCCI GRAM NEGATIVE RODS IN BOTH AEROBIC AND ANAEROBIC BOTTLES CRITICAL RESULT CALLED TO, READ BACK BY AND VERIFIED WITH: PHARMD A. PHAM 0840 [id number] FCP    Culture (A)  Final    PROTEUS MIRABILIS PROVIDENCIA STUARTII ENTEROCOCCUS FAECALIS SUSCEPTIBILITIES PERFORMED ON PREVIOUS CULTURE WITHIN THE LAST 5 DAYS. Performed at [facility], [place] [address] [place], [place] [address]     Report Status 10/08/2019 FINAL  Final   Organism ID, Bacteria PROTEUS MIRABILIS  Final   Organism ID, Bacteria PROVIDENCIA STUARTII  Final      Susceptibility   Proteus mirabilis - MIC*    AMPICILLIN <=2 SENSITIVE Sensitive     CEFAZOLIN <=4 SENSITIVE Sensitive     CEFEPIME <=0.12 SENSITIVE Sensitive  CEFTAZIDIME <=1 SENSITIVE Sensitive     CEFTRIAXONE <=0.25 SENSITIVE Sensitive     CIPROFLOXACIN 1 SENSITIVE Sensitive     GENTAMICIN <=1 SENSITIVE Sensitive     IMIPENEM 4 SENSITIVE Sensitive     TRIMETH/SULFA <=20 SENSITIVE Sensitive     AMPICILLIN/SULBACTAM <=2 SENSITIVE Sensitive     PIP/TAZO <=4 SENSITIVE Sensitive     * PROTEUS MIRABILIS   Providencia stuartii - MIC*    AMPICILLIN >=32 RESISTANT Resistant     CEFAZOLIN >=64 RESISTANT Resistant     CEFEPIME <=0.12 SENSITIVE Sensitive     CEFTAZIDIME <=1 SENSITIVE Sensitive     CEFTRIAXONE <=0.25 SENSITIVE Sensitive     CIPROFLOXACIN >=4 RESISTANT Resistant     GENTAMICIN RESISTANT Resistant     IMIPENEM 1 SENSITIVE Sensitive     TRIMETH/SULFA >=320 RESISTANT Resistant     AMPICILLIN/SULBACTAM >=32 RESISTANT Resistant     PIP/TAZO <=4 SENSITIVE Sensitive     * PROVIDENCIA STUARTII  Blood Culture ID Panel (Reflexed)     Status: Abnormal   Collection Time: 10/01/19 12:13 PM  Result Value Ref Range Status   Enterococcus species DETECTED (A) NOT DETECTED Final    Comment: CRITICAL RESULT CALLED TO, READ BACK BY AND VERIFIED WITH: PHARMD A. PHAM 0840 [id number] FCP    Vancomycin resistance NOT DETECTED NOT DETECTED Final   Listeria monocytogenes NOT DETECTED NOT DETECTED Final   Staphylococcus species NOT DETECTED NOT DETECTED Final   Staphylococcus aureus (BCID) NOT DETECTED NOT DETECTED Final   Streptococcus species NOT DETECTED NOT DETECTED Final   Streptococcus agalactiae NOT DETECTED NOT DETECTED Final   Streptococcus pneumoniae NOT DETECTED NOT DETECTED Final   Streptococcus pyogenes NOT DETECTED NOT DETECTED Final    Acinetobacter baumannii NOT DETECTED NOT DETECTED Final   Enterobacteriaceae species DETECTED (A) NOT DETECTED Final    Comment: Enterobacteriaceae represent a large family of gram-negative bacteria, not a single organism. CRITICAL RESULT CALLED TO, READ BACK BY AND VERIFIED WITH: PHARMD A. PHAM 0840 [id number] FCP    Enterobacter cloacae complex NOT DETECTED NOT DETECTED Final   Escherichia coli NOT DETECTED NOT DETECTED Final   Klebsiella oxytoca NOT DETECTED NOT DETECTED Final   Klebsiella pneumoniae NOT DETECTED NOT DETECTED Final   Proteus species DETECTED (A) NOT DETECTED Final    Comment: CRITICAL RESULT CALLED TO, READ BACK BY AND VERIFIED WITH: PHARMD A. PHAM 0840 [id number] FCP    Serratia marcescens NOT DETECTED NOT DETECTED Final   Carbapenem resistance NOT DETECTED NOT DETECTED Final   Haemophilus influenzae NOT DETECTED NOT DETECTED Final   Neisseria meningitidis NOT DETECTED NOT DETECTED Final   Pseudomonas aeruginosa NOT DETECTED NOT DETECTED Final   Candida albicans NOT DETECTED NOT DETECTED Final   Candida glabrata NOT DETECTED NOT DETECTED Final   Candida krusei NOT DETECTED NOT DETECTED Final   Candida parapsilosis NOT DETECTED NOT DETECTED Final   Candida tropicalis NOT DETECTED NOT DETECTED Final    Comment: Performed at [facility], [employer] [address], [place], [place] [address]  Urine culture     Status: Abnormal   Collection Time: 10/01/19  2:27 PM   Specimen: In/Out Cath Urine  Result Value Ref Range Status   Specimen Description   Final    IN/OUT CATH URINE Performed at [facility] [address] [place], [place] [address]    Special Requests   Final    NONE Performed at [facility], [place] [address], [place], [place] [address]  Culture (A)  Final    >=100,000 COLONIES/mL PROVIDENCIA STUARTII >=100,000 COLONIES/mL PROTEUS MIRABILIS    Report Status 10/05/2019 FINAL  Final   Organism ID, Bacteria PROVIDENCIA  STUARTII (A)  Final   Organism ID, Bacteria PROTEUS MIRABILIS (A)  Final      Susceptibility   Proteus mirabilis - MIC*    AMPICILLIN <=2 SENSITIVE Sensitive     CEFAZOLIN <=4 SENSITIVE Sensitive     CEFTRIAXONE <=0.25 SENSITIVE Sensitive     CIPROFLOXACIN 1 SENSITIVE Sensitive     GENTAMICIN <=1 SENSITIVE Sensitive     IMIPENEM 8 INTERMEDIATE Intermediate     NITROFURANTOIN 256 RESISTANT Resistant     TRIMETH/SULFA <=20 SENSITIVE Sensitive     AMPICILLIN/SULBACTAM <=2 SENSITIVE Sensitive     PIP/TAZO <=4 SENSITIVE Sensitive     * >=100,000 COLONIES/mL PROTEUS MIRABILIS   Providencia stuartii - MIC*    AMPICILLIN >=32 RESISTANT Resistant     CEFAZOLIN >=64 RESISTANT Resistant     CEFTRIAXONE <=0.25 SENSITIVE Sensitive     CIPROFLOXACIN >=4 RESISTANT Resistant     GENTAMICIN RESISTANT Resistant     IMIPENEM 2 SENSITIVE Sensitive     NITROFURANTOIN 128 RESISTANT Resistant     TRIMETH/SULFA >=320 RESISTANT Resistant     AMPICILLIN/SULBACTAM >=32 RESISTANT Resistant     PIP/TAZO <=4 SENSITIVE Sensitive     * >=100,000 COLONIES/mL PROVIDENCIA STUARTII  Respiratory Panel by RT PCR (Flu A&B, Covid) - Urine, Catheterized     Status: None   Collection Time: 10/01/19  2:27 PM   Specimen: Urine, Catheterized  Result Value Ref Range Status   SARS Coronavirus 2 by RT PCR NEGATIVE NEGATIVE Final    Comment: (NOTE) SARS-CoV-2 target nucleic acids are NOT DETECTED. The SARS-CoV-2 RNA is generally detectable in upper respiratoy specimens during the acute phase of infection. The lowest concentration of SARS-CoV-2 viral copies this assay can detect is 131 copies/mL. A negative result does not preclude SARS-Cov-2 infection and should not be used as the sole basis for treatment or other patient management decisions. A negative result may occur with  improper specimen collection/handling, submission of specimen other than nasopharyngeal swab, presence of viral mutation(s) within the areas  targeted by this assay, and inadequate number of viral copies (<131 copies/mL). A negative result must be combined with clinical observations, patient history, and epidemiological information. The expected result is Negative. Fact Sheet for Patients:  PinkCheek.be Fact Sheet for Healthcare Providers:  GravelBags.it This test is not yet ap proved or cleared by the [place] FDA and  has been authorized for detection and/or diagnosis of SARS-CoV-2 by FDA under an Emergency Use Authorization (EUA). This EUA will remain  in effect (meaning this test can be used) for the duration of the COVID-19 declaration under Section 564(b)(1) of the Act, 21 U.S.C. section 360bbb-3(b)(1), unless the authorization is terminated or revoked sooner.    Influenza A by PCR NEGATIVE NEGATIVE Final   Influenza B by PCR NEGATIVE NEGATIVE Final    Comment: (NOTE) The Xpert Xpress SARS-CoV-2/FLU/RSV assay is intended as an aid in  the diagnosis of influenza from Nasopharyngeal swab specimens and  should not be used as a sole basis for treatment. Nasal washings and  aspirates are unacceptable for Xpert Xpress SARS-CoV-2/FLU/RSV  testing. Fact Sheet for Patients: PinkCheek.be Fact Sheet for Healthcare Providers: GravelBags.it This test is not yet approved or cleared by the [place] FDA and  has been authorized for detection and/or diagnosis of SARS-CoV-2 by  FDA under  an Emergency Use Authorization (EUA). This EUA will remain  in effect (meaning this test can be used) for the duration of the  Covid-19 declaration under Section 564(b)(1) of the Act, 21  U.S.C. section 360bbb-3(b)(1), unless the authorization is  terminated or revoked. Performed at [facility], [place] [address] [place], [place] [address]   MRSA PCR Screening     Status: None   Collection Time: 10/01/19   6:28 PM   Specimen: Nasopharyngeal  Result Value Ref Range Status   MRSA by PCR NEGATIVE NEGATIVE Final    Comment:        The GeneXpert MRSA Assay (FDA approved for NASAL specimens only), is one component of a comprehensive MRSA colonization surveillance program. It is not intended to diagnose MRSA infection nor to guide or monitor treatment for MRSA infections. Performed at [facility], [place] [address], [place], [place] [address]   Culture, blood (routine x 2)     Status: None   Collection Time: 10/03/19 12:49 AM   Specimen: BLOOD  Result Value Ref Range Status   Specimen Description   Final    BLOOD RIGHT ARM Performed at [facility] [address], [place], [place] [address]    Special Requests   Final    BOTTLES DRAWN AEROBIC ONLY Blood Culture adequate volume Performed at [facility] [address], [place], [place] [address]    Culture   Final    NO GROWTH 5 DAYS Performed at [facility], [place] [address] [place], [place] [address]    Report Status 10/08/2019 FINAL  Final  Culture, blood (routine x 2)     Status: None   Collection Time: 10/03/19 12:49 AM   Specimen: BLOOD  Result Value Ref Range Status   Specimen Description   Final    BLOOD RIGHT HAND Performed at [facility] [address], [place], [place] [address]    Special Requests   Final    BOTTLES DRAWN AEROBIC ONLY Blood Culture adequate volume Performed at [facility] [address], [place], [place] [address]    Culture   Final    NO GROWTH 5 DAYS Performed at [facility], [place] [address], [place], [place] [address]    Report Status 10/08/2019 FINAL  Final    Michel Bickers, MD [facility] for Infectious [place] [phone] pager   [phone] cell 10/09/2019, 3:42 PM

## 2019-10-09 NOTE — Progress Notes (Signed)
Hypoglycemic Event  CBG: 51  Treatment: 4 oz juice/soda  Symptoms: None  Follow-up CBG: Time:0920 CBG Result:  Possible Reasons for Event: Unknown  Comments/MD notified: Dr. Nevada Crane notified    Teejay Meader, [place] D

## 2019-10-10 LAB — CBC WITH DIFFERENTIAL/PLATELET
Abs Immature Granulocytes: 1.09 10*3/uL — ABNORMAL HIGH (ref 0.00–0.07)
Basophils Absolute: 0.1 10*3/uL (ref 0.0–0.1)
Basophils Relative: 0 %
Eosinophils Absolute: 0.1 10*3/uL (ref 0.0–0.5)
Eosinophils Relative: 0 %
HCT: 27.3 % — ABNORMAL LOW (ref 36.0–46.0)
Hemoglobin: 8.3 g/dL — ABNORMAL LOW (ref 12.0–15.0)
Immature Granulocytes: 3 %
Lymphocytes Relative: 8 %
Lymphs Abs: 2.4 10*3/uL (ref 0.7–4.0)
MCH: 28.2 pg (ref 26.0–34.0)
MCHC: 30.4 g/dL (ref 30.0–36.0)
MCV: 92.9 fL (ref 80.0–100.0)
Monocytes Absolute: 1.1 10*3/uL — ABNORMAL HIGH (ref 0.1–1.0)
Monocytes Relative: 3 %
Neutro Abs: 27.4 10*3/uL — ABNORMAL HIGH (ref 1.7–7.7)
Neutrophils Relative %: 86 %
Platelets: 235 10*3/uL (ref 150–400)
RBC: 2.94 MIL/uL — ABNORMAL LOW (ref 3.87–5.11)
RDW: 15.9 % — ABNORMAL HIGH (ref 11.5–15.5)
WBC: 32.2 10*3/uL — ABNORMAL HIGH (ref 4.0–10.5)
nRBC: 0.3 % — ABNORMAL HIGH (ref 0.0–0.2)

## 2019-10-10 LAB — COMPREHENSIVE METABOLIC PANEL
ALT: 28 U/L (ref 0–44)
AST: 20 U/L (ref 15–41)
Albumin: 1.6 g/dL — ABNORMAL LOW (ref 3.5–5.0)
Alkaline Phosphatase: 46 U/L (ref 38–126)
Anion gap: 16 — ABNORMAL HIGH (ref 5–15)
BUN: 62 mg/dL — ABNORMAL HIGH (ref 8–23)
CO2: 22 mmol/L (ref 22–32)
Calcium: 7 mg/dL — ABNORMAL LOW (ref 8.9–10.3)
Chloride: 110 mmol/L (ref 98–111)
Creatinine, Ser: 3.05 mg/dL — ABNORMAL HIGH (ref 0.44–1.00)
GFR calc Af Amer: 16 mL/min — ABNORMAL LOW (ref >60)
GFR calc non Af Amer: 14 mL/min — ABNORMAL LOW (ref >60)
Glucose, Bld: 142 mg/dL — ABNORMAL HIGH (ref 70–99)
Potassium: 4.8 mmol/L (ref 3.5–5.1)
Sodium: 148 mmol/L — ABNORMAL HIGH (ref 135–145)
Total Bilirubin: 0.5 mg/dL (ref 0.3–1.2)
Total Protein: 4.2 g/dL — ABNORMAL LOW (ref 6.5–8.1)

## 2019-10-10 LAB — PROCALCITONIN: Procalcitonin: 3.55 ng/mL

## 2019-10-10 LAB — GLUCOSE, CAPILLARY
Glucose-Capillary: 152 mg/dL — ABNORMAL HIGH (ref 70–99)
Glucose-Capillary: 207 mg/dL — ABNORMAL HIGH (ref 70–99)
Glucose-Capillary: 238 mg/dL — ABNORMAL HIGH (ref 70–99)
Glucose-Capillary: 267 mg/dL — ABNORMAL HIGH (ref 70–99)

## 2019-10-10 LAB — BRAIN NATRIURETIC PEPTIDE: B Natriuretic Peptide: 2366 pg/mL — ABNORMAL HIGH (ref 0.0–100.0)

## 2019-10-10 LAB — LACTIC ACID, PLASMA: Lactic Acid, Venous: 1.5 mmol/L (ref 0.5–1.9)

## 2019-10-10 MED ORDER — ENSURE ENLIVE PO LIQD
237.0000 mL | Freq: Four times a day (QID) | ORAL | Status: DC
  Administered 2019-10-10 – 2019-10-14 (×11): 237 mL via ORAL
  Filled 2019-10-10 (×2): qty 237

## 2019-10-10 MED ORDER — FUROSEMIDE 10 MG/ML IJ SOLN
40.0000 mg | Freq: Once | INTRAMUSCULAR | Status: AC
  Administered 2019-10-10: 16:00:00 40 mg via INTRAVENOUS
  Filled 2019-10-10: qty 4

## 2019-10-10 NOTE — Progress Notes (Addendum)
Progress Note  Patient Name: Stephanie Olson Date of Encounter: 10/10/2019  Primary Cardiologist: Dr. Sallyanne Kuster  Subjective   No CP or dyspnea.   Inpatient Medications    Scheduled Meds: . aspirin EC  81 mg Oral Daily  . Chlorhexidine Gluconate Cloth  6 each Topical Daily  . DULoxetine  20 mg Oral Daily  . fludrocortisone  0.1 mg Oral Daily  . furosemide  20 mg Oral Daily  . insulin aspart  0-5 Units Subcutaneous QHS  . insulin aspart  0-9 Units Subcutaneous TID WC  . insulin aspart  3 Units Subcutaneous TID WC  . insulin glargine  4 Units Subcutaneous QHS  . mouth rinse  15 mL Mouth Rinse BID  . midodrine  2.5 mg Oral TID WC  . pantoprazole  40 mg Oral BID AC  . pravastatin  40 mg Oral QHS   Continuous Infusions: . piperacillin-tazobactam (ZOSYN)  IV Stopped (10/10/19 0722)   PRN Meds: acetaminophen **OR** acetaminophen, bisacodyl, haloperidol lactate, metoprolol tartrate, ondansetron **OR** ondansetron (ZOFRAN) IV   Vital Signs    Vitals:   10/10/19 0400 10/10/19 0500 10/10/19 0600 10/10/19 0700  BP: (!) 94/53 106/61 (!) 104/58 104/64  Pulse: 93 94 92 96  Resp: 17 17 (!) 8 (!) 23  Temp: 98.2 F (36.8 C)     TempSrc: Oral     SpO2: 97% 99% 99% 99%  Weight:  80.1 kg    Height:        Intake/Output Summary (Last 24 hours) at 10/10/2019 0928 Last data filed at 10/10/2019 0658 Gross per 24 hour  Intake 268.37 ml  Output 1850 ml  Net -1581.63 ml   Last 3 Weights 10/10/2019 10/09/2019 10/01/2019  Weight (lbs) 176 lb 9.4 oz 180 lb 12.4 oz 149 lb 4 oz  Weight (kg) 80.1 kg 82 kg 67.7 kg      Telemetry    SR, rate 90s, PACs - Personally Reviewed  ECG    No new tracing   Physical Exam   GEN: Chronically ill appearing female in no acute distress.   Neck: No JVD Cardiac: RRR, no murmurs, rubs, or gallops.  Respiratory: Clear to auscultation bilaterally. GI: Soft, nontender, non-distended  MS: No edema; No deformity. Neuro:  Nonfocal  Psych: dementia  Labs    High Sensitivity Troponin:  No results for input(s): TROPONINIHS in the last 720 hours.    Chemistry Recent Labs  Lab 10/08/19 0532 10/08/19 0532 10/09/19 0517 10/09/19 1711 10/10/19 0534  NA 135  --  138  --  148*  K 3.9   < > 3.1* 3.9 4.8  CL 102  --  102  --  110  CO2 20*  --  26  --  22  GLUCOSE 378*  --  48*  --  142*  BUN 80*  --  78*  --  62*  CREATININE 3.05*  --  3.16*  --  3.05*  CALCIUM 7.5*  --  7.4*  --  7.0*  PROT  --   --   --   --  4.2*  ALBUMIN  --   --   --   --  1.6*  AST  --   --   --   --  20  ALT  --   --   --   --  28  ALKPHOS  --   --   --   --  46  BILITOT  --   --   --   --  0.5  GFRNONAA 14*  --  14*  --  14*  GFRAA 16*  --  16*  --  16*  ANIONGAP 13  --  10  --  16*   < > = values in this interval not displayed.     Hematology Recent Labs  Lab 10/09/19 0517 10/09/19 1636 10/10/19 0534  WBC 29.9* 30.5* 32.2*  RBC 2.85* 2.99* 2.94*  HGB 8.0* 8.5* 8.3*  HCT 25.9* 27.1* 27.3*  MCV 90.9 90.6 92.9  MCH 28.1 28.4 28.2  MCHC 30.9 31.4 30.4  RDW 15.7* 15.9* 15.9*  PLT 213 215 235    BNP Recent Labs  Lab 10/10/19 0534  BNP 2,366.0*     DDimer No results for input(s): DDIMER in the last 168 hours.   Radiology    MR BRAIN WO CONTRAST  Result Date: 10/09/2019 CLINICAL DATA:  Encephalopathy EXAM: MRI HEAD WITHOUT CONTRAST TECHNIQUE: Multiplanar, multiecho pulse sequences of the brain and surrounding structures were obtained without intravenous contrast. COMPARISON:  Head CT 10/03/2019 FINDINGS: BRAIN: No acute infarct, acute hemorrhage or extra-axial collection. Diffuse confluent hyperintense T2-weighted signal within the periventricular, deep and juxtacortical white matter, most commonly due to chronic ischemic microangiopathy. There is generalized atrophy without lobar predilection. Midline structures are normal. There are multiple old supratentorial small vessel infarcts VASCULAR: Major flow voids are preserved. Susceptibility-sensitive  sequences show no chronic microhemorrhage or superficial siderosis. SKULL AND UPPER CERVICAL SPINE: Normal calvarium and skull base. Visualized upper cervical spine and soft tissues are normal. SINUSES/ORBITS: No fluid levels or advanced mucosal thickening. No mastoid or middle ear effusion. Normal orbits. IMPRESSION: 1. No acute intracranial process. 2. 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 3. Generalized atrophy and severe chronic small vessel ischemic microangiopathy. Electronically Signed   By: Ulyses Jarred M.D.   On: 10/09/2019 22:30    Cardiac Studies   Echocardiogram October 03, 2019 1. Left ventricular ejection fraction, by visual estimation, is 35 to  40%. The left ventricle has moderately decreased function. There is no  left ventricular hypertrophy.  2. Severe hypokinesis of the left ventricular, entire apical segment.  3. Severe hypokinesis of the left ventricular, entire inferior wall and  inferolateral wall.  4. Indeterminate diastolic filling due to E-A fusion.  5. The left ventricle demonstrates regional wall motion abnormalities.  6. Global right ventricle has normal systolic function.The right  ventricular size is normal. No increase in right ventricular wall  thickness.  7. Left atrial size was normal.  8. Right atrial size was normal.  9. Mild mitral annular calcification.  10. The mitral valve is normal in structure. Mild mitral valve  regurgitation.  11. The tricuspid valve is normal in structure.  12. The tricuspid valve is normal in structure. Tricuspid valve  regurgitation is not demonstrated.  13. The aortic valve is tricuspid. Aortic valve regurgitation is not  visualized. Mild aortic valve sclerosis without stenosis.  14. The pulmonic valve was normal in structure. Pulmonic valve  regurgitation is not visualized.   Patient Profile     Stephanie Olson is a 79 y.o. 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 [place] Crane  Assessment & Plan    1. Acute combined CHF Echocardiogram this admission showed newly depressed LV function to 35 to 40%, severe hypokinesis of entire apical, inferior and inferior lateral  wall.  EKG with new T wave inversion in the anterior lateral territory and rate related LBBB. Not a good candidate for invasive ischemic evaluation given renal function and recent GI bleed. recommend a nuclear study (Lexiscan Myoview) as an outpatient and proceed with heart cath only if there are sizeable areas of reversible ischemia. LDL goal less than 70.  - Start ASA 81mg  when okay from GI stand point.  - Unable to add BB due to soft BP - No ACE/ARB/ANRI due to renal function  - BNP 2366 today>> on daily lasix 20mg . Diuresed 1.5L in last 24 hours  2.  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  - Appreciate nephrology recomendations  7. Hypokalemia -resolved    For questions or updates, please contact [place] Please consult [email or web address] for contact info under        Signed, Leanor Kail, PA  10/10/2019, 9:28 AM    I have seen and examined the patient along with Leanor Kail, PA .  I have reviewed the chart, notes and new data.  I agree with PA/NP's note.  Key new complaints: Not particularly interactive.  Answers just about every question with "yes?" Key examination changes: Overt signs of hypervolemia/heart failure Key new findings / data: Mildly hyponatremic with sodium 148 potassium 4.8, creatinine stable at 3.05, BUN is lower than yesterday, BNP elevated at 2366, but no previous level for comparison. Brain MRI shows multiple old basal ganglia small vessel infarcts as well as generalized atrophy.  PLAN: I suspect that we may have stumbled across the sequelae of longstanding chronic CAD with  previous infarctions that were not detected.  I do not think she has had a recent myocardial infarction.  The treatment of her heart failure is very limited due to low blood pressure (she is receiving fludrocortisone and midodrine for blood pressure support).  Clearly not a candidate for beta-blockers or RAAS inhibitors.  No overt evidence of hypervolemia/heart failure at this time.  She is not a candidate for cardiac catheterization or PCI due to renal insufficiency and recent acute GI bleeding. In addition, the benefits of aggressive therapy appear questionable due to her multiple comorbid conditions and evidence of advanced ischemic cerebral atrophy.  [facility] will sign off.   Medication Recommendations:  Continue statin.  Add aspirin when deemed safe from GI standpoint.  Other recommendations (labs, testing, etc):  n/a Follow up as an outpatient:   Please contact us when she is approaching time for discharge.    Sanda Klein, MD, [place] [phone] 10/10/2019, 11:48 AM

## 2019-10-10 NOTE — Progress Notes (Signed)
Despite changing diet per SLP eval, pt is still severely pocketing food and refuses to chew. I have attempted small bites of soft food with thin liquids in between with no success. Pt will pocket, not chew and then spit out food until its liquid or pureed like applesauce.

## 2019-10-10 NOTE — Progress Notes (Signed)
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  Reportedly she is alert and oriented x 4 at base line and was willing to transfer back to assisted living from nursing home in near future.  In the emergency room, temperature 100.1.  Heart rate 114, respiratory 24, blood pressure 86/61.  On room air.  WBC count 14,000, procalcitonin 46, lactic acid 4.  Chest x-ray patchy bibasilar opacities, left greater than right.  CT abdomen pelvis with bilateral hydronephrosis with pyelonephritis and bladder distention.  Resuscitated, started on sepsis protocol and admitted to stepdown unit. 1/29: Mental status improving.  Started having maroon-colored stool with drop in hemoglobin, had EGD done showed non bleeding duodenal ulcer.  1/30: Mental status both are improving.  Renal functions fluctuate.  Foley functioning well and adequate urine output.  10/10/19: Seen and examined.  Minimally interactive.  Has no new complaints.    Assessment/Plan: Principal Problem:   Severe sepsis (HCC) Active Problems:   Renal failure (ARF), acute on chronic (HCC)   Pressure injury of skin   Malnutrition of moderate degree   Lactic acidosis   Acute pyelonephritis   Hypotension   Hypokalemia   Chronic indwelling Foley catheter   Sinus tachycardia   Bacteremia due to Enterococcus   Bacteremia due to Gram-negative bacteria  Severe sepsis secondary to polymicrobial bacteremia likely secondary to a urinary source Presented with leukocytosis, tachycardia, tachypnea with positive urine analysis, urine culture, and blood cultures with significantly elevated  procalcitonin 68 which is trending down to 6. Blood cultures drawn on 10/01/2019 + for Proteus mirabilis, Providencia stuartii, Enterococcus faecalis.  Repeat blood cultures done on 10/03/2019 negative final. Seen by infectious disease with recommendation for 14 days of Zosyn. Day #10 of antibiotics. Afebrile however leukocytosis is worsening>> 32K. Hypotensive, midodrine 2.5 mg 3 times daily to maintain MAP greater than 65 Infectious disease following Procalcitonin and lactic acid improving  Polymicrobial bacteremia 2D echo was obtained in the setting of Enterococcus bacteremia, did not show any evidence of vegetation. Infectious disease following and made recommendations as stated above.  Resolving Hypotension in the setting of sepsis Maintain MAP greater than 65 Continue midodrine 2.5 mg 3 times daily  Acute metabolic encephalopathy in the setting of acute illness, previous CVA, and suspected underlying dementia CT head done on 10/03/2019 showed no acute intracranial abnormality. 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   Per medical record no prior history of dementia or CVA MRI brain showed Multiple old basal ganglia small vessel infarcts. Generalized atrophy and severe chronic small vessel ischemic microangiopathy. Continue to reorient as needed.  Continue fall precautions.  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 Continue to avoid nephrotoxins and hypotension Monitor urine output; 905 cc recorded in the last 24H. Daily BMPs Consult nephrology.  Hypokalemia, likely contributed by bicarb Replete as indicated  Steroid-induced hyperglycemia with labile blood sugars/hypoglycemia A1c 6.4 on 10/08/2019 Hold off insulin coverage  due to hypoglycemia with serum blood glucose 48  on 10/09/2019.  Hypoglycemia corrected with D50 x 1  Chronic urinary retention/complex cyst right kidney Complex cyst in the upper pole of the right kidney stable from previous ultrasounds. Foley catheter in place Will need to follow-up with urology  Resolved anion gap metabolic acidosis Improved on bicarb replacement. Chemistry bicarb 26 and anion gap of 10.  History of previous CVA, seen on CT head Continue pravastatin Hold ASA and restart in 2 weeks per GI (Dr. Watt Climes 10/10/2019) LDL 95, goal less than 70 on 10/08/2019. Hemoglobin A1c 6.4 on 10/08/2019. Sinus tachycardia on last EKG Monitor on telemetry  Newly diagnosed systolic CHF 2D echo done to rule out endocarditis revealed LVEF 35 to 40% with severe left ventricular hypokinesis Does not appear to follow with cardiology outpatient Seen by cardiology.  Signed off. Contact cardiology prior to dc. Net I&O +13.5 L>> +12.1L Continue to monitor volume status Continue strict I's and O's and daily weight Heart failure medications on hold due to hypotension.  Prolonged QTC Avoid prolonging QTC agents Repeat twelve-lead EKG.  Chronic anxiety/depression Continue home medication   Adrenal insufficiency Continue Florinef and midodrine  Acute upper GI bleeding: Suspected upper GI bleeding with multiple episodes of maroon-colored stool.   Positive FOBT on 10/06/2019. She was also on steroids. Hemoglobin dropped from 9.6-8-7.5. Continue p.o. Protonix 40 mg twice daily x6 weeks then daily per GI. Follow results for H. Pylori. Hg stable 8.3  Acute blood loss anemia secondary to suspected upper GI bleed Management as stated above.  Physical debility PT assessment recommended SNF. TOC consulted to assist with SNF placement. Continue PT OT with assistance and fall precautions   DVT prophylaxis: SCDs Code Status: Full code  Consults: Cardiology, nephrology on 10/09/2019.  Family  Communication:  Updated her son [name]   Disposition Plan: patient is from skilled nursing facility. Anticipated DC to skilled nursing facility, Barriers to discharge active treatment for severe sepsis.       Objective: Vitals:   10/10/19 0900 10/10/19 1000 10/10/19 1100 10/10/19 1200  BP: 110/70 (!) 96/54 (!) 93/46 (!) 88/47  Pulse: 91 (!) 101 98 (!) 106  Resp: (!) 8 (!) 26 (!) 9 (!) 22  Temp:      TempSrc:      SpO2: 98% 97% 97% 97%  Weight:      Height:        Intake/Output Summary (Last 24 hours) at 10/10/2019 1429 Last data filed at 10/10/2019 0933 Gross per 24 hour  Intake 508.37 ml  Output 1850 ml  Net -1341.63 ml   Filed Weights   10/01/19 1805 10/09/19 0600 10/10/19 0500  Weight: 67.7 kg 82 kg 80.1 kg    Exam:  . General: 79 y.o. year-old female had a rupture nourished in no acute distress.  Minimally interactive.  Hard of hearing.   . Cardiovascular: Regular rate and rhythm no rubs or gallops.   Marland Kitchen Respiratory: Clear to Auscultation with No Wheezes or Rales.  \ . Abdomen: Obese bowel sounds present. Musculoskeletal: Trace lower extremity edema bilaterally.   Psychiatry: Mood is appropriate for condition and setting.  Data Reviewed: CBC: Recent Labs  Lab 10/08/19 0532 10/08/19 1657 10/09/19 0517 10/09/19 1636 10/10/19 0534  WBC 20.7* 24.5* 29.9* 30.5* 32.2*  NEUTROABS 16.4* 18.3* 22.0* 24.8* 27.4*  HGB 7.5* 7.7* 8.0* 8.5* 8.3*  HCT 25.0* 24.9* 25.9* 27.1* 27.3*  MCV 93.3 92.2 90.9 90.6 92.9  PLT 192 194 213 215 [id number]   Basic Metabolic Panel: Recent Labs  Lab  10/06/19 [id number] 10/06/19 [id number] 10/07/19 [id number] 10/07/19 2102 10/08/19 0532 10/09/19 0517 10/09/19 1711 10/10/19 0534  NA 146*  --  143  --  135 138  --  148*  K 4.7   < > 4.2  --  3.9 3.1* 3.9 4.8  CL 117*  --  115*  --  102 102  --  110  CO2 20*  --  18*  --  20* 26  --  22  GLUCOSE 256*  --  229*  --  378* 48*  --  142*  BUN 82*   < > 87* 84* 80* 78*  --  62*  CREATININE 2.99*  --   3.10*  --  3.05* 3.16*  --  3.05*  CALCIUM 8.1*  --  7.9*  --  7.5* 7.4*  --  7.0*  MG 1.9  --  2.0  --  1.8  --   --   --   PHOS 3.3  --  3.4  --  3.3  --   --   --    < > = values in this interval not displayed.   GFR: Estimated Creatinine Clearance: 16.5 mL/min (A) (by C-G formula based on SCr of 3.05 mg/dL (H)). Liver Function Tests: Recent Labs  Lab 10/10/19 0534  AST 20  ALT 28  ALKPHOS 46  BILITOT 0.5  PROT 4.2*  ALBUMIN 1.6*   No results for input(s): LIPASE, AMYLASE in the last 168 hours. No results for input(s): AMMONIA in the last 168 hours. Coagulation Profile: Recent Labs  Lab 10/07/19 0412  INR 1.5*   Cardiac Enzymes: No results for input(s): CKTOTAL, CKMB, CKMBINDEX, TROPONINI in the last 168 hours. BNP (last 3 results) No results for input(s): PROBNP in the last 8760 hours. HbA1C: Recent Labs    10/08/19 0532  HGBA1C 6.4*   CBG: Recent Labs  Lab 10/09/19 1138 10/09/19 1708 10/09/19 2054 10/10/19 0814 10/10/19 1117  GLUCAP 101* 92 105* 152* 207*   Lipid Profile: Recent Labs    10/08/19 0524  CHOL 166  HDL 28*  LDLCALC 95  TRIG 217*  CHOLHDL 5.9   Thyroid Function Tests: No results for input(s): TSH, T4TOTAL, FREET4, T3FREE, THYROIDAB in the last 72 hours. Anemia Panel: No results for input(s): VITAMINB12, FOLATE, FERRITIN, TIBC, IRON, RETICCTPCT in the last 72 hours. Urine analysis:    Component Value Date/Time   COLORURINE YELLOW 10/09/2019 1403   APPEARANCEUR TURBID (A) 10/09/2019 1403   LABSPEC 1.008 10/09/2019 1403   PHURINE 5.0 10/09/2019 1403   GLUCOSEU NEGATIVE 10/09/2019 1403   HGBUR MODERATE (A) 10/09/2019 1403   BILIRUBINUR NEGATIVE 10/09/2019 1403   KETONESUR NEGATIVE 10/09/2019 1403   PROTEINUR NEGATIVE 10/09/2019 1403   UROBILINOGEN 0.2 03/03/2010 0808   NITRITE NEGATIVE 10/09/2019 1403   LEUKOCYTESUR LARGE (A) 10/09/2019 1403   Sepsis Labs: @LABRCNTIP (procalcitonin:4,lacticidven:4)  ) Recent Results (from  the past 240 hour(s))  Blood Culture (routine x 2)     Status: Abnormal   Collection Time: 10/01/19 12:12 PM   Specimen: BLOOD  Result Value Ref Range Status   Specimen Description   Final    BLOOD RIGHT WRIST Performed at [place] [address], [place], [place] [address]    Special Requests   Final    BOTTLES DRAWN AEROBIC AND ANAEROBIC Blood Culture results may not be optimal due to an inadequate volume of blood received in culture bottles Performed at [place] [address] [place],  [place] [address]    Culture  Setup Time   Final    GRAM POSITIVE COCCI GRAM NEGATIVE RODS CRITICAL VALUE NOTED.  VALUE IS CONSISTENT WITH PREVIOUSLY REPORTED AND CALLED VALUE.    Culture (A)  Final    PROTEUS MIRABILIS PROVIDENCIA STUARTII ENTEROCOCCUS FAECALIS SUSCEPTIBILITIES PERFORMED ON PREVIOUS CULTURE WITHIN THE LAST 5 DAYS. Performed at [facility], [place] [address], [place], [place] [address]    Report Status 10/08/2019 FINAL  Final   Organism ID, Bacteria ENTEROCOCCUS FAECALIS  Final      Susceptibility   Enterococcus faecalis - MIC*    AMPICILLIN <=2 SENSITIVE Sensitive     VANCOMYCIN 2 SENSITIVE Sensitive     GENTAMICIN SYNERGY SENSITIVE Sensitive     * ENTEROCOCCUS FAECALIS  Blood Culture (routine x 2)     Status: Abnormal   Collection Time: 10/01/19 12:13 PM   Specimen: BLOOD LEFT FOREARM  Result Value Ref Range Status   Specimen Description   Final    BLOOD LEFT FOREARM Performed at [place] [address], [place], [place] [address]    Special Requests   Final    BOTTLES DRAWN AEROBIC AND ANAEROBIC Blood Culture results may not be optimal due to an inadequate volume of blood received in culture bottles Performed at [place] [address] [place], [place] [address]    Culture  Setup Time   Final    GRAM POSITIVE COCCI GRAM NEGATIVE RODS IN BOTH AEROBIC AND ANAEROBIC  BOTTLES CRITICAL RESULT CALLED TO, READ BACK BY AND VERIFIED WITH: PHARMD A. PHAM 0840 [id number] FCP    Culture (A)  Final    PROTEUS MIRABILIS PROVIDENCIA STUARTII ENTEROCOCCUS FAECALIS SUSCEPTIBILITIES PERFORMED ON PREVIOUS CULTURE WITHIN THE LAST 5 DAYS. Performed at [facility], [place] [address] [place], [place] [address]    Report Status 10/08/2019 FINAL  Final   Organism ID, Bacteria PROTEUS MIRABILIS  Final   Organism ID, Bacteria PROVIDENCIA STUARTII  Final      Susceptibility   Proteus mirabilis - MIC*    AMPICILLIN <=2 SENSITIVE Sensitive     CEFAZOLIN <=4 SENSITIVE Sensitive     CEFEPIME <=0.12 SENSITIVE Sensitive     CEFTAZIDIME <=1 SENSITIVE Sensitive     CEFTRIAXONE <=0.25 SENSITIVE Sensitive     CIPROFLOXACIN 1 SENSITIVE Sensitive     GENTAMICIN <=1 SENSITIVE Sensitive     IMIPENEM 4 SENSITIVE Sensitive     TRIMETH/SULFA <=20 SENSITIVE Sensitive     AMPICILLIN/SULBACTAM <=2 SENSITIVE Sensitive     PIP/TAZO <=4 SENSITIVE Sensitive     * PROTEUS MIRABILIS   Providencia stuartii - MIC*    AMPICILLIN >=32 RESISTANT Resistant     CEFAZOLIN >=64 RESISTANT Resistant     CEFEPIME <=0.12 SENSITIVE Sensitive     CEFTAZIDIME <=1 SENSITIVE Sensitive     CEFTRIAXONE <=0.25 SENSITIVE Sensitive     CIPROFLOXACIN >=4 RESISTANT Resistant     GENTAMICIN RESISTANT Resistant     IMIPENEM 1 SENSITIVE Sensitive     TRIMETH/SULFA >=320 RESISTANT Resistant     AMPICILLIN/SULBACTAM >=32 RESISTANT Resistant     PIP/TAZO <=4 SENSITIVE Sensitive     * PROVIDENCIA STUARTII  Blood Culture ID Panel (Reflexed)     Status: Abnormal   Collection Time: 10/01/19 12:13 PM  Result Value Ref Range Status   Enterococcus species DETECTED (A) NOT DETECTED Final    Comment: CRITICAL RESULT CALLED TO, READ BACK BY AND VERIFIED WITH: PHARMD A.  PHAM [id number] FCP    Vancomycin resistance NOT DETECTED NOT DETECTED Final   Listeria monocytogenes NOT DETECTED NOT DETECTED Final   Staphylococcus  species NOT DETECTED NOT DETECTED Final   Staphylococcus aureus (BCID) NOT DETECTED NOT DETECTED Final   Streptococcus species NOT DETECTED NOT DETECTED Final   Streptococcus agalactiae NOT DETECTED NOT DETECTED Final   Streptococcus pneumoniae NOT DETECTED NOT DETECTED Final   Streptococcus pyogenes NOT DETECTED NOT DETECTED Final   Acinetobacter baumannii NOT DETECTED NOT DETECTED Final   Enterobacteriaceae species DETECTED (A) NOT DETECTED Final    Comment: Enterobacteriaceae represent a large family of gram-negative bacteria, not a single organism. CRITICAL RESULT CALLED TO, READ BACK BY AND VERIFIED WITH: PHARMD A. PHAM [id number] FCP    Enterobacter cloacae complex NOT DETECTED NOT DETECTED Final   Escherichia coli NOT DETECTED NOT DETECTED Final   Klebsiella oxytoca NOT DETECTED NOT DETECTED Final   Klebsiella pneumoniae NOT DETECTED NOT DETECTED Final   Proteus species DETECTED (A) NOT DETECTED Final    Comment: CRITICAL RESULT CALLED TO, READ BACK BY AND VERIFIED WITH: PHARMD A. PHAM [id number] FCP    Serratia marcescens NOT DETECTED NOT DETECTED Final   Carbapenem resistance NOT DETECTED NOT DETECTED Final   Haemophilus influenzae NOT DETECTED NOT DETECTED Final   Neisseria meningitidis NOT DETECTED NOT DETECTED Final   Pseudomonas aeruginosa NOT DETECTED NOT DETECTED Final   Candida albicans NOT DETECTED NOT DETECTED Final   Candida glabrata NOT DETECTED NOT DETECTED Final   Candida krusei NOT DETECTED NOT DETECTED Final   Candida parapsilosis NOT DETECTED NOT DETECTED Final   Candida tropicalis NOT DETECTED NOT DETECTED Final    Comment: Performed at [facility], [place] [address] [place], [place] [address]  Urine culture     Status: Abnormal   Collection Time: 10/01/19  2:27 PM   Specimen: In/Out Cath Urine  Result Value Ref Range Status   Specimen Description   Final    IN/OUT CATH URINE Performed at [place] [address]  [place], [place] [address]    Special Requests   Final    NONE Performed at [facility], [place] [address] [place],  [address]    Culture (A)  Final    >=100,000 COLONIES/mL PROVIDENCIA STUARTII >=100,000 COLONIES/mL PROTEUS MIRABILIS    Report Status 10/05/2019 FINAL  Final   Organism ID, Bacteria PROVIDENCIA STUARTII (A)  Final   Organism ID, Bacteria PROTEUS MIRABILIS (A)  Final      Susceptibility   Proteus mirabilis - MIC*    AMPICILLIN <=2 SENSITIVE Sensitive     CEFAZOLIN <=4 SENSITIVE Sensitive     CEFTRIAXONE <=0.25 SENSITIVE Sensitive     CIPROFLOXACIN 1 SENSITIVE Sensitive     GENTAMICIN <=1 SENSITIVE Sensitive     IMIPENEM 8 INTERMEDIATE Intermediate     NITROFURANTOIN 256 RESISTANT Resistant     TRIMETH/SULFA <=20 SENSITIVE Sensitive     AMPICILLIN/SULBACTAM <=2 SENSITIVE Sensitive     PIP/TAZO <=4 SENSITIVE Sensitive     * >=100,000 COLONIES/mL PROTEUS MIRABILIS   Providencia stuartii - MIC*    AMPICILLIN >=32 RESISTANT Resistant     CEFAZOLIN >=64 RESISTANT Resistant     CEFTRIAXONE <=0.25 SENSITIVE Sensitive     CIPROFLOXACIN >=4 RESISTANT Resistant     GENTAMICIN RESISTANT Resistant     IMIPENEM 2 SENSITIVE Sensitive     NITROFURANTOIN 128 RESISTANT Resistant     TRIMETH/SULFA >=320 RESISTANT Resistant  AMPICILLIN/SULBACTAM >=32 RESISTANT Resistant     PIP/TAZO <=4 SENSITIVE Sensitive     * >=100,000 COLONIES/mL PROVIDENCIA STUARTII  Respiratory Panel by RT PCR (Flu A&B, Covid) - Urine, Catheterized     Status: None   Collection Time: 10/01/19  2:27 PM   Specimen: Urine, Catheterized  Result Value Ref Range Status   SARS Coronavirus 2 by RT PCR NEGATIVE NEGATIVE Final    Comment: (NOTE) SARS-CoV-2 target nucleic acids are NOT DETECTED. The SARS-CoV-2 RNA is generally detectable in upper respiratoy specimens during the acute phase of infection. The lowest concentration of SARS-CoV-2 viral copies this assay can detect is 131  copies/mL. A negative result does not preclude SARS-Cov-2 infection and should not be used as the sole basis for treatment or other patient management decisions. A negative result may occur with  improper specimen collection/handling, submission of specimen other than nasopharyngeal swab, presence of viral mutation(s) within the areas targeted by this assay, and inadequate number of viral copies (<131 copies/mL). A negative result must be combined with clinical observations, patient history, and epidemiological information. The expected result is Negative. Fact Sheet for Patients:  PinkCheek.be Fact Sheet for Healthcare Providers:  GravelBags.it This test is not yet ap proved or cleared by the [place] FDA and  has been authorized for detection and/or diagnosis of SARS-CoV-2 by FDA under an Emergency Use Authorization (EUA). This EUA will remain  in effect (meaning this test can be used) for the duration of the COVID-19 declaration under Section 564(b)(1) of the Act, 21 U.S.C. section 360bbb-3(b)(1), unless the authorization is terminated or revoked sooner.    Influenza A by PCR NEGATIVE NEGATIVE Final   Influenza B by PCR NEGATIVE NEGATIVE Final    Comment: (NOTE) The Xpert Xpress SARS-CoV-2/FLU/RSV assay is intended as an aid in  the diagnosis of influenza from Nasopharyngeal swab specimens and  should not be used as a sole basis for treatment. Nasal washings and  aspirates are unacceptable for Xpert Xpress SARS-CoV-2/FLU/RSV  testing. Fact Sheet for Patients: PinkCheek.be Fact Sheet for Healthcare Providers: GravelBags.it This test is not yet approved or cleared by the [place] FDA and  has been authorized for detection and/or diagnosis of SARS-CoV-2 by  FDA under an Emergency Use Authorization (EUA). This EUA will remain  in effect (meaning this test can  be used) for the duration of the  Covid-19 declaration under Section 564(b)(1) of the Act, 21  U.S.C. section 360bbb-3(b)(1), unless the authorization is  terminated or revoked. Performed at [facility], [place] [address], [place], [place] [address]   MRSA PCR Screening     Status: None   Collection Time: 10/01/19  6:28 PM   Specimen: Nasopharyngeal  Result Value Ref Range Status   MRSA by PCR NEGATIVE NEGATIVE Final    Comment:        The GeneXpert MRSA Assay (FDA approved for NASAL specimens only), is one component of a comprehensive MRSA colonization surveillance program. It is not intended to diagnose MRSA infection nor to guide or monitor treatment for MRSA infections. Performed at [facility], [place] [address], [place], [place] [address]   Culture, blood (routine x 2)     Status: None   Collection Time: 10/03/19 12:49 AM   Specimen: BLOOD  Result Value Ref Range Status   Specimen Description   Final    BLOOD RIGHT ARM Performed at [place] [address], [place], [place] [address]    Special  Requests   Final    BOTTLES DRAWN AEROBIC ONLY Blood Culture adequate volume Performed at [place] [address], [place], [place] [address]    Culture   Final    NO GROWTH 5 DAYS Performed at [facility], [place] [address] [place], [place] [address]    Report Status 10/08/2019 FINAL  Final  Culture, blood (routine x 2)     Status: None   Collection Time: 10/03/19 12:49 AM   Specimen: BLOOD  Result Value Ref Range Status   Specimen Description   Final    BLOOD RIGHT HAND Performed at [place] [address] [place], [place] [address]    Special Requests   Final    BOTTLES DRAWN AEROBIC ONLY Blood Culture adequate volume Performed at [place] [address] [place], [place] [address]    Culture   Final    NO GROWTH 5 DAYS Performed  at [facility], [place] [address] [place], [place] [address]    Report Status 10/08/2019 FINAL  Final      Studies: MR BRAIN WO CONTRAST  Result Date: 10/09/2019 CLINICAL DATA:  Encephalopathy EXAM: MRI HEAD WITHOUT CONTRAST TECHNIQUE: Multiplanar, multiecho pulse sequences of the brain and surrounding structures were obtained without intravenous contrast. COMPARISON:  Head CT 10/03/2019 FINDINGS: BRAIN: No acute infarct, acute hemorrhage or extra-axial collection. Diffuse confluent hyperintense T2-weighted signal within the periventricular, deep and juxtacortical white matter, most commonly due to chronic ischemic microangiopathy. There is generalized atrophy without lobar predilection. Midline structures are normal. There are multiple old supratentorial small vessel infarcts VASCULAR: Major flow voids are preserved. Susceptibility-sensitive sequences show no chronic microhemorrhage or superficial siderosis. SKULL AND UPPER CERVICAL SPINE: Normal calvarium and skull base. Visualized upper cervical spine and soft tissues are normal. SINUSES/ORBITS: No fluid levels or advanced mucosal thickening. No mastoid or middle ear effusion. Normal orbits. IMPRESSION: 1. No acute intracranial process. 2. 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 3. Generalized atrophy and severe chronic small vessel ischemic microangiopathy. Electronically Signed   By: Ulyses Jarred M.D.   On: 10/09/2019 22:30    Scheduled Meds: . aspirin EC  81 mg Oral Daily  . Chlorhexidine Gluconate Cloth  6 each Topical Daily  . DULoxetine  20 mg Oral Daily  . feeding supplement (ENSURE ENLIVE)  237 mL Oral 4x daily  . fludrocortisone  0.1 mg Oral Daily  . furosemide  20 mg Oral Daily  . insulin aspart  0-5 Units Subcutaneous QHS  . insulin aspart  0-9 Units Subcutaneous TID WC  . insulin aspart  3 Units Subcutaneous TID WC  . insulin glargine  4 Units Subcutaneous QHS  . mouth rinse  15 mL Mouth Rinse BID  . midodrine   2.5 mg Oral TID WC  . pantoprazole  40 mg Oral BID AC  . pravastatin  40 mg Oral QHS    Continuous Infusions: . piperacillin-tazobactam (ZOSYN)  IV 2.25 g (10/10/19 1334)     LOS: 9 days     Kayleen Memos, MD [facility] Pager [phone]  If 7PM-7AM, please contact night-coverage [email or web address] Password [facility] 10/10/2019, 2:29 PM

## 2019-10-10 NOTE — Progress Notes (Signed)
NUTRITION NOTE  RN reports that patient is pocketing solid foods but does well with liquids. She reports plan for SLP evaluation, but requests order for Ensure in the interim. Will order Ensure Enlive QID, each supplement provides 350 kcal and 20 grams protein.     Jarome Matin, MS, RD, LDN, [facility] Inpatient Clinical Dietitian Pager # [phone] After hours/weekend pager # [phone]

## 2019-10-10 NOTE — Progress Notes (Signed)
[facility] Progress Note    Assessment/Plan **AKI on CKD:  Baseline renal function Cr 1.5 -2 and now ~3 in setting of sepsis and obstruction -- from what I can tell foley was in place on presentation but possibly obstructed with hydro noted on initial CT; Foley exchanged. F/u Renal US showed improved hydronephrosis.  BPs have been borderline and lactate elevated on presentation so at least has some ATN.  No contrast exposure, no NSAIDs.  No rashes or fevers to suggest AIN but time course raises the question; UA without WBC casts noted - I am OK to cont zosyn for now.   At this point would just continue supportive care --> IV fluids/diuretics PRN (currently gently diuresing on day to day basis) to maintain euvolemia, avoid nephrotoxins.  Will just need time for recovery and may not achieve prior baseline.    **Sepsis secondary to polymicrobial bacteremia and UTI:  Antibiotics per ID -- planning 14 d course zosyn.  Worsening leukocytosis with neutrophilic predominance but afebrile still.  Per primary.   **HFrEF:  Cardiology following.  No BB and RAAS inhibition with low BP and CKD.  Certainly edematous but it's extravascular so will need to proceed cautiously diuresing.  Lasix 40 IV x 1 again today and will determine dose daily.  Low na diet, daily weights.   **ABLA secondary to presumed GIB:  Hb initially 10.1 now in the 8s.  No IV iron in setting of bacteremia.  CTM, may dose with ESA prior to discharge pending course.   **Hypokalemia: supplement PRN, now 4.8.   **Adrenal insufficiency:  On florinef and midodrine.  In future would consider stress dose steroids if septic.   **h/o CVA: per primary.   **R complex cyst: stable, CTM outpt.   [name] MD [facility] Pager: [phone]  ________________________________________________________________________  Subjective:   C/o leg pain from immobility.  No other complaints.  I/Os yest 270 /  1850  Objective Vitals:   10/10/19 0600 10/10/19 0700 10/10/19 0800 10/10/19 0900  BP: (!) 104/58 104/64 103/72 110/70  Pulse: 92 96 95 91  Resp: (!) 8 (!) 23 19 (!) 8  Temp:   97.7 F (36.5 C)   TempSrc:   Oral   SpO2: 99% 99% 98% 98%  Weight:      Height:       Physical Exam General: elderly woman at 45 degrees, comfortable Heart: RRR Lungs: clear Abdomen: soft Extremities: 1+ diffuse edema   Additional Objective Labs: Basic Metabolic Panel: Recent Labs  Lab 10/06/19 0238 10/06/19 0238 10/07/19 0412 10/07/19 2102 10/08/19 0532 10/08/19 0532 10/09/19 0517 10/09/19 1711 10/10/19 0534  NA 146*   < > 143  --  135  --  138  --  148*  K 4.7   < > 4.2  --  3.9   < > 3.1* 3.9 4.8  CL 117*   < > 115*  --  102  --  102  --  110  CO2 20*   < > 18*  --  20*  --  26  --  22  GLUCOSE 256*   < > 229*  --  378*  --  48*  --  142*  BUN 82*   < > 87*   < > 80*  --  78*  --  62*  CREATININE 2.99*   < > 3.10*  --  3.05*  --  3.16*  --  3.05*  CALCIUM 8.1*   < >  7.9*  --  7.5*  --  7.4*  --  7.0*  PHOS 3.3  --  3.4  --  3.3  --   --   --   --    < > = values in this interval not displayed.   Liver Function Tests: Recent Labs  Lab 10/10/19 0534  AST 20  ALT 28  ALKPHOS 46  BILITOT 0.5  PROT 4.2*  ALBUMIN 1.6*   No results for input(s): LIPASE, AMYLASE in the last 168 hours. CBC: Recent Labs  Lab 10/08/19 0532 10/08/19 0532 10/08/19 1657 10/08/19 1657 10/09/19 0517 10/09/19 1636 10/10/19 0534  WBC 20.7*   < > 24.5*   < > 29.9* 30.5* 32.2*  NEUTROABS 16.4*   < > 18.3*   < > 22.0* 24.8* 27.4*  HGB 7.5*   < > 7.7*   < > 8.0* 8.5* 8.3*  HCT 25.0*   < > 24.9*   < > 25.9* 27.1* 27.3*  MCV 93.3  --  92.2  --  90.9 90.6 92.9  PLT 192   < > 194   < > 213 215 235   < > = values in this interval not displayed.   Blood Culture    Component Value Date/Time   SDES  10/03/2019 0049    BLOOD RIGHT ARM Performed at [facility], [place] [address]  [place], [place] [address]    SDES  10/03/2019 [phone]    BLOOD RIGHT HAND Performed at [facility], [place] [address] [place], [place] [address]    SPECREQUEST  10/03/2019 0049    BOTTLES DRAWN AEROBIC ONLY Blood Culture adequate volume Performed at [facility] [address] [place] [address], [place], [place] [address]    SPECREQUEST  10/03/2019 0049    BOTTLES DRAWN AEROBIC ONLY Blood Culture adequate volume Performed at [facility] [address] [place], [place] [address]    CULT  10/03/2019 0049    NO GROWTH 5 DAYS Performed at [facility], [place] [address], [place], [place] [address]    CULT  10/03/2019 0049    NO GROWTH 5 DAYS Performed at [facility], [place] [address] [place], [place] [address]    REPTSTATUS 10/08/2019 FINAL 10/03/2019 0049   REPTSTATUS 10/08/2019 FINAL 10/03/2019 0049    Cardiac Enzymes: No results for input(s): CKTOTAL, CKMB, CKMBINDEX, TROPONINI in the last 168 hours. CBG: Recent Labs  Lab 10/09/19 0933 10/09/19 1138 10/09/19 1708 10/09/19 2054 10/10/19 0814  GLUCAP 74 101* 92 105* 152*   Iron Studies: No results for input(s): IRON, TIBC, TRANSFERRIN, FERRITIN in the last 72 hours. @lablastinr3 @ Studies/Results: MR BRAIN WO CONTRAST  Result Date: 10/09/2019 CLINICAL DATA:  Encephalopathy EXAM: MRI HEAD WITHOUT CONTRAST TECHNIQUE: Multiplanar, multiecho pulse sequences of the brain and surrounding structures were obtained without intravenous contrast. COMPARISON:  Head CT 10/03/2019 FINDINGS: BRAIN: No acute infarct, acute hemorrhage or extra-axial collection. Diffuse confluent hyperintense T2-weighted signal within the periventricular, deep and juxtacortical white matter, most commonly due to chronic ischemic microangiopathy. There is generalized atrophy without lobar predilection. Midline structures are normal. There are multiple old supratentorial small vessel infarcts VASCULAR: Major flow voids are  preserved. Susceptibility-sensitive sequences show no chronic microhemorrhage or superficial siderosis. SKULL AND UPPER CERVICAL SPINE: Normal calvarium and skull base. Visualized upper cervical spine and soft tissues are normal. SINUSES/ORBITS: No fluid levels or advanced mucosal thickening. No mastoid or middle ear effusion. Normal orbits. IMPRESSION: 1. No  acute intracranial process. 2. 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 3. Generalized atrophy and severe chronic small vessel ischemic microangiopathy. Electronically Signed   By: Ulyses Jarred M.D.   On: 10/09/2019 22:30   Medications: . piperacillin-tazobactam (ZOSYN)  IV Stopped (10/10/19 0722)   . aspirin EC  81 mg Oral Daily  . Chlorhexidine Gluconate Cloth  6 each Topical Daily  . DULoxetine  20 mg Oral Daily  . feeding supplement (ENSURE ENLIVE)  237 mL Oral 4x daily  . fludrocortisone  0.1 mg Oral Daily  . furosemide  20 mg Oral Daily  . insulin aspart  0-5 Units Subcutaneous QHS  . insulin aspart  0-9 Units Subcutaneous TID WC  . insulin aspart  3 Units Subcutaneous TID WC  . insulin glargine  4 Units Subcutaneous QHS  . mouth rinse  15 mL Mouth Rinse BID  . midodrine  2.5 mg Oral TID WC  . pantoprazole  40 mg Oral BID AC  . pravastatin  40 mg Oral QHS

## 2019-10-10 NOTE — Progress Notes (Signed)
Patient ID: KIABETH MCANANY, female   DOB: 06/27/1941, 79 y.o.   MRN: [id number]         [facility] for Infectious Disease  Date of Admission:  10/01/2019   Total days of antibiotics 10        Day 2 piperacillin tazobactam         ASSESSMENT: Despite worsening leukocytosis, she appears to have improved overall therapy for polymicrobial UTI and bacteremia.  Her fever and bilateral hydronephrosis have resolved.  Radiographs of her brain, chest and pelvis have not shown any any signs of infectious complications.  PLAN: 1. Continue piperacillin tazobactam for 4 more days  Principal Problem:   Severe sepsis (HCC) Active Problems:   Acute pyelonephritis   Bacteremia due to Enterococcus   Bacteremia due to Gram-negative bacteria   Renal failure (ARF), acute on chronic (HCC)   Pressure injury of skin   Malnutrition of moderate degree   Lactic acidosis   Hypotension   Hypokalemia   Chronic indwelling Foley catheter   Sinus tachycardia   Scheduled Meds: . aspirin EC  81 mg Oral Daily  . Chlorhexidine Gluconate Cloth  6 each Topical Daily  . DULoxetine  20 mg Oral Daily  . feeding supplement (ENSURE ENLIVE)  237 mL Oral 4x daily  . fludrocortisone  0.1 mg Oral Daily  . furosemide  20 mg Oral Daily  . insulin aspart  0-5 Units Subcutaneous QHS  . insulin aspart  0-9 Units Subcutaneous TID WC  . insulin aspart  3 Units Subcutaneous TID WC  . insulin glargine  4 Units Subcutaneous QHS  . mouth rinse  15 mL Mouth Rinse BID  . midodrine  2.5 mg Oral TID WC  . pantoprazole  40 mg Oral BID AC  . pravastatin  40 mg Oral QHS   Continuous Infusions: . piperacillin-tazobactam (ZOSYN)  IV Stopped (10/10/19 0722)   PRN Meds:.acetaminophen **OR** acetaminophen, bisacodyl, haloperidol lactate, metoprolol tartrate, ondansetron **OR** ondansetron (ZOFRAN) IV  Review of Systems: Review of Systems  Unable to perform ROS: Mental acuity    No Known Allergies  OBJECTIVE: Vitals:   10/10/19 0600 10/10/19 0700 10/10/19 0800 10/10/19 0900  BP: (!) 104/58 104/64 103/72 110/70  Pulse: 92 96 95 91  Resp: (!) 8 (!) 23 19 (!) 8  Temp:   97.7 F (36.5 C)   TempSrc:   Oral   SpO2: 99% 99% 98% 98%  Weight:      Height:       Body mass index is 28.5 kg/m.  Physical Exam Constitutional:      Comments: She is resting quietly in bed with her eyes open.  She does not respond to questions today.  Cardiovascular:     Rate and Rhythm: Normal rate and regular rhythm.     Heart sounds: No murmur.  Pulmonary:     Effort: Pulmonary effort is normal.     Breath sounds: Normal breath sounds.  Abdominal:     Palpations: Abdomen is soft.     Tenderness: There is no abdominal tenderness.  Musculoskeletal:        General: No swelling or tenderness.  Skin:    Findings: No rash.     Lab Results Lab Results  Component Value Date   WBC 32.2 (H) 10/10/2019   HGB 8.3 (L) 10/10/2019   HCT 27.3 (L) 10/10/2019   MCV 92.9 10/10/2019   PLT 235 10/10/2019    Lab Results  Component Value Date   CREATININE  3.05 (H) 10/10/2019   BUN 62 (H) 10/10/2019   NA 148 (H) 10/10/2019   K 4.8 10/10/2019   CL 110 10/10/2019   CO2 22 10/10/2019    Lab Results  Component Value Date   ALT 28 10/10/2019   AST 20 10/10/2019   ALKPHOS 46 10/10/2019   BILITOT 0.5 10/10/2019     Microbiology: Recent Results (from the past 240 hour(s))  Blood Culture (routine x 2)     Status: Abnormal   Collection Time: 10/01/19 12:12 PM   Specimen: BLOOD  Result Value Ref Range Status   Specimen Description   Final    BLOOD RIGHT WRIST Performed at [facility] [address], [place], [place] [address]    Special Requests   Final    BOTTLES DRAWN AEROBIC AND ANAEROBIC Blood Culture results may not be optimal due to an inadequate volume of blood received in culture bottles Performed at [facility] [address], [place], [place] [address]    Culture  Setup  Time   Final    GRAM POSITIVE COCCI GRAM NEGATIVE RODS CRITICAL VALUE NOTED.  VALUE IS CONSISTENT WITH PREVIOUSLY REPORTED AND CALLED VALUE.    Culture (A)  Final    PROTEUS MIRABILIS PROVIDENCIA STUARTII ENTEROCOCCUS FAECALIS SUSCEPTIBILITIES PERFORMED ON PREVIOUS CULTURE WITHIN THE LAST 5 DAYS. Performed at [facility], [place] [address] [place], [place] [address]    Report Status 10/08/2019 FINAL  Final   Organism ID, Bacteria ENTEROCOCCUS FAECALIS  Final      Susceptibility   Enterococcus faecalis - MIC*    AMPICILLIN <=2 SENSITIVE Sensitive     VANCOMYCIN 2 SENSITIVE Sensitive     GENTAMICIN SYNERGY SENSITIVE Sensitive     * ENTEROCOCCUS FAECALIS  Blood Culture (routine x 2)     Status: Abnormal   Collection Time: 10/01/19 12:13 PM   Specimen: BLOOD LEFT FOREARM  Result Value Ref Range Status   Specimen Description   Final    BLOOD LEFT FOREARM Performed at [facility] [address], [place], [place] [address]    Special Requests   Final    BOTTLES DRAWN AEROBIC AND ANAEROBIC Blood Culture results may not be optimal due to an inadequate volume of blood received in culture bottles Performed at [facility] [address], [place], [place] [address]    Culture  Setup Time   Final    GRAM POSITIVE COCCI GRAM NEGATIVE RODS IN BOTH AEROBIC AND ANAEROBIC BOTTLES CRITICAL RESULT CALLED TO, READ BACK BY AND VERIFIED WITH: PHARMD A. PHAM 0840 [id number] FCP    Culture (A)  Final    PROTEUS MIRABILIS PROVIDENCIA STUARTII ENTEROCOCCUS FAECALIS SUSCEPTIBILITIES PERFORMED ON PREVIOUS CULTURE WITHIN THE LAST 5 DAYS. Performed at [facility], [place] [address] [place],  [address]    Report Status 10/08/2019 FINAL  Final   Organism ID, Bacteria PROTEUS MIRABILIS  Final   Organism ID, Bacteria PROVIDENCIA STUARTII  Final      Susceptibility   Proteus mirabilis - MIC*    AMPICILLIN <=2 SENSITIVE Sensitive     CEFAZOLIN <=4  SENSITIVE Sensitive     CEFEPIME <=0.12 SENSITIVE Sensitive     CEFTAZIDIME <=1 SENSITIVE Sensitive     CEFTRIAXONE <=0.25 SENSITIVE Sensitive     CIPROFLOXACIN 1 SENSITIVE Sensitive     GENTAMICIN <=1 SENSITIVE Sensitive     IMIPENEM 4 SENSITIVE Sensitive     TRIMETH/SULFA <=20 SENSITIVE Sensitive  AMPICILLIN/SULBACTAM <=2 SENSITIVE Sensitive     PIP/TAZO <=4 SENSITIVE Sensitive     * PROTEUS MIRABILIS   Providencia stuartii - MIC*    AMPICILLIN >=32 RESISTANT Resistant     CEFAZOLIN >=64 RESISTANT Resistant     CEFEPIME <=0.12 SENSITIVE Sensitive     CEFTAZIDIME <=1 SENSITIVE Sensitive     CEFTRIAXONE <=0.25 SENSITIVE Sensitive     CIPROFLOXACIN >=4 RESISTANT Resistant     GENTAMICIN RESISTANT Resistant     IMIPENEM 1 SENSITIVE Sensitive     TRIMETH/SULFA >=320 RESISTANT Resistant     AMPICILLIN/SULBACTAM >=32 RESISTANT Resistant     PIP/TAZO <=4 SENSITIVE Sensitive     * PROVIDENCIA STUARTII  Blood Culture ID Panel (Reflexed)     Status: Abnormal   Collection Time: 10/01/19 12:13 PM  Result Value Ref Range Status   Enterococcus species DETECTED (A) NOT DETECTED Final    Comment: CRITICAL RESULT CALLED TO, READ BACK BY AND VERIFIED WITH: PHARMD A. PHAM [id number] FCP    Vancomycin resistance NOT DETECTED NOT DETECTED Final   Listeria monocytogenes NOT DETECTED NOT DETECTED Final   Staphylococcus species NOT DETECTED NOT DETECTED Final   Staphylococcus aureus (BCID) NOT DETECTED NOT DETECTED Final   Streptococcus species NOT DETECTED NOT DETECTED Final   Streptococcus agalactiae NOT DETECTED NOT DETECTED Final   Streptococcus pneumoniae NOT DETECTED NOT DETECTED Final   Streptococcus pyogenes NOT DETECTED NOT DETECTED Final   Acinetobacter baumannii NOT DETECTED NOT DETECTED Final   Enterobacteriaceae species DETECTED (A) NOT DETECTED Final    Comment: Enterobacteriaceae represent a large family of gram-negative bacteria, not a single organism. CRITICAL RESULT CALLED  TO, READ BACK BY AND VERIFIED WITH: PHARMD A. PHAM [id number] FCP    Enterobacter cloacae complex NOT DETECTED NOT DETECTED Final   Escherichia coli NOT DETECTED NOT DETECTED Final   Klebsiella oxytoca NOT DETECTED NOT DETECTED Final   Klebsiella pneumoniae NOT DETECTED NOT DETECTED Final   Proteus species DETECTED (A) NOT DETECTED Final    Comment: CRITICAL RESULT CALLED TO, READ BACK BY AND VERIFIED WITH: PHARMD A. PHAM 0840 [id number] FCP    Serratia marcescens NOT DETECTED NOT DETECTED Final   Carbapenem resistance NOT DETECTED NOT DETECTED Final   Haemophilus influenzae NOT DETECTED NOT DETECTED Final   Neisseria meningitidis NOT DETECTED NOT DETECTED Final   Pseudomonas aeruginosa NOT DETECTED NOT DETECTED Final   Candida albicans NOT DETECTED NOT DETECTED Final   Candida glabrata NOT DETECTED NOT DETECTED Final   Candida krusei NOT DETECTED NOT DETECTED Final   Candida parapsilosis NOT DETECTED NOT DETECTED Final   Candida tropicalis NOT DETECTED NOT DETECTED Final    Comment: Performed at [facility], [place] [address] [place], [place] [address]  Urine culture     Status: Abnormal   Collection Time: 10/01/19  2:27 PM   Specimen: In/Out Cath Urine  Result Value Ref Range Status   Specimen Description   Final    IN/OUT CATH URINE Performed at [facility] [address], [place], [place] [address]    Special Requests   Final    NONE Performed at [facility], [place] [address] [place], [place] [address]    Culture (A)  Final    >=100,000 COLONIES/mL PROVIDENCIA STUARTII >=100,000 COLONIES/mL PROTEUS MIRABILIS    Report Status 10/05/2019 FINAL  Final   Organism ID, Bacteria PROVIDENCIA STUARTII (A)  Final   Organism ID, Bacteria PROTEUS MIRABILIS (A)  Final  Susceptibility   Proteus mirabilis - MIC*    AMPICILLIN <=2 SENSITIVE Sensitive     CEFAZOLIN <=4 SENSITIVE Sensitive     CEFTRIAXONE <=0.25 SENSITIVE Sensitive      CIPROFLOXACIN 1 SENSITIVE Sensitive     GENTAMICIN <=1 SENSITIVE Sensitive     IMIPENEM 8 INTERMEDIATE Intermediate     NITROFURANTOIN 256 RESISTANT Resistant     TRIMETH/SULFA <=20 SENSITIVE Sensitive     AMPICILLIN/SULBACTAM <=2 SENSITIVE Sensitive     PIP/TAZO <=4 SENSITIVE Sensitive     * >=100,000 COLONIES/mL PROTEUS MIRABILIS   Providencia stuartii - MIC*    AMPICILLIN >=32 RESISTANT Resistant     CEFAZOLIN >=64 RESISTANT Resistant     CEFTRIAXONE <=0.25 SENSITIVE Sensitive     CIPROFLOXACIN >=4 RESISTANT Resistant     GENTAMICIN RESISTANT Resistant     IMIPENEM 2 SENSITIVE Sensitive     NITROFURANTOIN 128 RESISTANT Resistant     TRIMETH/SULFA >=320 RESISTANT Resistant     AMPICILLIN/SULBACTAM >=32 RESISTANT Resistant     PIP/TAZO <=4 SENSITIVE Sensitive     * >=100,000 COLONIES/mL PROVIDENCIA STUARTII  Respiratory Panel by RT PCR (Flu A&B, Covid) - Urine, Catheterized     Status: None   Collection Time: 10/01/19  2:27 PM   Specimen: Urine, Catheterized  Result Value Ref Range Status   SARS Coronavirus 2 by RT PCR NEGATIVE NEGATIVE Final    Comment: (NOTE) SARS-CoV-2 target nucleic acids are NOT DETECTED. The SARS-CoV-2 RNA is generally detectable in upper respiratoy specimens during the acute phase of infection. The lowest concentration of SARS-CoV-2 viral copies this assay can detect is 131 copies/mL. A negative result does not preclude SARS-Cov-2 infection and should not be used as the sole basis for treatment or other patient management decisions. A negative result may occur with  improper specimen collection/handling, submission of specimen other than nasopharyngeal swab, presence of viral mutation(s) within the areas targeted by this assay, and inadequate number of viral copies (<131 copies/mL). A negative result must be combined with clinical observations, patient history, and epidemiological information. The expected result is Negative. Fact Sheet for Patients:    PinkCheek.be Fact Sheet for Healthcare Providers:  GravelBags.it This test is not yet ap proved or cleared by the [place] FDA and  has been authorized for detection and/or diagnosis of SARS-CoV-2 by FDA under an Emergency Use Authorization (EUA). This EUA will remain  in effect (meaning this test can be used) for the duration of the COVID-19 declaration under Section 564(b)(1) of the Act, 21 U.S.C. section 360bbb-3(b)(1), unless the authorization is terminated or revoked sooner.    Influenza A by PCR NEGATIVE NEGATIVE Final   Influenza B by PCR NEGATIVE NEGATIVE Final    Comment: (NOTE) The Xpert Xpress SARS-CoV-2/FLU/RSV assay is intended as an aid in  the diagnosis of influenza from Nasopharyngeal swab specimens and  should not be used as a sole basis for treatment. Nasal washings and  aspirates are unacceptable for Xpert Xpress SARS-CoV-2/FLU/RSV  testing. Fact Sheet for Patients: PinkCheek.be Fact Sheet for Healthcare Providers: GravelBags.it This test is not yet approved or cleared by the [place] FDA and  has been authorized for detection and/or diagnosis of SARS-CoV-2 by  FDA under an Emergency Use Authorization (EUA). This EUA will remain  in effect (meaning this test can be used) for the duration of the  Covid-19 declaration under Section 564(b)(1) of the Act, 21  U.S.C. section 360bbb-3(b)(1), unless the authorization is  terminated or revoked. Performed at [facility]  [place] [address] [place], [place] [address]   MRSA PCR Screening     Status: None   Collection Time: 10/01/19  6:28 PM   Specimen: Nasopharyngeal  Result Value Ref Range Status   MRSA by PCR NEGATIVE NEGATIVE Final    Comment:        The GeneXpert MRSA Assay (FDA approved for NASAL specimens only), is one component of a comprehensive MRSA  colonization surveillance program. It is not intended to diagnose MRSA infection nor to guide or monitor treatment for MRSA infections. Performed at [facility], [place] [address] [place], [place] [address]   Culture, blood (routine x 2)     Status: None   Collection Time: 10/03/19 12:49 AM   Specimen: BLOOD  Result Value Ref Range Status   Specimen Description   Final    BLOOD RIGHT ARM Performed at [facility] [address] [place], [place] [address]    Special Requests   Final    BOTTLES DRAWN AEROBIC ONLY Blood Culture adequate volume Performed at [facility] [address], [place], [place] [address]    Culture   Final    NO GROWTH 5 DAYS Performed at [facility], [place] [address], [place], [place] [address]    Report Status 10/08/2019 FINAL  Final  Culture, blood (routine x 2)     Status: None   Collection Time: 10/03/19 12:49 AM   Specimen: BLOOD  Result Value Ref Range Status   Specimen Description   Final    BLOOD RIGHT HAND Performed at [facility] [address] [place], [place] [address]    Special Requests   Final    BOTTLES DRAWN AEROBIC ONLY Blood Culture adequate volume Performed at [facility] [address], [place], [place] [address]    Culture   Final    NO GROWTH 5 DAYS Performed at [facility], [place] [address] [place],  [address]    Report Status 10/08/2019 FINAL  Final    Michel Bickers, MD [place] [place] [facility] [phone] pager   [phone] cell 10/10/2019, 11:53 AM

## 2019-10-10 NOTE — Progress Notes (Signed)
Physical Therapy Treatment Patient Details Name: Stephanie Olson MRN: [id number] DOB: Jan 27, 1941 Today's Date: 10/10/2019    History of Present Illness 79 yo female admitted from SNF with severe sepsis, AMS. Hx of DM, neuropathy, chronic foley, CKD    PT Comments    Pt progressing slow. Per RN she has not been eating much. Pt interacts with  PT and RN appropriately, follows one step commands  but states she is tired today and declines OOB to chair. Agrees to sit EOB with encouragement. BP low, pt runs low at her baseline.  Supine BP 93/48 Sitting 98/72 SpO2=90s throughout on RA HR 100-119  continue to follow in acute setting, agree with plan for return to SNF   Follow Up Recommendations  SNF     Equipment Recommendations  None recommended by PT    Recommendations for Other Services       Precautions / Restrictions Precautions Precautions: Fall Restrictions Weight Bearing Restrictions: No    Mobility  Bed Mobility Overal bed mobility: Needs Assistance Bed Mobility: Rolling;Sidelying to Sit;Sit to Supine Rolling: Mod assist;Min assist;+2 for physical assistance;+2 for safety/equipment Sidelying to sit: Mod assist;Max assist;+2 for safety/equipment   Sit to supine: Max assist;+2 for physical assistance;+2 for safety/equipment   General bed mobility comments: pt incontinent of stool in bed, RN assisting with per-care. multi-modal cues for sequencing and to self assist.  assist  to elevate trunk into sitting.  pt requiring assist with trunk and bil LEs to return to supine  Transfers                 General transfer comment: pt declined OOB today  Ambulation/Gait                 Stairs             Wheelchair Mobility    Modified Rankin (Stroke Patients Only)       Balance Overall balance assessment: Needs assistance Sitting-balance support: Bilateral upper extremity supported;Feet supported Sitting balance-Leahy Scale: Fair Sitting  balance - Comments: fair to poor, close supervision for safety, requires assist for dynamic  balance/wt shifting to remove soild bed pad                                    Cognition Arousal/Alertness: Awake/alert Behavior During Therapy: Flat affect Overall Cognitive Status: History of cognitive impairments - at baseline                                 General Comments: following most one step commands consistently, with incr time      Exercises      General Comments        Pertinent Vitals/Pain Pain Assessment: No/denies pain(later c/o unspecified pain with movment )    Home Living                      Prior Function            PT Goals (current goals can now be found in the care plan section) Acute Rehab PT Goals Patient Stated Goal: to sleep PT Goal Formulation: Patient unable to participate in goal setting Time For Goal Achievement: 10/21/19 Potential to Achieve Goals: Fair Progress towards PT goals: Progressing toward goals(slowly)    Frequency    Min 2X/week  PT Plan Current plan remains appropriate    Co-evaluation              AM-PAC PT "6 Clicks" Mobility   Outcome Measure  Help needed turning from your back to your side while in a flat bed without using bedrails?: A Lot Help needed moving from lying on your back to sitting on the side of a flat bed without using bedrails?: A Lot Help needed moving to and from a bed to a chair (including a wheelchair)?: Total Help needed standing up from a chair using your arms (e.g., wheelchair or bedside chair)?: Total Help needed to walk in hospital room?: Total Help needed climbing 3-5 steps with a railing? : Total 6 Click Score: 8    End of Session   Activity Tolerance: Patient limited by fatigue Patient left: in bed;with call bell/phone within reach;with bed alarm set Nurse Communication: Mobility status PT Visit Diagnosis: Muscle weakness (generalized)  (M62.81);Difficulty in walking, not elsewhere classified (R26.2)     Time: [id number] PT Time Calculation (min) (ACUTE ONLY): 20 min  Charges:  $Therapeutic Activity: 8-22 mins                     Baxter Flattery, PT   Acute Rehab Dept [facility]): [id number]   10/10/2019    [facility] 10/10/2019, 2:10 PM

## 2019-10-10 NOTE — Progress Notes (Signed)
Pharmacy Antibiotic Note  Stephanie Olson is a 79 y.o. female admitted on 10/01/2019 with septic shock secondary to acute bilateral pyelonephritis complicated by nonfunctioning chronic indwelling Foley catheter. Pharmacy has been consulted for Zosyn  Today, 10/10/19  D10/14 total Antibiotics  Afebrile, WBC continues to trend up  SCr remains elevated ~3, CrCl <27ml/min  Plan  Continue Zosyn 2.25g IV q8 to complete 14 days per ID  Monitor renal function, cultures, clinical course.   Height: 5\' 6"  (167.6 cm) Weight: 176 lb 9.4 oz (80.1 kg) IBW/kg (Calculated) : 59.3  Temp (24hrs), Avg:97.7 F (36.5 C), Min:97.2 F (36.2 C), Max:98.2 F (36.8 C)  Recent Labs  Lab 10/06/19 0238 10/06/19 0238 10/07/19 0412 10/07/19 1225 10/08/19 0532 10/08/19 1657 10/09/19 0517 10/09/19 1636 10/10/19 0534  WBC 18.9*   < > 19.0*   < > 20.7* 24.5* 29.9* 30.5* 32.2*  CREATININE 2.99*  --  3.10*  --  3.05*  --  3.16*  --  3.05*  LATICACIDVEN  --   --   --   --  2.9*  --   --   --  1.5   < > = values in this interval not displayed.    Estimated Creatinine Clearance: 16.5 mL/min (A) (by C-G formula based on SCr of 3.05 mg/dL (H)).    No Known Allergies  Antimicrobials this admission: 1/24 Metronidazole x 1 1/24 Vancomycin >> 1/26 1/24 Cefepime >> 1/26 1/26 Zosyn >>   Dose adjustments/drug levels this admission:   Dose adjustments/drug levels this admission: 1/30: changed zosyn from 2.25g IV q6 to q8 due to continued rise in Scr   Microbiology results: 1/24 BCx: per BCID 4/4 bottles - providencia - resistant to amp, ancef, Cipro, gent, bactrim, Unasyn - proteus - pansensitive;  enterococcus faecalis - pansensitive 1/24 UCx: >100k proteus - resistant only to nitro and intermediate to impenem and providencia - MDR (resistant to amp, ancef, cipro, gent, nitro, bactrim, and Unasyn) 1/24 COVID: negative 1/24 Influenza A/B: negative 1/26 repeat BCx: ngF 1/24 MRSA PCR neg   Thank you  for allowing pharmacy to be a part of this patient's care.  Netta Cedars, PharmD, BCPS 10/10/2019 12:41 PM

## 2019-10-11 LAB — CBC WITH DIFFERENTIAL/PLATELET
Abs Immature Granulocytes: 0.59 10*3/uL — ABNORMAL HIGH (ref 0.00–0.07)
Basophils Absolute: 0.1 10*3/uL (ref 0.0–0.1)
Basophils Relative: 0 %
Eosinophils Absolute: 0.1 10*3/uL (ref 0.0–0.5)
Eosinophils Relative: 0 %
HCT: 23.5 % — ABNORMAL LOW (ref 36.0–46.0)
Hemoglobin: 7.3 g/dL — ABNORMAL LOW (ref 12.0–15.0)
Immature Granulocytes: 2 %
Lymphocytes Relative: 11 %
Lymphs Abs: 3.1 10*3/uL (ref 0.7–4.0)
MCH: 27.7 pg (ref 26.0–34.0)
MCHC: 31.1 g/dL (ref 30.0–36.0)
MCV: 89 fL (ref 80.0–100.0)
Monocytes Absolute: 1 10*3/uL (ref 0.1–1.0)
Monocytes Relative: 4 %
Neutro Abs: 22.9 10*3/uL — ABNORMAL HIGH (ref 1.7–7.7)
Neutrophils Relative %: 83 %
Platelets: 255 10*3/uL (ref 150–400)
RBC: 2.64 MIL/uL — ABNORMAL LOW (ref 3.87–5.11)
RDW: 15.8 % — ABNORMAL HIGH (ref 11.5–15.5)
WBC: 27.7 10*3/uL — ABNORMAL HIGH (ref 4.0–10.5)
nRBC: 0.3 % — ABNORMAL HIGH (ref 0.0–0.2)

## 2019-10-11 LAB — BASIC METABOLIC PANEL
Anion gap: 11 (ref 5–15)
BUN: 73 mg/dL — ABNORMAL HIGH (ref 8–23)
CO2: 25 mmol/L (ref 22–32)
Calcium: 7.3 mg/dL — ABNORMAL LOW (ref 8.9–10.3)
Chloride: 105 mmol/L (ref 98–111)
Creatinine, Ser: 3.4 mg/dL — ABNORMAL HIGH (ref 0.44–1.00)
GFR calc Af Amer: 14 mL/min — ABNORMAL LOW (ref >60)
GFR calc non Af Amer: 12 mL/min — ABNORMAL LOW (ref >60)
Glucose, Bld: 202 mg/dL — ABNORMAL HIGH (ref 70–99)
Potassium: 4 mmol/L (ref 3.5–5.1)
Sodium: 141 mmol/L (ref 135–145)

## 2019-10-11 LAB — MAGNESIUM: Magnesium: 1.6 mg/dL — ABNORMAL LOW (ref 1.7–2.4)

## 2019-10-11 LAB — GLUCOSE, CAPILLARY
Glucose-Capillary: 178 mg/dL — ABNORMAL HIGH (ref 70–99)
Glucose-Capillary: 127 mg/dL — ABNORMAL HIGH (ref 70–99)
Glucose-Capillary: 97 mg/dL (ref 70–99)
Glucose-Capillary: 93 mg/dL (ref 70–99)

## 2019-10-11 MED ORDER — MAGNESIUM SULFATE 2 GM/50ML IV SOLN
2.0000 g | Freq: Once | INTRAVENOUS | Status: AC
  Administered 2019-10-11: 2 g via INTRAVENOUS
  Filled 2019-10-11: qty 50

## 2019-10-11 MED ORDER — MAGNESIUM SULFATE 4 GM/100ML IV SOLN
4.0000 g | Freq: Once | INTRAVENOUS | Status: DC
  Filled 2019-10-11: qty 100

## 2019-10-11 NOTE — Progress Notes (Signed)
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  Reportedly she is alert and oriented x 4 at base line and was willing to transfer back to assisted living from nursing home in near future.  In the emergency room, temperature 100.1.  Heart rate 114, respiratory 24, blood pressure 86/61.  On room air.  WBC count 14,000, procalcitonin 46, lactic acid 4.  Chest x-ray patchy bibasilar opacities, left greater than right.  CT abdomen pelvis with bilateral hydronephrosis with pyelonephritis and bladder distention.  Resuscitated, started on sepsis protocol and admitted to stepdown unit. 1/29: Mental status improving.  Started having maroon-colored stool with drop in hemoglobin, had EGD done showed non bleeding duodenal ulcer.  1/30: Mental status both are improving.  Renal functions fluctuate.  Foley functioning well and adequate urine output.  2/3/221: Seen and examined.  Somnolent but easily arousable to voices.  Declines OOB to chair today.  Minimally interactive.        Assessment/Plan: Principal Problem:   Severe sepsis (HCC) Active Problems:   Renal failure (ARF), acute on chronic (HCC)   Pressure injury of skin   Malnutrition of moderate degree   Lactic acidosis   Acute pyelonephritis   Hypotension   Hypokalemia   Chronic indwelling Foley catheter   Sinus tachycardia   Bacteremia due to Enterococcus   Bacteremia due to Gram-negative bacteria  Severe sepsis secondary to polymicrobial bacteremia likely secondary to a urinary source Presented with leukocytosis, tachycardia, tachypnea with positive urine analysis, urine  culture, and blood cultures with significantly elevated procalcitonin 68 which is trending down to 6 >> 3.55 Blood cultures drawn on 10/01/2019 + for Proteus mirabilis, Providencia stuartii, Enterococcus faecalis.  Repeat blood cultures done on 10/03/2019 negative final. Seen by infectious disease with recommendation for 14 days of Zosyn. Day #10 of antibiotics. Afebrile wbc is trending down Continue midodrine 2.5 mg 3 times daily to maintain MAP greater than 65 Infectious disease following  Polymicrobial bacteremia 2D echo was obtained in the setting of Enterococcus bacteremia, did not show any evidence of vegetation. Infectious disease following and made recommendations as stated above.  Resolving Hypotension in the setting of sepsis Maintain MAP greater than 65 Continue midodrine 2.5 mg 3 times daily and florinef for adrenal insufficiency  Acute metabolic encephalopathy in the setting of acute illness, previous CVA, and suspected underlying dementia CT head done on 10/03/2019 showed no acute intracranial abnormality. 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   Per medical record no prior history of dementia or CVA MRI brain showed Multiple old basal ganglia small vessel infarcts. Generalized atrophy and severe chronic small vessel ischemic microangiopathy. Continue to reorient as needed.  Continue fall precautions.  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 Lasix held 1.4L UO Nephrology following  Hypomagnesemia Mag 1.6 Replaced  Resolved Hypokalemia, likely contributed by bicarb Repleted  Steroid-induced hyperglycemia with labile blood sugars/hypoglycemia A1c 6.4 on 10/08/2019 Hold off insulin coverage due to hypoglycemia with serum blood glucose 48 on 10/09/2019.  Hypoglycemia  corrected with D50 x  1  Chronic urinary retention s/p indwelling foley catheter, poa/complex cyst right kidney/Resolved b/l hydronephrosis Complex cyst in the upper pole of the right kidney stable from previous ultrasounds. B/L hydronephrosis seen on CT appears to be resolved on [place] C/w indwelling foley catheter, poa Will need to follow-up with urology  Resolved anion gap metabolic acidosis Received  bicarb replacement.  History of previous CVA, seen on CT head Continue pravastatin Hold ASA and restart in 2 weeks per GI (Dr. Watt Climes 10/10/2019) LDL 95, goal less than 70 on 10/08/2019. Hemoglobin A1c 6.4 on 10/08/2019. Sinus tachycardia on last EKG Monitor on telemetry  Newly diagnosed systolic CHF 2D echo done to rule out endocarditis revealed LVEF 35 to 40% with severe left ventricular hypokinesis Does not appear to follow with cardiology outpatient Seen by cardiology.  Signed off. Contact cardiology prior to dc. Net I&O +13.5 L>> +12.1L>> + 11.9L Continue to monitor volume status Continue strict I's and O's and daily weight Heart failure medications on hold due to hypotension.  H pylori infection Per GI: 1.  Follow up H. Pylori and treat (likely in a few weeks after she's over this acute hospitalization) if positive. 2.  Pantoprazole 40 mg po bid qac x 6 weeks, then 40 mg po qd qac thereafter indefinitely.  Prolonged QTC Avoid prolonging QTC agents Repeat twelve-lead EKG.  Chronic anxiety/depression Continue home medication   Adrenal insufficiency Continue Florinef and midodrine  Acute upper GI bleeding: Suspected upper GI bleeding with multiple episodes of maroon-colored stool.   Positive FOBT on 10/06/2019. She was also on steroids. Hemoglobin dropped from 9.6-8-7.5>>7.3 Continue p.o. Protonix 40 mg twice daily x6 weeks then daily per GI. H pylori positive  Acute blood loss anemia secondary to suspected upper GI bleed Management as stated above.  Physical debility PT  assessment recommended SNF. TOC consulted to assist with SNF placement. Continue PT OT with assistance and fall precautions   DVT prophylaxis: SCDs Code Status: Full code  Consults: Cardiology, nephrology.  Family Communication:  Updated her son [name] 10/11/19   Disposition Plan: patient is from skilled nursing facility. Anticipated DC to skilled nursing facility, Barriers to discharge active treatment for severe sepsis.       Objective: Vitals:   10/11/19 0332 10/11/19 0354 10/11/19 0500 10/11/19 0543  BP: 94/72 98/68  95/71  Pulse: (!) 106 (!) 104  (!) 104  Resp: 18 18  18   Temp: 97.7 F (36.5 C) 97.8 F (36.6 C)  97.7 F (36.5 C)  TempSrc: Oral Oral  Oral  SpO2: 99% 91%  98%  Weight:   82.4 kg   Height:        Intake/Output Summary (Last 24 hours) at 10/11/2019 1447 Last data filed at 10/11/2019 1353 Gross per 24 hour  Intake 1229.86 ml  Output 1475 ml  Net -245.14 ml   Filed Weights   10/09/19 0600 10/10/19 0500 10/11/19 0500  Weight: 82 kg 80.1 kg 82.4 kg    Exam:  . General: 79 y.o. year-old female well-developed well-nourished in no acute distress.  Somnolent but easily arousable to voices.   . Cardiovascular: Regular rate and rhythm no rubs or gallops. Marland Kitchen Respiratory: Mild rales at bases no wheezing noted. . Abdomen: Obese nontender bowel sounds present. Musculoskeletal: Trace lower extremity edema bilaterally.   Psychiatry: Mood is appropriate for condition and setting.  Data Reviewed: CBC: Recent Labs  Lab 10/08/19 1657 10/09/19 0517 10/09/19 1636 10/10/19 0534 10/11/19 0508  WBC 24.5* 29.9* 30.5* 32.2* 27.7*  NEUTROABS 18.3* 22.0* 24.8* 27.4* 22.9*  HGB 7.7* 8.0* 8.5* 8.3* 7.3*  HCT 24.9* 25.9* 27.1* 27.3* 23.5*  MCV 92.2 90.9 90.6 92.9 89.0  PLT 194 213 215 235 [id number]   Basic Metabolic Panel: Recent Labs  Lab 10/06/19 0238 10/06/19 0238 10/07/19 0412 10/07/19 0412 10/07/19 2102 10/08/19 0532 10/09/19 0517 10/09/19 1711  10/10/19 0534 10/11/19 0508  NA 146*   < > 143  --   --  135 138  --  148* 141  K 4.7   < > 4.2   < >  --  3.9 3.1* 3.9 4.8 4.0  CL 117*   < > 115*  --   --  102 102  --  110 105  CO2 20*   < > 18*  --   --  20* 26  --  22 25  GLUCOSE 256*   < > 229*  --   --  378* 48*  --  142* 202*  BUN 82*   < > 87*   < > 84* 80* 78*  --  62* 73*  CREATININE 2.99*   < > 3.10*  --   --  3.05* 3.16*  --  3.05* 3.40*  CALCIUM 8.1*   < > 7.9*  --   --  7.5* 7.4*  --  7.0* 7.3*  MG 1.9  --  2.0  --   --  1.8  --   --   --  1.6*  PHOS 3.3  --  3.4  --   --  3.3  --   --   --   --    < > = values in this interval not displayed.   GFR: Estimated Creatinine Clearance: 15 mL/min (A) (by C-G formula based on SCr of 3.4 mg/dL (H)). Liver Function Tests: Recent Labs  Lab 10/10/19 0534  AST 20  ALT 28  ALKPHOS 46  BILITOT 0.5  PROT 4.2*  ALBUMIN 1.6*   No results for input(s): LIPASE, AMYLASE in the last 168 hours. No results for input(s): AMMONIA in the last 168 hours. Coagulation Profile: Recent Labs  Lab 10/07/19 0412  INR 1.5*   Cardiac Enzymes: No results for input(s): CKTOTAL, CKMB, CKMBINDEX, TROPONINI in the last 168 hours. BNP (last 3 results) No results for input(s): PROBNP in the last 8760 hours. HbA1C: No results for input(s): HGBA1C in the last 72 hours. CBG: Recent Labs  Lab 10/10/19 1117 10/10/19 1620 10/10/19 2149 10/11/19 0731 10/11/19 1212  GLUCAP 207* 238* 267* 178* 127*   Lipid Profile: No results for input(s): CHOL, HDL, LDLCALC, TRIG, CHOLHDL, LDLDIRECT in the last 72 hours. Thyroid Function Tests: No results for input(s): TSH, T4TOTAL, FREET4, T3FREE, THYROIDAB in the last 72 hours. Anemia Panel: No results for input(s): VITAMINB12, FOLATE, FERRITIN, TIBC, IRON, RETICCTPCT in the last 72 hours. Urine analysis:    Component Value Date/Time   COLORURINE YELLOW 10/09/2019 1403   APPEARANCEUR TURBID (A) 10/09/2019 1403   LABSPEC 1.008 10/09/2019 1403   PHURINE  5.0 10/09/2019 1403   GLUCOSEU NEGATIVE 10/09/2019 1403   HGBUR MODERATE (A) 10/09/2019 1403   BILIRUBINUR NEGATIVE 10/09/2019 1403   KETONESUR NEGATIVE 10/09/2019 1403   PROTEINUR NEGATIVE 10/09/2019 1403   UROBILINOGEN 0.2 03/03/2010 0808   NITRITE NEGATIVE 10/09/2019 1403   LEUKOCYTESUR LARGE (A) 10/09/2019 1403   Sepsis Labs: @LABRCNTIP (procalcitonin:4,lacticidven:4)  ) Recent Results (from the past 240 hour(s))  MRSA PCR Screening     Status: None   Collection Time:  10/01/19  6:28 PM   Specimen: Nasopharyngeal  Result Value Ref Range Status   MRSA by PCR NEGATIVE NEGATIVE Final    Comment:        The GeneXpert MRSA Assay (FDA approved for NASAL specimens only), is one component of a comprehensive MRSA colonization surveillance program. It is not intended to diagnose MRSA infection nor to guide or monitor treatment for MRSA infections. Performed at [facility], [place] [address] [place], [place] [address]   Culture, blood (routine x 2)     Status: None   Collection Time: 10/03/19 12:49 AM   Specimen: BLOOD  Result Value Ref Range Status   Specimen Description   Final    BLOOD RIGHT ARM Performed at [facility] [address], [place], [place] [address]    Special Requests   Final    BOTTLES DRAWN AEROBIC ONLY Blood Culture adequate volume Performed at [facility] [address], [place], [place] [address]    Culture   Final    NO GROWTH 5 DAYS Performed at [facility], [place] [address], [place], [place] [address]    Report Status 10/08/2019 FINAL  Final  Culture, blood (routine x 2)     Status: None   Collection Time: 10/03/19 12:49 AM   Specimen: BLOOD  Result Value Ref Range Status   Specimen Description   Final    BLOOD RIGHT HAND Performed at [facility] [address] [place], [place] [address]    Special Requests   Final    BOTTLES DRAWN AEROBIC ONLY Blood Culture  adequate volume Performed at [facility] [address], [place], [place] [address]    Culture   Final    NO GROWTH 5 DAYS Performed at [facility], [place] [address], [place], [place] [address]    Report Status 10/08/2019 FINAL  Final      Studies: No results found.  Scheduled Meds: . Chlorhexidine Gluconate Cloth  6 each Topical Daily  . DULoxetine  20 mg Oral Daily  . feeding supplement (ENSURE ENLIVE)  237 mL Oral 4x daily  . fludrocortisone  0.1 mg Oral Daily  . furosemide  20 mg Oral Daily  . insulin aspart  0-5 Units Subcutaneous QHS  . insulin aspart  0-9 Units Subcutaneous TID WC  . insulin aspart  3 Units Subcutaneous TID WC  . insulin glargine  4 Units Subcutaneous QHS  . mouth rinse  15 mL Mouth Rinse BID  . midodrine  2.5 mg Oral TID WC  . pantoprazole  40 mg Oral BID AC  . pravastatin  40 mg Oral QHS    Continuous Infusions: . magnesium sulfate bolus IVPB    . piperacillin-tazobactam (ZOSYN)  IV 2.25 g (10/11/19 1353)     LOS: 10 days     Kayleen Memos, MD [facility] Pager [phone]  If 7PM-7AM, please contact night-coverage [email or web address] Password [facility] 10/11/2019, 2:47 PM

## 2019-10-11 NOTE — Progress Notes (Signed)
Updated patient's son [name] via phone.  All questions answered.

## 2019-10-11 NOTE — Progress Notes (Signed)
Patient ID: Stephanie Olson, female   DOB: July 25, 1941, 79 y.o.   MRN: [id number]          [facility] for Infectious Disease    Date of Admission:  10/01/2019   Total days of antibiotics 11        Day 3 piperacillin tazobactam  Ms. Thibaudeau is resting quietly and comfortably in bed.  She remains afebrile and her white count is now coming back down.  I recommend 3 more days of piperacillin tazobactam for her polymicrobial UTI and bacteremia.  Please call if I can be of further assistance.         Michel Bickers, MD [facility]ious [facility] [phone] pager   [phone] cell 10/11/2019, 5:59 PM

## 2019-10-11 NOTE — TOC Progression Note (Signed)
Transition of Care [facility]) - Progression Note    Patient Details  Name: Stephanie Olson MRN: [id number] Date of Birth: November 06, 1940  Transition of Care [facility]) CM/SW Contact  Yoseph Haile, Juliann Pulse, RN Phone Number: 10/11/2019, 11:42 AM  Clinical Narrative: d/c plan return back to [facility] when medically stable.      Expected Discharge Plan: Skilled Nursing Facility Barriers to Discharge: Continued Medical Work up  Expected Discharge Plan and Services Expected Discharge Plan: [place]   Discharge Planning Services: NA   Living arrangements for the past 2 months: [place]                   DME Agency: NA       HH Arranged: NA           Social Determinants of Health (SDOH) Interventions    Readmission Risk Interventions Readmission Risk Prevention Plan 10/04/2019  Transportation Screening Complete  Medication Review Press photographer) Referral to Pharmacy  PCP or Specialist appointment within 3-5 days of discharge Complete  HRI or [place] Not Complete  HRI or Home Care Consult Pt Refusal Comments Patient is long term care at [place] Complete  Some recent data might be hidden

## 2019-10-11 NOTE — Progress Notes (Signed)
   10/11/19 0253  Vitals  ECG Heart Rate (!) 194  Cardiac Rhythm SVT (EPIS.    Blane Worthington RN AWARE )  EKG   EKG performed? Placed on chart RN aware  MEWS Score  MEWS Temp 0  MEWS Systolic 1  MEWS Pulse 3  MEWS RR 0  MEWS LOC 0  MEWS Score 4  MEWS Score Color Red  MEWS Assessment  Is this an acute change? Yes  MEWS guidelines implemented *See [place]  Provider Notification  Provider Name/Title Baltazar Najjar, NP  Date Provider Notified 10/11/19  Time Provider Notified [phone]  Notification Type Page  Notification Reason Change in status  Response See new orders  Date of Provider Response 10/11/19  Time of Provider Response 0311  Rapid Response Notification  Name of Rapid Response RN Notified Colvin Caroli, RN  Date Rapid Response Notified 10/11/19  Time Rapid Response Notified [id number]

## 2019-10-11 NOTE — Progress Notes (Signed)
[facility] Progress Note    Assessment/Plan **AKI on CKD:  Baseline renal function Cr 1.5 -2 and now ~3.4 in setting of septic shock and obstruction.  No contrast exposure, no NSAIDs.  No rashes or fevers to suggest AIN but time course raises the question; UA without WBC casts noted - I am OK to cont zosyn for now.   Creatinine continues to trend up slowly to 3.4 today and as such will hold on diuretics today.  At this point would just continue supportive care to maintain euvolemia, avoid nephrotoxins.  Will just need time for recovery and may not achieve prior baseline.  She is a poor candidate for long term dialysis should need arise.   **Sepsis secondary to polymicrobial bacteremia and UTI:  Antibiotics per ID -- planning 14 d course zosyn.  Some improvement in leukocytosis today and remains afebrile. Per primary.   **HFrEF:  Cardiology following.  No BB and RAAS inhibition with low BP and CKD. Diuresed some 2/1 and 2/2 with lasix 40 IV x 1 dose each day but in light of rising Cr will hold diuresis today. Low na diet, daily weights.   **ABLA secondary to presumed GIB:  Hb initially 10.1 now in the 7s.  No IV iron in setting of bacteremia.  CTM, may dose with ESA prior to discharge pending course.  Transfusion per primary.   **Hypokalemia: supplement PRN, now 4.0.   **Adrenal insufficiency:  On florinef and midodrine.  In future would consider stress dose steroids if septic.   **h/o CVA: per primary.   **R complex cyst: stable, CTM outpt.   [name] MD [facility] Pager: [phone]  ________________________________________________________________________  Subjective:   No c/os - says sleepy but otherwise no concerns.  Says no dysgeusia. Noted SVT to 190s overnight but no assoc hypotension.   I/Os yest 1.2 / 1.475L with lasix 40 IV.  Wt 2/1 82 > 2/2 80.1 > 2/3 82.4kg (admission wt 1/24 was either 67.7 or 72.2kg) and per I/Os for  hospitalization she's net + 11L  Objective Vitals:   10/11/19 0332 10/11/19 0354 10/11/19 0500 10/11/19 0543  BP: 94/72 98/68  95/71  Pulse: (!) 106 (!) 104  (!) 104  Resp: 18 18  18   Temp: 97.7 F (36.5 C) 97.8 F (36.6 C)  97.7 F (36.5 C)  TempSrc: Oral Oral  Oral  SpO2: 99% 91%  98%  Weight:   82.4 kg   Height:       Physical Exam General: elderly woman at 45 degrees, comfortable Heart: RRR Lungs: clear Abdomen: soft Extremities: no edema   Additional Objective Labs: Basic Metabolic Panel: Recent Labs  Lab 10/06/19 0238 10/06/19 0238 10/07/19 0412 10/07/19 2102 10/08/19 0532 10/08/19 0532 10/09/19 0517 10/09/19 0517 10/09/19 1711 10/10/19 0534 10/11/19 0508  NA 146*   < > 143  --  135   < > 138  --   --  148* 141  K 4.7   < > 4.2  --  3.9   < > 3.1*   < > 3.9 4.8 4.0  CL 117*   < > 115*  --  102   < > 102  --   --  110 105  CO2 20*   < > 18*  --  20*   < > 26  --   --  22 25  GLUCOSE 256*   < > 229*  --  378*   < > 48*  --   --  142* 202*  BUN 82*   < > 87*   < > 80*   < > 78*  --   --  62* 73*  CREATININE 2.99*   < > 3.10*  --  3.05*   < > 3.16*  --   --  3.05* 3.40*  CALCIUM 8.1*   < > 7.9*  --  7.5*   < > 7.4*  --   --  7.0* 7.3*  PHOS 3.3  --  3.4  --  3.3  --   --   --   --   --   --    < > = values in this interval not displayed.   Liver Function Tests: Recent Labs  Lab 10/10/19 0534  AST 20  ALT 28  ALKPHOS 46  BILITOT 0.5  PROT 4.2*  ALBUMIN 1.6*   No results for input(s): LIPASE, AMYLASE in the last 168 hours. CBC: Recent Labs  Lab 10/08/19 1657 10/08/19 1657 10/09/19 0517 10/09/19 0517 10/09/19 1636 10/10/19 0534 10/11/19 0508  WBC 24.5*   < > 29.9*   < > 30.5* 32.2* 27.7*  NEUTROABS 18.3*   < > 22.0*   < > 24.8* 27.4* 22.9*  HGB 7.7*   < > 8.0*   < > 8.5* 8.3* 7.3*  HCT 24.9*   < > 25.9*   < > 27.1* 27.3* 23.5*  MCV 92.2  --  90.9  --  90.6 92.9 89.0  PLT 194   < > 213   < > 215 235 255   < > = values in this interval not  displayed.   Blood Culture    Component Value Date/Time   SDES  10/03/2019 0049    BLOOD RIGHT ARM Performed at [facility], [place] [address], [place], [place] [address]    SDES  10/03/2019 [phone]    BLOOD RIGHT HAND Performed at [facility], [place] [address] [place], [place] [address]    SPECREQUEST  10/03/2019 0049    BOTTLES DRAWN AEROBIC ONLY Blood Culture adequate volume Performed at [facility] [address], [place], [place] [address]    SPECREQUEST  10/03/2019 0049    BOTTLES DRAWN AEROBIC ONLY Blood Culture adequate volume Performed at [facility] [address], [place], [place] [address]    CULT  10/03/2019 0049    NO GROWTH 5 DAYS Performed at [facility], [place] [address] [place], [place] [address]    CULT  10/03/2019 0049    NO GROWTH 5 DAYS Performed at [facility], [place] [address], [place],  [address]    REPTSTATUS 10/08/2019 FINAL 10/03/2019 0049   REPTSTATUS 10/08/2019 FINAL 10/03/2019 0049    Cardiac Enzymes: No results for input(s): CKTOTAL, CKMB, CKMBINDEX, TROPONINI in the last 168 hours. CBG: Recent Labs  Lab 10/10/19 0814 10/10/19 1117 10/10/19 1620 10/10/19 2149 10/11/19 0731  GLUCAP 152* 207* 238* 267* 178*   Iron Studies: No results for input(s): IRON, TIBC, TRANSFERRIN, FERRITIN in the last 72 hours. @lablastinr3 @ Studies/Results: MR BRAIN WO CONTRAST  Result Date: 10/09/2019 CLINICAL DATA:  Encephalopathy EXAM: MRI HEAD WITHOUT CONTRAST TECHNIQUE: Multiplanar, multiecho pulse sequences of the brain and surrounding structures were obtained without intravenous contrast. COMPARISON:  Head CT 10/03/2019 FINDINGS: BRAIN: No acute infarct, acute hemorrhage or extra-axial collection. Diffuse confluent hyperintense T2-weighted signal within the periventricular, deep and juxtacortical white matter, most commonly due to chronic ischemic microangiopathy.  There  is generalized atrophy without lobar predilection. Midline structures are normal. There are multiple old supratentorial small vessel infarcts VASCULAR: Major flow voids are preserved. Susceptibility-sensitive sequences show no chronic microhemorrhage or superficial siderosis. SKULL AND UPPER CERVICAL SPINE: Normal calvarium and skull base. Visualized upper cervical spine and soft tissues are normal. SINUSES/ORBITS: No fluid levels or advanced mucosal thickening. No mastoid or middle ear effusion. Normal orbits. IMPRESSION: 1. No acute intracranial process. 2. 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 3. Generalized atrophy and severe chronic small vessel ischemic microangiopathy. Electronically Signed   By: Ulyses Jarred M.D.   On: 10/09/2019 22:30   Medications: . piperacillin-tazobactam (ZOSYN)  IV 2.25 g (10/11/19 0600)   . Chlorhexidine Gluconate Cloth  6 each Topical Daily  . DULoxetine  20 mg Oral Daily  . feeding supplement (ENSURE ENLIVE)  237 mL Oral 4x daily  . fludrocortisone  0.1 mg Oral Daily  . furosemide  20 mg Oral Daily  . insulin aspart  0-5 Units Subcutaneous QHS  . insulin aspart  0-9 Units Subcutaneous TID WC  . insulin aspart  3 Units Subcutaneous TID WC  . insulin glargine  4 Units Subcutaneous QHS  . mouth rinse  15 mL Mouth Rinse BID  . midodrine  2.5 mg Oral TID WC  . pantoprazole  40 mg Oral BID AC  . pravastatin  40 mg Oral QHS

## 2019-10-12 LAB — BASIC METABOLIC PANEL
Anion gap: 11 (ref 5–15)
BUN: 75 mg/dL — ABNORMAL HIGH (ref 8–23)
CO2: 26 mmol/L (ref 22–32)
Calcium: 7.4 mg/dL — ABNORMAL LOW (ref 8.9–10.3)
Chloride: 104 mmol/L (ref 98–111)
Creatinine, Ser: 3.28 mg/dL — ABNORMAL HIGH (ref 0.44–1.00)
GFR calc Af Amer: 15 mL/min — ABNORMAL LOW (ref >60)
GFR calc non Af Amer: 13 mL/min — ABNORMAL LOW (ref >60)
Glucose, Bld: 104 mg/dL — ABNORMAL HIGH (ref 70–99)
Potassium: 3.6 mmol/L (ref 3.5–5.1)
Sodium: 141 mmol/L (ref 135–145)

## 2019-10-12 LAB — CBC WITH DIFFERENTIAL/PLATELET
Abs Immature Granulocytes: 0.33 10*3/uL — ABNORMAL HIGH (ref 0.00–0.07)
Basophils Absolute: 0 10*3/uL (ref 0.0–0.1)
Basophils Relative: 0 %
Eosinophils Absolute: 0.2 10*3/uL (ref 0.0–0.5)
Eosinophils Relative: 1 %
HCT: 23.4 % — ABNORMAL LOW (ref 36.0–46.0)
Hemoglobin: 7 g/dL — ABNORMAL LOW (ref 12.0–15.0)
Immature Granulocytes: 2 %
Lymphocytes Relative: 15 %
Lymphs Abs: 2.9 10*3/uL (ref 0.7–4.0)
MCH: 27.6 pg (ref 26.0–34.0)
MCHC: 29.9 g/dL — ABNORMAL LOW (ref 30.0–36.0)
MCV: 92.1 fL (ref 80.0–100.0)
Monocytes Absolute: 1 10*3/uL (ref 0.1–1.0)
Monocytes Relative: 6 %
Neutro Abs: 14.5 10*3/uL — ABNORMAL HIGH (ref 1.7–7.7)
Neutrophils Relative %: 76 %
Platelets: 249 10*3/uL (ref 150–400)
RBC: 2.54 MIL/uL — ABNORMAL LOW (ref 3.87–5.11)
RDW: 16 % — ABNORMAL HIGH (ref 11.5–15.5)
WBC: 19 10*3/uL — ABNORMAL HIGH (ref 4.0–10.5)
nRBC: 0.2 % (ref 0.0–0.2)

## 2019-10-12 LAB — GLUCOSE, CAPILLARY
Glucose-Capillary: 90 mg/dL (ref 70–99)
Glucose-Capillary: 113 mg/dL — ABNORMAL HIGH (ref 70–99)
Glucose-Capillary: 142 mg/dL — ABNORMAL HIGH (ref 70–99)
Glucose-Capillary: 110 mg/dL — ABNORMAL HIGH (ref 70–99)

## 2019-10-12 LAB — PREPARE RBC (CROSSMATCH)

## 2019-10-12 LAB — ABO/RH: ABO/RH(D): O POS

## 2019-10-12 MED ORDER — SODIUM CHLORIDE 0.9% IV SOLUTION: Freq: Once | INTRAVENOUS | Status: AC

## 2019-10-12 MED ORDER — FUROSEMIDE 40 MG PO TABS
40.0000 mg | ORAL_TABLET | Freq: Once | ORAL | Status: AC
  Administered 2019-10-12: 40 mg via ORAL
  Filled 2019-10-12: qty 1

## 2019-10-12 MED ORDER — DARBEPOETIN ALFA 60 MCG/0.3ML IJ SOSY
60.0000 ug | PREFILLED_SYRINGE | Freq: Once | INTRAMUSCULAR | Status: AC
  Administered 2019-10-12: 60 ug via SUBCUTANEOUS
  Filled 2019-10-12: qty 0.3

## 2019-10-12 MED ORDER — SODIUM CHLORIDE 0.9 % IV SOLN
INTRAVENOUS | Status: DC | PRN
  Administered 2019-10-12: 17:00:00 250 mL via INTRAVENOUS

## 2019-10-12 NOTE — Progress Notes (Signed)
Pharmacist Heart Failure Core Measure Documentation  Assessment: Stephanie Olson has an EF documented as 35-40% on 10/03/19 by ECHO.  Rationale: Heart failure patients with left ventricular systolic dysfunction (LVSD) and an EF < 40% should be prescribed an angiotensin converting enzyme inhibitor (ACEI) or angiotensin receptor blocker (ARB) at discharge unless a contraindication is documented in the medical record.  This patient is not currently on an ACEI or ARB for HF.  This note is being placed in the record in order to provide documentation that a contraindication to the use of these agents is present for this encounter.  ACE Inhibitor or Angiotensin Receptor Blocker is contraindicated (specify all that apply)  []   ACEI allergy AND ARB allergy []   Angioedema []   Moderate or severe aortic stenosis []   Hyperkalemia []   Hypotension []   Renal artery stenosis [x]   Worsening renal function, preexisting renal disease or dysfunction   [name] 10/12/2019 9:27 AM

## 2019-10-12 NOTE — Progress Notes (Signed)
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  Reportedly she is alert and oriented x 4 at base line and was willing to transfer back to assisted living from nursing home in near future.  In the emergency room, temperature 100.1.  Heart rate 114, respiratory 24, blood pressure 86/61.  On room air.  WBC count 14,000, procalcitonin 46, lactic acid 4.  Chest x-ray patchy bibasilar opacities, left greater than right.  CT abdomen pelvis with bilateral hydronephrosis with pyelonephritis and bladder distention.  Resuscitated, started on sepsis protocol and admitted to stepdown unit. 1/29: Mental status improving.  Started having maroon-colored stool with drop in hemoglobin, had EGD done showed non bleeding duodenal ulcer.  1/30: Mental status both are improving.  Renal functions fluctuate.  Foley functioning well and adequate urine output.  10/12/19: Seen and examined.  More interactive today.  Very hard of hearing.  She denies any pain.      Assessment/Plan: Principal Problem:   Severe sepsis (HCC) Active Problems:   Renal failure (ARF), acute on chronic (HCC)   Pressure injury of skin   Malnutrition of moderate degree   Lactic acidosis   Acute pyelonephritis   Hypotension   Hypokalemia   Chronic indwelling Foley catheter   Sinus tachycardia   Bacteremia due to Enterococcus   Bacteremia due to Gram-negative bacteria  Resolving severe sepsis secondary to polymicrobial bacteremia likely secondary to a urinary source Presented with leukocytosis, tachycardia, tachypnea with positive urine analysis, urine culture, and blood  cultures with significantly elevated procalcitonin 68 which is trending down to 6 >> 3.55 Blood cultures drawn on 10/01/2019 + for Proteus mirabilis, Providencia stuartii, Enterococcus faecalis.  Repeat blood cultures done on 10/03/2019 negative final. Seen by infectious disease with recommendation for 14 days of Zosyn. Day #12 of antibiotics. Afebrile wbc is trending down Continue midodrine 2.5 mg 3 times daily to maintain MAP greater than 65 Infectious disease following  Polymicrobial bacteremia 2D echo was obtained in the setting of Enterococcus bacteremia, did not show any evidence of vegetation. Infectious disease following and made recommendations as stated above.  Resolving Hypotension in the setting of sepsis Maintain MAP greater than 65 Continue midodrine 2.5 mg 3 times daily and florinef for adrenal insufficiency  Acute metabolic encephalopathy in the setting of acute illness, previous CVA, and suspected underlying dementia CT head done on 10/03/2019 showed no acute intracranial abnormality. 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   Per medical record no prior history of dementia or CVA MRI brain showed Multiple old basal ganglia small vessel infarcts. Generalized atrophy and severe chronic small vessel ischemic microangiopathy. Continue to reorient as needed.  Continue fall precautions.  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 Continue management per nephrology.  Hypomagnesemia Mag 1.6 Replaced  Resolved Hypokalemia, likely contributed by bicarb Repleted  Steroid-induced hyperglycemia with labile blood sugars/hypoglycemia A1c 6.4 on 10/08/2019 Hold off insulin coverage due to hypoglycemia with serum blood glucose 48 on 10/09/2019.  Hypoglycemia  corrected with D50 x 1  Chronic urinary retention s/p indwelling foley catheter, poa/complex cyst right kidney/Resolved b/l hydronephrosis Complex cyst in the upper pole of the right kidney stable from previous ultrasounds. B/L hydronephrosis seen on CT appears to be resolved on [place] C/w indwelling foley catheter, poa Will need to follow-up with urology  Resolved anion gap metabolic acidosis Received  bicarb replacement.  History of previous CVA, seen on CT head Continue pravastatin Hold ASA and restart in 2 weeks per GI (Dr. Watt Climes 10/10/2019) LDL 95, goal less than 70 on 10/08/2019. Hemoglobin A1c 6.4 on 10/08/2019. Sinus tachycardia on last EKG Monitor on telemetry  Newly diagnosed systolic CHF 2D echo done to rule out endocarditis revealed LVEF 35 to 40% with severe left ventricular hypokinesis Does not appear to follow with cardiology outpatient Seen by cardiology.  Signed off. Contact cardiology prior to dc. Continue to monitor volume status Continue strict I's and O's and daily weight Heart failure medications on hold due to hypotension.  H pylori infection Per GI: 1.  Follow up H. Pylori and treat (likely in a few weeks after she's over this acute hospitalization) if positive. 2.  Pantoprazole 40 mg po bid qac x 6 weeks, then 40 mg po qd qac thereafter indefinitely.  Prolonged QTC Avoid prolonging QTC agents Repeat twelve-lead EKG.  Chronic anxiety/depression Continue home medication   Adrenal insufficiency Continue Florinef and midodrine  Acute upper GI bleeding: Suspected upper GI bleeding with multiple episodes of maroon-colored stool.   Positive FOBT on 10/06/2019. She was also on steroids. Hemoglobin dropped from 9.6-8-7.5>>7.3>> 7.0 Continue p.o. Protonix 40 mg twice daily x6 weeks then daily per GI. H pylori positive  Acute blood loss anemia secondary to suspected upper GI bleed Management as stated above. Hemoglobin dropped down to 7.0 1 unit PRBC ordered to  be transfused Repeat CBC in the morning  Physical debility PT assessment recommended SNF. TOC consulted to assist with SNF placement. Continue PT OT with assistance and fall precautions   DVT prophylaxis: SCDs Code Status: Full code  Consults: Cardiology, nephrology.  Family Communication:  Updated her son [name] 10/11/19   Disposition Plan: patient is from skilled nursing facility. Anticipated DC to skilled nursing facility, Barriers to discharge active treatment for severe sepsis with polymicrobial bacteremia..       Objective: Vitals:   10/12/19 0557 10/12/19 1212 10/12/19 1240 10/12/19 1548  BP: 97/62 (!) 87/54 (!) 95/58 101/67  Pulse: 96 98 96 99  Resp: 18 18 18 18   Temp: 98.7 F (37.1 C) 97.6 F (36.4 C) 97.6 F (36.4 C) 97.9 F (36.6 C)  TempSrc:  Oral  Oral  SpO2: 100% 92% 98% 96%  Weight:      Height:        Intake/Output Summary (Last 24 hours) at 10/12/2019 1623 Last data filed at 10/12/2019 1548 Gross per 24 hour  Intake 986 ml  Output 300 ml  Net 686 ml   Filed Weights   10/09/19 0600 10/10/19 0500 10/11/19 0500  Weight: 82 kg 80.1 kg 82.4 kg    Exam:  . General: 79 y.o. year-old female well-developed well-nourished no acute distress.  Very hard of hearing. . Cardiovascular: Regular rate and rhythm no rubs or gallops.   Marland Kitchen Respiratory: Mild rales at bases no wheezing noted. Abdomen: Obese nontender normal bowel sounds present.   Musculoskeletal: Trace of edema in lower extremities bilaterally.   Psychiatry: Mood is appropriate for condition and setting.   Data Reviewed: CBC: Recent Labs  Lab 10/09/19 0517 10/09/19  1636 10/10/19 0534 10/11/19 0508 10/12/19 0520  WBC 29.9* 30.5* 32.2* 27.7* 19.0*  NEUTROABS 22.0* 24.8* 27.4* 22.9* 14.5*  HGB 8.0* 8.5* 8.3* 7.3* 7.0*  HCT 25.9* 27.1* 27.3* 23.5* 23.4*  MCV 90.9 90.6 92.9 89.0 92.1  PLT 213 215 235 255 0000000   Basic Metabolic Panel: Recent Labs  Lab 10/06/19 0238 10/06/19 0238  10/07/19 0412 10/07/19 2102 10/08/19 0532 10/08/19 0532 10/09/19 0517 10/09/19 1711 10/10/19 0534 10/11/19 0508 10/12/19 0520  NA 146*   < > 143  --  135  --  138  --  148* 141 141  K 4.7   < > 4.2  --  3.9   < > 3.1* 3.9 4.8 4.0 3.6  CL 117*   < > 115*  --  102  --  102  --  110 105 104  CO2 20*   < > 18*  --  20*  --  26  --  22 25 26   GLUCOSE 256*   < > 229*  --  378*  --  48*  --  142* 202* 104*  BUN 82*   < > 87*   < > 80*  --  78*  --  62* 73* 75*  CREATININE 2.99*   < > 3.10*  --  3.05*  --  3.16*  --  3.05* 3.40* 3.28*  CALCIUM 8.1*   < > 7.9*  --  7.5*  --  7.4*  --  7.0* 7.3* 7.4*  MG 1.9  --  2.0  --  1.8  --   --   --   --  1.6*  --   PHOS 3.3  --  3.4  --  3.3  --   --   --   --   --   --    < > = values in this interval not displayed.   GFR: Estimated Creatinine Clearance: 15.5 mL/min (A) (by C-G formula based on SCr of 3.28 mg/dL (H)). Liver Function Tests: Recent Labs  Lab 10/10/19 0534  AST 20  ALT 28  ALKPHOS 46  BILITOT 0.5  PROT 4.2*  ALBUMIN 1.6*   No results for input(s): LIPASE, AMYLASE in the last 168 hours. No results for input(s): AMMONIA in the last 168 hours. Coagulation Profile: Recent Labs  Lab 10/07/19 0412  INR 1.5*   Cardiac Enzymes: No results for input(s): CKTOTAL, CKMB, CKMBINDEX, TROPONINI in the last 168 hours. BNP (last 3 results) No results for input(s): PROBNP in the last 8760 hours. HbA1C: No results for input(s): HGBA1C in the last 72 hours. CBG: Recent Labs  Lab 10/11/19 1212 10/11/19 1636 10/11/19 2041 10/12/19 0755 10/12/19 1152  GLUCAP 127* 97 93 90 113*   Lipid Profile: No results for input(s): CHOL, HDL, LDLCALC, TRIG, CHOLHDL, LDLDIRECT in the last 72 hours. Thyroid Function Tests: No results for input(s): TSH, T4TOTAL, FREET4, T3FREE, THYROIDAB in the last 72 hours. Anemia Panel: No results for input(s): VITAMINB12, FOLATE, FERRITIN, TIBC, IRON, RETICCTPCT in the last 72 hours. Urine analysis:      Component Value Date/Time   COLORURINE YELLOW 10/09/2019 1403   APPEARANCEUR TURBID (A) 10/09/2019 1403   LABSPEC 1.008 10/09/2019 1403   PHURINE 5.0 10/09/2019 1403   GLUCOSEU NEGATIVE 10/09/2019 1403   HGBUR MODERATE (A) 10/09/2019 1403   BILIRUBINUR NEGATIVE 10/09/2019 1403   KETONESUR NEGATIVE 10/09/2019 1403   PROTEINUR NEGATIVE 10/09/2019 1403   UROBILINOGEN 0.2 03/03/2010 0808   NITRITE NEGATIVE 10/09/2019 1403  LEUKOCYTESUR LARGE (A) 10/09/2019 1403   Sepsis Labs: @LABRCNTIP (procalcitonin:4,lacticidven:4)  ) Recent Results (from the past 240 hour(s))  Culture, blood (routine x 2)     Status: None   Collection Time: 10/03/19 12:49 AM   Specimen: BLOOD  Result Value Ref Range Status   Specimen Description   Final    BLOOD RIGHT ARM Performed at [facility] [address] [place], [place] [address]    Special Requests   Final    BOTTLES DRAWN AEROBIC ONLY Blood Culture adequate volume Performed at [facility] [address] [place], [place] [address]    Culture   Final    NO GROWTH 5 DAYS Performed at [facility], [place] [address], [place], [place] [address]    Report Status 10/08/2019 FINAL  Final  Culture, blood (routine x 2)     Status: None   Collection Time: 10/03/19 12:49 AM   Specimen: BLOOD  Result Value Ref Range Status   Specimen Description   Final    BLOOD RIGHT HAND Performed at [facility] [place] [address] [place], [place] [address]    Special Requests   Final    BOTTLES DRAWN AEROBIC ONLY Blood Culture adequate volume Performed at [facility] [address] [place], [place] [address]    Culture   Final    NO GROWTH 5 DAYS Performed at [facility], [place] [address], [place],  [address]    Report Status 10/08/2019 FINAL  Final      Studies: No results found.  Scheduled Meds: . Chlorhexidine Gluconate Cloth  6 each Topical Daily  .  DULoxetine  20 mg Oral Daily  . feeding supplement (ENSURE ENLIVE)  237 mL Oral 4x daily  . fludrocortisone  0.1 mg Oral Daily  . insulin aspart  0-5 Units Subcutaneous QHS  . insulin aspart  0-9 Units Subcutaneous TID WC  . insulin aspart  3 Units Subcutaneous TID WC  . insulin glargine  4 Units Subcutaneous QHS  . mouth rinse  15 mL Mouth Rinse BID  . midodrine  2.5 mg Oral TID WC  . pantoprazole  40 mg Oral BID AC  . pravastatin  40 mg Oral QHS    Continuous Infusions: . piperacillin-tazobactam (ZOSYN)  IV Stopped (10/12/19 0525)     LOS: 11 days     Kayleen Memos, MD [facility] Pager [phone]  If 7PM-7AM, please contact night-coverage [email or web address] Password [facility] 10/12/2019, 4:23 PM

## 2019-10-12 NOTE — Progress Notes (Signed)
[facility] Progress Note    Assessment/Plan **AKI on CKD:  Baseline renal function Cr 1.5 -2 and now ~3.4 in setting of septic shock and obstruction.  No contrast exposure, no NSAIDs.  No rashes or fevers to suggest AIN but time course raises the question; UA without WBC casts noted - I am OK to cont zosyn for now.   Creatinine slightly improved at 3.28 today.  At this point would just continue supportive care to maintain euvolemia, avoid nephrotoxins.  Will just need time for recovery and may not achieve prior baseline.  She is a poor candidate for long term dialysis should need arise.   **Sepsis secondary to polymicrobial bacteremia and UTI:  Antibiotics per ID -- planning 14 d course zosyn.  Further improvement in leukocytosis today and remains afebrile. Per primary.   **HFrEF:  Cardiology following.  No BB and RAAS inhibition with low BP and CKD. Diuresed some 2/1 and 2/2 with lasix 40 IV x 1 dose each day but in light of rising Cr held diuresis yesterday.  Will give lasix 40 po today. Low na diet, daily weights.   **ABLA secondary to presumed GIB:  Hb initially 10.1 now in the 7s.  No IV iron in setting of bacteremia.  Will dose with aranesp 60 today, may need outpt.  Transfusion per primary.   **Adrenal insufficiency:  On florinef and midodrine.  In future would consider stress dose steroids if septic.   **h/o CVA: per primary.   **R complex cyst: stable, CTM outpt.   [name] MD [facility] Pager: [phone]  ________________________________________________________________________  Subjective:   Sleepy today, no c/os.   Objective Vitals:   10/11/19 0543 10/11/19 1519 10/11/19 2043 10/12/19 0557  BP: 95/71  103/63 97/62  Pulse: (!) 104  95 96  Resp: 18  17 18   Temp: 97.7 F (36.5 C)  97.8 F (36.6 C) 98.7 F (37.1 C)  TempSrc: Oral  Oral   SpO2: 98% 98% 100% 100%  Weight:      Height:       Physical Exam General:  elderly woman at 45 degrees, comfortable Heart: RRR Lungs: clear Abdomen: soft Extremities: no edema   Additional Objective Labs: Basic Metabolic Panel: Recent Labs  Lab 10/06/19 0238 10/06/19 0238 10/07/19 0412 10/07/19 2102 10/08/19 0532 10/09/19 0517 10/10/19 0534 10/11/19 0508 10/12/19 0520  NA 146*   < > 143  --  135   < > 148* 141 141  K 4.7   < > 4.2  --  3.9   < > 4.8 4.0 3.6  CL 117*   < > 115*  --  102   < > 110 105 104  CO2 20*   < > 18*  --  20*   < > 22 25 26   GLUCOSE 256*   < > 229*  --  378*   < > 142* 202* 104*  BUN 82*   < > 87*   < > 80*   < > 62* 73* 75*  CREATININE 2.99*   < > 3.10*  --  3.05*   < > 3.05* 3.40* 3.28*  CALCIUM 8.1*   < > 7.9*  --  7.5*   < > 7.0* 7.3* 7.4*  PHOS 3.3  --  3.4  --  3.3  --   --   --   --    < > = values in this interval not displayed.   Liver Function Tests: Recent  Labs  Lab 10/10/19 0534  AST 20  ALT 28  ALKPHOS 46  BILITOT 0.5  PROT 4.2*  ALBUMIN 1.6*   No results for input(s): LIPASE, AMYLASE in the last 168 hours. CBC: Recent Labs  Lab 10/09/19 0517 10/09/19 0517 10/09/19 1636 10/09/19 1636 10/10/19 0534 10/11/19 0508 10/12/19 0520  WBC 29.9*   < > 30.5*   < > 32.2* 27.7* 19.0*  NEUTROABS 22.0*   < > 24.8*   < > 27.4* 22.9* 14.5*  HGB 8.0*   < > 8.5*   < > 8.3* 7.3* 7.0*  HCT 25.9*   < > 27.1*   < > 27.3* 23.5* 23.4*  MCV 90.9  --  90.6  --  92.9 89.0 92.1  PLT 213   < > 215   < > 235 255 249   < > = values in this interval not displayed.   Blood Culture    Component Value Date/Time   SDES  10/03/2019 0049    BLOOD RIGHT ARM Performed at [facility], [place] [address], [place], [place] [address]    SDES  10/03/2019 [phone]    BLOOD RIGHT HAND Performed at [facility], [place] [address] [place], [place] [address]    SPECREQUEST  10/03/2019 0049    BOTTLES DRAWN AEROBIC ONLY Blood Culture adequate volume Performed at [place]  [address], [place], [place] [address]    SPECREQUEST  10/03/2019 0049    BOTTLES DRAWN AEROBIC ONLY Blood Culture adequate volume Performed at [place] [address], [place], [place] [address]    CULT  10/03/2019 0049    NO GROWTH 5 DAYS Performed at [facility], [place] [address], [place], [place] [address]    CULT  10/03/2019 0049    NO GROWTH 5 DAYS Performed at [facility], [place] [address] [place], [place] [address]    REPTSTATUS 10/08/2019 FINAL 10/03/2019 0049   REPTSTATUS 10/08/2019 FINAL 10/03/2019 0049    Cardiac Enzymes: No results for input(s): CKTOTAL, CKMB, CKMBINDEX, TROPONINI in the last 168 hours. CBG: Recent Labs  Lab 10/11/19 0731 10/11/19 1212 10/11/19 1636 10/11/19 2041 10/12/19 0755  GLUCAP 178* 127* 97 93 90   Iron Studies: No results for input(s): IRON, TIBC, TRANSFERRIN, FERRITIN in the last 72 hours. @lablastinr3 @ Studies/Results: No results found. Medications: . piperacillin-tazobactam (ZOSYN)  IV 2.25 g (10/12/19 0455)   . sodium chloride   Intravenous Once  . Chlorhexidine Gluconate Cloth  6 each Topical Daily  . DULoxetine  20 mg Oral Daily  . feeding supplement (ENSURE ENLIVE)  237 mL Oral 4x daily  . fludrocortisone  0.1 mg Oral Daily  . furosemide  20 mg Oral Daily  . insulin aspart  0-5 Units Subcutaneous QHS  . insulin aspart  0-9 Units Subcutaneous TID WC  . insulin aspart  3 Units Subcutaneous TID WC  . insulin glargine  4 Units Subcutaneous QHS  . mouth rinse  15 mL Mouth Rinse BID  . midodrine  2.5 mg Oral TID WC  . pantoprazole  40 mg Oral BID AC  . pravastatin  40 mg Oral QHS

## 2019-10-13 LAB — CBC WITH DIFFERENTIAL/PLATELET
Abs Immature Granulocytes: 0.2 10*3/uL — ABNORMAL HIGH (ref 0.00–0.07)
Basophils Absolute: 0 10*3/uL (ref 0.0–0.1)
Basophils Relative: 0 %
Eosinophils Absolute: 0.1 10*3/uL (ref 0.0–0.5)
Eosinophils Relative: 1 %
HCT: 27.8 % — ABNORMAL LOW (ref 36.0–46.0)
Hemoglobin: 8.9 g/dL — ABNORMAL LOW (ref 12.0–15.0)
Immature Granulocytes: 1 %
Lymphocytes Relative: 15 %
Lymphs Abs: 2.4 10*3/uL (ref 0.7–4.0)
MCH: 29.1 pg (ref 26.0–34.0)
MCHC: 32 g/dL (ref 30.0–36.0)
MCV: 90.8 fL (ref 80.0–100.0)
Monocytes Absolute: 1.1 10*3/uL — ABNORMAL HIGH (ref 0.1–1.0)
Monocytes Relative: 7 %
Neutro Abs: 12.1 10*3/uL — ABNORMAL HIGH (ref 1.7–7.7)
Neutrophils Relative %: 76 %
Platelets: 260 10*3/uL (ref 150–400)
RBC: 3.06 MIL/uL — ABNORMAL LOW (ref 3.87–5.11)
RDW: 15.9 % — ABNORMAL HIGH (ref 11.5–15.5)
WBC: 15.9 10*3/uL — ABNORMAL HIGH (ref 4.0–10.5)
nRBC: 0.2 % (ref 0.0–0.2)

## 2019-10-13 LAB — BASIC METABOLIC PANEL
Anion gap: 10 (ref 5–15)
BUN: 70 mg/dL — ABNORMAL HIGH (ref 8–23)
CO2: 24 mmol/L (ref 22–32)
Calcium: 7.3 mg/dL — ABNORMAL LOW (ref 8.9–10.3)
Chloride: 106 mmol/L (ref 98–111)
Creatinine, Ser: 3.54 mg/dL — ABNORMAL HIGH (ref 0.44–1.00)
GFR calc Af Amer: 14 mL/min — ABNORMAL LOW (ref >60)
GFR calc non Af Amer: 12 mL/min — ABNORMAL LOW (ref >60)
Glucose, Bld: 114 mg/dL — ABNORMAL HIGH (ref 70–99)
Potassium: 3.7 mmol/L (ref 3.5–5.1)
Sodium: 140 mmol/L (ref 135–145)

## 2019-10-13 LAB — GLUCOSE, CAPILLARY
Glucose-Capillary: 114 mg/dL — ABNORMAL HIGH (ref 70–99)
Glucose-Capillary: 160 mg/dL — ABNORMAL HIGH (ref 70–99)
Glucose-Capillary: 157 mg/dL — ABNORMAL HIGH (ref 70–99)
Glucose-Capillary: 149 mg/dL — ABNORMAL HIGH (ref 70–99)

## 2019-10-13 LAB — SARS CORONAVIRUS 2 (TAT 6-24 HRS): SARS Coronavirus 2: NEGATIVE

## 2019-10-13 NOTE — Plan of Care (Signed)
  Problem: Urinary Elimination: Goal: Signs and symptoms of infection will decrease Outcome: Progressing   Problem: Education: Goal: Knowledge of General Education information will improve Description: Including pain rating scale, medication(s)/side effects and non-pharmacologic comfort measures Outcome: Completed/Met   Problem: Health Behavior/Discharge Planning: Goal: Ability to manage health-related needs will improve Outcome: Progressing   Problem: Clinical Measurements: Goal: Ability to maintain clinical measurements within normal limits will improve Outcome: Progressing Goal: Will remain free from infection Outcome: Progressing Goal: Diagnostic test results will improve Outcome: Progressing Goal: Respiratory complications will improve Outcome: Completed/Met Goal: Cardiovascular complication will be avoided Outcome: Progressing   Problem: Activity: Goal: Risk for activity intolerance will decrease Outcome: Progressing   Problem: Nutrition: Goal: Adequate nutrition will be maintained Outcome: Not Progressing   Problem: Coping: Goal: Level of anxiety will decrease Outcome: Progressing   Problem: Elimination: Goal: Will not experience complications related to bowel motility Outcome: Completed/Met Goal: Will not experience complications related to urinary retention Outcome: Completed/Met   Problem: Pain Managment: Goal: General experience of comfort will improve Outcome: Progressing   Problem: Safety: Goal: Ability to remain free from injury will improve Outcome: Progressing   Problem: Skin Integrity: Goal: Risk for impaired skin integrity will decrease Outcome: Progressing

## 2019-10-13 NOTE — Progress Notes (Signed)
Patient was turned and repositioned with pillows every two hours.

## 2019-10-13 NOTE — Progress Notes (Signed)
Pharmacy Antibiotic Note  Stephanie Olson is a 79 y.o. female admitted on 10/01/2019 with septic shock secondary to acute bilateral pyelonephritis complicated by nonfunctioning chronic indwelling Foley catheter. Patient's currently on zosyn for UTI and bacteremia.  Today, 10/13/19: -  D13/14 total Antibiotics - Afebrile, WBC elevated but trending down - SCr remains elevated 3.54, CrCl <32ml/min  Plan - Continue Zosyn 2.25g IV q8 to complete 14 days per ID (last day on 2/6) - Monitor renal function ___________________________________________  Height: 5\' 6"  (167.6 cm) Weight: 181 lb 10.5 oz (82.4 kg) IBW/kg (Calculated) : 59.3  Temp (24hrs), Avg:97.9 F (36.6 C), Min:97.6 F (36.4 C), Max:98.2 F (36.8 C)  Recent Labs  Lab 10/08/19 0532 10/08/19 1657 10/09/19 0517 10/09/19 0517 10/09/19 1636 10/10/19 0534 10/11/19 0508 10/12/19 0520 10/13/19 0420  WBC 20.7*   < > 29.9*   < > 30.5* 32.2* 27.7* 19.0* 15.9*  CREATININE 3.05*  --  3.16*  --   --  3.05* 3.40* 3.28* 3.54*  LATICACIDVEN 2.9*  --   --   --   --  1.5  --   --   --    < > = values in this interval not displayed.    Estimated Creatinine Clearance: 14.4 mL/min (A) (by C-G formula based on SCr of 3.54 mg/dL (H)).    No Known Allergies  Antimicrobials this admission: 1/24 Metronidazole x 1 1/24 Vancomycin >> 1/26 1/24 Cefepime >> 1/26 1/26 Zosyn >> (2/6)   Dose adjustments/drug levels this admission: 1/30: changed zosyn from 2.25g IV q6 to q8 due to continued rise in Scr   Microbiology results: 1/24 BCx: per BCID 4/4 bottles - providencia - resistant to amp, ancef, Cipro, gent, bactrim, Unasyn - proteus - pansensitive;  enterococcus faecalis - pansensitive 1/24 UCx: >100k proteus - resistant only to nitro and intermediate to impenem and providencia - MDR (resistant to amp, ancef, cipro, gent, nitro, bactrim, and Unasyn) 1/24 COVID: negative 1/24 Influenza A/B: negative 1/26 repeat BCx: ngF 1/24 MRSA PCR  neg   Thank you for allowing pharmacy to be a part of this patient's care.  Dia Sitter, PharmD, BCPS 10/13/2019 7:39 AM

## 2019-10-13 NOTE — Progress Notes (Signed)
Physical Therapy Treatment Patient Details Name: Stephanie Olson MRN: [id number] DOB: May 15, 1941 Today's Date: 10/13/2019    History of Present Illness 79 yo female admitted from SNF with severe sepsis, AMS. Hx of DM, neuropathy, chronic foley, CKD    PT Comments    Pt requiring max assist +2 for bed mobility and unable to stand today so performed lateral/scoot transfer to drop arm recliner.  Continue to recommend pt return to SNF upon d/c.  Follow Up Recommendations  SNF     Equipment Recommendations  None recommended by PT    Recommendations for Other Services       Precautions / Restrictions Precautions Precautions: Fall Precaution Comments: very HOH Restrictions Weight Bearing Restrictions: No    Mobility  Bed Mobility Overal bed mobility: Needs Assistance Bed Mobility: Supine to Sit     Supine to sit: Max assist;+2 for physical assistance     General bed mobility comments: multimodal cues for technique, pt requiring increased time and assist to complete  Transfers Overall transfer level: Needs assistance Equipment used: Rolling walker (2 wheeled) Transfers: Sit to/from [employer] Sit to Stand: Max assist;+2 physical assistance   Squat pivot transfers: Total assist;+2 physical assistance     General transfer comment: attempted standing with RW however pt unable to assist with rise, pt agreeable to attempt lateral/scoot to drop arm recliner however unable to scoot requiring increased assist  Ambulation/Gait                 Stairs             Wheelchair Mobility    Modified Rankin (Stroke Patients Only)       Balance Overall balance assessment: Needs assistance Sitting-balance support: Bilateral upper extremity supported;Feet supported Sitting balance-Leahy Scale: Fair Sitting balance - Comments: fair to poor, close supervision for safety                                    Cognition  Arousal/Alertness: Awake/alert Behavior During Therapy: Flat affect Overall Cognitive Status: History of cognitive impairments - at baseline                                 General Comments: following most one step commands consistently, with incr time      Exercises      General Comments        Pertinent Vitals/Pain Pain Assessment: No/denies pain    Home Living                      Prior Function            PT Goals (current goals can now be found in the care plan section) Progress towards PT goals: Progressing toward goals    Frequency    Min 2X/week      PT Plan Current plan remains appropriate    Co-evaluation              AM-PAC PT "6 Clicks" Mobility   Outcome Measure  Help needed turning from your back to your side while in a flat bed without using bedrails?: A Lot Help needed moving from lying on your back to sitting on the side of a flat bed without using bedrails?: A Lot Help needed moving to and from a bed to a chair (including a  wheelchair)?: Total Help needed standing up from a chair using your arms (e.g., wheelchair or bedside chair)?: Total Help needed to walk in hospital room?: Total Help needed climbing 3-5 steps with a railing? : Total 6 Click Score: 8    End of Session Equipment Utilized During Treatment: Gait belt Activity Tolerance: Patient limited by fatigue Patient left: in chair;with chair alarm set;with call bell/phone within reach Nurse Communication: Mobility status(maximove lift pad under pt, lift communicated on white board) PT Visit Diagnosis: Muscle weakness (generalized) (M62.81);Other abnormalities of gait and mobility (R26.89)     Time: [id number] PT Time Calculation (min) (ACUTE ONLY): 21 min  Charges:  $Therapeutic Activity: 8-22 mins                     Arlyce Dice, DPT Acute Rehabilitation Services Office: [phone]  Kerrilyn Azbill,KATHrine E 10/13/2019, 1:01 PM

## 2019-10-13 NOTE — Progress Notes (Signed)
[facility] Progress Note    Assessment/Plan **AKI on CKD:  Baseline renal function Cr 1.5 -2 and now into 3s in setting of septic shock and obstruction.  No contrast exposure, no NSAIDs.  No rashes or fevers to suggest AIN but time course raises the question; UA without WBC casts noted - I am OK to cont zosyn for now.  Creatinine slightly worse today at 3.54 today.  At this point would just continue supportive care to maintain euvolemia, avoid nephrotoxins.  She's net + 12L for admission and edematous so doing gentle diuresis with lasix 40 po for now.  Will just need time for recovery and may not achieve prior baseline.  She is a poor candidate for long term dialysis should need arise.   **Sepsis secondary to polymicrobial bacteremia and UTI:  Antibiotics per ID -- planning 14 d course zosyn.  Further improvement in leukocytosis today and remains afebrile. Per primary.   **HFrEF:  Cardiology following.  No BB and RAAS inhibition with low BP and CKD. Diuresed some 2/1 and 2/2 with lasix 40 IV x 1 dose each day but in light of rising Cr held diuresis yesterday.  Will give lasix 40 po again today; she was not on diuretic chronically PTA.  Low na diet, daily weights.   **ABLA secondary to presumed GIB:  Hb initially 10.1 now in the 7s.  No IV iron in setting of bacteremia.  Dose with aranesp 60 2/4, may need outpt.  Transfusion per primary.   **Adrenal insufficiency:  On florinef and midodrine.  In future would consider stress dose steroids if septic.   **h/o CVA: per primary.   **R complex cyst: stable, CTM outpt.   I will plan to f/u with her closely in clinic - I don't think she's quite ready for discharge but once off IV antibiotics if renal function still in mid 3s she could go.   [name] MD [facility] Pager: [phone]  ________________________________________________________________________  Subjective:   No complaints except  sleepy.   Objective Vitals:   10/12/19 1548 10/12/19 2111 10/13/19 0530 10/13/19 1320  BP: 101/67 98/61 127/82 104/63  Pulse: 99 95 95 (!) 109  Resp: 18 18 20 20   Temp: 97.9 F (36.6 C) 98.1 F (36.7 C) 98.2 F (36.8 C) 97.6 F (36.4 C)  TempSrc: Oral Oral Oral Oral  SpO2: 96% 90% 96% 93%  Weight:      Height:       Physical Exam General: elderly woman at 45 degrees, comfortable Heart: RRR Lungs: clear Abdomen: soft Extremities: no edema except LUE 1+ around PIV   Additional Objective Labs: Basic Metabolic Panel: Recent Labs  Lab 10/07/19 0412 10/07/19 2102 10/08/19 0532 10/09/19 0517 10/11/19 0508 10/12/19 0520 10/13/19 0420  NA 143  --  135   < > 141 141 140  K 4.2  --  3.9   < > 4.0 3.6 3.7  CL 115*  --  102   < > 105 104 106  CO2 18*  --  20*   < > 25 26 24   GLUCOSE 229*  --  378*   < > 202* 104* 114*  BUN 87*   < > 80*   < > 73* 75* 70*  CREATININE 3.10*  --  3.05*   < > 3.40* 3.28* 3.54*  CALCIUM 7.9*  --  7.5*   < > 7.3* 7.4* 7.3*  PHOS 3.4  --  3.3  --   --   --   --    < > =  values in this interval not displayed.   Liver Function Tests: Recent Labs  Lab 10/10/19 0534  AST 20  ALT 28  ALKPHOS 46  BILITOT 0.5  PROT 4.2*  ALBUMIN 1.6*   No results for input(s): LIPASE, AMYLASE in the last 168 hours. CBC: Recent Labs  Lab 10/09/19 1636 10/09/19 1636 10/10/19 0534 10/10/19 0534 10/11/19 0508 10/12/19 0520 10/13/19 0420  WBC 30.5*   < > 32.2*   < > 27.7* 19.0* 15.9*  NEUTROABS 24.8*   < > 27.4*   < > 22.9* 14.5* 12.1*  HGB 8.5*   < > 8.3*   < > 7.3* 7.0* 8.9*  HCT 27.1*   < > 27.3*   < > 23.5* 23.4* 27.8*  MCV 90.6  --  92.9  --  89.0 92.1 90.8  PLT 215   < > 235   < > 255 249 260   < > = values in this interval not displayed.   Blood Culture    Component Value Date/Time   SDES  10/03/2019 0049    BLOOD RIGHT ARM Performed at [facility], [place] [address], [place], [place] [address]    SDES  10/03/2019 [phone]     BLOOD RIGHT HAND Performed at [facility], [place] [address] [place], [place] [address]    SPECREQUEST  10/03/2019 0049    BOTTLES DRAWN AEROBIC ONLY Blood Culture adequate volume Performed at [place] [address], [place], [place] [address]    SPECREQUEST  10/03/2019 0049    BOTTLES DRAWN AEROBIC ONLY Blood Culture adequate volume Performed at [place] [address], [place], [place] [address]    CULT  10/03/2019 0049    NO GROWTH 5 DAYS Performed at [facility], [place] [address] [place], [place] [address]    CULT  10/03/2019 0049    NO GROWTH 5 DAYS Performed at [facility], [place] [address] [place], [place] [address]    REPTSTATUS 10/08/2019 FINAL 10/03/2019 0049   REPTSTATUS 10/08/2019 FINAL 10/03/2019 0049    Cardiac Enzymes: No results for input(s): CKTOTAL, CKMB, CKMBINDEX, TROPONINI in the last 168 hours. CBG: Recent Labs  Lab 10/12/19 1152 10/12/19 1624 10/12/19 2158 10/13/19 0748 10/13/19 1146  GLUCAP 113* 142* 110* 114* 160*   Iron Studies: No results for input(s): IRON, TIBC, TRANSFERRIN, FERRITIN in the last 72 hours. @lablastinr3 @ Studies/Results: No results found. Medications: . sodium chloride 250 mL (10/12/19 1636)  . piperacillin-tazobactam (ZOSYN)  IV 2.25 g (10/13/19 1331)   . Chlorhexidine Gluconate Cloth  6 each Topical Daily  . DULoxetine  20 mg Oral Daily  . feeding supplement (ENSURE ENLIVE)  237 mL Oral 4x daily  . fludrocortisone  0.1 mg Oral Daily  . insulin aspart  0-5 Units Subcutaneous QHS  . insulin aspart  0-9 Units Subcutaneous TID WC  . insulin aspart  3 Units Subcutaneous TID WC  . insulin glargine  4 Units Subcutaneous QHS  . mouth rinse  15 mL Mouth Rinse BID  . midodrine  2.5 mg Oral TID WC  . pantoprazole  40 mg Oral BID AC  . pravastatin  40 mg Oral QHS

## 2019-10-13 NOTE — TOC Progression Note (Signed)
Transition of Care [facility]) - Progression Note    Patient Details  Name: ALZORA EULBERG MRN: [id number] Date of Birth: [birth year]  Transition of Care [facility]) CM/SW Contact  Juliette Standre, Juliann Pulse, RN Phone Number: 10/13/2019, 11:38 AM  Clinical Narrative:  Yaak Irine Seal will need covid within 3days of d/c-covid needed-MD aware.     Expected Discharge Plan: Skilled Nursing Facility Barriers to Discharge: Continued Medical Work up  Expected Discharge Plan and Services Expected Discharge Plan: [place]   Discharge Planning Services: NA   Living arrangements for the past 2 months: [place]                   DME Agency: NA       HH Arranged: NA           Social Determinants of Health (SDOH) Interventions    Readmission Risk Interventions Readmission Risk Prevention Plan 10/04/2019  Transportation Screening Complete  Medication Review [employer]) Referral to Pharmacy  PCP or Specialist appointment within 3-5 days of discharge Complete  HRI or [place] Not Complete  HRI or Home Care Consult Pt Refusal Comments Patient is long term care at [facility] Complete  Some recent data might be hidden

## 2019-10-13 NOTE — Progress Notes (Signed)
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 presented to the emergency room with altered mental status and abdominal pain for 1 day.  Reportedly she is alert and oriented x 4 at base line and was willing to transfer back to assisted living from nursing home in near future.  In the emergency room, temperature 100.1.  Heart rate 114, respiratory 24, blood pressure 86/61.  On room air.  WBC count 14,000, procalcitonin 46, lactic acid 4.  Chest x-ray patchy bibasilar opacities, left greater than right.  CT abdomen pelvis with bilateral hydronephrosis with pyelonephritis and bladder distention.  Resuscitated, started on sepsis protocol and admitted to stepdown unit. 1/29: Mental status improving.  Started having maroon-colored stool with drop in hemoglobin, had EGD done showed non bleeding duodenal ulcer.  1/30: Mental status both are improving.  Renal functions fluctuate.  Foley functioning well and adequate urine output.  10/13/19: Seen and examined.  Denies any pain.  Very hard of hearing.  Afebrile overnight.  No acute events.  Last dose of IV antibiotic 10/14/2019.        Assessment/Plan: Principal Problem:   Severe sepsis (HCC) Active Problems:   Renal failure (ARF), acute on chronic (HCC)   Pressure injury of skin   Malnutrition of moderate degree   Lactic acidosis   Acute pyelonephritis   Hypotension   Hypokalemia   Chronic indwelling Foley catheter   Sinus tachycardia   Bacteremia due to Enterococcus   Bacteremia due to Gram-negative bacteria  Resolving severe sepsis secondary to polymicrobial bacteremia likely secondary to a urinary source Presented with leukocytosis, tachycardia, tachypnea with  positive urine analysis, urine culture, and blood cultures with significantly elevated procalcitonin 68 which is trending down to 6 >> 3.55 Blood cultures drawn on 10/01/2019 + for Proteus mirabilis, Providencia stuartii, Enterococcus faecalis.  Repeat blood cultures done on 10/03/2019 negative final. Seen by infectious disease with recommendation for 14 days of Zosyn. Day #12 of antibiotics. Continue midodrine 2.5 mg 3 times daily to maintain MAP greater than 65 Infectious disease followed Afebrile overnight.  No acute events.  Last dose of IV antibiotic 10/14/2019.  Polymicrobial bacteremia 2D echo was obtained in the setting of Enterococcus bacteremia, did not show any evidence of vegetation. Infectious disease followed and made recommendations as stated above.  Resolved Hypotension in the setting of sepsis Maintain MAP greater than 65 Continue midodrine 2.5 mg 3 times daily and florinef for adrenal insufficiency  Resolving acute metabolic encephalopathy in the setting of acute illness, previous CVA, and suspected underlying dementia CT head done on 10/03/2019 showed no acute intracranial abnormality. 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   Per medical record no prior history of dementia or CVA MRI brain showed Multiple old basal ganglia small vessel infarcts. Generalized atrophy and severe chronic small vessel ischemic microangiopathy. Continue to reorient as needed.  Continue fall precautions.  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 Good urine output 1.7 L recorded in the previous 24 hours. Continue management per nephrology.  Hypomagnesemia Mag 1.6 Replaced  Resolved Hypokalemia, likely contributed by bicarb Repleted  Steroid-induced hyperglycemia with labile  blood  sugars/hypoglycemia A1c 6.4 on 10/08/2019 Hold off insulin coverage due to hypoglycemia with serum blood glucose 48 on 10/09/2019.  Hypoglycemia corrected with D50 x 1 CBGs have been stable in the last 48 hours.  Chronic urinary retention s/p indwelling foley catheter, poa/complex cyst right kidney/Resolved b/l hydronephrosis Complex cyst in the upper pole of the right kidney stable from previous ultrasounds. B/L hydronephrosis seen on CT appears to be resolved on [place] C/w indwelling foley catheter, poa Will need to follow-up with urology  Resolved anion gap metabolic acidosis Received  bicarb replacement.  History of previous CVA, seen on CT head Continue pravastatin Continue to hold ASA and restart in 2 weeks per GI (Dr. Watt Climes 10/10/2019) LDL 95, goal less than 70 on 10/08/2019. Hemoglobin A1c 6.4 on 10/08/2019. Sinus tachycardia on last EKG Monitor on telemetry  Newly diagnosed systolic CHF 2D echo done to rule out endocarditis revealed LVEF 35 to 40% with severe left ventricular hypokinesis Seen by cardiology.  Signed off. Contact cardiology prior to dc. Continue to monitor volume status Continue strict I's and O's and daily weight Heart failure medications on hold due to hypotension.  H pylori infection Per GI: 1.  Follow up H. Pylori and treat (likely in a few weeks after she's over this acute hospitalization) if positive. 2.  Pantoprazole 40 mg po bid qac x 6 weeks, then 40 mg po qd qac thereafter indefinitely. Will need to follow-up with GI outpatient.  Prolonged QTC Avoid prolonging QTC agents Repeat twelve-lead EKG.  Chronic anxiety/depression Continue home medication   Adrenal insufficiency Continue Florinef and midodrine  Acute upper GI bleeding: Suspected upper GI bleeding with multiple episodes of maroon-colored stool.   Positive FOBT on 10/06/2019. She was also on steroids. Hemoglobin dropped from 9.6-8-7.5>>7.3>> 7.0 post 1 unit PRBC Hemoglobin now stable 8.9 on  10/13/2019. Continue p.o. Protonix 40 mg twice daily x6 weeks then daily per GI. H pylori positive  Acute blood loss anemia secondary to suspected upper GI bleed Management per above.  Physical debility PT assessment recommended SNF. TOC consulted to assist with SNF placement. Continue PT OT with assistance and fall precautions   DVT prophylaxis: SCDs Code Status: Full code  Consults: Cardiology, nephrology.  Family Communication:  Updated her son [name] 10/11/19   Disposition Plan: patient is from skilled nursing facility. Anticipated DC to skilled nursing facility likely tomorrow 10/14/2019.  Barrier to discharge: Finishing her last doses of IV antibiotics, End date 10/14/2019.       Objective: Vitals:   10/12/19 1548 10/12/19 2111 10/13/19 0530 10/13/19 1320  BP: 101/67 98/61 127/82 104/63  Pulse: 99 95 95 (!) 109  Resp: 18 18 20 20   Temp: 97.9 F (36.6 C) 98.1 F (36.7 C) 98.2 F (36.8 C) 97.6 F (36.4 C)  TempSrc: Oral Oral Oral Oral  SpO2: 96% 90% 96% 93%  Weight:      Height:        Intake/Output Summary (Last 24 hours) at 10/13/2019 1433 Last data filed at 10/13/2019 1000 Gross per 24 hour  Intake 1754.76 ml  Output 1700 ml  Net 54.76 ml   Filed Weights   10/09/19 0600 10/10/19 0500 10/11/19 0500  Weight: 82 kg 80.1 kg 82.4 kg    Exam:  . General: 79 y.o. year-old female pleasant well-developed well-nourished very hard of hearing.  No acute distress.   . Cardiovascular: Tachycardic no rubs or gallops.   Respiratory: Clear to auscultation, no rales, no wheezes are noted.  Poor  inspiratory effort. Abdomen: Obese nontender normal bowel sounds present.   Musculoskeletal: No lower extremity edema bilaterally. Psychiatry: Mood is appropriate for condition and setting.  Data Reviewed: CBC: Recent Labs  Lab 10/09/19 1636 10/10/19 0534 10/11/19 0508 10/12/19 0520 10/13/19 0420  WBC 30.5* 32.2* 27.7* 19.0* 15.9*  NEUTROABS 24.8* 27.4* 22.9* 14.5* 12.1*    HGB 8.5* 8.3* 7.3* 7.0* 8.9*  HCT 27.1* 27.3* 23.5* 23.4* 27.8*  MCV 90.6 92.9 89.0 92.1 90.8  PLT 215 235 255 249 [id number]   Basic Metabolic Panel: Recent Labs  Lab 10/07/19 0412 10/07/19 2102 10/08/19 0532 10/08/19 0532 10/09/19 0517 10/09/19 0517 10/09/19 1711 10/10/19 0534 10/11/19 0508 10/12/19 0520 10/13/19 0420  NA 143  --  135   < > 138  --   --  148* 141 141 140  K 4.2  --  3.9   < > 3.1*   < > 3.9 4.8 4.0 3.6 3.7  CL 115*  --  102   < > 102  --   --  110 105 104 106  CO2 18*  --  20*   < > 26  --   --  22 25 26 24   GLUCOSE 229*  --  378*   < > 48*  --   --  142* 202* 104* 114*  BUN 87*   < > 80*   < > 78*  --   --  62* 73* 75* 70*  CREATININE 3.10*  --  3.05*   < > 3.16*  --   --  3.05* 3.40* 3.28* 3.54*  CALCIUM 7.9*  --  7.5*   < > 7.4*  --   --  7.0* 7.3* 7.4* 7.3*  MG 2.0  --  1.8  --   --   --   --   --  1.6*  --   --   PHOS 3.4  --  3.3  --   --   --   --   --   --   --   --    < > = values in this interval not displayed.   GFR: Estimated Creatinine Clearance: 14.4 mL/min (A) (by C-G formula based on SCr of 3.54 mg/dL (H)). Liver Function Tests: Recent Labs  Lab 10/10/19 0534  AST 20  ALT 28  ALKPHOS 46  BILITOT 0.5  PROT 4.2*  ALBUMIN 1.6*   No results for input(s): LIPASE, AMYLASE in the last 168 hours. No results for input(s): AMMONIA in the last 168 hours. Coagulation Profile: Recent Labs  Lab 10/07/19 0412  INR 1.5*   Cardiac Enzymes: No results for input(s): CKTOTAL, CKMB, CKMBINDEX, TROPONINI in the last 168 hours. BNP (last 3 results) No results for input(s): PROBNP in the last 8760 hours. HbA1C: No results for input(s): HGBA1C in the last 72 hours. CBG: Recent Labs  Lab 10/12/19 1152 10/12/19 1624 10/12/19 2158 10/13/19 0748 10/13/19 1146  GLUCAP 113* 142* 110* 114* 160*   Lipid Profile: No results for input(s): CHOL, HDL, LDLCALC, TRIG, CHOLHDL, LDLDIRECT in the last 72 hours. Thyroid Function Tests: No results for  input(s): TSH, T4TOTAL, FREET4, T3FREE, THYROIDAB in the last 72 hours. Anemia Panel: No results for input(s): VITAMINB12, FOLATE, FERRITIN, TIBC, IRON, RETICCTPCT in the last 72 hours. Urine analysis:    Component Value Date/Time   COLORURINE YELLOW 10/09/2019 1403   APPEARANCEUR TURBID (A) 10/09/2019 1403   LABSPEC 1.008 10/09/2019 1403   PHURINE 5.0 10/09/2019 1403   GLUCOSEU NEGATIVE  10/09/2019 1403   HGBUR MODERATE (A) 10/09/2019 1403   BILIRUBINUR NEGATIVE 10/09/2019 1403   KETONESUR NEGATIVE 10/09/2019 1403   PROTEINUR NEGATIVE 10/09/2019 1403   UROBILINOGEN 0.2 03/03/2010 0808   NITRITE NEGATIVE 10/09/2019 1403   LEUKOCYTESUR LARGE (A) 10/09/2019 1403   Sepsis Labs: @LABRCNTIP (procalcitonin:4,lacticidven:4)  ) No results found for this or any previous visit (from the past 240 hour(s)).    Studies: No results found.  Scheduled Meds: . Chlorhexidine Gluconate Cloth  6 each Topical Daily  . DULoxetine  20 mg Oral Daily  . feeding supplement (ENSURE ENLIVE)  237 mL Oral 4x daily  . fludrocortisone  0.1 mg Oral Daily  . insulin aspart  0-5 Units Subcutaneous QHS  . insulin aspart  0-9 Units Subcutaneous TID WC  . insulin aspart  3 Units Subcutaneous TID WC  . insulin glargine  4 Units Subcutaneous QHS  . mouth rinse  15 mL Mouth Rinse BID  . midodrine  2.5 mg Oral TID WC  . pantoprazole  40 mg Oral BID AC  . pravastatin  40 mg Oral QHS    Continuous Infusions: . sodium chloride 250 mL (10/12/19 1636)  . piperacillin-tazobactam (ZOSYN)  IV 2.25 g (10/13/19 1331)     LOS: 12 days     Kayleen Memos, MD [facility] Pager [phone]  If 7PM-7AM, please contact night-coverage [email or web address] Password [email or web address] 10/13/2019, 2:33 PM

## 2019-10-14 LAB — CBC WITH DIFFERENTIAL/PLATELET
Abs Immature Granulocytes: 0.19 10*3/uL — ABNORMAL HIGH (ref 0.00–0.07)
Basophils Absolute: 0.1 10*3/uL (ref 0.0–0.1)
Basophils Relative: 0 %
Eosinophils Absolute: 0.1 10*3/uL (ref 0.0–0.5)
Eosinophils Relative: 1 %
HCT: 29.8 % — ABNORMAL LOW (ref 36.0–46.0)
Hemoglobin: 9.3 g/dL — ABNORMAL LOW (ref 12.0–15.0)
Immature Granulocytes: 1 %
Lymphocytes Relative: 18 %
Lymphs Abs: 2.7 10*3/uL (ref 0.7–4.0)
MCH: 28.8 pg (ref 26.0–34.0)
MCHC: 31.2 g/dL (ref 30.0–36.0)
MCV: 92.3 fL (ref 80.0–100.0)
Monocytes Absolute: 1.7 10*3/uL — ABNORMAL HIGH (ref 0.1–1.0)
Monocytes Relative: 11 %
Neutro Abs: 9.8 10*3/uL — ABNORMAL HIGH (ref 1.7–7.7)
Neutrophils Relative %: 69 %
Platelets: 252 10*3/uL (ref 150–400)
RBC: 3.23 MIL/uL — ABNORMAL LOW (ref 3.87–5.11)
RDW: 15.9 % — ABNORMAL HIGH (ref 11.5–15.5)
WBC: 14.5 10*3/uL — ABNORMAL HIGH (ref 4.0–10.5)
nRBC: 0 % (ref 0.0–0.2)

## 2019-10-14 LAB — BASIC METABOLIC PANEL
Anion gap: 12 (ref 5–15)
BUN: 69 mg/dL — ABNORMAL HIGH (ref 8–23)
CO2: 24 mmol/L (ref 22–32)
Calcium: 7.3 mg/dL — ABNORMAL LOW (ref 8.9–10.3)
Chloride: 102 mmol/L (ref 98–111)
Creatinine, Ser: 3.48 mg/dL — ABNORMAL HIGH (ref 0.44–1.00)
GFR calc Af Amer: 14 mL/min — ABNORMAL LOW (ref >60)
GFR calc non Af Amer: 12 mL/min — ABNORMAL LOW (ref >60)
Glucose, Bld: 136 mg/dL — ABNORMAL HIGH (ref 70–99)
Potassium: 3.3 mmol/L — ABNORMAL LOW (ref 3.5–5.1)
Sodium: 138 mmol/L (ref 135–145)

## 2019-10-14 LAB — GLUCOSE, CAPILLARY
Glucose-Capillary: 121 mg/dL — ABNORMAL HIGH (ref 70–99)
Glucose-Capillary: 149 mg/dL — ABNORMAL HIGH (ref 70–99)

## 2019-10-14 LAB — IRON AND TIBC
Iron: 23 ug/dL — ABNORMAL LOW (ref 28–170)
Saturation Ratios: 12 % (ref 10.4–31.8)
TIBC: 200 ug/dL — ABNORMAL LOW (ref 250–450)
UIBC: 177 ug/dL

## 2019-10-14 LAB — PROCALCITONIN: Procalcitonin: 1.13 ng/mL

## 2019-10-14 MED ORDER — PANTOPRAZOLE SODIUM 40 MG PO TBEC: 40.0000 mg | DELAYED_RELEASE_TABLET | Freq: Two times a day (BID) | ORAL | 0 refills | Status: DC

## 2019-10-14 MED ORDER — PANTOPRAZOLE SODIUM 40 MG PO TBEC: 40.0000 mg | DELAYED_RELEASE_TABLET | Freq: Every day | ORAL | 0 refills | Status: AC

## 2019-10-14 MED ORDER — POTASSIUM CHLORIDE CRYS ER 20 MEQ PO TBCR
40.0000 meq | EXTENDED_RELEASE_TABLET | Freq: Once | ORAL | Status: AC
  Administered 2019-10-14: 40 meq via ORAL
  Filled 2019-10-14: qty 2

## 2019-10-14 MED ORDER — PANTOPRAZOLE SODIUM 40 MG PO TBEC: 40.0000 mg | DELAYED_RELEASE_TABLET | Freq: Two times a day (BID) | ORAL | 0 refills | Status: AC

## 2019-10-14 MED ORDER — FLUDROCORTISONE ACETATE 0.1 MG PO TABS: 0.1000 mg | ORAL_TABLET | Freq: Every day | ORAL | 0 refills | Status: AC

## 2019-10-14 MED ORDER — INSULIN GLARGINE 100 UNIT/ML SOLOSTAR PEN: 4.0000 [IU] | PEN_INJECTOR | Freq: Every day | SUBCUTANEOUS | 0 refills | Status: AC

## 2019-10-14 MED ORDER — ENSURE ENLIVE PO LIQD: 237.0000 mL | Freq: Four times a day (QID) | ORAL | 0 refills | Status: AC

## 2019-10-14 MED ORDER — ASPIRIN 81 MG PO CHEW: 81.0000 mg | CHEWABLE_TABLET | Freq: Every day | ORAL | 0 refills | Status: AC

## 2019-10-14 MED ORDER — MIDODRINE HCL 2.5 MG PO TABS: 2.5000 mg | ORAL_TABLET | Freq: Three times a day (TID) | ORAL | 0 refills | Status: AC

## 2019-10-14 MED ORDER — PRAVASTATIN SODIUM 40 MG PO TABS: 40.0000 mg | ORAL_TABLET | Freq: Every day | ORAL | 0 refills | Status: AC

## 2019-10-14 MED ORDER — ZINC OXIDE 40 % EX OINT
TOPICAL_OINTMENT | Freq: Three times a day (TID) | CUTANEOUS | Status: DC | PRN
  Filled 2019-10-14: qty 57

## 2019-10-14 NOTE — Discharge Summary (Addendum)
Discharge Summary  Stephanie Olson YSA:[id number] DOB: July 25, 1941  PCP: Benito Mccreedy, MD  Admit date: 10/01/2019 Discharge date: 10/14/2019  Time spent: 35 minutes  Recommendations for Outpatient Follow-up:  1. Follow-up with infectious disease. 2. Follow-up with cardiology. 3. Follow-up with nephrology. 4. Follow-up with urology. 5. Follow-up with neurology. 6. Follow-up with your PCP. 7. Follow up with GI. 8. Take your medications as prescribed.  9. Recommend foley exchange monthly. 10. Please encourage feeding with feeding assistance. 11. Fall precautions.  Discharge Diagnoses:  Active Hospital Problems   Diagnosis Date Noted  . Severe sepsis ([place]) 10/01/2019  . Bacteremia due to Enterococcus 10/09/2019  . Bacteremia due to Gram-negative bacteria 10/09/2019  . Acute pyelonephritis 10/01/2019  . Hypotension 10/01/2019  . Hypokalemia 10/01/2019  . Chronic indwelling Foley catheter 10/01/2019  . Sinus tachycardia 10/01/2019  . Lactic acidosis   . Malnutrition of moderate degree 04/25/2018  . Pressure injury of skin 04/23/2018  . Renal failure (ARF), acute on chronic (HCC) 04/22/2018    Resolved Hospital Problems  No resolved problems to display.    Discharge Condition: Stable   Diet recommendation:  Recommendations  Diet recommendations: Dysphagia 3 (mechanical soft);Thin liquid Liquids provided via: Cup;Straw Medication Administration: Crushed with puree Supervision: Patient able to self feed;Full supervision/cueing for compensatory strategies Compensations: Slow rate;Small sips/bites;Follow solids with liquid Postural Changes and/or Swallow Maneuvers: Seated upright 90 degrees;Upright 30-60 min after meal               Oral Care Recommendations: Oral care BID Follow up Recommendations: Skilled Nursing facility SLP Visit Diagnosis: Dysphagia, oropharyngeal phase (R13.12) Plan: All goals met     Vitals:   10/14/19 0426 10/14/19 1244    BP: 100/62 119/89  Pulse: 91 94  Resp:  19  Temp: 98.2 F (36.8 C) 98.2 F (36.8 C)  SpO2: 97% 99%    History of present illness:  79 year old female with history of 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  In the emergency room, temperature 100.1. Heart rate 114, respiratory 24, blood pressure 86/61. On room air. WBC count 14,000, procalcitonin 46, lactic acid 4. Chest x-ray showed patchy bibasilar opacities, left greater than right. CT abdomen pelvis with bilateral hydronephrosis with pyelonephritis and bladder distention. Resuscitated, started on sepsis protocol and admitted to stepdown unit.  Hospital course complicated by polymicrobial bacteremia likely 2/2 to urinary source.  ID followed and provided recommendations. 1/29: Mental status improving. Started having maroon-colored stool with drop in hemoglobin, had EGD done showed non bleeding duodenal ulcer.  Was started on PPI BID. 1/30: Mental status still improving. Renal functions fluctuate.   Nephrology was consulted.  Newly diagnosed acute systolic CHF for which cardiology was consulted.  Due to low blood pressures and AKI she was not placed on advanced heart failure medications.  10/14/19: Seen and examined.  No acute events overnight.  She has no new complaints.  Vital signs and labs stable.  Completing her last dose of IV antibiotic Zosyn.   Hospital Course:  Principal Problem:   Severe sepsis ([place]) Active Problems:   Renal failure (ARF), acute on chronic (HCC)   Pressure injury of skin   Malnutrition of moderate degree   Lactic acidosis   Acute pyelonephritis   Hypotension   Hypokalemia   Chronic indwelling Foley catheter   Sinus tachycardia   Bacteremia due  to Enterococcus   Bacteremia due to Gram-negative  bacteria  Resolved severe sepsis secondary to polymicrobial bacteremia likely secondary to a urinary source Presented with leukocytosis, tachycardia, tachypnea with positive urine analysis, urine culture, and blood cultures with significantly elevated procalcitonin 68 which is trending down to 6 >> 3.55>> 1.13 on 10/14/2019. Blood cultures drawn on 10/01/2019 + for Proteus mirabilis, Providencia stuartii, Enterococcus faecalis.  Repeat blood cultures done on 10/03/2019 negative final. Seen by infectious disease with recommendation for 14 days of Zosyn. Day #14. Continue midodrine 2.5 mg 3 times daily to maintain MAP greater than 65 Infectious disease followed Afebrile overnight.  No acute events.  Last dose of IV antibiotic 10/14/2019. Follow-up with infectious disease outpatient.  Polymicrobial bacteremia 2D echo was obtained in the setting of Enterococcus bacteremia, did not show any evidence of vegetation. Infectious disease followed and made recommendations as stated above. Follow-up with infectious disease outpatient.  Resolved Hypotension in the setting of sepsis Maintain MAP greater than 65 Continue midodrine 2.5 mg 3 times daily and florinef for adrenal insufficiency Follow up with your PCP or endocrinologist.  Resolved acute metabolic encephalopathy in the setting of acute illness, previous CVA, and suspected underlying dementia CT head done on 10/03/2019 showed no acute intracranial abnormality. 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   Per medical record no prior history of dementia or CVA MRI brain showed Multiple old basal ganglia small vessel infarcts. Generalized atrophy and severe chronic small vessel ischemic microangiopathy. Continue to reorient as needed.  Continue fall precautions.  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 Follow up with nephrology  Hypomagnesemia/hypokalemia Mag 1.6, K+ 3.3 Replaced Follow up with your PCP Repeat BMP on 10/18/19   Steroid-induced hyperglycemia with labile blood sugars/hypoglycemia A1c 6.4 on 10/08/2019 Continue lantus 4 U qhs Avoid hypoglycemia F/u with your PCP  Chronic urinary retention s/p indwelling foley catheter, poa/complex cyst right kidney/Resolved b/l hydronephrosis Complex cyst in the upper pole of the right kidney stable from previous ultrasounds. B/L hydronephrosis seen on CT appears to be resolved on [place] C/w indwelling foley catheter, poa Follow-up with urology  Resolved anion gap metabolic acidosis Received  bicarb replacement.  History of previous CVA, seen on CT head Continue pravastatin 40 mg daily Continue to hold ASA and restart in 2 weeks per GI (Dr. Watt Climes 10/10/2019) LDL 95, goal less than 70 on 10/08/2019. Hemoglobin A1c 6.4 on 10/08/2019. Follow up with your PCP or [facility]  Newly diagnosed systolic CHF 2D echo done to rule out endocarditis revealed LVEF 35 to 40% with severe left ventricular hypokinesis Seen by cardiology.   Follow up with cardiology  H pylori infection Per GI: 1.  Follow up H. Pylori and treat (likely in a few weeks after she's over this acute hospitalization) if positive. 2. Pantoprazole 40 mg po bid qac x 6 weeks, then 40 mg po qd qac thereafter indefinitely. Follow-up with GI outpatient.  Prolonged QTC Avoid prolonging QTC agents Follow up with cardiology.  Chronic anxiety/depression Continue home medication  Follow up with your PCP  Adrenal insufficiency Continue Florinef and midodrine Follow up with your PCP  Acute upper GI bleeding post EGD 10/06/19 Dr. Watt Climes: Erosive gastropathy with no stigmata of recent bleeding. - Non-bleeding 2duodenal ulcers with pigmented material and formed clot. - Normal second  portion of the duodenum and third portion of the duodenum. Continue p.o. Protonix 40 mg twice daily x6 weeks then daily per GI. H pylori positive Follow up with GI  Acute blood loss anemia secondary to suspected upper GI bleed Management per above. H&H stable and trending up 9.3 from 8.9 No signs of overt bleeding  Physical debility PT assessment recommended SNF. Continue PT OT with assistance and fall precautions    Code Status:Full code  Consults: Cardiology, nephrology, GI.    Discharge Exam: BP 119/89 (BP Location: Left Arm)   Pulse 94   Temp 98.2 F (36.8 C) (Oral)   Resp 19   Ht '5\' 6"'$  (1.676 m)   Wt 82.4 kg   SpO2 99%   BMI 29.32 kg/m  . General: 79 y.o. year-old female well developed well nourished in no acute distress.  Alert and interactive. . Cardiovascular: Regular rate and rhythm with no rubs or gallops.  No thyromegaly or JVD noted.   Marland Kitchen Respiratory: Clear to auscultation with no wheezes or rales. Good inspiratory effort. . Abdomen: Soft nontender nondistended with normal bowel sounds x4 quadrants. . Musculoskeletal: No lower extremity edema. 2/4 pulses in all 4 extremities. Marland Kitchen Psychiatry: Mood is appropriate for condition and setting  Discharge Instructions You were cared for by a hospitalist during your hospital stay. If you have any questions about your discharge medications or the care you received while you were in the hospital after you are discharged, you can call the unit and asked to speak with the hospitalist on call if the hospitalist that took care of you is not available. Once you are discharged, your primary care physician will handle any further medical issues. Please note that NO REFILLS for any discharge medications will be authorized once you are discharged, as it is imperative that you return to your primary care physician (or establish a relationship with a primary care physician if you do not have one) for your aftercare needs so that  they can reassess your need for medications and monitor your lab values.   Allergies as of 10/14/2019   No Known Allergies     Medication List    STOP taking these medications   HYDROcodone-acetaminophen 5-325 MG tablet Commonly known as: NORCO/VICODIN   ondansetron 4 MG tablet Commonly known as: ZOFRAN   sodium bicarbonate 650 MG tablet   sodium zirconium cyclosilicate 5 g packet Commonly known as: LOKELMA     TAKE these medications   acetaminophen 500 MG tablet Commonly known as: TYLENOL Take 500 mg by mouth 2 (two) times daily as needed for mild pain.   aspirin 81 MG chewable tablet Chew 1 tablet (81 mg total) by mouth daily. Start taking on: October 24, 2019 What changed: These instructions start on October 24, 2019. If you are unsure what to do until then, ask your doctor or other care provider.   bisacodyl 10 MG suppository Commonly known as: DULCOLAX Place 1 suppository (10 mg total) rectally daily as needed for moderate constipation.   cyanocobalamin 1000 MCG tablet Take 1 tablet (1,000 mcg total) by mouth daily.   dicyclomine 10 MG capsule Commonly known as: BENTYL Take 10 mg by mouth 2 (two) times daily.   DULoxetine 20 MG capsule Commonly known as: CYMBALTA Take 20 mg by mouth daily.   feeding supplement (ENSURE ENLIVE) Liqd Take 237 mLs by mouth in the morning, at noon, in the evening, and at bedtime for 7 days.   feeding supplement (PRO-STAT SUGAR FREE  64) Liqd Take 30 mLs by mouth 2 (two) times daily.   fludrocortisone 0.1 MG tablet Commonly known as: FLORINEF Take 1 tablet (0.1 mg total) by mouth daily.   folic acid 1 MG tablet Commonly known as: FOLVITE Take 1 tablet (1 mg total) by mouth daily.   Insulin Glargine 100 UNIT/ML Solostar Pen Commonly known as: LANTUS Inject 4 Units into the skin daily.   Lidocaine 4 % Ptch Apply 1 patch topically as directed.   Melatonin 3 MG Tabs Take 3 mg by mouth at bedtime.   midodrine 2.5 MG  tablet Commonly known as: PROAMATINE Take 1 tablet (2.5 mg total) by mouth 3 (three) times daily with meals.   pantoprazole 40 MG tablet Commonly known as: PROTONIX Take 1 tablet (40 mg total) by mouth 2 (two) times daily before a meal. Take po protonix 40 mg BID x 6 weeks, then take po protonix 40 mg daily indefinitely.   pantoprazole 40 MG tablet Commonly known as: Protonix Take 1 tablet (40 mg total) by mouth daily. After completing 6 weeks of po protonix 40 mg BID, start po protonix 40 mg daily indefinitely. Start taking on: November 29, 2019   polyvinyl alcohol 1.4 % ophthalmic solution Commonly known as: LIQUIFILM TEARS Place 1 drop into both eyes 2 (two) times daily.   pravastatin 40 MG tablet Commonly known as: PRAVACHOL Take 1 tablet (40 mg total) by mouth at bedtime. What changed:   medication strength  how much to take   senna-docusate 8.6-50 MG tablet Commonly known as: Senokot-S Take 1 tablet by mouth 2 (two) times daily.   tamsulosin 0.4 MG Caps capsule Commonly known as: FLOMAX Take 0.4 mg by mouth daily.      No Known Allergies  Contact information for follow-up providers    Benito Mccreedy, MD. Call in 1 day(s).   Specialty: Internal Medicine Why: Please call for a post hospital follow-up appointment. Contact information: [place] [address] [phone]        Sanda Klein, MD .   Specialty: Cardiology Contact information: [address] [place] [address] [phone]        Justin Mend, MD. Call in 1 day(s).   Specialty: Internal Medicine Why: Please call for a post hospital follow-up appointment. Contact information: [address] [place] [address] [phone]        Michel Bickers, MD. Call in 1 day(s).   Specialty: Infectious Diseases Why: Please call for a post hospital follow-up appointment. Contact information: [address] [place]  [address] [phone]        Lucas Mallow, MD. Call in 1 day(s).   Specialty: Urology Why: Please call for a post hospital follow-up appointment. Contact information: [place] [address] [phone]        Clarene Essex, MD. Call in 1 day(s).   Specialty: Gastroenterology Why: Please call for a post hospital follow-up appointment. Contact information: [address] [place] [address] [phone]        [facility]. Call in 1 day(s).   Why: Please call for a post hospital follow up appointment. Contact information: [address]     [place] [address] [phone]           Contact information for after-discharge care    Destination    [facility] .   Service: Skilled Nursing Contact information:  [place] [phone]                   The results of significant diagnostics from this hospitalization (including imaging, microbiology, ancillary and laboratory) are listed below for reference.    Significant Diagnostic Studies: CT ABDOMEN PELVIS WO CONTRAST  Result Date: 10/01/2019 CLINICAL DATA:  Sepsis, altered mental status, abdominal pain. EXAM: CT ABDOMEN AND PELVIS WITHOUT CONTRAST TECHNIQUE: Multidetector CT imaging of the abdomen and pelvis was performed following the standard protocol without IV contrast. COMPARISON:  CT abdomen dated 04/23/2019. FINDINGS: Lower chest: Patchy bibasilar consolidations, LEFT greater than RIGHT. Hepatobiliary: No focal liver abnormality is seen. Gallbladder is again moderately distended containing small layering gallstones. No pericholecystic fluid. No obvious bile duct dilatation. Pancreas: Unremarkable. No pancreatic ductal dilatation or surrounding inflammatory changes. Spleen: Normal in size without focal abnormality. Adrenals/Urinary Tract: Bilateral hydronephrosis,  moderate in degree, RIGHT greater than LEFT, with associated perinephric and periureteral fluid stranding bilaterally. Bladder is distended. Foley catheter is present within the bladder. Stomach/Bowel: Moderate-sized stool ball in the rectal vault. No dilated large or small bowel loops. No evidence of bowel wall inflammation. Stomach is unremarkable, partially decompressed. Vascular/Lymphatic: Aortic atherosclerosis. No enlarged lymph nodes seen in the abdomen or pelvis. Reproductive: No adnexal mass seen. Other: No abscess collection or free intraperitoneal air. Musculoskeletal: Degenerative spondylosis throughout the slightly scoliotic thoracolumbar spine. No acute or suspicious osseous finding. IMPRESSION: 1. Bilateral hydronephrosis, moderate in degree, RIGHT greater than LEFT, with associated perinephric and periureteral fluid stranding bilaterally. Bladder is distended despite the presence of a Foley catheter within the bladder. Findings are suspicious for bladder outlet obstruction causing the bilateral hydronephrosis. Given the degree of perinephric and periureteral inflammation/fluid stranding, this is likely acute and and associated ascending ureteral infection and/or pyelonephritis cannot be excluded. 2. Patchy bibasilar consolidations, LEFT greater than RIGHT, suspicious for multifocal pneumonia. COVID-19? 3. Cholelithiasis without evidence of acute cholecystitis. 4. Moderate-sized stool ball in the rectal vault. No bowel obstruction. Electronically Signed   By: Franki Cabot M.D.   On: 10/01/2019 14:06   CT HEAD WO CONTRAST  Result Date: 10/03/2019 CLINICAL DATA:  Altered mental status EXAM: CT HEAD WITHOUT CONTRAST TECHNIQUE: Contiguous axial images were obtained from the base of the skull through the vertex without intravenous contrast. COMPARISON:  2019 FINDINGS: Brain: There is no acute intracranial hemorrhage, mass-effect, or edema. Gray-white differentiation is preserved. There is no  extra-axial fluid collection. Confluent areas of hypoattenuation in the supratentorial white matter are nonspecific but probably reflects stable chronic microvascular ischemic changes. Prominence of the ventricles and sulci reflects stable parenchymal volume loss. 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 Vascular: There is atherosclerotic calcification at the skull base. Skull: Calvarium is unremarkable. Sinuses/Orbits: No acute finding. Other: None. IMPRESSION: No acute intracranial abnormality. Stable chronic findings detailed above. Electronically Signed   By: Macy Mis M.D.   On: 10/03/2019 10:45   MR BRAIN WO CONTRAST  Result Date: 10/09/2019 CLINICAL DATA:  Encephalopathy EXAM: MRI HEAD WITHOUT CONTRAST TECHNIQUE: Multiplanar, multiecho pulse sequences of the brain and surrounding structures were obtained without intravenous contrast. COMPARISON:  Head CT 10/03/2019 FINDINGS: BRAIN: No acute infarct, acute hemorrhage or extra-axial collection. Diffuse confluent hyperintense T2-weighted signal within the periventricular, deep and juxtacortical white matter, most commonly due to chronic ischemic microangiopathy. There is generalized atrophy without lobar predilection. Midline structures are normal. There are multiple old supratentorial  small vessel infarcts VASCULAR: Major flow voids are preserved. Susceptibility-sensitive sequences show no chronic microhemorrhage or superficial siderosis. SKULL AND UPPER CERVICAL SPINE: Normal calvarium and skull base. Visualized upper cervical spine and soft tissues are normal. SINUSES/ORBITS: No fluid levels or advanced mucosal thickening. No mastoid or middle ear effusion. Normal orbits. IMPRESSION: 1. No acute intracranial process. 2. 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 3. Generalized atrophy and severe chronic small vessel ischemic microangiopathy. Electronically Signed   By: Ulyses Jarred M.D.   On: 10/09/2019 22:30   US RENAL  Result Date: 10/05/2019 CLINICAL DATA:  Follow-up collecting system dilatation on recent CT EXAM: RENAL / URINARY TRACT ULTRASOUND COMPLETE COMPARISON:  09/30/2018 FINDINGS: Right Kidney: Renal measurements: 9.2 x 4.3 x 4.3 cm. = volume: 89 mL. 2 cm cyst is noted within the upper pole of the right kidney with some slight increased echogenicity within. This has been seen on previous ultrasound examinations. Left Kidney: Renal measurements: 8.3 x 4.2 x 5.0 cm. = volume: 92 mL. Echogenicity within normal limits. No mass or hydronephrosis visualized. Bladder: Decompressed by Foley catheter. Other: None. IMPRESSION: Previously seen dilatation of the collecting systems and ureters bilaterally has resolved consistent with decompression of the bladder by the Foley catheter. The previously seen dilatation is likely related to the overly distended bladder. Complex cyst in the upper pole of the right kidney stable from previous ultrasounds. Electronically Signed   By: [name] M.D.   On: 10/05/2019 08:39   DG CHEST PORT 1 VIEW  Result Date: 10/04/2019 CLINICAL DATA:  Pneumonia EXAM: PORTABLE CHEST 1 VIEW COMPARISON:  09/30/2018 FINDINGS: Right much greater than left pulmonary opacities. No significant pleural effusion. No pneumothorax. Stable cardiomediastinal contours. IMPRESSION: Increased right much greater than left pulmonary opacities, which may reflect edema or pneumonia. Electronically Signed   By: Macy Mis M.D.   On: 10/04/2019 11:21   DG Chest Port 1 View  Result Date: 10/01/2019 CLINICAL DATA:  Altered mental status, diabetes mellitus, hypertension, smoker EXAM: PORTABLE CHEST 1 VIEW COMPARISON:  Portable exam 1229 hours compared to 04/23/2018 FINDINGS: Normal heart size, mediastinal contours, and pulmonary vascularity. Mild atelectasis versus infiltrate at LEFT base. Remaining lungs clear. No pleural effusion or pneumothorax. Bones demineralized  with chronic RIGHT rotator cuff tear noted. IMPRESSION: Mild atelectasis versus infiltrate at LEFT base. Electronically Signed   By: Lavonia Dana M.D.   On: 10/01/2019 12:45   ECHOCARDIOGRAM COMPLETE  Result Date: 10/03/2019   ECHOCARDIOGRAM REPORT   Patient Name:   ANANDI ABRAMO [facility] Date of Exam: 10/03/2019 Medical Rec #:  [id number]        Height:       66.0 in Accession #:    [id number]       Weight:       149.3 lb Date of Birth:  1941/03/01        BSA:          1.77 m Patient Age:    18 years         BP:           103/52 mmHg Patient Gender: F                HR:           100 bpm. Exam Location:  Inpatient Procedure: 2D Echo, Cardiac Doppler and Color Doppler Indications:    Bacteremia 790.7 / R78.81  History:        Patient has no prior history of  Echocardiogram examinations.                 Risk Factors:Hypertension and Diabetes.  Sonographer:    Jonelle Sidle Dance Referring Phys: [id number] [place]  1. Left ventricular ejection fraction, by visual estimation, is 35 to 40%. The left ventricle has moderately decreased function. There is no left ventricular hypertrophy.  2. Severe hypokinesis of the left ventricular, entire apical segment.  3. Severe hypokinesis of the left ventricular, entire inferior wall and inferolateral wall.  4. Indeterminate diastolic filling due to E-A fusion.  5. The left ventricle demonstrates regional wall motion abnormalities.  6. Global right ventricle has normal systolic function.The right ventricular size is normal. No increase in right ventricular wall thickness.  7. Left atrial size was normal.  8. Right atrial size was normal.  9. Mild mitral annular calcification. 10. The mitral valve is normal in structure. Mild mitral valve regurgitation. 11. The tricuspid valve is normal in structure. 12. The tricuspid valve is normal in structure. Tricuspid valve regurgitation is not demonstrated. 13. The aortic valve is tricuspid. Aortic valve regurgitation is not visualized.  Mild aortic valve sclerosis without stenosis. 14. The pulmonic valve was normal in structure. Pulmonic valve regurgitation is not visualized. FINDINGS  Left Ventricle: Left ventricular ejection fraction, by visual estimation, is 35 to 40%. The left ventricle has moderately decreased function. Severe hypokinesis of the left ventricular, entire inferior wall and inferolateral wall. Severe hypokinesis of the left ventricular, entire apical segment. The left ventricle demonstrates regional wall motion abnormalities. The left ventricular internal cavity size was the left ventricle is normal in size. There is no left ventricular hypertrophy. Indeterminate diastolic filling due to E-A fusion. Right Ventricle: The right ventricular size is normal. No increase in right ventricular wall thickness. Global RV systolic function is has normal systolic function. Left Atrium: Left atrial size was normal in size. Right Atrium: Right atrial size was normal in size Pericardium: There is no evidence of pericardial effusion. Mitral Valve: The mitral valve is normal in structure. Mild mitral annular calcification. Mild mitral valve regurgitation, with centrally-directed jet. Tricuspid Valve: The tricuspid valve is normal in structure. Tricuspid valve regurgitation is not demonstrated. Aortic Valve: The aortic valve is tricuspid. . There is mild thickening and moderate calcification of the aortic valve. Aortic valve regurgitation is not visualized. Aortic regurgitation PHT measures 265 msec. Mild aortic valve sclerosis is present, with  no evidence of aortic valve stenosis. There is mild thickening of the aortic valve. There is moderate calcification of the aortic valve. Pulmonic Valve: The pulmonic valve was normal in structure. Pulmonic valve regurgitation is not visualized. Pulmonic regurgitation is not visualized. Aorta: The aortic root and ascending aorta are structurally normal, with no evidence of dilitation. IAS/Shunts: No atrial  level shunt detected by color flow Doppler.  LEFT VENTRICLE PLAX 2D LVIDd:         4.20 cm LVIDs:         3.30 cm LV PW:         1.20 cm LV IVS:        0.90 cm LVOT diam:     2.00 cm LV SV:         34 ml LV SV Index:   19.32 LVOT Area:     3.14 cm  RIGHT VENTRICLE            IVC RV Basal diam:  2.20 cm    IVC diam: 1.70 cm RV S prime:  8.27 cm/s TAPSE (M-mode): 1.1 cm LEFT ATRIUM             Index       RIGHT ATRIUM          Index LA diam:        3.70 cm 2.10 cm/m  RA Area:     8.05 cm LA Vol (A2C):   47.8 ml 27.07 ml/m RA Volume:   14.90 ml 8.44 ml/m LA Vol (A4C):   38.6 ml 21.86 ml/m LA Biplane Vol: 43.5 ml 24.63 ml/m  AORTIC VALVE LVOT Vmax:   72.15 cm/s LVOT Vmean:  45.550 cm/s LVOT VTI:    0.142 m AI PHT:      265 msec  AORTA Ao Root diam: 3.20 cm Ao Asc diam:  3.40 cm MITRAL VALVE MV Area (PHT): 7.66 cm             SHUNTS MV PHT:        28.71 msec           Systemic VTI:  0.14 m MV Decel Time: 99 msec              Systemic Diam: 2.00 cm MV E velocity: 135.00 cm/s 103 cm/s  Mihai Croitoru MD Electronically signed by Sanda Klein MD Signature Date/Time: 10/03/2019/10:28:12 AM    Final     Microbiology: Recent Results (from the past 240 hour(s))  SARS CORONAVIRUS 2 (TAT 6-24 HRS) Nasopharyngeal Nasopharyngeal Swab     Status: None   Collection Time: 10/13/19 12:48 PM   Specimen: Nasopharyngeal Swab  Result Value Ref Range Status   SARS Coronavirus 2 NEGATIVE NEGATIVE Final    Comment: (NOTE) SARS-CoV-2 target nucleic acids are NOT DETECTED. The SARS-CoV-2 RNA is generally detectable in upper and lower respiratory specimens during the acute phase of infection. Negative results do not preclude SARS-CoV-2 infection, do not rule out co-infections with other pathogens, and should not be used as the sole basis for treatment or other patient management decisions. Negative results must be combined with clinical observations, patient history, and epidemiological information. The  expected result is Negative. Fact Sheet for Patients: SugarRoll.be Fact Sheet for Healthcare Providers: https://www.woods-mathews.com/ This test is not yet approved or cleared by the [place] FDA and  has been authorized for detection and/or diagnosis of SARS-CoV-2 by FDA under an Emergency Use Authorization (EUA). This EUA will remain  in effect (meaning this test can be used) for the duration of the COVID-19 declaration under Section 56 4(b)(1) of the Act, 21 U.S.C. section 360bbb-3(b)(1), unless the authorization is terminated or revoked sooner. Performed at [facility], [place] [address], [place], [place] [address]      Labs: Basic Metabolic Panel: Recent Labs  Lab 10/08/19 0532 10/09/19 4854 10/10/19 0534 10/11/19 0508 10/12/19 0520 10/13/19 0420 10/14/19 0456  NA 135   < > 148* 141 141 140 138  K 3.9   < > 4.8 4.0 3.6 3.7 3.3*  CL 102   < > 110 105 104 106 102  CO2 20*   < > '22 25 26 24 24  '$ GLUCOSE 378*   < > 142* 202* 104* 114* 136*  BUN 80*   < > 62* 73* 75* 70* 69*  CREATININE 3.05*   < > 3.05* 3.40* 3.28* 3.54* 3.48*  CALCIUM 7.5*   < > 7.0* 7.3* 7.4* 7.3* 7.3*  MG 1.8  --   --  1.6*  --   --   --  PHOS 3.3  --   --   --   --   --   --    < > = values in this interval not displayed.   Liver Function Tests: Recent Labs  Lab 10/10/19 0534  AST 20  ALT 28  ALKPHOS 46  BILITOT 0.5  PROT 4.2*  ALBUMIN 1.6*   No results for input(s): LIPASE, AMYLASE in the last 168 hours. No results for input(s): AMMONIA in the last 168 hours. CBC: Recent Labs  Lab 10/10/19 0534 10/11/19 0508 10/12/19 0520 10/13/19 0420 10/14/19 0456  WBC 32.2* 27.7* 19.0* 15.9* 14.5*  NEUTROABS 27.4* 22.9* 14.5* 12.1* 9.8*  HGB 8.3* 7.3* 7.0* 8.9* 9.3*  HCT 27.3* 23.5* 23.4* 27.8* 29.8*  MCV 92.9 89.0 92.1 90.8 92.3  PLT 235 255 249 260 252   Cardiac Enzymes: No results for input(s): CKTOTAL, CKMB, CKMBINDEX, TROPONINI in the  last 168 hours. BNP: BNP (last 3 results) Recent Labs    10/10/19 0534  BNP 2,366.0*    ProBNP (last 3 results) No results for input(s): PROBNP in the last 8760 hours.  CBG: Recent Labs  Lab 10/13/19 1146 10/13/19 1639 10/13/19 2035 10/14/19 0800 10/14/19 1141  GLUCAP 160* 157* 149* 121* 149*       Signed:  Kayleen Memos, MD [facility] 10/14/2019, 1:52 PM

## 2019-10-14 NOTE — Progress Notes (Signed)
Report called to [place] at [facility].  Did report to Jeani Hawking that the foley was placed on 10/09/2019 and would be due to be changed 11/06/2019.

## 2019-10-14 NOTE — Discharge Instructions (Signed)
Urinary Tract Infection, Adult A urinary tract infection (UTI) is an infection of any part of the urinary tract. The urinary tract includes:  The kidneys.  The ureters.  The bladder.  The urethra. These organs make, store, and get rid of pee (urine) in the body. What are the causes? This is caused by germs (bacteria) in your genital area. These germs grow and cause swelling (inflammation) of your urinary tract. What increases the risk? You are more likely to develop this condition if:  You have a small, thin tube (catheter) to drain pee.  You cannot control when you pee or poop (incontinence).  You are female, and: ? You use these methods to prevent pregnancy:  A medicine that kills sperm (spermicide).  A device that blocks sperm (diaphragm). ? You have low levels of a female hormone (estrogen). ? You are pregnant.  You have genes that add to your risk.  You are sexually active.  You take antibiotic medicines.  You have trouble peeing because of: ? A prostate that is bigger than normal, if you are female. ? A blockage in the part of your body that drains pee from the bladder (urethra). ? A kidney stone. ? A nerve condition that affects your bladder (neurogenic bladder). ? Not getting enough to drink. ? Not peeing often enough.  You have other conditions, such as: ? Diabetes. ? A weak disease-fighting system (immune system). ? Sickle cell disease. ? Gout. ? Injury of the spine. What are the signs or symptoms? Symptoms of this condition include:  Needing to pee right away (urgently).  Peeing often.  Peeing small amounts often.  Pain or burning when peeing.  Blood in the pee.  Pee that smells bad or not like normal.  Trouble peeing.  Pee that is cloudy.  Fluid coming from the vagina, if you are female.  Pain in the belly or lower back. Other symptoms include:  Throwing up (vomiting).  No urge to eat.  Feeling mixed up (confused).  Being  tired and grouchy (irritable).  A fever.  Watery poop (diarrhea). How is this treated? This condition may be treated with:  Antibiotic medicine.  Other medicines.  Drinking enough water. Follow these instructions at home:  Medicines  Take over-the-counter and prescription medicines only as told by your doctor.  If you were prescribed an antibiotic medicine, take it as told by your doctor. Do not stop taking it even if you start to feel better. General instructions  Make sure you: ? Pee until your bladder is empty. ? Do not hold pee for a long time. ? Empty your bladder after sex. ? Wipe from front to back after pooping if you are a female. Use each tissue one time when you wipe.  Drink enough fluid to keep your pee pale yellow.  Keep all follow-up visits as told by your doctor. This is important. Contact a doctor if:  You do not get better after 1-2 days.  Your symptoms go away and then come back. Get help right away if:  You have very bad back pain.  You have very bad pain in your lower belly.  You have a fever.  You are sick to your stomach (nauseous).  You are throwing up. Summary  A urinary tract infection (UTI) is an infection of any part of the urinary tract.  This condition is caused by germs in your genital area.  There are many risk factors for a UTI. These include having a small,  thin tube to drain pee and not being able to control when you pee or poop.  Treatment includes antibiotic medicines for germs.  Drink enough fluid to keep your pee pale yellow. This information is not intended to replace advice given to you by your health care provider. Make sure you discuss any questions you have with your health care provider. Document Revised: 08/11/2018 Document Reviewed: 03/03/2018 Elsevier Patient Education  [place]. Bacteremia, Adult Bacteremia is the presence of bacteria in the blood. When bacteria enter the bloodstream, they can  cause a life-threatening reaction called sepsis. Sepsis is a medical emergency. What are the causes? This condition is caused by bacteria that get into the blood. Bacteria can enter the blood from an infection, including:  A skin infection or injury, such as a burn or a cut.  A lung infection (pneumonia).  An infection in the stomach or intestines.  An infection in the bladder or urinary system (urinary tract infection).  A bacterial infection in another part of the body that spreads to the blood. Bacteria can also enter the blood during a dental or medical procedure, from bleeding gums, or through use of an unclean needle. What increases the risk? This condition is more likely to develop in children, older adults, and people who have:  A long-term (chronic) disease or condition like diabetes or chronic kidney failure.  An artificial joint or heart valve, or heart valve disease.  A tube inserted to treat a medical condition, such as a urinary catheter or IV.  A weak disease-fighting system (immune system).  Injected illegal drugs.  Been hospitalized for more than 10 days in a row. What are the signs or symptoms? Symptoms of this condition include:  Fever and chills.  Fast heartbeat and shortness of breath.  Dizziness, weakness, and low blood pressure.  Confusion or anxiety.  Pain in the abdomen, nausea, vomiting, and diarrhea. Bacteremia that has spread to other parts of the body may cause symptoms in those areas. In some cases, there are no symptoms. How is this diagnosed? This condition may be diagnosed with a physical exam and tests, such as:  Blood tests to check for bacteria (cultures) or other signs of infection.  Tests of any tubes that you have had inserted. These tests check for a source of infection.  Urine tests to check for bacteria in the urine.  Imaging tests, such as an X-ray, a CT scan, an MRI, or a heart ultrasound. These check for a source of  infection in other parts of your body, such as your lungs, heart valves, or joints. How is this treated? This condition is usually treated in the hospital. If you are treated at home, you may need to return to the hospital for medicines, blood tests, and evaluation. Treatment may include:  Antibiotic medicines. These may be given by mouth or directly into your blood through an IV. You may need antibiotics for several weeks. At first, you may be given an antibiotic to kill most types of blood bacteria. If tests show that a certain kind of bacteria is causing the problem, you may be given a different antibiotic.  IV fluids.  Removing any catheter or device that could be a source of infection.  Blood pressure and breathing support, if needed.  Surgery to control the source or the spread of infection, such as surgery to remove an implanted device, abscess, or infected tissue. Follow these instructions at home: Medicines  Take over-the-counter and prescription medicines only  as told by your health care provider.  If you were prescribed an antibiotic medicine, take it as told by your health care provider. Do not stop taking the antibiotic even if you start to feel better. General instructions   Rest as needed. Ask your health care provider when you may return to normal activities.  Drink enough fluid to keep your urine pale yellow.  Do not use any products that contain nicotine or tobacco, such as cigarettes, e-cigarettes, and chewing tobacco. If you need help quitting, ask your health care provider.  Keep all follow-up visits as told by your health care provider. This is important. How is this prevented?   Wash your hands regularly with soap and water. If soap and water are not available, use hand sanitizer.  You should wash your hands: ? After using the toilet or changing a diaper. ? Before preparing, cooking, serving, or eating food. ? While caring for a sick person or while  visiting someone in a hospital. ? Before and after changing bandages (dressings) over wounds.  Clean any scrapes or cuts with soap and water and cover them with a clean bandage.  Get vaccinations as recommended by your health care provider.  Practice good oral hygiene. Brush your teeth two times a day, and floss regularly.  Take good care of your skin. This includes bathing and moisturizing on a regular basis. Contact a health care provider if:  Your symptoms get worse, and medicines do not help.  You have severe pain. Get help right away if you have:  Pain.  A fever or chills.  Trouble breathing.  A fast heart rate.  Skin that is blotchy, pale, or clammy.  Confusion.  Weakness.  Lack of energy or unusual sleepiness.  New symptoms that develop after treatment has started. These symptoms may represent a serious problem that is an emergency. Do not wait to see if the symptoms will go away. Get medical help right away. Call your local emergency services (911 in the [place] Do not drive yourself to the hospital. Summary  Bacteremia is the presence of bacteria in the blood. When bacteria enter the bloodstream, they can cause a life-threatening reaction called sepsis.  Bacteremia is usually treated with antibiotic medicines in the hospital.  If you were prescribed an antibiotic medicine, take it as told by your health care provider. Do not stop taking the antibiotic even if you start to feel better.  Get help right away if you have any new symptoms that develop after treatment has started. This information is not intended to replace advice given to you by your health care provider. Make sure you discuss any questions you have with your health care provider. Document Revised: 01/13/2019 Document Reviewed: 01/13/2019 Elsevier Patient Education  [place].

## 2019-10-14 NOTE — Progress Notes (Signed)
Attempted to call report to [facility] x 3, no answer.  [facility] here to transport patient. Will continue to attempt to contact [facility]

## 2019-10-14 NOTE — Progress Notes (Signed)
[facility] Progress Note    Assessment/Plan **AKI on CKD:  Baseline renal function Cr 1.5 -2 and now into 3s in setting of septic shock and obstruction.  No contrast exposure, no NSAIDs.  No rashes or fevers to suggest AIN.   Creatinine stable in mid 3s now. Will just need time for recovery and may not achieve prior baseline.  She is a poor candidate for long term dialysis should need arise.   **Sepsis secondary to polymicrobial bacteremia and UTI:  s/p course of zosyn.   **HFrEF:  Cardiology following.  No BB and RAAS inhibition with low BP and CKD. No diuretics at discharge. Low na diet.  **ABLA secondary to presumed GIB:  Hb initially 10.1 now in the 9s.  No IV iron in setting of bacteremia - will check indices for use on follow up.  Dose with aranesp 60 2/4, may need outpt.  Transfusion per primary.   **Adrenal insufficiency:  On florinef and midodrine.  In future would consider stress dose steroids if septic.   **h/o CVA: per primary.   **R complex cyst: stable, CTM outpt.   I will ask my office to have a RFP and CBC checked at her SNF in 2 weeks and I will plan to f/u with her in about 6-8 weeks at clinic.    Stephanie Hick MD [facility] Pager: [phone]  ________________________________________________________________________  Subjective:   No complaints except sleepy. D/c today  Objective Vitals:   10/13/19 1320 10/13/19 2031 10/14/19 0426 10/14/19 1244  BP: 104/63 99/69 100/62 119/89  Pulse: (!) 109 96 91 94  Resp: 20 18  19   Temp: 97.6 F (36.4 C) 97.9 F (36.6 C) 98.2 F (36.8 C) 98.2 F (36.8 C)  TempSrc: Oral Oral Oral Oral  SpO2: 93% 97% 97% 99%  Weight:      Height:       Physical Exam General: elderly woman at 45 degrees, comfortable Heart: RRR Lungs: clear Abdomen: soft Extremities: no edema except LUE 1+ around PIV   Additional Objective Labs: Basic Metabolic Panel: Recent Labs  Lab  10/08/19 0532 10/09/19 0517 10/12/19 0520 10/13/19 0420 10/14/19 0456  NA 135   < > 141 140 138  K 3.9   < > 3.6 3.7 3.3*  CL 102   < > 104 106 102  CO2 20*   < > 26 24 24   GLUCOSE 378*   < > 104* 114* 136*  BUN 80*   < > 75* 70* 69*  CREATININE 3.05*   < > 3.28* 3.54* 3.48*  CALCIUM 7.5*   < > 7.4* 7.3* 7.3*  PHOS 3.3  --   --   --   --    < > = values in this interval not displayed.   Liver Function Tests: Recent Labs  Lab 10/10/19 0534  AST 20  ALT 28  ALKPHOS 46  BILITOT 0.5  PROT 4.2*  ALBUMIN 1.6*   No results for input(s): LIPASE, AMYLASE in the last 168 hours. CBC: Recent Labs  Lab 10/10/19 0534 10/10/19 0534 10/11/19 0508 10/11/19 0508 10/12/19 0520 10/13/19 0420 10/14/19 0456  WBC 32.2*   < > 27.7*   < > 19.0* 15.9* 14.5*  NEUTROABS 27.4*   < > 22.9*   < > 14.5* 12.1* 9.8*  HGB 8.3*   < > 7.3*   < > 7.0* 8.9* 9.3*  HCT 27.3*   < > 23.5*   < > 23.4* 27.8* 29.8*  MCV 92.9  --  89.0  --  92.1 90.8 92.3  PLT 235   < > 255   < > 249 260 252   < > = values in this interval not displayed.   Blood Culture    Component Value Date/Time   SDES  10/03/2019 0049    BLOOD RIGHT ARM Performed at [facility], [place] [address], [place], [place] [address]    SDES  10/03/2019 [phone]    BLOOD RIGHT HAND Performed at [facility], [place] [address], [place], [place] [address]    SPECREQUEST  10/03/2019 0049    BOTTLES DRAWN AEROBIC ONLY Blood Culture adequate volume Performed at [facility] [address] [place], [place] [address]    SPECREQUEST  10/03/2019 0049    BOTTLES DRAWN AEROBIC ONLY Blood Culture adequate volume Performed at [place] [address] [place], [place] [address]    CULT  10/03/2019 0049    NO GROWTH 5 DAYS Performed at [facility], [place] [address], [place], [place] [address]    CULT  10/03/2019 0049    NO GROWTH 5 DAYS Performed at [facility], [place] [address] [place], [place] [address]    REPTSTATUS 10/08/2019 FINAL 10/03/2019 0049   REPTSTATUS 10/08/2019 FINAL 10/03/2019 0049    Cardiac Enzymes: No results for input(s): CKTOTAL, CKMB, CKMBINDEX, TROPONINI in the last 168 hours. CBG: Recent Labs  Lab 10/13/19 1146 10/13/19 1639 10/13/19 2035 10/14/19 0800 10/14/19 1141  GLUCAP 160* 157* 149* 121* 149*   Iron Studies: No results for input(s): IRON, TIBC, TRANSFERRIN, FERRITIN in the last 72 hours. @lablastinr3 @ Studies/Results: No results found. Medications: . sodium chloride 250 mL (10/12/19 1636)  . piperacillin-tazobactam (ZOSYN)  IV 2.25 g (10/14/19 0525)   . Chlorhexidine Gluconate Cloth  6 each Topical Daily  . DULoxetine  20 mg Oral Daily  . feeding supplement (ENSURE ENLIVE)  237 mL Oral 4x daily  . fludrocortisone  0.1 mg Oral Daily  . insulin aspart  0-5 Units Subcutaneous QHS  . insulin aspart  0-9 Units Subcutaneous TID WC  . insulin aspart  3 Units Subcutaneous TID WC  . insulin glargine  4 Units Subcutaneous QHS  . mouth rinse  15 mL Mouth Rinse BID  . midodrine  2.5 mg Oral TID WC  . pantoprazole  40 mg Oral BID AC  . pravastatin  40 mg Oral QHS

## 2019-10-16 LAB — TYPE AND SCREEN
ABO/RH(D): O POS
Antibody Screen: NEGATIVE
Unit division: 0
Unit division: 0

## 2019-10-16 LAB — BPAM RBC
Blood Product Expiration Date: 202103032359
Blood Product Expiration Date: 202103032359
ISSUE DATE / TIME: 202102041218
Unit Type and Rh: 5100
Unit Type and Rh: 5100

## 2019-11-06 DEATH — deceased

## 2019-11-23 LAB — ALDOSTERONE + RENIN ACTIVITY W/ RATIO
ALDO / PRA Ratio: 4 (ref 0.0–30.0)
Aldosterone: 1.5 ng/dL (ref 0.0–30.0)
PRA LC/MS/MS: 0.378 ng/mL/hr (ref 0.167–5.380)

## 2020-10-09 ENCOUNTER — Other Ambulatory Visit: Payer: Self-pay | Admitting: Internal Medicine

## 2020-10-09 DIAGNOSIS — N179 Acute kidney failure, unspecified: Secondary | ICD-10-CM

## 2021-05-15 IMAGING — US US RENAL
1 series · 14 of 25 positions shown · non-contrast
Comparison: 04/22/2018

CLINICAL DATA: Acute kidney injury.  Hydronephrosis.

EXAM:
RENAL / URINARY TRACT ULTRASOUND COMPLETE

[Series 1: us renal · 14 of 46 slices shown]
[im 1/46]
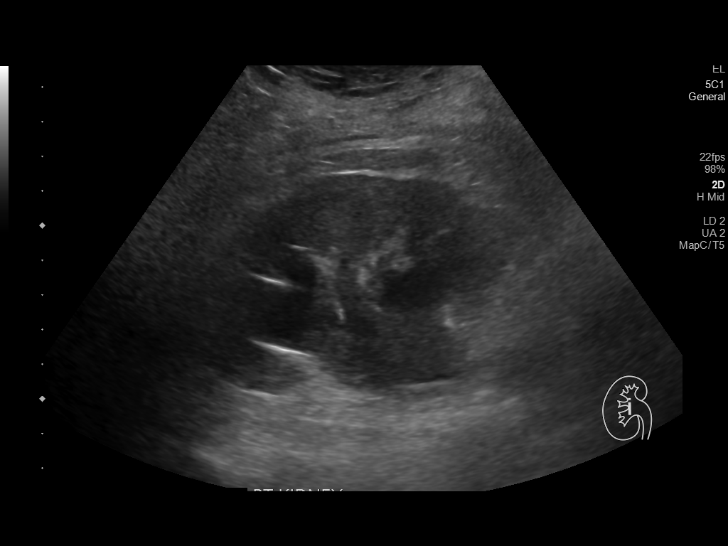
[im 4/46]
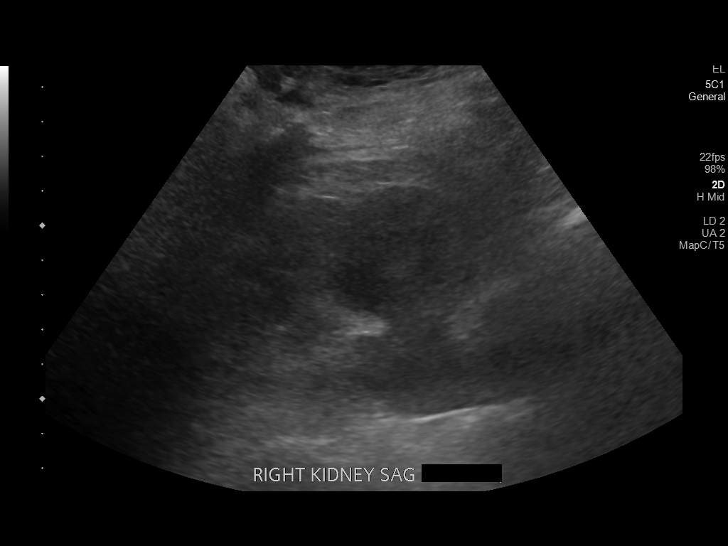
[im 8/46]
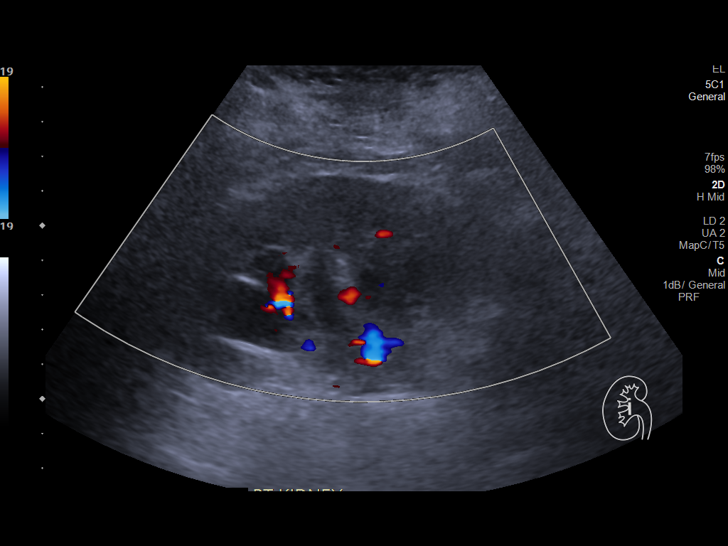
[im 12/46]
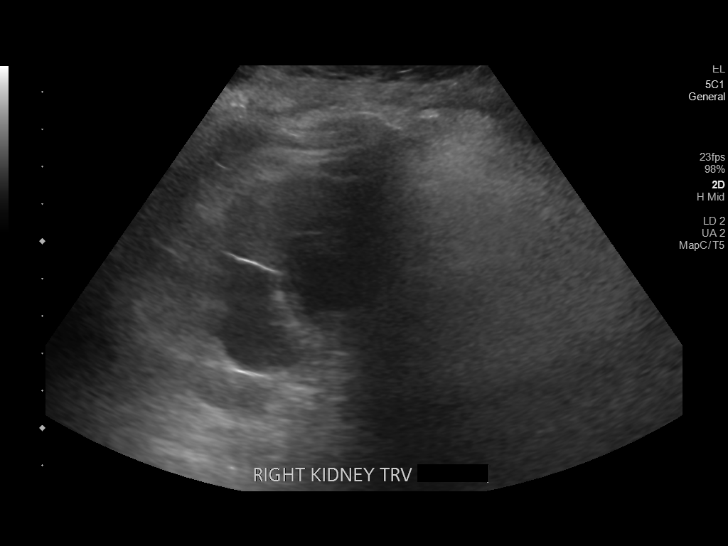
[im 16/46]
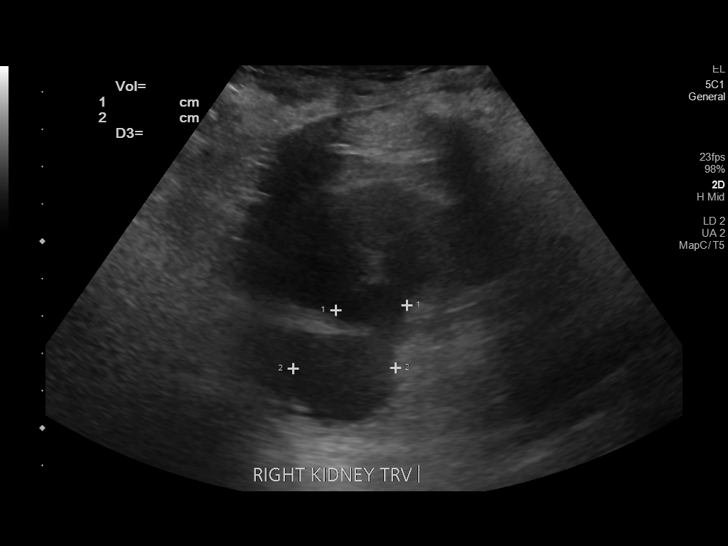
[im 17/46]
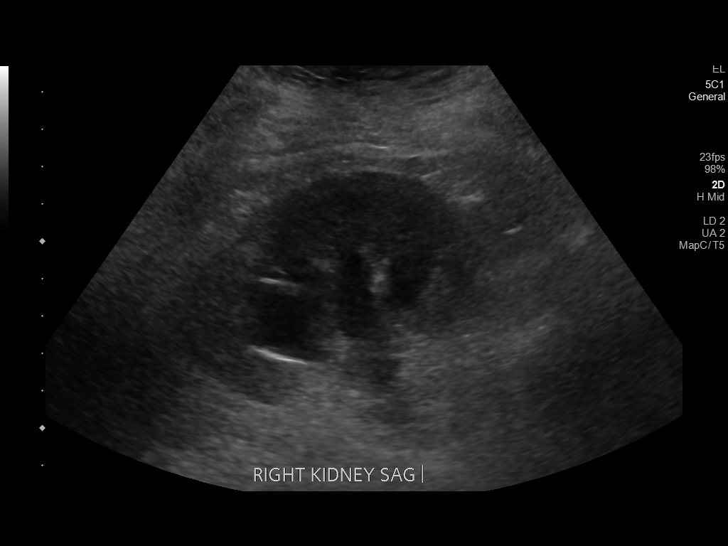
[im 21/46]
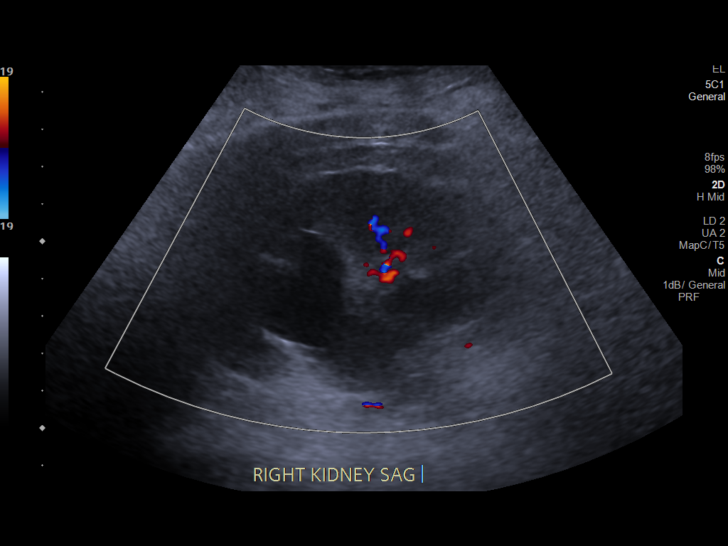
[im 25/46]
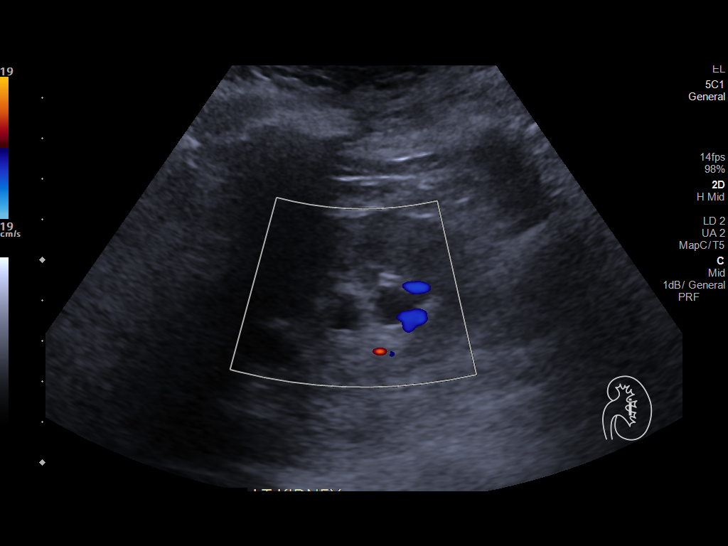
[im 29/46]
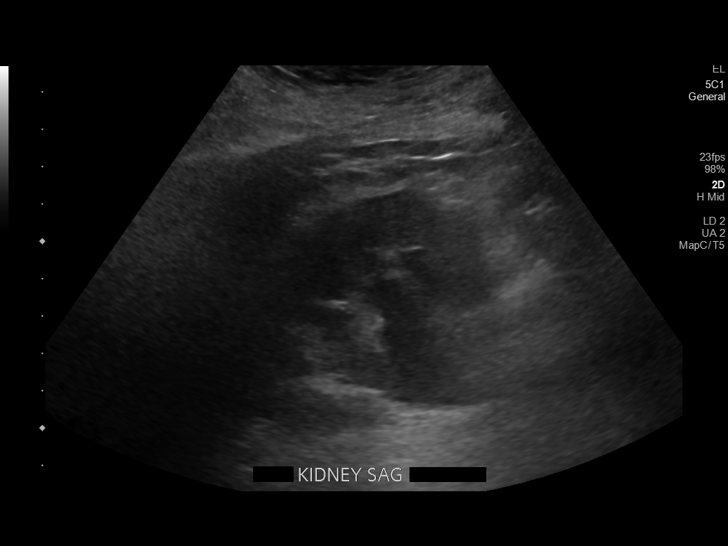
[im 31/46]
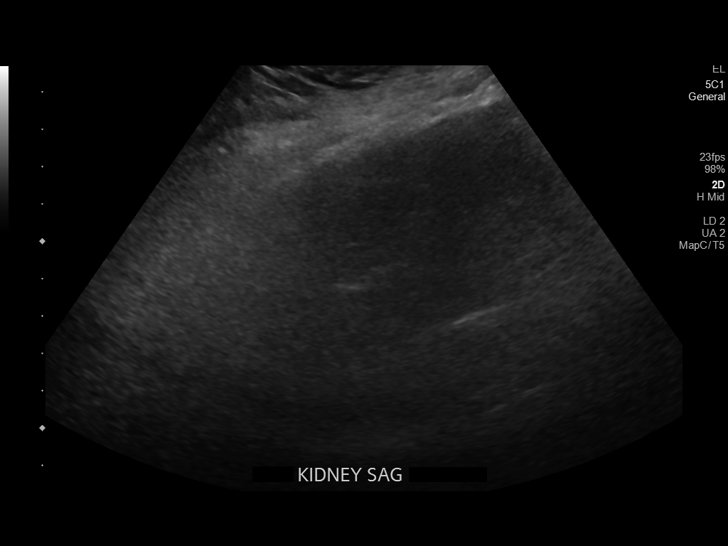
[im 34/46]
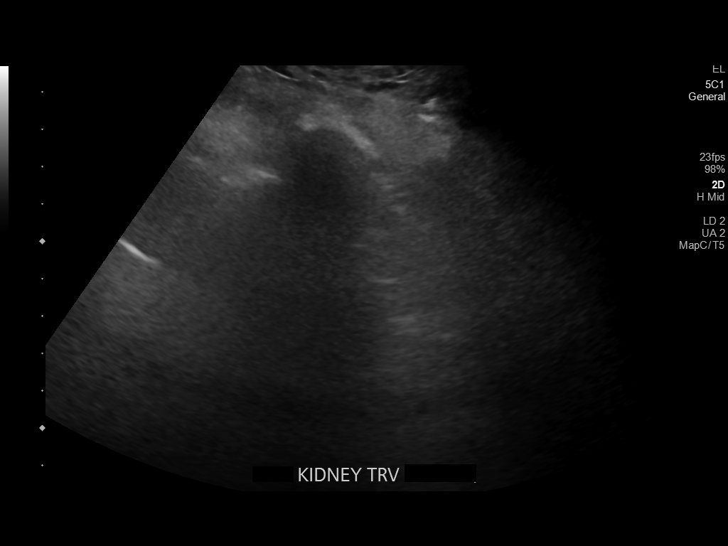
[im 38/46]
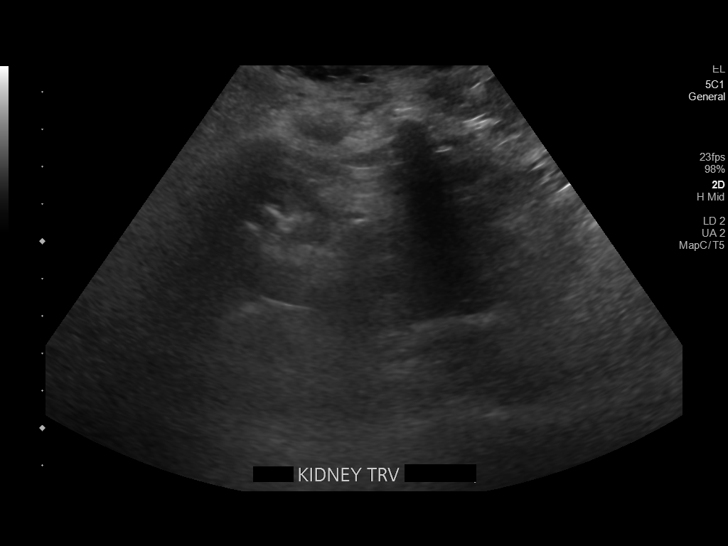
[im 42/46]
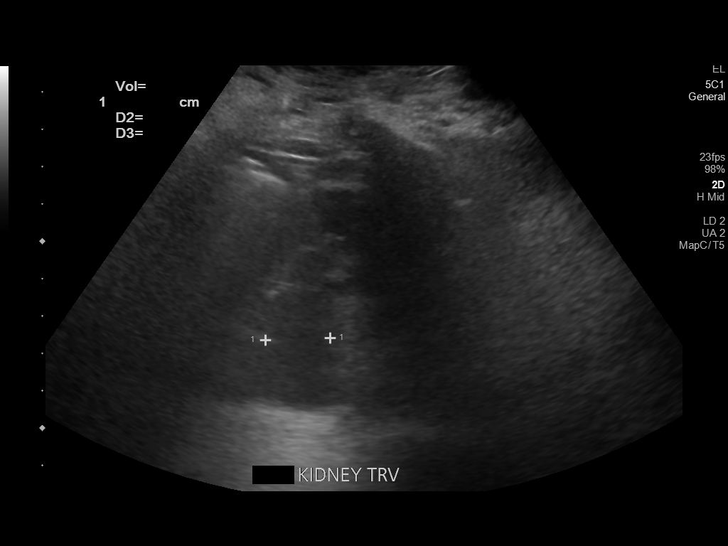
[im 46/46]
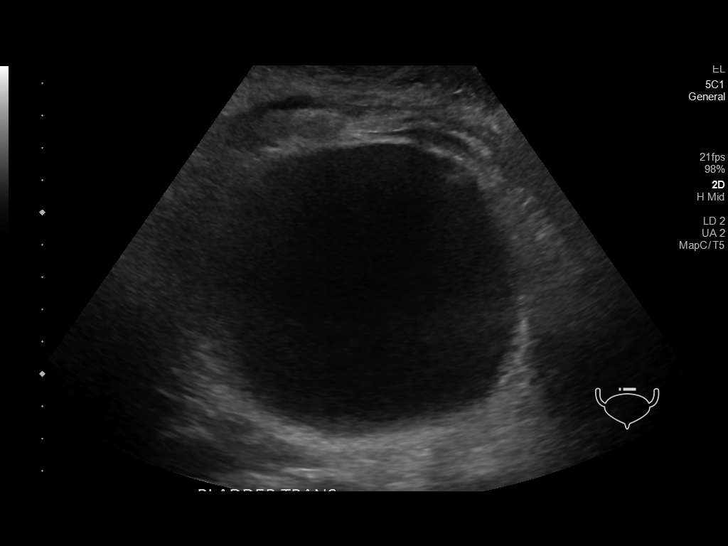

[14 of 25 positions shown; findings below may reference images not displayed]

FINDINGS: Right Kidney:

Renal measurements: 9.5 x 5.1 x 4.4 cm = volume: 114 mL. No renal
masses identified. Moderate to severe right hydronephrosis is seen
which is increased since previous study.

Left Kidney:

Renal measurements: 8.0 x 3.6 x 4.5 cm = volume: 67 mL. No renal
masses identified. Mild-to-moderate left hydronephrosis is seen,
which is increased since previous study.

Bladder:

Urinary bladder is distended but otherwise unremarkable in
appearance.
IMPRESSION: Bilateral hydronephrosis which is increased since prior exam.

Distended urinary bladder. Recommend clinical correlation for
possible urinary retention.
